# Patient Record
Sex: Female | Born: 1935 | Race: White | Hispanic: No | Marital: Married | Smoking: Never smoker
Health system: Southern US, Community
[De-identification: ages and names within clinical notes are randomized; demographics above are authoritative.]

## PROBLEM LIST (undated history)

## (undated) DIAGNOSIS — R58 Hemorrhage, not elsewhere classified: Secondary | ICD-10-CM

## (undated) DIAGNOSIS — Z9981 Dependence on supplemental oxygen: Secondary | ICD-10-CM

## (undated) DIAGNOSIS — R42 Dizziness and giddiness: Secondary | ICD-10-CM

## (undated) DIAGNOSIS — F32A Depression, unspecified: Secondary | ICD-10-CM

## (undated) DIAGNOSIS — K219 Gastro-esophageal reflux disease without esophagitis: Secondary | ICD-10-CM

## (undated) DIAGNOSIS — N189 Chronic kidney disease, unspecified: Secondary | ICD-10-CM

## (undated) DIAGNOSIS — I2510 Atherosclerotic heart disease of native coronary artery without angina pectoris: Secondary | ICD-10-CM

## (undated) DIAGNOSIS — I4892 Unspecified atrial flutter: Secondary | ICD-10-CM

## (undated) DIAGNOSIS — E039 Hypothyroidism, unspecified: Secondary | ICD-10-CM

## (undated) DIAGNOSIS — F329 Major depressive disorder, single episode, unspecified: Secondary | ICD-10-CM

## (undated) DIAGNOSIS — Z79899 Other long term (current) drug therapy: Secondary | ICD-10-CM

## (undated) DIAGNOSIS — D649 Anemia, unspecified: Secondary | ICD-10-CM

## (undated) DIAGNOSIS — I5032 Chronic diastolic (congestive) heart failure: Secondary | ICD-10-CM

## (undated) DIAGNOSIS — I4819 Other persistent atrial fibrillation: Secondary | ICD-10-CM

## (undated) DIAGNOSIS — M1990 Unspecified osteoarthritis, unspecified site: Secondary | ICD-10-CM

## (undated) DIAGNOSIS — J9690 Respiratory failure, unspecified, unspecified whether with hypoxia or hypercapnia: Secondary | ICD-10-CM

## (undated) DIAGNOSIS — I495 Sick sinus syndrome: Secondary | ICD-10-CM

## (undated) DIAGNOSIS — N289 Disorder of kidney and ureter, unspecified: Secondary | ICD-10-CM

## (undated) DIAGNOSIS — I10 Essential (primary) hypertension: Secondary | ICD-10-CM

## (undated) DIAGNOSIS — F419 Anxiety disorder, unspecified: Secondary | ICD-10-CM

## (undated) HISTORY — DX: Anemia, unspecified: D64.9

## (undated) HISTORY — DX: Other persistent atrial fibrillation: I48.19

## (undated) HISTORY — PX: KNEE ARTHROSCOPY: SUR90

## (undated) HISTORY — DX: Unspecified atrial flutter: I48.92

## (undated) HISTORY — PX: SHOULDER SURGERY: SHX246

## (undated) HISTORY — DX: Atherosclerotic heart disease of native coronary artery without angina pectoris: I25.10

## (undated) HISTORY — PX: JOINT REPLACEMENT: SHX530

## (undated) HISTORY — PX: TOTAL HIP ARTHROPLASTY: SHX124

## (undated) HISTORY — DX: Respiratory failure, unspecified, unspecified whether with hypoxia or hypercapnia: J96.90

## (undated) HISTORY — DX: Essential (primary) hypertension: I10

## (undated) HISTORY — DX: Other long term (current) drug therapy: Z79.899

## (undated) HISTORY — PX: PACEMAKER INSERTION: SHX728

## (undated) HISTORY — DX: Unspecified osteoarthritis, unspecified site: M19.90

## (undated) HISTORY — DX: Hemorrhage, not elsewhere classified: R58

## (undated) HISTORY — PX: PACEMAKER GENERATOR CHANGE: SHX5998

## (undated) HISTORY — DX: Sick sinus syndrome: I49.5

## (undated) HISTORY — DX: Depression, unspecified: F32.A

## (undated) HISTORY — DX: Major depressive disorder, single episode, unspecified: F32.9

## (undated) HISTORY — DX: Chronic diastolic (congestive) heart failure: I50.32

## (undated) HISTORY — DX: Dizziness and giddiness: R42

---

## 1997-10-06 ENCOUNTER — Other Ambulatory Visit: Admission: RE | Admit: 1997-10-06 | Discharge: 1997-10-06 | Payer: Self-pay | Admitting: Cardiology

## 1997-10-08 HISTORY — PX: CHOLECYSTECTOMY: SHX55

## 1997-10-09 ENCOUNTER — Inpatient Hospital Stay (HOSPITAL_COMMUNITY): Admission: AD | Admit: 1997-10-09 | Discharge: 1997-10-11 | Payer: Self-pay | Admitting: Cardiology

## 1999-09-06 ENCOUNTER — Encounter: Payer: Self-pay | Admitting: Family Medicine

## 1999-09-06 ENCOUNTER — Encounter: Admission: RE | Admit: 1999-09-06 | Discharge: 1999-09-06 | Payer: Self-pay | Admitting: Family Medicine

## 2000-01-14 ENCOUNTER — Encounter: Payer: Self-pay | Admitting: Interventional Cardiology

## 2000-01-14 ENCOUNTER — Ambulatory Visit (HOSPITAL_COMMUNITY): Admission: RE | Admit: 2000-01-14 | Discharge: 2000-01-14 | Payer: Self-pay | Admitting: Interventional Cardiology

## 2000-01-18 ENCOUNTER — Emergency Department (HOSPITAL_COMMUNITY): Admission: EM | Admit: 2000-01-18 | Discharge: 2000-01-19 | Payer: Self-pay | Admitting: Emergency Medicine

## 2000-09-11 ENCOUNTER — Encounter: Admission: RE | Admit: 2000-09-11 | Discharge: 2000-09-11 | Payer: Self-pay | Admitting: Family Medicine

## 2000-09-11 ENCOUNTER — Encounter: Payer: Self-pay | Admitting: Family Medicine

## 2001-09-17 ENCOUNTER — Encounter: Payer: Self-pay | Admitting: Family Medicine

## 2001-09-17 ENCOUNTER — Encounter: Admission: RE | Admit: 2001-09-17 | Discharge: 2001-09-17 | Payer: Self-pay | Admitting: Family Medicine

## 2002-05-30 ENCOUNTER — Encounter: Admission: RE | Admit: 2002-05-30 | Discharge: 2002-08-28 | Payer: Self-pay | Admitting: Family Medicine

## 2002-09-25 ENCOUNTER — Encounter: Payer: Self-pay | Admitting: Family Medicine

## 2002-09-25 ENCOUNTER — Encounter: Admission: RE | Admit: 2002-09-25 | Discharge: 2002-09-25 | Payer: Self-pay | Admitting: Family Medicine

## 2002-09-25 LAB — HM MAMMOGRAPHY: HM Mammogram: NEGATIVE

## 2003-01-04 ENCOUNTER — Inpatient Hospital Stay (HOSPITAL_COMMUNITY): Admission: EM | Admit: 2003-01-04 | Discharge: 2003-01-13 | Payer: Self-pay | Admitting: Emergency Medicine

## 2003-01-04 ENCOUNTER — Encounter: Payer: Self-pay | Admitting: Anesthesiology

## 2003-01-04 ENCOUNTER — Encounter: Payer: Self-pay | Admitting: Family Medicine

## 2003-01-04 ENCOUNTER — Encounter: Payer: Self-pay | Admitting: Emergency Medicine

## 2003-01-05 ENCOUNTER — Encounter: Payer: Self-pay | Admitting: Surgery

## 2003-05-10 DIAGNOSIS — K683 Retroperitoneal hematoma: Secondary | ICD-10-CM

## 2003-05-10 DIAGNOSIS — R58 Hemorrhage, not elsewhere classified: Secondary | ICD-10-CM

## 2003-05-10 HISTORY — DX: Hemorrhage, not elsewhere classified: R58

## 2003-05-10 HISTORY — DX: Retroperitoneal hematoma: K68.3

## 2003-05-28 ENCOUNTER — Inpatient Hospital Stay (HOSPITAL_COMMUNITY): Admission: AD | Admit: 2003-05-28 | Discharge: 2003-06-02 | Payer: Self-pay | Admitting: Interventional Cardiology

## 2003-07-30 ENCOUNTER — Encounter: Admission: RE | Admit: 2003-07-30 | Discharge: 2003-07-30 | Payer: Self-pay | Admitting: Family Medicine

## 2004-02-16 ENCOUNTER — Encounter: Admission: RE | Admit: 2004-02-16 | Discharge: 2004-02-16 | Payer: Self-pay | Admitting: Gastroenterology

## 2004-04-16 ENCOUNTER — Ambulatory Visit (HOSPITAL_COMMUNITY): Admission: RE | Admit: 2004-04-16 | Discharge: 2004-04-16 | Payer: Self-pay | Admitting: Gastroenterology

## 2005-07-06 ENCOUNTER — Encounter: Admission: RE | Admit: 2005-07-06 | Discharge: 2005-07-06 | Payer: Self-pay | Admitting: Family Medicine

## 2005-10-10 ENCOUNTER — Ambulatory Visit: Payer: Self-pay | Admitting: Family Medicine

## 2005-10-25 ENCOUNTER — Inpatient Hospital Stay (HOSPITAL_COMMUNITY): Admission: RE | Admit: 2005-10-25 | Discharge: 2005-10-28 | Payer: Self-pay | Admitting: Orthopedic Surgery

## 2006-02-14 ENCOUNTER — Ambulatory Visit: Payer: Self-pay | Admitting: Family Medicine

## 2006-10-17 ENCOUNTER — Ambulatory Visit (HOSPITAL_COMMUNITY): Admission: RE | Admit: 2006-10-17 | Discharge: 2006-10-17 | Payer: Self-pay | Admitting: Cardiology

## 2006-10-25 ENCOUNTER — Ambulatory Visit: Payer: Self-pay | Admitting: Family Medicine

## 2006-11-21 ENCOUNTER — Inpatient Hospital Stay (HOSPITAL_COMMUNITY): Admission: EM | Admit: 2006-11-21 | Discharge: 2006-11-29 | Payer: Self-pay | Admitting: Emergency Medicine

## 2006-11-27 ENCOUNTER — Ambulatory Visit: Payer: Self-pay | Admitting: Physical Medicine & Rehabilitation

## 2006-11-27 ENCOUNTER — Encounter (INDEPENDENT_AMBULATORY_CARE_PROVIDER_SITE_OTHER): Payer: Self-pay | Admitting: General Surgery

## 2006-11-29 ENCOUNTER — Inpatient Hospital Stay (HOSPITAL_COMMUNITY)
Admission: RE | Admit: 2006-11-29 | Discharge: 2006-12-13 | Payer: Self-pay | Admitting: Physical Medicine & Rehabilitation

## 2006-12-23 ENCOUNTER — Inpatient Hospital Stay (HOSPITAL_COMMUNITY): Admission: RE | Admit: 2006-12-23 | Discharge: 2006-12-25 | Payer: Self-pay | Admitting: Orthopaedic Surgery

## 2007-01-26 ENCOUNTER — Ambulatory Visit (HOSPITAL_COMMUNITY): Admission: RE | Admit: 2007-01-26 | Discharge: 2007-01-26 | Payer: Self-pay | Admitting: Orthopaedic Surgery

## 2007-03-27 ENCOUNTER — Encounter: Admission: RE | Admit: 2007-03-27 | Discharge: 2007-03-27 | Payer: Self-pay | Admitting: Interventional Cardiology

## 2007-06-08 ENCOUNTER — Ambulatory Visit: Payer: Self-pay | Admitting: Family Medicine

## 2007-09-25 ENCOUNTER — Ambulatory Visit: Payer: Self-pay | Admitting: Family Medicine

## 2007-09-26 ENCOUNTER — Encounter: Admission: RE | Admit: 2007-09-26 | Discharge: 2007-09-26 | Payer: Self-pay | Admitting: Pediatrics

## 2008-01-15 IMAGING — CR DG CHEST 2V
2 series · 2 of 2 positions shown · non-contrast
Comparison: 12/11/2006

Addendum Begins
The patient is noted to have a CT scan of the chest of 11/21/2006. The lungs were
relatively clear that time. Given the essentially clear lungs at that time, the
findings on today's study are now felt to be more likely related to sequelae of
patchy infectious or inflammatory airspace disease. There is no substantial
change since 12/03/2006.
Addendum Ends
CLINICAL DATA: Preop evaluation for right patella surgery

[view not recorded (1 of 2)]
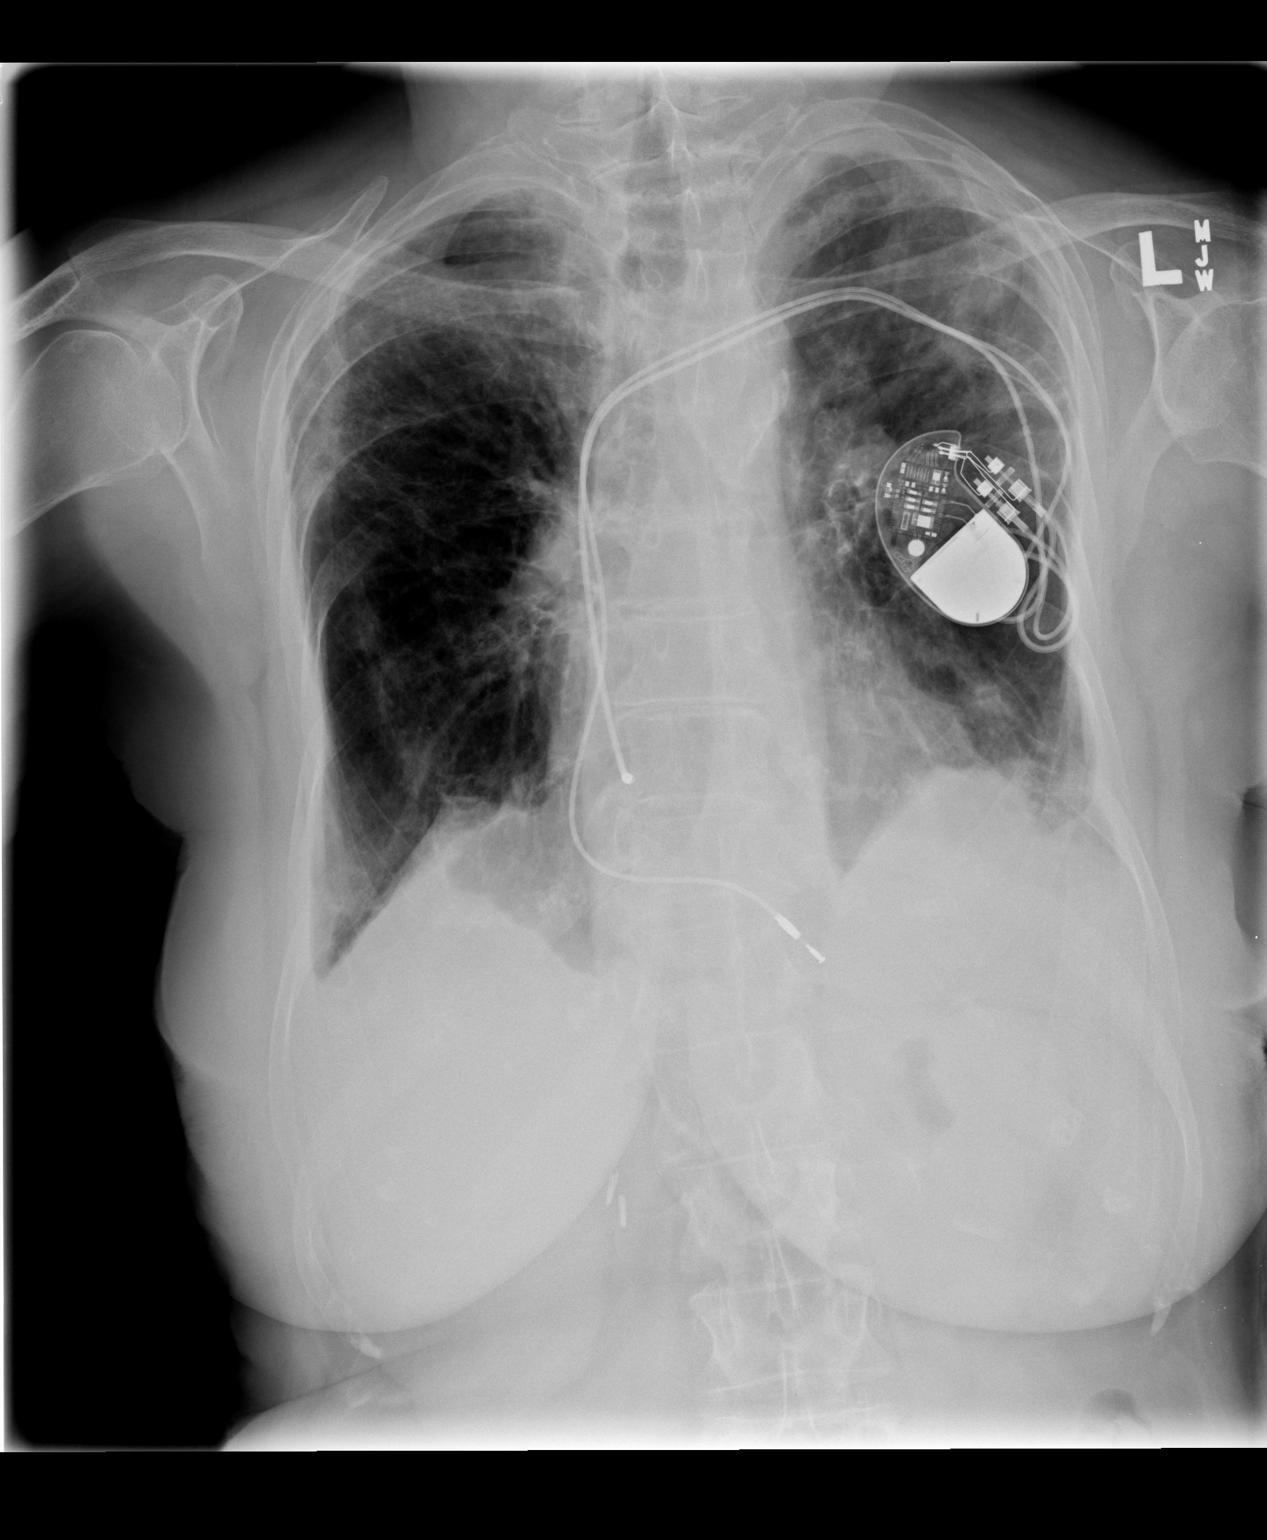

[view not recorded (2 of 2)]
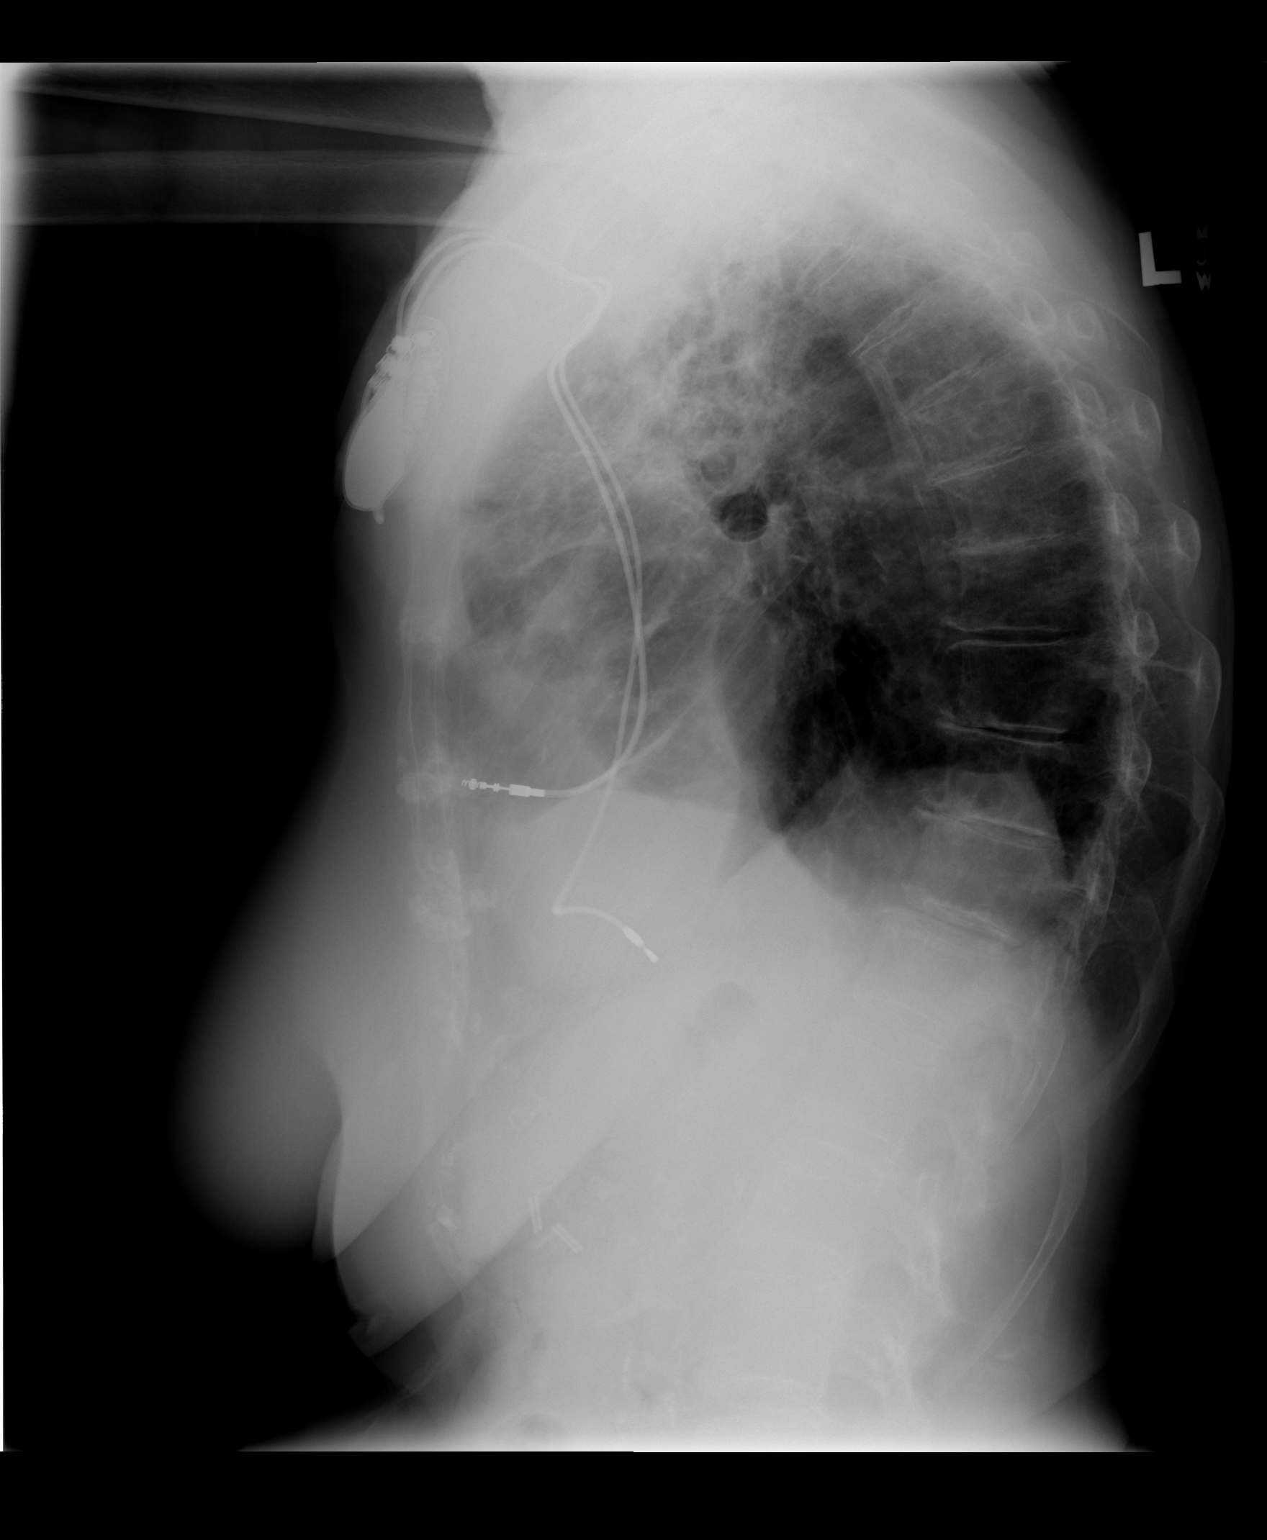

[2 of 2 positions shown; findings below may reference images not displayed]

CHEST - 2 VIEW:

Two view chest shows hyperexpansion with diffuse chronic interstitial and
pleural-parenchymal opacity. Given the relative stability of these changes since
12/03/2006, the bulk of the opacity is probably related to chronic interstitial
and pleural-parenchymal scarring. Superimposed infiltrate cannot be excluded.
The left permanent pacemaker remains in place.
IMPRESSION: Emphysema with pleural-parenchymal scarring bilaterally. No definite focal
infiltrate.

## 2008-09-16 ENCOUNTER — Ambulatory Visit: Payer: Self-pay | Admitting: Family Medicine

## 2008-10-31 ENCOUNTER — Ambulatory Visit: Payer: Self-pay | Admitting: Family Medicine

## 2008-11-21 ENCOUNTER — Ambulatory Visit: Payer: Self-pay | Admitting: Family Medicine

## 2009-06-09 ENCOUNTER — Ambulatory Visit: Payer: Self-pay | Admitting: Family Medicine

## 2009-08-27 ENCOUNTER — Ambulatory Visit: Payer: Self-pay | Admitting: Family Medicine

## 2010-01-14 ENCOUNTER — Ambulatory Visit: Payer: Self-pay | Admitting: Family Medicine

## 2010-04-24 ENCOUNTER — Emergency Department (HOSPITAL_COMMUNITY)
Admission: EM | Admit: 2010-04-24 | Discharge: 2010-04-24 | Payer: Self-pay | Source: Home / Self Care | Admitting: Emergency Medicine

## 2010-04-30 ENCOUNTER — Ambulatory Visit (HOSPITAL_COMMUNITY)
Admission: RE | Admit: 2010-04-30 | Discharge: 2010-05-01 | Payer: Self-pay | Source: Home / Self Care | Attending: Orthopedic Surgery | Admitting: Orthopedic Surgery

## 2010-05-30 ENCOUNTER — Encounter: Payer: Self-pay | Admitting: Family Medicine
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
## 2010-07-19 LAB — COMPREHENSIVE METABOLIC PANEL
AST: 176 U/L — ABNORMAL HIGH (ref 0–37)
Albumin: 3.2 g/dL — ABNORMAL LOW (ref 3.5–5.2)
BUN: 45 mg/dL — ABNORMAL HIGH (ref 6–23)
Calcium: 8.9 mg/dL (ref 8.4–10.5)
Creatinine, Ser: 2.09 mg/dL — ABNORMAL HIGH (ref 0.4–1.2)
GFR calc Af Amer: 27 mL/min — ABNORMAL LOW (ref >60)
Total Bilirubin: 0.7 mg/dL (ref 0.3–1.2)
Total Protein: 6.3 g/dL (ref 6.0–8.3)

## 2010-07-19 LAB — URINALYSIS, ROUTINE W REFLEX MICROSCOPIC
Glucose, UA: NEGATIVE mg/dL
Ketones, ur: NEGATIVE mg/dL
Protein, ur: NEGATIVE mg/dL
Urobilinogen, UA: 0.2 mg/dL (ref 0.0–1.0)

## 2010-07-19 LAB — HEPATIC FUNCTION PANEL
ALT: 160 U/L — ABNORMAL HIGH (ref 0–35)
Indirect Bilirubin: 0.6 mg/dL (ref 0.3–0.9)
Total Protein: 5.8 g/dL — ABNORMAL LOW (ref 6.0–8.3)

## 2010-07-19 LAB — CBC
MCH: 30.7 pg (ref 26.0–34.0)
MCHC: 33.2 g/dL (ref 30.0–36.0)
MCV: 92.3 fL (ref 78.0–100.0)
Platelets: 278 10*3/uL (ref 150–400)
RBC: 3.75 MIL/uL — ABNORMAL LOW (ref 3.87–5.11)
RDW: 16 % — ABNORMAL HIGH (ref 11.5–15.5)

## 2010-07-19 LAB — PROTIME-INR: INR: 1.12 (ref 0.00–1.49)

## 2010-07-19 LAB — SURGICAL PCR SCREEN: Staphylococcus aureus: NEGATIVE

## 2010-07-19 LAB — APTT: aPTT: 30 seconds (ref 24–37)

## 2010-07-19 LAB — URINE MICROSCOPIC-ADD ON

## 2010-07-20 ENCOUNTER — Ambulatory Visit (INDEPENDENT_AMBULATORY_CARE_PROVIDER_SITE_OTHER): Payer: MEDICARE | Admitting: Family Medicine

## 2010-07-20 DIAGNOSIS — R5381 Other malaise: Secondary | ICD-10-CM

## 2010-07-20 DIAGNOSIS — E039 Hypothyroidism, unspecified: Secondary | ICD-10-CM

## 2010-07-20 DIAGNOSIS — Z79899 Other long term (current) drug therapy: Secondary | ICD-10-CM

## 2010-07-20 DIAGNOSIS — N390 Urinary tract infection, site not specified: Secondary | ICD-10-CM

## 2010-07-29 ENCOUNTER — Other Ambulatory Visit (INDEPENDENT_AMBULATORY_CARE_PROVIDER_SITE_OTHER): Payer: MEDICARE

## 2010-07-29 DIAGNOSIS — N390 Urinary tract infection, site not specified: Secondary | ICD-10-CM

## 2010-08-09 ENCOUNTER — Ambulatory Visit (INDEPENDENT_AMBULATORY_CARE_PROVIDER_SITE_OTHER): Payer: MEDICARE | Admitting: Family Medicine

## 2010-08-09 DIAGNOSIS — R5383 Other fatigue: Secondary | ICD-10-CM

## 2010-08-09 DIAGNOSIS — R748 Abnormal levels of other serum enzymes: Secondary | ICD-10-CM

## 2010-09-13 ENCOUNTER — Institutional Professional Consult (permissible substitution) (INDEPENDENT_AMBULATORY_CARE_PROVIDER_SITE_OTHER): Payer: MEDICARE | Admitting: Family Medicine

## 2010-09-13 DIAGNOSIS — R42 Dizziness and giddiness: Secondary | ICD-10-CM

## 2010-09-13 DIAGNOSIS — I10 Essential (primary) hypertension: Secondary | ICD-10-CM

## 2010-09-13 DIAGNOSIS — Z79899 Other long term (current) drug therapy: Secondary | ICD-10-CM

## 2010-09-13 DIAGNOSIS — R5383 Other fatigue: Secondary | ICD-10-CM
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
## 2010-09-21 NOTE — Op Note (Signed)
[name], [name]              ACCOUNT NO.:  [id number]   MEDICAL RECORD NO.:  [id number]          PATIENT TYPE:  OIB   LOCATION:  2899                         FACILITY:  [facility]   PHYSICIAN:  Francisca December, M.D.  DATE OF BIRTH:  1919-09-03   DATE OF PROCEDURE:  10/17/2006  DATE OF DISCHARGE:                               OPERATIVE REPORT   PROCEDURE PERFORMED:  1. Explant old pacing generator.  2. Reimplant new dual chamber pacemaker.  3. Pacemaker lead threshold testing.   INDICATION:  ERI on a St. Jude model number [id number] implanted October 18, 1997.   PROCEDURE NOTE:  The patient is brought to the cardiac catheterization  laboratory where the left prepectoral region was prepped and draped in  the usual sterile fashion.  Local anesthesia was obtained with  infiltration of 1% lidocaine with epinephrine throughout the prepectoral  region.  A 6-7 cm incision was then made over the old surgical site.  This was carried down by sharp and blunt dissection to the pacemaker  capsule.  This was incised and the pacemaker was delivered without  extensive difficulty.  The leads were detached from the pacing  generator.  Each lead was tested for adequate pacing parameters and this  will be reported below.   The pocket was then copiously irrigated using 1% kanamycin solution.  The leads were then attached to the new pacing generator, carefully  identifying each by its serial number, and placing each into the  appropriate receptacle.  Each lead was tightened into place and tested  for security.  The pacing generator was then replaced in the pocket and  an 0 silk anchoring suture applied.  The wound was then closed using 2-0  Vicryl in a running fashion for the subcutaneous layer.  The skin was  approximated using 4-0 Vicryl in a running subcuticular fashion.  Steri-  Strips and a sterile dressing were applied.  The patient was transported  to the recovery area in stable condition.   EQUIPMENT DATA:  The new pacing generator is a [employer]. Jude Zephyr XL DR,  model number [id number], serial number [id number].   PACING DATA:  The ventricular lead detected a 2.2 mV R wave.  The pacing  threshold was 0.8 volts at 0.5 milliseconds pulse width.  The impedance  was 530 ohms resulting in a current at capture threshold of 1.5 MA.  The  atrial lead detected a 2.1 mV P wave.  The pacing threshold 0.8 volts at  0.5 milliseconds pulse width.  The impedance was 373 ohms resulting in a  current at capture threshold of 2.1 MA.      Francisca December, M.D.  Electronically Signed     JHE/MEDQ  D:  10/17/2006  T:  10/17/2006  Job:  [id number]
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
## 2010-09-23 ENCOUNTER — Telehealth: Payer: Self-pay | Admitting: Family Medicine
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
## 2010-09-24 NOTE — Op Note (Signed)
[name], [name]              ACCOUNT NO.:  [id number]   MEDICAL RECORD NO.:  [id number]          PATIENT TYPE:  INP   LOCATION:  0009                         FACILITY:  [facility]   PHYSICIAN:  Danielle Frankel. Charlann Melton, M.D.  DATE OF BIRTH:  30-Oct-1919   DATE OF PROCEDURE:  10/25/2005  DATE OF DISCHARGE:                                 OPERATIVE REPORT   PREOPERATIVE DIAGNOSIS:  Right hip osteoarthritis.   POSTOPERATIVE DIAGNOSIS:  Right hip osteoarthritis.   PROCEDURE:  Right total hip replacement.   COMPONENTS USED:  A DePuy hybrid total hip replacement with a size 54  Pinnacle cup, 32 neutral Marathon liner, a size 4 Summit cemented stem with  a cement centralizer and cement restrictor placed deep with a 32 +5 ball.   SURGEON:  Danielle Frankel. Charlann Melton, M.D.   ASSISTANT:  Georges Lynch. Darrelyn Hillock, M.D.   ANESTHESIA:  General.   BLOOD LOSS:  300.   DRAINS:  Drains x1.   COMPLICATIONS:  None.   INDICATION FOR PROCEDURE:  [name] is a very pleasant 77 year old  female referred for surgical consideration of the right hip pain.  Radiographs revealed end-stage degenerative with severe osteophytes present  over the inferior neck as well as the medial aspect of the acetabulum.  Her  pain had progressed to the point where no conservative measures provided any  relief.  Medical clearance was obtained in order to improve quality of life.  Consent was obtained after reviewing risks of infection, DVT, dislocation,  component failure, need for revision surgery, or neurovascular injury.   PROCEDURE IN DETAIL:  The patient was brought to the operative theater.  Once adequate anesthesia and preoperative antibiotics, 1 gram of Ancef, were  administered, the patient was positioned in the left lateral decubitus  position with the right side up.  The right lower extremity was then prepped  and draped in a sterile fashion following a pre-scrub.  A lateral-based  incision was made for posterior approach to  the hip.  Short external  rotators were taken down separate from the posterior capsule; the capsule  was saved for later repair.  The hip was dislocated and noted to have severe  osteoarthritis with significant osteophytes off the inferior aspect and  neck.  Using a hip guide based off the tip of the trochanter, neck osteotomy  was made.  The patient's center of head was noted to be close of the tip of  the trochanter.  Femoral preparation was carried out first to determine how  much anteversion could be placed with a cemented stem.  I prepared the canal  with initial hand canal T-handle reaming, irrigation for fat emboli followed  by lateralized reaming.  I then broached up to a 4 without complication.  Approximately 25 degrees of anteversion was placed.   Following this, a cement restrictor was placed deep and measured off the  broach.  I then went to the acetabulum.  Acetabulum preparation was carried  out following the labrectomy.  I reamed initially through the medial wall  osteophyte down to the medial wall.  I then reamed up  sequentially to a 53  reamer, which had excellent purchase and bony bed preparation.  I then  impacted a 54 Pinnacle cup at 35 degrees of abduction and 20 degrees of  forward flexion.  Two cancellous bone screws were placed to help secure this  initial scratch fit.  Trial cup was positioned, a 32 neutral.  Attention was  then redirected back to the femur, where a trial component was placed.  I  then did a trial reduction and was very happy with the stability.  There was  perhaps a little bit of laxity in extension and for that reason, I trialed  the +5 ball.  Hip stability was excellent with flexion and internal  rotation, extension and external rotation, as well as in the sleep position.  Leg lengths appeared equal to the down leg as compared to the preoperative  state prior to the patient being draped.   Following this, the trial components were removed.  The  final acetabular  liner was positioned based on the stability and the component position.  I  then prepared the canal for third generation cement technique with pulse  lavage irrigation and brushing.  The canal was then packed while cement was  mixed.  Once the cement was mixed, the femoral component was positioned in  25 degrees of anteversion, holding it out of any potential varus.  Once the  cement had cured, excessive cement was debrided and I retrialed the  components and felt very good with a +5 ball.  The final 32 +5 ball was  impacted on the cleaned trunnion and the hip reduced.  The hip was irrigated  throughout the case and at this point, any hemostasis necessary was carried  out this point.  The posterior capsular tissue was reapproximated to the  superior leaflet with #1 Ethibond and the remainder of the wound was closed  in layers with #1 Vicryl in the iliotibial band and gluteus fascia.  Then a  4-0 Monocryl was used on the skin.  Note that a Hemovac drain was placed  deep.  The patient then had her hip cleaned, dried and dressed sterilely  with Steri-Strips, dressing sponges and tape.  She was awoken from  anesthesia and brought to recovery room in stable condition.      [name], M.D.  Electronically Signed     MDO/MEDQ  D:  10/25/2005  T:  10/26/2005  Job:  [id number]

## 2010-09-24 NOTE — Op Note (Signed)
[name], [name]              ACCOUNT NO.:  [id number]   MEDICAL RECORD NO.:  [id number]          PATIENT TYPE:  AMB   LOCATION:  ENDO                         FACILITY:  [facility]   PHYSICIAN:  Anselmo Rod, M.D.  DATE OF BIRTH:  1919-11-28   DATE OF PROCEDURE:  04/16/2004  DATE OF DISCHARGE:                                 OPERATIVE REPORT   PROCEDURE:  Screening colonoscopy.   ENDOSCOPIST:  Charna Elizabeth, M.D.   INSTRUMENT USED:  Olympus video colonoscope (adjustable pediatric scope).   INDICATIONS FOR PROCEDURE:  77 year old white female with a history of  abdominal pain, occasional BRBPR, undergoing screening colonoscopy.  The  patient has a history of atrial fibrillation and has been taken off her  Coumadin since she had spontaneous mesenteric bleed resulting in  hemoperitoneum for which surgery was done in August 2004.  Rule out colonic  polyps, masses, etc.   PREPROCEDURE PREPARATION:  Informed consent was procured from the patient.  The patient was fasted for eight hours prior to the procedure and prepped  with a bottle of magnesium citrate and a gallon of GoLYTELY the night prior  to the procedure.  The risks and benefits of the procedure including a 10%  missed rate of cancer and polyps were discussed with the patient, as well.   PREPROCEDURE PHYSICAL:  Patient with stable vital signs.  Neck supple.  Chest clear to auscultation.  S1 and S2 regular (paced beats).  Pacemaker  present on the anterior aspect of the chest.  Abdomen soft with normal bowel  sounds.  Surgical scars present.  No hepatosplenomegaly appreciated.   DESCRIPTION OF PROCEDURE:  The patient was placed in the left lateral  decubitus position, sedated with 40 mg of Demerol and 5 mg Versed in slow  incremental doses.  Once the patient was adequately sedated, maintained on  low flow oxygen and continuous cardiac monitoring, the Olympus video  colonoscope was advanced from the rectum to the cecum.  The  appendiceal  orifice and ileocecal valve were clearly visualized and photographed.  Early  sigmoid diverticulum was seen.  Small internal hemorrhoids were seen on  retroflexion.  No masses or polyps were identified.  The patient tolerated  the procedure well without without complications.   IMPRESSION:  1.  Early sigmoid diverticulosis.  2.  Small internal hemorrhoids.  3.  No masses or polyps seen.   RECOMMENDATIONS:  1.  Continue a high fiber diet with liberal fluid intake.  2.  Brochures on diverticulosis have been given to the patient for      education.  3.  Outpatient follow up as need arises in the future.      Jyot   JNM/MEDQ  D:  04/16/2004  T:  04/16/2004  Job:  [id number]   cc:   Lyn Records III, M.D.  [address]  [address]  [place]  [place] [address]  Fax: [phone]

## 2010-09-24 NOTE — Discharge Summary (Signed)
[name], [name]                        ACCOUNT NO.:  [id number]   MEDICAL RECORD NO.:  [id number]                   PATIENT TYPE:  INP   LOCATION:  4730                                 FACILITY:  [facility]   PHYSICIAN:  Lyn Records, M.D.                DATE OF BIRTH:  1919/06/12   DATE OF ADMISSION:  05/28/2003  DATE OF DISCHARGE:  06/02/2003                                 DISCHARGE SUMMARY   PRIMARY CARE PHYSICIAN:  Sharlot Gowda, M.D.   HISTORY OF PRESENT ILLNESS:  The patient is an 77 year old female with  history of atrial fibrillation, recently on sotalol with maintenance of  sinus rhythm for about six months, previously on Coumadin, but this  medication had to be discontinued in August of 2004 secondary to life  threatening spontaneous mesenteric bleed requiring packed red blood cells.  She also underwent exploratory laparotomy and no significant GI  abnormalities were found during this time.  Since November of 2004, she has  been experiencing symptoms of weakness and lightheadedness initially thought  to be due to Lotrel.  This dosage was decreased and she continued to  experience intermittent symptoms of the same weakness and lightheadedness.  By early January these symptoms had become more pronounced with dizziness,  weakness, near syncope, more severe lightheadedness, heaviness in the legs,  and excessive fatigue.  There was some concern that these symptoms were  secondary to sotalol so this was decreased and subsequently discontinued.  Five days later, the patient developed palpitations, EKG was checked and the  patient was found to be in atrial fibrillation.  Based on the above, the  patient has been admitted for amiodarone loading.  On admission, the  patient's blood pressure was normal at 122/70, heart rate was 80 and  regular. Examination otherwise was unremarkable except for rate of her  heartbeat.  Labs and diagnostics were pending at time of initial note.   An  EKG showed atrial fibrillation with controlled ventricular rate.   ADMISSION DIAGNOSIS:  1. Atrial fibrillation with controlled ventricular rate, presumed     intolerance to sotalol.  2. Amiodarone loading.  3. Hypertension.  4. History of permanent pacemaker secondary to sick sinus syndrome and     tachy/brady syndrome.   HOSPITAL COURSE:  Problem 1.  The patient was admitted to the telemetry unit  where TSH, routine labs, pulmonary function studies were started.  She was  started on amiodarone 400 mg p.o. q.8h with daily EKG's.  QTC's were  monitored daily and remained less than 500 milliseconds.  The patient  remained in atrial fibrillation with good rate control throughout the  hospitalization.  Dr. Katrinka Blazing was concerned that if she should convert, she  was at increased risk for CVA and although, she had a bleed on Coumadin, he  felt it best to go ahead and at least initiate IV heparin. This could be  rapidly reversed if  any problems with bleeding did occur.  After about 36  hours, the patient remained in atrial fibrillation and there was some  concern that she would not convert with amiodarone, therefore, Dr. Katrinka Blazing  opted to place the patient on the cardioversion schedule for Monday, June 02, 2003.  Fortunately, the patient converted to normal sinus rhythm over  the weekend, so the cardioversion was canceled and the patient was  discharged on Monday, June 02, 2003.   FINAL DIAGNOSES:  1. Atrial fibrillation with controlled ventricular rate, now converted to     sinus rhythm.  2. History of sick sinus syndrome and tachy/brady syndrome, status post     permanent pacemaker.  3. Continue weakness and feeling bad, unclear etiology.  4. Hypertension.  5. History of spontaneous mesenteric bleed in 2004 secondary to Coumadin     therapy.   DISCHARGE MEDICATIONS:  1. Amiodarone 200 mg one twice a day until June 16, 2003, and one once a     day.  2. Lotrel 5/10  decreased to one per day.  3. Atenolol 50 mg daily.  4. Enteric-coated aspirin 81 mg daily.  5. Hydrochlorothiazide 12.5 mg daily.  6. Ativan 0.5 mg 1/2 tablet twice daily p.r.n. for anxiety.   ACTIVITY:  As tolerated.   DIET:  Low salt.   DISCHARGE INSTRUCTIONS:  Continue as before, but Dr. Katrinka Blazing has written on  the discharge sheet that she should consider a geriatric assessment at [facility] and that our office will call with information  regarding this type of appointment.  Otherwise, she is to follow up with Dr.  Katrinka Blazing with one month. She has an appointment scheduled for Monday, February  21, at 1:45 p.m.      Allison L. Rennis Harding, N.P.                    Lyn Records, M.D.    ALE/MEDQ  D:  06/24/2003  T:  06/24/2003  Job:  [id number]

## 2010-09-24 NOTE — Op Note (Signed)
   NAME:  [name], [name]                        ACCOUNT NO.:  [id number]   MEDICAL RECORD NO.:  [id number]                   PATIENT TYPE:  INP   LOCATION:  1823                                 FACILITY:  [facility]   PHYSICIAN:  Thornton Park. Daphine Deutscher, M.D.             DATE OF BIRTH:  [birth year]   DATE OF PROCEDURE:  01/04/2003  DATE OF DISCHARGE:                                 OPERATIVE REPORT   PREOPERATIVE DIAGNOSIS:  Hemoperitoneum and shock.   POSTOPERATIVE DIAGNOSIS:  Hemoperitoneum and shock from spontaneous  mesenteric bleed, probably secondary to anticoagulation.   SURGEON:  Thornton Park. Daphine Deutscher, M.D.   ASSISTANT:  Sandria Bales. Ezzard Standing, M.D.   INDICATIONS:  [name] is an 77 year old lady who was seen in the [facility]  emergency department with a CT scan which I reviewed and showed a  hemoperitoneum and the patient was in shock.  Informed consent was obtained  regarding laparotomy.   DESCRIPTION OF PROCEDURE:  The patient was taken to Room #16 and abdomen was  prepped with Betadine and draped sterilely.  Midline incision was made and  encountered marked hemoperitoneum.  Sucker was inserted and two liters of  blood withdrawn as well as clots.  The focus seemed to be in the retro,  ascending, hepatic flexure region of the colon, around the duodenum, up in  the mesentery.  I explored this area and evacuated the clot.  There was no  active bleeder noted at that point.  She had received fresh frozen plasma  and presumably her INR had been corrected at that point.  Blood was  evacuated.  I ran her small bowel and palpated her colon, looked at her  spleen, looked at her liver.  An NG tube was left in her stomach.  The  mesentery that had been torn open during the hemorrhage was oversewn with  running 3-0 Vicryl.   The wound was closed with running #1 PDS from above and below.  A single  middle retention suture was placed.  The patient seemed to tolerate the  procedure well.                                       Thornton Park Daphine Deutscher, M.D.    MBM/MEDQ  D:  01/04/2003  T:  01/05/2003  Job:  [id number]   cc:   Lyn Records III, M.D.  [address]  [address]  [place]  [place] [address]  Fax: [phone]   Sharlot Gowda, M.D.  [address] [place], [place] [address]  Fax: [phone]

## 2010-09-24 NOTE — H&P (Signed)
NAMEELLIE, BRYAND              ACCOUNT NO.:  [id number]   MEDICAL RECORD NO.:  [id number]          PATIENT TYPE:  INP   LOCATION:  NA                           FACILITY:  [facility]   PHYSICIAN:  Madlyn Frankel. Charlann Boxer, M.D.  DATE OF BIRTH:  January 19, 1920   DATE OF ADMISSION:  DATE OF DISCHARGE:                                HISTORY & PHYSICAL   CHIEF COMPLAINT:  Pain in my right hip.   HISTORY OF PRESENT ILLNESS:  This 77 year old female seen by [place] for  continuing problems concerning her right hip. She has had progressive pain  which has been resistant to conservative care. She now has difficulty with  getting about due to pain in the proximal aspect of her thigh. Examination  observed Trendelenburg gait on the right side, favoring the lower extremity.  Shortening and abductor weakness was also noted as well. Limitation of range  of motion is seen into the right hip. X-rays have show loss of joint space  with collapse of the femoral head and severe osteophyte formation with near  fusion of the right hip. This is a very active lady and is highly desirous  to continue her active lifestyle. After much discussion including the risks  and benefits of surgery, we decided this patient would benefit from surgical  intervention, being admitted for a total hip replacement arthroplasty. In  all probability, she will need to have home health versus rehabilitation and  skilled nursing, and we will certainly determine that at the end of surgery.  Should we have any medical problems, we will certainly call Dr. Susann Givens who  kindly referred this patient to [place] for any medication expertise.   ALLERGIES:  THE PATIENT IS ALLERGIC TO PROCAN, QUINIDINE, TIAZAC, PHENERGAN  AND HAS HAD A HISTORY OF A SEVERE GI BLEED WHILE ON COUMADIN IN THE PAST.   CURRENT MEDICATIONS:  1.  Lotrel 5/10 mg 1 daily.  2.  Amiodarone 200 mg daily.  3.  Atenolol 50 mg daily.  4.  Hydrochlorothiazide 12.5 mg daily.  5.  Mirtazapine 15  mg daily.  6.  Aspirin 81 mg daily (will stop prior to surgery).  7.  Folic acid  mg daily.  8.  Calcium.   PAST MEDICAL HISTORY:  She is being treated by Dr. Susann Givens for hypertension.  She has a history of atrial fibrillation and has a pacemaker. She has had  basal cell carcinomas removed from her skin.   PAST SURGICAL HISTORY:  Cardiac pacemaker placed in 1999, cholecystectomy in  the past, and had emergency abdominal surgery secondary to being given  Coumadin and a severe GI bleed in 2004.   FAMILY HISTORY:  Positive for heart disease and hypertension in her mother.   SOCIAL HISTORY:  The patient is married. She is a housewife. No intake of  alcohol or tobacco products. She has three children, and her husband and two  daughters will be care givers after surgery. She lives in a [place] home.   REVIEW OF SYSTEMS:  CENTRAL NERVOUS SYSTEM:  No seizures, stroke, paralysis,  numbness, double vision.  RESPIRATORY:  No  productive cough, no hemoptysis,  no shortness of breath. CARDIOVASCULAR:  No chest pain, no angina, no  orthopnea. GASTROINTESTINAL:  No nausea, vomiting, melena, or bloody stools.  GENITOURINARY:  No discharge, dysuria, hematuria. MUSCULOSKELETAL:  Primarily in present illness.   PHYSICAL EXAMINATION:  GENERAL:  Alert and cooperative, friendly, 85-year-  old white female whose vital signs are:  Blood pressure 120/62, pulse 80,  respirations 12.  HEENT:  Normocephalic. PERRLA. EOMs intact. Oropharynx is clear.  CHEST:  Clear to auscultation. No rhonchi, no rales.  HEART:  Regular rate and rhythm. No murmurs are heard.  ABDOMEN:  Soft, nontender. Liver and spleen not felt.  GENITAL, RECTAL, PELVIC, BREAST:  Not done, not pertinent to present  illness.  EXTREMITIES:  Right hip as in present illness above.   ADMISSION DIAGNOSES:  1.  Severe osteoarthritis of the right hip.  2.  Hypertension.  3.  History of atrial fibrillation.   PLAN:  The patient will be admitted  for right total hip replacement  arthroplasty. Again, will ask Dr. Sharlot Gowda to follow along with [place]  should this patient have any medical problems.      Dooley L. Cherlynn June.      Madlyn Frankel Charlann Boxer, M.D.  Electronically Signed    DLU/MEDQ  D:  10/18/2005  T:  10/18/2005  Job:  [id number]   cc:   Sharlot Gowda, M.D.  Fax: [phone]

## 2010-09-24 NOTE — Discharge Summary (Signed)
[name], [name]              ACCOUNT NO.:  [id number]   MEDICAL RECORD NO.:  [id number]          PATIENT TYPE:  INP   LOCATION:  1615                         FACILITY:  [facility]   PHYSICIAN:  Madlyn Frankel. Charlann Boxer, M.D.  DATE OF BIRTH:  Feb 07, 1920   DATE OF ADMISSION:  10/25/2005  DATE OF DISCHARGE:  10/28/2005                                 DISCHARGE SUMMARY   ADMISSION DIAGNOSES:  1.  Severe osteoarthritis of right hip.  2.  Hypertension.  3.  History of atrial fibrillation.   DISCHARGE DIAGNOSES:  1.  Severe osteoarthritis of right hip.  2.  Hypertension.  3.  History of atrial fibrillation.  4.  Mild postoperative anemia.   OPERATION:  On  October 25, 2005, the patient underwent DePuy hybrid total hip  replacement arthroplasty to the right hip, [name], M.D., assisted.   BRIEF HISTORY:  This 77 year old lady with continuing problems concerning  her right hip.  X-rays showed end-stage degenerative arthritis to the hip  with bone-on-bone deformity.  Conservative measures did not provide her with  any relief and so after much consideration, including the risks and benefits  of surgery, she agreed to the above procedure.   COURSE IN THE HOSPITAL:  The patient tolerated surgical procedure quite  well.  She was allowed weightbearing as tolerated on her operative hip as  the stem was cemented.  Home arrangements had been made with [place] and her  daughter to take care of her and she was anxious to go home the day of  discharge.  She was ambulating at least 80 feet in the hall with little or  no problems.  Wound was dry.  Neurovascular is intact in the operative  extremity.  She felt she could be maintained in home environments and  arrangements were made for discharge.  Laboratory values in the hospital  hematologically showed a CBC with differential completely normal  preoperatively.  Hemoglobin was 13.4, hematocrit 39.8.  Final hemoglobin of  8.6, hematocrit was 25  (asymptomatic).  Blood chemistries remained normal.  Urinalysis negative for urinary tract infection.  No chest x-ray or  electrocardiogram seen in this chart.   CONDITION ON DISCHARGE:  Improved, stable.   PLAN:  The patient discharged to home.  Continue with home medications and  diet.  Follow her medical doctor as antacids as indicated.  She is to return  to see Dr. Charlann Boxer in two weeks after date of surgery.  [name] is given for  discomfort, Coumadin protocol, [name] for a postoperative anemia, and  continue with incentive spirometer home.  She is to follow with Dr. Susann Givens  per his instructions and take Tylenol for temperature over 100.      Dooley L. Cherlynn June.      Madlyn Frankel Charlann Boxer, M.D.  Electronically Signed    DLU/MEDQ  D:  11/18/2005  T:  11/18/2005  Job:  [id number]   cc:   Sharlot Gowda, M.D.  Fax: [phone]

## 2010-09-24 NOTE — Discharge Summary (Signed)
   [name], [name]                        ACCOUNT NO.:  [id number]   MEDICAL RECORD NO.:  [id number]                   PATIENT TYPE:  INP   LOCATION:  3712                                 FACILITY:  [facility]   PHYSICIAN:  Thornton Park. Daphine Deutscher, M.D.             DATE OF BIRTH:  11-23-1919   DATE OF ADMISSION:  01/04/2003  DATE OF DISCHARGE:  01/13/2003                                 DISCHARGE SUMMARY   ADMITTING DIAGNOSIS:  Acute abdomen with hemoperitoneum.   DISCHARGE DIAGNOSIS:  Hemoperitoneum probably from spontaneous bleed  secondary to Coumadin therapy.   COURSE IN THE HOSPITAL:  [name] is an 77 year old lady who presented  to the emergency department with an expanding abdominal compartment and  abdominal pain, who went into shock.  CT scan showed a lot of blood.  She  was taken to the OR directly Sunday and underwent a laparotomy, where she  had a 2-L hemoperitoneum.  This was evacuated and it looked like she was  bleeding from a spontaneous bleeder in her right upper quadrant, probably  from the mesentery or omentum.  Her anticoagulation was reversed and she was  kept in the unit and she gradually improved until postoperative day 9, when  she was ready to go home.   FOLLOWUP:  Arrangements were made  for her to see me in the office in  followup in about one week.   CONDITION:  Improved.                                                Thornton Park Daphine Deutscher, M.D.    MBM/MEDQ  D:  02/04/2003  T:  02/04/2003  Job:  [id number]

## 2010-10-13 ENCOUNTER — Telehealth: Payer: Self-pay | Admitting: Family Medicine

## 2010-10-13 NOTE — Telephone Encounter (Signed)
PT NEEDS REFILL LEVOXYL 90 DAYS TO [facility]-FAX #[phone]

## 2010-10-14 ENCOUNTER — Other Ambulatory Visit: Payer: Self-pay | Admitting: Otolaryngology

## 2010-10-14 DIAGNOSIS — R42 Dizziness and giddiness: Secondary | ICD-10-CM

## 2010-10-18 ENCOUNTER — Ambulatory Visit
Admission: RE | Admit: 2010-10-18 | Discharge: 2010-10-18 | Disposition: A | Discharge status: Home / Self Care | Payer: MEDICARE | Source: Ambulatory Visit | Attending: Otolaryngology | Admitting: Otolaryngology

## 2010-10-18 DIAGNOSIS — R42 Dizziness and giddiness: Secondary | ICD-10-CM

## 2010-10-18 MED ORDER — IOHEXOL 300 MG/ML  SOLN
50.0000 mL | Freq: Once | INTRAMUSCULAR | Status: AC | PRN
  Administered 2010-10-18: 50 mL via INTRAVENOUS

## 2010-10-28 ENCOUNTER — Other Ambulatory Visit: Payer: Self-pay

## 2010-10-28 MED ORDER — LEVOTHYROXINE SODIUM 88 MCG PO TABS: 88.0000 ug | ORAL_TABLET | Freq: Every day | ORAL | Status: DC

## 2010-11-02 ENCOUNTER — Ambulatory Visit: Payer: Medicare Other | Attending: Otolaryngology | Admitting: Rehabilitative and Restorative Service Providers"

## 2010-11-02 DIAGNOSIS — R269 Unspecified abnormalities of gait and mobility: Secondary | ICD-10-CM | POA: Insufficient documentation

## 2010-11-02 DIAGNOSIS — IMO0001 Reserved for inherently not codable concepts without codable children: Secondary | ICD-10-CM | POA: Insufficient documentation

## 2010-11-02 DIAGNOSIS — R42 Dizziness and giddiness: Secondary | ICD-10-CM | POA: Insufficient documentation

## 2010-11-16 ENCOUNTER — Ambulatory Visit: Payer: MEDICARE | Attending: Otolaryngology | Admitting: Rehabilitative and Restorative Service Providers"

## 2010-11-16 DIAGNOSIS — IMO0001 Reserved for inherently not codable concepts without codable children: Secondary | ICD-10-CM | POA: Insufficient documentation

## 2010-11-16 DIAGNOSIS — R269 Unspecified abnormalities of gait and mobility: Secondary | ICD-10-CM | POA: Insufficient documentation

## 2010-11-16 DIAGNOSIS — R42 Dizziness and giddiness: Secondary | ICD-10-CM | POA: Insufficient documentation

## 2010-11-30 ENCOUNTER — Ambulatory Visit: Payer: MEDICARE | Admitting: Rehabilitative and Restorative Service Providers"

## 2010-12-15 ENCOUNTER — Ambulatory Visit: Payer: MEDICARE | Admitting: Rehabilitative and Restorative Service Providers"

## 2010-12-21 ENCOUNTER — Ambulatory Visit: Payer: MEDICARE | Attending: Otolaryngology | Admitting: Rehabilitative and Restorative Service Providers"

## 2010-12-21 DIAGNOSIS — IMO0001 Reserved for inherently not codable concepts without codable children: Secondary | ICD-10-CM | POA: Insufficient documentation

## 2010-12-21 DIAGNOSIS — R269 Unspecified abnormalities of gait and mobility: Secondary | ICD-10-CM | POA: Insufficient documentation

## 2010-12-21 DIAGNOSIS — R42 Dizziness and giddiness: Secondary | ICD-10-CM | POA: Insufficient documentation

## 2011-01-13 ENCOUNTER — Telehealth: Payer: Self-pay | Admitting: Family Medicine

## 2011-01-13 MED ORDER — LEVOTHYROXINE SODIUM 88 MCG PO TABS: 88.0000 ug | ORAL_TABLET | Freq: Every day | ORAL | Status: DC

## 2011-01-13 NOTE — Telephone Encounter (Signed)
Levothyroxine tab .088  Mg  1 po qd

## 2011-01-13 NOTE — Telephone Encounter (Signed)
[name] write her a letter please

## 2011-01-13 NOTE — Telephone Encounter (Signed)
Pt daughter [name] called and states need letter for mother [name].  [name] is aging and husband is blind.  They need a letter saying that their mailbox needs to be attached to their home.  Once you give them a letter, then the post office will move their mailbox.

## 2011-01-13 NOTE — Telephone Encounter (Signed)
I want to make sure you got this

## 2011-01-17 ENCOUNTER — Encounter: Payer: Self-pay | Admitting: Family Medicine

## 2011-01-17 NOTE — Telephone Encounter (Signed)
Letter sent.

## 2011-01-31 ENCOUNTER — Encounter: Payer: Self-pay | Admitting: Family Medicine

## 2011-02-01 ENCOUNTER — Ambulatory Visit (INDEPENDENT_AMBULATORY_CARE_PROVIDER_SITE_OTHER): Payer: MEDICARE | Admitting: Family Medicine

## 2011-02-01 ENCOUNTER — Ambulatory Visit
Admission: RE | Admit: 2011-02-01 | Discharge: 2011-02-01 | Disposition: A | Discharge status: Home / Self Care | Payer: MEDICARE | Source: Ambulatory Visit | Attending: Family Medicine | Admitting: Family Medicine

## 2011-02-01 ENCOUNTER — Encounter: Payer: Self-pay | Admitting: Family Medicine

## 2011-02-01 ENCOUNTER — Other Ambulatory Visit: Payer: Self-pay | Admitting: Family Medicine

## 2011-02-01 VITALS — BP 110/70 | HR 78 | Wt 130.0 lb

## 2011-02-01 DIAGNOSIS — M25562 Pain in left knee: Secondary | ICD-10-CM

## 2011-02-01 DIAGNOSIS — Z23 Encounter for immunization: Secondary | ICD-10-CM

## 2011-02-01 DIAGNOSIS — M25569 Pain in unspecified knee: Secondary | ICD-10-CM

## 2011-02-01 DIAGNOSIS — Z0000 Encounter for general adult medical examination without abnormal findings: Secondary | ICD-10-CM

## 2011-02-01 NOTE — Patient Instructions (Signed)
Keep using the walker. Go ahead and get the x-ray.  You may use 3 Advil 4 times per day for ear pain.

## 2011-02-01 NOTE — Progress Notes (Signed)
  Subjective:    Patient ID: [name], female    DOB: [birth year], 77 y.o.   MRN: [id number]  HPI She is here for evaluation of left knee pain. She did fall approximately 3 weeks ago and injured her left shoulder however did not have knee pain. This pain started abruptly 2 weeks ago. There has been no popping, locking, grinding. She is now using a walker. No other joints are involved.   Review of Systems     Objective:   Physical Exam Slight effusion is noted in the knee. Good motion of the knee with some tenderness. Medial collateral testing was uncomfortable and she points to the medial aspect of the knee is to wear the pain is. Anterior drawer is negative. McMurray's testing was uncomfortable.       Assessment & Plan:  Left knee pain I will order an x-ray and place her on Advil. Followup pending results of x-ray

## 2011-02-02 ENCOUNTER — Telehealth: Payer: Self-pay | Admitting: *Deleted

## 2011-02-02 NOTE — Telephone Encounter (Addendum)
Message copied by Dorthula Perfect on Wed Feb 02, 2011  1:25 PM ------      Message from: Laureen Ochs F      Created: Wed Feb 02, 2011  9:13 AM       Inform her that the x-ray is negative. Have her take Aleve 2 pills twice a day for the next 10 days and return here for recheck. Have her call if this is not enough to control her pain.   Pt notified of xray results.  Will take 2 Aleve BID x 10 days then will call to schedule an appointment for a recheck.  Pt agreed to call if Aleve is not enough to control pain.  CM,LPN

## 2011-02-03 ENCOUNTER — Telehealth: Payer: Self-pay

## 2011-02-03 NOTE — Telephone Encounter (Signed)
Called pt to inform of x-ray results someone called her yesterday and told her also the aleve is not helping

## 2011-02-09 ENCOUNTER — Ambulatory Visit (INDEPENDENT_AMBULATORY_CARE_PROVIDER_SITE_OTHER): Payer: MEDICARE | Admitting: Medical

## 2011-02-09 ENCOUNTER — Encounter: Payer: Self-pay | Admitting: Medical

## 2011-02-09 VITALS — BP 118/62 | HR 72 | Temp 98.1°F | Resp 16

## 2011-02-09 DIAGNOSIS — M25462 Effusion, left knee: Secondary | ICD-10-CM

## 2011-02-09 DIAGNOSIS — M25569 Pain in unspecified knee: Secondary | ICD-10-CM

## 2011-02-09 DIAGNOSIS — M25469 Effusion, unspecified knee: Secondary | ICD-10-CM

## 2011-02-09 DIAGNOSIS — M25562 Pain in left knee: Secondary | ICD-10-CM

## 2011-02-09 MED ORDER — TRAMADOL HCL 50 MG PO TABS: 50.0000 mg | ORAL_TABLET | Freq: Four times a day (QID) | ORAL | Status: DC | PRN

## 2011-02-09 NOTE — Progress Notes (Signed)
Subjective:   HPI [name] is a 77 y.o. female who presents for c/o left knee pain and swelling, worsening.  She notes that she was seen here recently by Dr. Susann Givens for same problem.  Had xray at that time, has been taking NSAID without relief.  She has tried to stay off the knee, using a walker to get around, but weight bearing and knee motion aggravates the pain.  Was using prong crane prior to last week.  Nothing helps the pain.  Has used some ice as well without relief.  She had originally fallen a few weeks ago landing on the left side and left leg.  The knee has been painful since.  She notes hx/o right knee surgery due to patellar problem, but no prior left knee surgery.  She does have a hx/o left hip arthritis.  Denies hx/o gout.  No other aggravating or relieving factors.  No other c/o.  The following portions of the patient's history were reviewed and updated as appropriate: allergies, current medications, past family history, past medical history, past social history, past surgical history and problem list.  Past Medical History  Diagnosis Date  . Hypertension   . Arthritis     RT HIP  . Osteoporosis   . Renal insufficiency   . Atrial fib/flutter, transient    Past Surgical History  Procedure Date  . Joint replacement      RT HIP REPLACEMENT  . Knee arthroscopy     right    Allergies  Allergen Reactions  . Amoxicillin   . Coumadin   . Phenergan   . Polocaine (Carbocaine)   . Procaine   . Tiazac (Diltiazem Hcl)   . Vicodin (Hydrocodone-Acetaminophen)     Review of Systems Gen.: No fever, chills, sweats, weight gain Skin: No rash Extremities: Generalized swelling of the left knee, otherwise no swelling Neuro: no numbness, tingling, weakness, no other recent fall MSK: other than left knee, UE and LE without pain or swelling     Objective:   Physical Exam  General appearance: alert, no distress, WD/WN, elderly white female Skin: medial left knee joint  line with warmth, but no erythema MSK: tender over medial left joint line worse than lateral joint line, mild swelling generalized and effusion noted medially, pain with knee flexion, mild pain with McMurray's, otherwise no joint laxity, no other tenderness, mild tenderness generalized to palpation of left upper leg, otherwise left leg nontneder;  Right leg with surgical scar vertically of knee, but nontender, normal ROM. Extremities: no palpable cords, nontender calves, no cyanosis, no lower leg edema Neuro: normal strength and sensation of legs Pulses: 1+ pedal pulses   Assessment :    Encounter Diagnoses  Name Primary?  . Knee pain, left Yes  . Knee effusion, left      Plan:     Discussed case with Dr. Susann Givens.  Reviewed knee xray from 02/01/11 with no evidence of acute or subacute fracture or dislocation, Joint spaces well preserved for age. Mild loss of bone mineral density. No evidence of a significant joint effusion. Femoropopliteal and tibioperoneal artery atherosclerosis.  Dr. Susann Givens discussed findings with patient and patient consented to knee aspiration.  Cleaned and prepped in usual sterile fashion, and 8 cc of clear yellow straw color fluid obtained by medial joint line aspiration.  Area cleaned and covered with bandage.  Fluid sent for culture, cell count and crystals.   Advised rest, ice pack 20 min 3 times daily, can elevate  the leg, and gave script for Ultram for pain.  We will call with results, and advised that she will likely need ortho consult pending results.

## 2011-02-10 LAB — SYNOVIAL CELL COUNT + DIFF, W/ CRYSTALS
Crystals, Fluid: NONE SEEN
Eosinophils-Synovial: 0 % (ref 0–1)
Monocyte/Macrophage: 29 % — ABNORMAL LOW (ref 50–90)

## 2011-02-11 ENCOUNTER — Ambulatory Visit: Payer: MEDICARE | Admitting: Family Medicine

## 2011-02-13 LAB — BODY FLUID CULTURE
Gram Stain: NONE SEEN
Gram Stain: NONE SEEN

## 2011-02-17 ENCOUNTER — Other Ambulatory Visit (HOSPITAL_COMMUNITY): Payer: Self-pay | Admitting: Specialist

## 2011-02-17 DIAGNOSIS — M1990 Unspecified osteoarthritis, unspecified site: Secondary | ICD-10-CM

## 2011-02-17 DIAGNOSIS — M25562 Pain in left knee: Secondary | ICD-10-CM

## 2011-02-17 LAB — HEPATIC FUNCTION PANEL
AST: 31
Albumin: 3.5
Alkaline Phosphatase: 101
Total Bilirubin: 0.8

## 2011-02-17 LAB — CBC
Hemoglobin: 13.5
RBC: 4.48
WBC: 10.4

## 2011-02-17 LAB — BASIC METABOLIC PANEL
Calcium: 9.4
GFR calc Af Amer: 54 — ABNORMAL LOW
GFR calc non Af Amer: 45 — ABNORMAL LOW
Sodium: 141

## 2011-02-18 LAB — BASIC METABOLIC PANEL
CO2: 27
Calcium: 9
Chloride: 105
Creatinine, Ser: 1.44 — ABNORMAL HIGH
Glucose, Bld: 99

## 2011-02-18 LAB — CBC
Hemoglobin: 12.6
MCHC: 33.4
MCV: 90.4
RDW: 17.7 — ABNORMAL HIGH

## 2011-02-21 LAB — COMPREHENSIVE METABOLIC PANEL
ALT: 60 — ABNORMAL HIGH
AST: 50 — ABNORMAL HIGH
BUN: 28 — ABNORMAL HIGH
CO2: 28
Calcium: 8 — ABNORMAL LOW
Chloride: 104
Creatinine, Ser: 1.58 — ABNORMAL HIGH
GFR calc Af Amer: 38 — ABNORMAL LOW
GFR calc non Af Amer: 31 — ABNORMAL LOW
Glucose, Bld: 88
Glucose, Bld: 90
Sodium: 140
Total Bilirubin: 1
Total Protein: 4.9 — ABNORMAL LOW

## 2011-02-21 LAB — CBC
HCT: 26 — ABNORMAL LOW
HCT: 24.3 — ABNORMAL LOW
HCT: 25.9 — ABNORMAL LOW
HCT: 27.9 — ABNORMAL LOW
HCT: 35.2 — ABNORMAL LOW
HCT: 35.9 — ABNORMAL LOW
Hemoglobin: 8.9 — ABNORMAL LOW
Hemoglobin: 10.3 — ABNORMAL LOW
Hemoglobin: 10.6 — ABNORMAL LOW
Hemoglobin: 11.6 — ABNORMAL LOW
Hemoglobin: 7.7 — CL
Hemoglobin: 8.7 — ABNORMAL LOW
Hemoglobin: 9.4 — ABNORMAL LOW
MCHC: 34.1
MCHC: 33.2
MCHC: 33.4
MCHC: 33.9
MCHC: 34.2
MCV: 88.4
MCV: 89.1
MCV: 89.7
MCV: 90.3
MCV: 90.4
MCV: 90.6
Platelets: 644 — ABNORMAL HIGH
Platelets: 742 — ABNORMAL HIGH
Platelets: 156
Platelets: 175
Platelets: 253
Platelets: ADEQUATE
RBC: 2.9 — ABNORMAL LOW
RBC: 3.43 — ABNORMAL LOW
RBC: 3.76 — ABNORMAL LOW
RBC: 3.95
RDW: 15.3 — ABNORMAL HIGH
RDW: 15.9 — ABNORMAL HIGH
RDW: 14.1 — ABNORMAL HIGH
RDW: 14.9 — ABNORMAL HIGH
RDW: 14.9 — ABNORMAL HIGH
RDW: 15.2 — ABNORMAL HIGH
RDW: 15.4 — ABNORMAL HIGH
RDW: 15.4 — ABNORMAL HIGH
WBC: 11.5 — ABNORMAL HIGH
WBC: 10.9 — ABNORMAL HIGH
WBC: 13.5 — ABNORMAL HIGH
WBC: 14.7 — ABNORMAL HIGH
WBC: 9.6

## 2011-02-21 LAB — URINE CULTURE
Colony Count: >=100000
Special Requests: NEGATIVE

## 2011-02-21 LAB — BASIC METABOLIC PANEL
BUN: 23
BUN: 19
BUN: 23
BUN: 26 — ABNORMAL HIGH
BUN: 27 — ABNORMAL HIGH
BUN: 28 — ABNORMAL HIGH
BUN: 30 — ABNORMAL HIGH
BUN: 32 — ABNORMAL HIGH
CO2: 25
CO2: 28
Calcium: 8 — ABNORMAL LOW
Calcium: 8.1 — ABNORMAL LOW
Calcium: 7.8 — ABNORMAL LOW
Calcium: 7.9 — ABNORMAL LOW
Calcium: 7.9 — ABNORMAL LOW
Calcium: 7.9 — ABNORMAL LOW
Calcium: 8.1 — ABNORMAL LOW
Chloride: 101
Chloride: 106
Chloride: 110
Creatinine, Ser: 1.38 — ABNORMAL HIGH
Creatinine, Ser: 1.54 — ABNORMAL HIGH
Creatinine, Ser: 1.2
Creatinine, Ser: 1.23 — ABNORMAL HIGH
Creatinine, Ser: 1.25 — ABNORMAL HIGH
Creatinine, Ser: 1.29 — ABNORMAL HIGH
Creatinine, Ser: 1.42 — ABNORMAL HIGH
Creatinine, Ser: 1.57 — ABNORMAL HIGH
Creatinine, Ser: 1.58 — ABNORMAL HIGH
GFR calc Af Amer: 44 — ABNORMAL LOW
GFR calc Af Amer: 38 — ABNORMAL LOW
GFR calc Af Amer: 43 — ABNORMAL LOW
GFR calc Af Amer: 48 — ABNORMAL LOW
GFR calc Af Amer: 50 — ABNORMAL LOW
GFR calc non Af Amer: 32 — ABNORMAL LOW
GFR calc non Af Amer: 36 — ABNORMAL LOW
GFR calc non Af Amer: 31 — ABNORMAL LOW
GFR calc non Af Amer: 31 — ABNORMAL LOW
GFR calc non Af Amer: 35 — ABNORMAL LOW
GFR calc non Af Amer: 39 — ABNORMAL LOW
GFR calc non Af Amer: 41 — ABNORMAL LOW
GFR calc non Af Amer: 42 — ABNORMAL LOW
GFR calc non Af Amer: 43 — ABNORMAL LOW
Glucose, Bld: 86
Glucose, Bld: 89
Glucose, Bld: 108 — ABNORMAL HIGH
Glucose, Bld: 130 — ABNORMAL HIGH
Glucose, Bld: 89
Glucose, Bld: 97
Potassium: 3.9
Potassium: 3.3 — ABNORMAL LOW
Potassium: 3.4 — ABNORMAL LOW
Potassium: 3.7
Potassium: 4.3
Sodium: 143
Sodium: 139
Sodium: 139
Sodium: 141

## 2011-02-21 LAB — URINALYSIS, ROUTINE W REFLEX MICROSCOPIC
Bilirubin Urine: NEGATIVE
Bilirubin Urine: NEGATIVE
Glucose, UA: NEGATIVE
Ketones, ur: NEGATIVE
Ketones, ur: NEGATIVE
Nitrite: NEGATIVE
Nitrite: NEGATIVE
Protein, ur: 30 — AB
Specific Gravity, Urine: 1.014
Urobilinogen, UA: 1
pH: 5.5
pH: 6.5

## 2011-02-21 LAB — CULTURE, BLOOD (ROUTINE X 2)

## 2011-02-21 LAB — CARDIAC PANEL(CRET KIN+CKTOT+MB+TROPI)
CK, MB: 1
CK, MB: 1.3
Relative Index: 0.9
Relative Index: INVALID
Total CK: 113
Total CK: 92
Troponin I: 0.05
Troponin I: 0.06

## 2011-02-21 LAB — I-STAT 8, (EC8 V) (CONVERTED LAB)
HCT: 38
Hemoglobin: 12.9
Operator id: 189501
TCO2: 22
pCO2, Ven: 26.7 — ABNORMAL LOW
pH, Ven: 7.513 — ABNORMAL HIGH

## 2011-02-21 LAB — DIFFERENTIAL
Basophils Absolute: 0
Basophils Absolute: 0
Eosinophils Relative: 2
Eosinophils Relative: 1
Lymphocytes Relative: 14
Lymphocytes Relative: 20
Lymphocytes Relative: 8 — ABNORMAL LOW
Lymphs Abs: 1.5
Lymphs Abs: 0.7
Lymphs Abs: 1
Lymphs Abs: 1.9
Monocytes Relative: 4
Monocytes Relative: 7
Neutro Abs: 8.7 — ABNORMAL HIGH
Neutro Abs: 11.4 — ABNORMAL HIGH
Neutro Abs: 15.5 — ABNORMAL HIGH
Neutro Abs: 6.9
Neutrophils Relative %: 76
Neutrophils Relative %: 85 — ABNORMAL HIGH
Neutrophils Relative %: 91 — ABNORMAL HIGH

## 2011-02-21 LAB — CROSSMATCH: Antibody Screen: NEGATIVE

## 2011-02-21 LAB — POCT I-STAT CREATININE
Creatinine, Ser: 3 — ABNORMAL HIGH
Operator id: 189501

## 2011-02-21 LAB — URINE MICROSCOPIC-ADD ON

## 2011-02-21 LAB — B-NATRIURETIC PEPTIDE (CONVERTED LAB)
Pro B Natriuretic peptide (BNP): 268 — ABNORMAL HIGH
Pro B Natriuretic peptide (BNP): 368 — ABNORMAL HIGH
Pro B Natriuretic peptide (BNP): 742 — ABNORMAL HIGH

## 2011-02-21 LAB — BLOOD GAS, ARTERIAL
Bicarbonate: 20.5
O2 Saturation: 100
pO2, Arterial: 172 — ABNORMAL HIGH

## 2011-02-21 LAB — ABO/RH: ABO/RH(D): A POS

## 2011-02-21 LAB — SAMPLE TO BLOOD BANK

## 2011-02-25 ENCOUNTER — Encounter (HOSPITAL_COMMUNITY)
Admission: RE | Admit: 2011-02-25 | Discharge: 2011-02-25 | Disposition: A | Discharge status: Home / Self Care | Payer: MEDICARE | Source: Ambulatory Visit | Attending: Specialist | Admitting: Specialist

## 2011-02-25 DIAGNOSIS — M1990 Unspecified osteoarthritis, unspecified site: Secondary | ICD-10-CM | POA: Insufficient documentation

## 2011-02-25 DIAGNOSIS — M25562 Pain in left knee: Secondary | ICD-10-CM

## 2011-02-25 DIAGNOSIS — M25569 Pain in unspecified knee: Secondary | ICD-10-CM | POA: Insufficient documentation

## 2011-02-25 MED ORDER — TECHNETIUM TC 99M MEDRONATE IV KIT
25.0000 | PACK | Freq: Once | INTRAVENOUS | Status: AC | PRN
  Administered 2011-02-25: 25 via INTRAVENOUS

## 2011-04-08 ENCOUNTER — Telehealth: Payer: Self-pay | Admitting: Family Medicine

## 2011-04-08 MED ORDER — LEVOTHYROXINE SODIUM 88 MCG PO TABS: 88.0000 ug | ORAL_TABLET | Freq: Every day | ORAL | Status: DC

## 2011-04-08 NOTE — Telephone Encounter (Signed)
Refilled med

## 2011-04-12 ENCOUNTER — Encounter: Payer: Self-pay | Admitting: Medical

## 2011-04-12 ENCOUNTER — Ambulatory Visit (INDEPENDENT_AMBULATORY_CARE_PROVIDER_SITE_OTHER): Payer: MEDICARE | Admitting: Medical

## 2011-04-12 VITALS — BP 138/62 | HR 66 | Temp 97.9°F | Resp 16 | Wt 129.0 lb

## 2011-04-12 DIAGNOSIS — R109 Unspecified abdominal pain: Secondary | ICD-10-CM

## 2011-04-12 DIAGNOSIS — M549 Dorsalgia, unspecified: Secondary | ICD-10-CM | POA: Insufficient documentation

## 2011-04-12 LAB — POCT URINALYSIS DIPSTICK
Bilirubin, UA: NEGATIVE
Glucose, UA: NEGATIVE
Ketones, UA: NEGATIVE

## 2011-04-12 MED ORDER — CIPROFLOXACIN HCL 500 MG PO TABS: 500.0000 mg | ORAL_TABLET | Freq: Two times a day (BID) | ORAL | Status: DC

## 2011-04-12 MED ORDER — TRAMADOL HCL 50 MG PO TABS: 50.0000 mg | ORAL_TABLET | Freq: Four times a day (QID) | ORAL | Status: DC | PRN

## 2011-04-12 MED ORDER — NITROFURANTOIN MONOHYD MACRO 100 MG PO CAPS: 100.0000 mg | ORAL_CAPSULE | Freq: Two times a day (BID) | ORAL | Status: DC

## 2011-04-12 NOTE — Patient Instructions (Signed)
Begin Cipro twice daily for antibiotic for possible urinary tract infection.  You can use Ultram 50mg , 1 tablet every 4-6 hours as needed for pain.  Rest, hydrate well with water.  We will call you with urine culture results.  Call or return if worse in the meantime.

## 2011-04-12 NOTE — Progress Notes (Signed)
Subjective:   HPI  [name] is a 77 y.o. female who presents with back pain and lower stomach pain.  Back pain for weeks, but stomach pain of recent.  Wants urine specimen checked in case she has a kidney infection.  Last UTI was months ago.   Denies fever, chills, aches.  Denies burning, but is having some urinary frequency, particularly at night.  Denies blood in urine.  Denies urgency.  Back pain worse with sitting, but not particularly worse with ROM.  Denies recent fall or injury. Occasionally uses Advil.  No other aggravating or relieving factors.     No other c/o.  The following portions of the patient's history were reviewed and updated as appropriate: allergies, current medications, past family history, past medical history, past social history, past surgical history and problem list.  Past Medical History  Diagnosis Date  . Hypertension   . Arthritis     RT HIP  . Osteoporosis   . Renal insufficiency   . Atrial fib/flutter, transient    Review of Systems Constitutional: -fever, -chills, -sweats, -unexpected -weight change,-fatigue ENT: -runny nose, -ear pain, -sore throat Cardiology:  -chest pain, -palpitations, -edema Respiratory: -cough, -shortness of breath, -wheezing Gastroenterology: +abdominal pain, -nausea, -vomiting, -diarrhea, -constipation Hematology: -bleeding or bruising problems Musculoskeletal: -arthralgias, -myalgias, -joint swelling, -back pain Ophthalmology: -vision changes Urology: -dysuria, -difficulty urinating, -hematuria, +urinary frequency, -urgency Neurology: -headache, -weakness, -tingling, -numbness     Objective:   Physical Exam  Filed Vitals:   04/12/11 1028  BP: 138/62  Pulse: 66  Temp: 97.9 F (36.6 C)  Resp: 16    General appearance: alert, no distress, WD/WN, elderly white female Oral cavity: MMM, no lesions Neck: supple, no lymphadenopathy, no thyromegaly, no masses Heart: RRR, normal S1, S2, no murmurs Lungs: CTA  bilaterally, no wheezes, rhonchi, or rales Abdomen: +bs, soft, mild generalized tenderness, non distended, no masses, no hepatomegaly, no splenomegaly Back: mild lumbar tenderness generalized, no CVA tenderness, ROM without pain Pulses: 2+ symmetric, upper and lower extremities, normal cap refill   Assessment and Plan :    Encounter Diagnoses  Name Primary?  . Back pain Yes  . Abdominal pain    Will treat empirically for UTI with course of Cipro.  Urine culture sent but there was a very small amount of urine, so can't guarantee culture will be successful.   Advised good hydration, Ultram for pain.  Call/return if worse, fever, worsening pain, etc.  In general she likely has some lumbar arthritis causing some low back and SI pain.  Ultram prn, recheck in 1-2 wk if not improving.

## 2011-04-13 ENCOUNTER — Encounter: Payer: Self-pay | Admitting: Medical

## 2011-04-13 ENCOUNTER — Ambulatory Visit (INDEPENDENT_AMBULATORY_CARE_PROVIDER_SITE_OTHER): Payer: MEDICARE | Admitting: Medical

## 2011-04-13 VITALS — BP 150/80 | HR 88 | Temp 98.0°F | Resp 16

## 2011-04-13 DIAGNOSIS — T7840XA Allergy, unspecified, initial encounter: Secondary | ICD-10-CM

## 2011-04-13 DIAGNOSIS — R21 Rash and other nonspecific skin eruption: Secondary | ICD-10-CM

## 2011-04-13 DIAGNOSIS — R109 Unspecified abdominal pain: Secondary | ICD-10-CM

## 2011-04-13 DIAGNOSIS — M549 Dorsalgia, unspecified: Secondary | ICD-10-CM

## 2011-04-13 LAB — POCT URINALYSIS DIPSTICK
Bilirubin, UA: NEGATIVE
Blood, UA: NEGATIVE
Nitrite, UA: NEGATIVE
pH, UA: 7

## 2011-04-13 NOTE — Patient Instructions (Signed)
Stop both the Cipro and the Tramadol/Ultram for now.  Drink lots of water.  You can use topical benadryl cream and ice packs for the rash or for any itching.    Take Benadryl 25 mg OTC 1/2-1 tablet two to three times daily for the next few days for allergic reaction.  We will call with urine culture and lab results.    If the rash is worsening, then call back.

## 2011-04-13 NOTE — Progress Notes (Signed)
Subjective:   HPI  [name] is a 77 y.o. female who presents for c/o allergic reaction.  I saw her yesterday for possible UTI and back pain.  She has used both Cipro and Ultram in the past, just this year without problem.  Last evening she began Cipro for lower abdominal pain, urinalysis with bacteria, and back pain suspicious for UTI.  She also used some Ultram last night . However, this morning she had rash on both thighs and chest, and now its spreading to arms.  She notes less belly pain today, back pain about the same, but no new symptoms.  No fever, SOB, wheezing, swelling.  No other aggravating or relieving factors.    No other c/o.  The following portions of the patient's history were reviewed and updated as appropriate: allergies, current medications, past family history, past medical history, past social history, past surgical history and problem list.  Past Medical History  Diagnosis Date  . Hypertension   . Arthritis     RT HIP  . Osteoporosis   . Renal insufficiency   . Atrial fib/flutter, transient    Review of Systems Constitutional: -fever, -chills, -sweats, -unexpected -weight change,-fatigue ENT: -runny nose, -ear pain, -sore throat Cardiology:  -chest pain, -palpitations, -edema Respiratory: -cough, -shortness of breath, -wheezing Gastroenterology: -abdominal pain, -nausea, -vomiting, -diarrhea, -constipation Hematology: -bleeding or bruising problems Musculoskeletal: -arthralgias, -myalgias, -joint swelling, -back pain Ophthalmology: -vision changes Urology: -dysuria, -difficulty urinating, -hematuria, -urinary frequency, -urgency Neurology: -headache, -weakness, -tingling, -numbness,+rash    Objective:   Physical Exam  Filed Vitals:   04/13/11 1554  BP: 150/80  Pulse: 88  Temp: 98 F (36.7 C)  Resp: 16    General appearance: alert, no distress, WD/WN Skin: diffuse erythema of bilat upper thighs, middle of chest, right arm and some on the left  arm and whealed rash on right neck on upper thighs Oral cavity: MMM, no lesions Neck: supple, no lymphadenopathy, no thyromegaly, no masses Heart: RRR, normal S1, S2, no murmurs Lungs: CTA bilaterally, no wheezes, rhonchi, or rales Abdomen: +bs, soft, non tender, non distended Pulses: 2+ symmetric Ext: no edema  Assessment and Plan :    Encounter Diagnoses  Name Primary?  . Allergic reaction Yes  . Rash   . Abdominal pain   . Back pain    Likely allergic reaction to Cipro.  Advised she stop both Cipro and Ultram for now.  Hydrate well. Begin Benadryl OTC.  We will await urine culture results.  Labs today.  She will call/return if rash worsens or if any new symptoms such as swelling, skin peeling, SOB, etc.   Follow-up pending labs.

## 2011-04-14 ENCOUNTER — Other Ambulatory Visit: Payer: Self-pay | Admitting: Medical

## 2011-04-14 LAB — COMPREHENSIVE METABOLIC PANEL
AST: 28 U/L (ref 0–37)
BUN: 29 mg/dL — ABNORMAL HIGH (ref 6–23)
Calcium: 9.7 mg/dL (ref 8.4–10.5)
Chloride: 101 mEq/L (ref 96–112)
Creat: 2.09 mg/dL — ABNORMAL HIGH (ref 0.50–1.10)

## 2011-04-14 LAB — CBC WITH DIFFERENTIAL/PLATELET
HCT: 44 % (ref 36.0–46.0)
Hemoglobin: 14.5 g/dL (ref 12.0–15.0)
Lymphocytes Relative: 11 % — ABNORMAL LOW (ref 12–46)
MCHC: 33 g/dL (ref 30.0–36.0)
Monocytes Absolute: 0.9 10*3/uL (ref 0.1–1.0)
Monocytes Relative: 6 % (ref 3–12)
Neutro Abs: 12.8 10*3/uL — ABNORMAL HIGH (ref 1.7–7.7)
WBC: 15.5 10*3/uL — ABNORMAL HIGH (ref 4.0–10.5)

## 2011-04-14 LAB — URINE CULTURE: Colony Count: >=100000

## 2011-04-14 MED ORDER — LEVOTHYROXINE SODIUM 88 MCG PO TABS: 88.0000 ug | ORAL_TABLET | Freq: Every day | ORAL | Status: DC

## 2011-04-15 ENCOUNTER — Other Ambulatory Visit: Payer: Self-pay | Admitting: Medical

## 2011-04-15 MED ORDER — NITROFURANTOIN MONOHYD MACRO 100 MG PO CAPS: 100.0000 mg | ORAL_CAPSULE | Freq: Two times a day (BID) | ORAL | Status: AC

## 2011-04-22 ENCOUNTER — Telehealth: Payer: Self-pay | Admitting: Family Medicine

## 2011-04-22 NOTE — Telephone Encounter (Signed)
PATIENT WAS NOTIFIED AND SHE WILL TRY TO MAKE HERE ON Monday. CLS

## 2011-04-22 NOTE — Telephone Encounter (Signed)
No i don't think her medication is causing her to feel bad.  We were treating for urinary tract infection, but if she feels worse or not a lot better, then I would recheck.  Her recent white count was elevated, her kidney function is abnormal in general, and with her med list and history, I recommend recheck if no better or worse.    She has a hx/o atrial fibrillation.  Does she feel her heart skipping beats or acting up compared to prior episodes of afib?  I never heard back about the rash from last msg.  Is the rash better?  Regarding recheck - we could either try and squeeze her in today or Monday, or if feeling real bad, go to Urgent Care or ED if we can't get her in today.

## 2011-04-26 ENCOUNTER — Ambulatory Visit (INDEPENDENT_AMBULATORY_CARE_PROVIDER_SITE_OTHER): Payer: MEDICARE | Admitting: Family Medicine

## 2011-04-26 ENCOUNTER — Encounter: Payer: Self-pay | Admitting: Family Medicine

## 2011-04-26 VITALS — BP 126/70 | HR 75

## 2011-04-26 DIAGNOSIS — M549 Dorsalgia, unspecified: Secondary | ICD-10-CM

## 2011-04-26 DIAGNOSIS — N390 Urinary tract infection, site not specified: Secondary | ICD-10-CM

## 2011-04-26 LAB — POCT URINALYSIS DIPSTICK
Bilirubin, UA: NEGATIVE
Glucose, UA: NEGATIVE
Ketones, UA: NEGATIVE
Spec Grav, UA: 1.01

## 2011-04-26 NOTE — Progress Notes (Signed)
  Subjective:    Patient ID: [name], female    DOB: Mar 26, 1920, 77 y.o.   MRN: [id number]  HPI She is here for recheck. She is still having some back pain however she describes the pain is made worse with motion. She also is having some lower abdominal discomfort but says she's not having much in the way of dysuria or frequency. She recently finished the Macrobid.   Review of Systems     Objective:   Physical Exam Alert and in no distress. Slight lower abdominal tenderness to palpation is noted. No masses. Urinalysis was negative. Exam does show some slight sacral tenderness.       Assessment & Plan:   1. UTI (urinary tract infection)  POCT Urinalysis Dipstick  2. Back pain     recommend she try Azo-Standard to see if this will help with her possible urinary tract symptoms. Also recommend Tylenol or Advil for her back pain.

## 2011-04-26 NOTE — Patient Instructions (Signed)
You can take Tylenol or Advil for the back pain. Try some Azo-Standard see this will help the lower abdominal pain. Let me know

## 2011-06-08 ENCOUNTER — Telehealth: Payer: Self-pay | Admitting: Family Medicine

## 2011-06-08 NOTE — Telephone Encounter (Signed)
PT CALLED AND WILL COME IN 06/09/11

## 2011-06-08 NOTE — Telephone Encounter (Signed)
Go ahead and have her drop off a urine specimen 

## 2011-06-09 ENCOUNTER — Other Ambulatory Visit (INDEPENDENT_AMBULATORY_CARE_PROVIDER_SITE_OTHER): Payer: MEDICARE

## 2011-06-09 DIAGNOSIS — R52 Pain, unspecified: Secondary | ICD-10-CM

## 2011-06-09 LAB — POCT URINALYSIS DIPSTICK
Bilirubin, UA: NEGATIVE
Ketones, UA: NEGATIVE

## 2011-06-10 ENCOUNTER — Other Ambulatory Visit: Payer: Self-pay | Admitting: Family Medicine

## 2011-06-10 ENCOUNTER — Telehealth: Payer: Self-pay | Admitting: Family Medicine

## 2011-06-10 MED ORDER — SULFAMETHOXAZOLE-TRIMETHOPRIM 800-160 MG PO TABS: 1.0000 | ORAL_TABLET | Freq: Two times a day (BID) | ORAL | Status: DC

## 2011-06-10 NOTE — Telephone Encounter (Signed)
Cipro 500 mg 1 bid x 10 days recheck here in 10 days.  Called into [facility] [address]

## 2011-06-10 NOTE — Telephone Encounter (Signed)
Still having lower stomach dull ache and frequency at night.  Wants results today if possible, advised Dr. Susann Givens out of town.

## 2011-06-10 NOTE — Telephone Encounter (Signed)
Correction.  Septra DS #20 1 bid, [facility] and pt aware of correction.  Pt will follow up in 10 days.

## 2011-06-14 ENCOUNTER — Encounter: Payer: Self-pay | Admitting: Internal Medicine

## 2011-06-15 ENCOUNTER — Ambulatory Visit (INDEPENDENT_AMBULATORY_CARE_PROVIDER_SITE_OTHER): Payer: MEDICARE | Admitting: Family Medicine

## 2011-06-15 ENCOUNTER — Encounter: Payer: Self-pay | Admitting: Family Medicine

## 2011-06-15 VITALS — BP 120/60 | HR 80 | Temp 98.0°F | Ht 62.0 in | Wt 132.0 lb

## 2011-06-15 DIAGNOSIS — R109 Unspecified abdominal pain: Secondary | ICD-10-CM

## 2011-06-15 NOTE — Patient Instructions (Signed)
Drink plenty of fluids.  High fiber diet is recommended to help with constipation.   STOP the antibiotics you are taking now--I'm not convinced that you need them, and they are causing you side effects.  Make sure you are able to give a urine specimen for Dr. Susann Givens when you see him next week.

## 2011-06-15 NOTE — Progress Notes (Signed)
Chief complaint:  Possible allergic reaction to medication. She was prescribed sulfamethoxazole/trimeth on 2/1 by Dr.Lalonde for UTI. Symptoms include: shakiness, dizziness, chest tightness and blurred vision since yesterday  HPI:  Patient had called with lower abdominal discomfort on 1/30.  She came and gave a urine specimen, and was prescribed Septra DS.  Her lower abdominal pain is improving only slightly.  Noticed her hands shaking a lot over the weekend (tremor).  Doesn't feel nervous, just that her hands are shaky.  Also feeling lightheaded and dizzy.  Hasn't had any falls.  Intermittently gets some blurred vision while watching TV or reading, noticing this a little more recently, but problem noticed prior to being on meds.  Complaining of a tightness in her chest since last night, after she had gone to bed.  Belched some last night, and felt better after belching. Slightly sore to touch on the right side of her chest.  Denies any exertional chest pain.  Pressure isn't as bad as it was last night.  Denies skin rash.  Denies increased frequency, urgency, dysuria, hematuria or incontinence.  Just some leakage when she gets up in the night on the way to the bathroom, occasionally leaks some.  Urine culture not sent.  Had trace leuks, protein and blood in her urine on urine dip.  Past Medical History  Diagnosis Date  . Hypertension   . Arthritis     RT HIP  . Osteoporosis   . Renal insufficiency   . Atrial fib/flutter, transient    Current Outpatient Prescriptions on File Prior to Visit  Medication Sig Dispense Refill  . amiodarone (PACERONE) 100 MG tablet Take 100 mg by mouth daily.        Marland Kitchen amLODipine (NORVASC) 5 MG tablet Take 5 mg by mouth daily.        Marland Kitchen aspirin 81 MG tablet Take 81 mg by mouth daily.        Marland Kitchen atenolol (TENORMIN) 50 MG tablet Take 50 mg by mouth daily.        . Ginger, Zingiber officinalis, 250 MG CAPS Take by mouth.        . levothyroxine (SYNTHROID) 88 MCG tablet Take  1 tablet (88 mcg total) by mouth daily.  90 tablet  0  . Multiple Vitamins-Minerals (ICAPS MV PO) Take by mouth daily.        Marland Kitchen pyridOXINE (BL VITAMIN B-6) 50 MG tablet Take 50 mg by mouth daily.        Marland Kitchen sulfamethoxazole-trimethoprim (BACTRIM DS,SEPTRA DS) 800-160 MG per tablet Take 1 tablet by mouth 2 (two) times daily.  20 tablet  0  . Vitamin D, Ergocalciferol, (DRISDOL) 50000 UNITS CAPS Take 50,000 Units by mouth.         Allergies  Allergen Reactions  . Amoxicillin Hives  . Ciprofloxacin Hives  . Coumadin Hives  . Phenergan Hives  . Polocaine (Carbocaine) Hives  . Procaine Hives  . Tiazac (Diltiazem Hcl) Hives  . Vicodin (Hydrocodone-Acetaminophen) Hives   ROS: Denies fevers, URI symptoms, sore throat, cough.  Denies nausea, vomiting, diarrhea. Some low back pain just when she wakes up in the mornings, better after being up and about for a while. No urinary complaints.  Some constipation  PHYSICAL EXAM BP 120/60  Pulse 80  Temp(Src) 98 F (36.7 C) (Oral)  Ht 5\' 2"  (1.575 m)  Wt 132 lb (59.875 kg)  BMI 24.14 kg/m2 Well developed, elderly female in no distress Neck: no lymphadenopathy or mass Mild tenderness  across chest  Mild tenderness bilateral lower abdomen and just below umbilicus. Nondistended.  NABS, no organomegaly or mass Back: no CVA tenderness Heart: regular rate and rhythm Lungs: clear bilaterally Neuro: alert and oriented.  Mild resting handing tremor bilaterally  ASSESSMENT/PLAN:  1. Abdominal  pain, other specified site    Check urine dip.  If no significant evidence of infection, then will have her stop the ABX.  I think some of her discomfort in abdomen is related to gas and constipation, as she isn't having any urinary complaints.  Patient was unable to give a urine specimen.  Given lack of urinary complaints, and minimal response to 5 days of antibiotics (re: lower abdominal pain), and side effects from medications, she was asked to stop her  antibiotics.  Urine dip from last week only showed trace LE, blood and protein (and may not have been a good clean catch specimen).  She is scheduled for appt next week with Dr Susann Givens. She was asked to hydrate well, and be expecting to give a urine sample next week.  If abnormal dip, then send for culture.  Lower abdominal pain my be contributed some by gas and constipation.  Discussed adequate hydration and high fiber diet.

## 2011-06-20 ENCOUNTER — Encounter: Payer: Self-pay | Admitting: Family Medicine

## 2011-06-20 ENCOUNTER — Ambulatory Visit (INDEPENDENT_AMBULATORY_CARE_PROVIDER_SITE_OTHER): Payer: MEDICARE | Admitting: Family Medicine

## 2011-06-20 VITALS — BP 118/70 | HR 80 | Wt 129.0 lb

## 2011-06-20 DIAGNOSIS — R5383 Other fatigue: Secondary | ICD-10-CM

## 2011-06-20 DIAGNOSIS — R5381 Other malaise: Secondary | ICD-10-CM

## 2011-06-20 DIAGNOSIS — N3090 Cystitis, unspecified without hematuria: Secondary | ICD-10-CM

## 2011-06-20 LAB — POCT URINALYSIS DIPSTICK
Bilirubin, UA: NEGATIVE
Glucose, UA: NEGATIVE
Nitrite, UA: NEGATIVE
Urobilinogen, UA: NEGATIVE
pH, UA: 7

## 2011-06-20 NOTE — Progress Notes (Signed)
  Subjective:    Patient ID: [name], female    DOB: 06/09/1919, 78 y.o.   MRN: [id number]  HPI She is here for a recheck. She continues to have difficulty with fatigue, decreased concentration, occasional difficulty with back pain and occasional trouble with blurred vision. She also is having some lower abdominal discomfort. In the past her urine have been questionably positive for infection. Recently she was treated with Septra.   Review of Systems     Objective:   Physical Exam alert and in no distress. Tympanic membranes and canals are normal. Throat is clear. Tonsils are normal. Neck is supple without adenopathy or thyromegaly. Cardiac exam shows a regular sinus rhythm without murmurs or gallops. Lungs are clear to auscultation. Abdominal exam shows no masses or tenderness. Urine microscopic showed contamination with epithelial cells.       Assessment & Plan:   1. Bladder infection  POCT Urinalysis Dipstick  2. Fatigue     I will order a urine culture and not place her on an antibiotic right now. Refer to urology.

## 2011-06-22 LAB — URINE CULTURE

## 2011-07-08 DIAGNOSIS — J9690 Respiratory failure, unspecified, unspecified whether with hypoxia or hypercapnia: Secondary | ICD-10-CM

## 2011-07-08 HISTORY — DX: Respiratory failure, unspecified, unspecified whether with hypoxia or hypercapnia: J96.90

## 2011-07-15 ENCOUNTER — Ambulatory Visit (INDEPENDENT_AMBULATORY_CARE_PROVIDER_SITE_OTHER): Payer: MEDICARE | Admitting: Family Medicine

## 2011-07-15 VITALS — BP 120/70 | HR 76 | Wt 129.0 lb

## 2011-07-15 DIAGNOSIS — F341 Dysthymic disorder: Secondary | ICD-10-CM

## 2011-07-15 MED ORDER — CITALOPRAM HYDROBROMIDE 10 MG PO TABS: 10.0000 mg | ORAL_TABLET | Freq: Every day | ORAL | Status: DC

## 2011-07-15 NOTE — Progress Notes (Signed)
  Subjective:    Patient ID: [name], female    DOB: 29-Apr-1920, 78 y.o.   MRN: [id number]  HPI She is brought in by her daughter for consult. She was recently evaluated by urology. Their evaluation showed no urologic issue. They're concerned about diverticulosis causing some of the discomfort. Her daughter is also concerned that this could easily be psychological overlay. She thinks that an SSRI might possibly be helpful.   Review of Systems     Objective:   Physical Exam Alert and in no distress with slightly flat affect       Assessment & Plan:  I agree with her daughter that an SSRI might possibly be beneficial. Discussed starting up very slowly with this. She will call me in 2 or 3 weeks to let me know how her mother is doing. 1. Dysthymia  citalopram (CELEXA) 10 MG tablet

## 2011-07-15 NOTE — Patient Instructions (Signed)
Call me in several weeks and let me know how she's doing

## 2011-08-22 ENCOUNTER — Telehealth: Payer: Self-pay | Admitting: Internal Medicine

## 2011-08-22 MED ORDER — LEVOTHYROXINE SODIUM 88 MCG PO TABS: 88.0000 ug | ORAL_TABLET | Freq: Every day | ORAL | Status: DC

## 2011-08-22 NOTE — Telephone Encounter (Signed)
Refilled levothyroxine with 90 day to [facility]

## 2011-09-07 ENCOUNTER — Encounter: Payer: Self-pay | Admitting: Urology

## 2011-09-17 ENCOUNTER — Emergency Department (HOSPITAL_COMMUNITY): Payer: MEDICARE

## 2011-09-17 ENCOUNTER — Encounter (HOSPITAL_COMMUNITY): Payer: Self-pay

## 2011-09-17 ENCOUNTER — Inpatient Hospital Stay (HOSPITAL_COMMUNITY)
Admission: EM | Admit: 2011-09-17 | Discharge: 2011-09-30 | DRG: 291 | Disposition: A | Discharge status: Home / Self Care | Payer: MEDICARE | Attending: Internal Medicine | Admitting: Internal Medicine

## 2011-09-17 DIAGNOSIS — R918 Other nonspecific abnormal finding of lung field: Secondary | ICD-10-CM | POA: Diagnosis present

## 2011-09-17 DIAGNOSIS — D72829 Elevated white blood cell count, unspecified: Secondary | ICD-10-CM | POA: Diagnosis present

## 2011-09-17 DIAGNOSIS — M810 Age-related osteoporosis without current pathological fracture: Secondary | ICD-10-CM | POA: Diagnosis present

## 2011-09-17 DIAGNOSIS — T380X5A Adverse effect of glucocorticoids and synthetic analogues, initial encounter: Secondary | ICD-10-CM | POA: Diagnosis present

## 2011-09-17 DIAGNOSIS — R740 Nonspecific elevation of levels of transaminase and lactic acid dehydrogenase [LDH]: Secondary | ICD-10-CM

## 2011-09-17 DIAGNOSIS — Z79899 Other long term (current) drug therapy: Secondary | ICD-10-CM

## 2011-09-17 DIAGNOSIS — I4891 Unspecified atrial fibrillation: Secondary | ICD-10-CM | POA: Diagnosis present

## 2011-09-17 DIAGNOSIS — J9600 Acute respiratory failure, unspecified whether with hypoxia or hypercapnia: Secondary | ICD-10-CM | POA: Diagnosis present

## 2011-09-17 DIAGNOSIS — R945 Abnormal results of liver function studies: Secondary | ICD-10-CM | POA: Diagnosis present

## 2011-09-17 DIAGNOSIS — Z96649 Presence of unspecified artificial hip joint: Secondary | ICD-10-CM

## 2011-09-17 DIAGNOSIS — I509 Heart failure, unspecified: Secondary | ICD-10-CM | POA: Diagnosis present

## 2011-09-17 DIAGNOSIS — R7989 Other specified abnormal findings of blood chemistry: Secondary | ICD-10-CM | POA: Diagnosis present

## 2011-09-17 DIAGNOSIS — J9601 Acute respiratory failure with hypoxia: Secondary | ICD-10-CM

## 2011-09-17 DIAGNOSIS — J189 Pneumonia, unspecified organism: Secondary | ICD-10-CM | POA: Diagnosis present

## 2011-09-17 DIAGNOSIS — I5030 Unspecified diastolic (congestive) heart failure: Secondary | ICD-10-CM | POA: Diagnosis present

## 2011-09-17 DIAGNOSIS — N189 Chronic kidney disease, unspecified: Secondary | ICD-10-CM | POA: Diagnosis present

## 2011-09-17 DIAGNOSIS — D649 Anemia, unspecified: Secondary | ICD-10-CM | POA: Diagnosis not present

## 2011-09-17 DIAGNOSIS — M129 Arthropathy, unspecified: Secondary | ICD-10-CM | POA: Diagnosis present

## 2011-09-17 DIAGNOSIS — I129 Hypertensive chronic kidney disease with stage 1 through stage 4 chronic kidney disease, or unspecified chronic kidney disease: Secondary | ICD-10-CM | POA: Diagnosis present

## 2011-09-17 DIAGNOSIS — I5033 Acute on chronic diastolic (congestive) heart failure: Principal | ICD-10-CM | POA: Diagnosis present

## 2011-09-17 DIAGNOSIS — R531 Weakness: Secondary | ICD-10-CM | POA: Diagnosis present

## 2011-09-17 DIAGNOSIS — E039 Hypothyroidism, unspecified: Secondary | ICD-10-CM | POA: Diagnosis present

## 2011-09-17 DIAGNOSIS — F341 Dysthymic disorder: Secondary | ICD-10-CM

## 2011-09-17 DIAGNOSIS — Z950 Presence of cardiac pacemaker: Secondary | ICD-10-CM

## 2011-09-17 DIAGNOSIS — R0902 Hypoxemia: Secondary | ICD-10-CM

## 2011-09-17 DIAGNOSIS — Z7982 Long term (current) use of aspirin: Secondary | ICD-10-CM

## 2011-09-17 DIAGNOSIS — Z888 Allergy status to other drugs, medicaments and biological substances status: Secondary | ICD-10-CM

## 2011-09-17 DIAGNOSIS — N179 Acute kidney failure, unspecified: Secondary | ICD-10-CM | POA: Diagnosis present

## 2011-09-17 DIAGNOSIS — Z66 Do not resuscitate: Secondary | ICD-10-CM | POA: Diagnosis present

## 2011-09-17 DIAGNOSIS — D696 Thrombocytopenia, unspecified: Secondary | ICD-10-CM

## 2011-09-17 DIAGNOSIS — R197 Diarrhea, unspecified: Secondary | ICD-10-CM | POA: Diagnosis not present

## 2011-09-17 DIAGNOSIS — E876 Hypokalemia: Secondary | ICD-10-CM | POA: Diagnosis present

## 2011-09-17 LAB — DIFFERENTIAL
Basophils Relative: 0 % (ref 0–1)
Eosinophils Absolute: 0 10*3/uL (ref 0.0–0.7)
Eosinophils Relative: 0 % (ref 0–5)
Lymphs Abs: 0.7 10*3/uL (ref 0.7–4.0)
Monocytes Absolute: 1.5 10*3/uL — ABNORMAL HIGH (ref 0.1–1.0)
Monocytes Relative: 11 % (ref 3–12)
Neutrophils Relative %: 84 % — ABNORMAL HIGH (ref 43–77)

## 2011-09-17 LAB — COMPREHENSIVE METABOLIC PANEL
Albumin: 3 g/dL — ABNORMAL LOW (ref 3.5–5.2)
BUN: 37 mg/dL — ABNORMAL HIGH (ref 6–23)
Chloride: 99 mEq/L (ref 96–112)
Creatinine, Ser: 1.53 mg/dL — ABNORMAL HIGH (ref 0.50–1.10)
GFR calc Af Amer: 34 mL/min — ABNORMAL LOW (ref >90)
GFR calc non Af Amer: 29 mL/min — ABNORMAL LOW (ref >90)
Glucose, Bld: 124 mg/dL — ABNORMAL HIGH (ref 70–99)
Total Bilirubin: 0.4 mg/dL (ref 0.3–1.2)

## 2011-09-17 LAB — POCT I-STAT TROPONIN I: Troponin i, poc: 0.03 ng/mL (ref 0.00–0.08)

## 2011-09-17 LAB — CBC
HCT: 37.3 % (ref 36.0–46.0)
Hemoglobin: 12.1 g/dL (ref 12.0–15.0)
Hemoglobin: 11.6 g/dL — ABNORMAL LOW (ref 12.0–15.0)
MCH: 30.1 pg (ref 26.0–34.0)
MCH: 30.4 pg (ref 26.0–34.0)
MCHC: 32.4 g/dL (ref 30.0–36.0)
MCV: 92.8 fL (ref 78.0–100.0)
RBC: 3.82 MIL/uL — ABNORMAL LOW (ref 3.87–5.11)

## 2011-09-17 LAB — CREATININE, SERUM
Creatinine, Ser: 1.5 mg/dL — ABNORMAL HIGH (ref 0.50–1.10)
GFR calc non Af Amer: 30 mL/min — ABNORMAL LOW (ref >90)

## 2011-09-17 LAB — D-DIMER, QUANTITATIVE: D-Dimer, Quant: 1.25 ug/mL-FEU — ABNORMAL HIGH (ref 0.00–0.48)

## 2011-09-17 LAB — CARDIAC PANEL(CRET KIN+CKTOT+MB+TROPI)
CK, MB: 3.4 ng/mL (ref 0.3–4.0)
Total CK: 88 U/L (ref 7–177)
Troponin I: <0.3 ng/mL (ref <0.30)

## 2011-09-17 MED ORDER — SODIUM CHLORIDE 0.9 % IJ SOLN
3.0000 mL | INTRAMUSCULAR | Status: DC | PRN
  Administered 2011-09-18: 3 mL via INTRAVENOUS

## 2011-09-17 MED ORDER — ENOXAPARIN SODIUM 30 MG/0.3ML ~~LOC~~ SOLN
30.0000 mg | SUBCUTANEOUS | Status: DC
  Administered 2011-09-17 – 2011-09-22 (×6): 30 mg via SUBCUTANEOUS
  Filled 2011-09-17 (×8): qty 0.3

## 2011-09-17 MED ORDER — FUROSEMIDE 10 MG/ML IJ SOLN
40.0000 mg | Freq: Two times a day (BID) | INTRAMUSCULAR | Status: DC
  Administered 2011-09-18: 40 mg via INTRAVENOUS
  Filled 2011-09-17 (×3): qty 4

## 2011-09-17 MED ORDER — ACETAMINOPHEN 325 MG PO TABS
650.0000 mg | ORAL_TABLET | ORAL | Status: DC | PRN
  Administered 2011-09-25: 650 mg via ORAL
  Filled 2011-09-17: qty 2

## 2011-09-17 MED ORDER — DEXTROSE 5 % IV SOLN
1.0000 g | Freq: Once | INTRAVENOUS | Status: AC
  Administered 2011-09-17: 1 g via INTRAVENOUS
  Filled 2011-09-17: qty 10

## 2011-09-17 MED ORDER — SODIUM CHLORIDE 0.9 % IV SOLN: 250.0000 mL | INTRAVENOUS | Status: DC | PRN

## 2011-09-17 MED ORDER — DEXTROSE 5 % IV SOLN
500.0000 mg | Freq: Once | INTRAVENOUS | Status: AC
  Administered 2011-09-17: 500 mg via INTRAVENOUS
  Filled 2011-09-17: qty 500

## 2011-09-17 MED ORDER — ONDANSETRON HCL 4 MG/2ML IJ SOLN: 4.0000 mg | Freq: Four times a day (QID) | INTRAMUSCULAR | Status: DC | PRN

## 2011-09-17 MED ORDER — SODIUM CHLORIDE 0.9 % IJ SOLN
3.0000 mL | Freq: Two times a day (BID) | INTRAMUSCULAR | Status: DC
  Administered 2011-09-18 – 2011-09-23 (×9): 3 mL via INTRAVENOUS

## 2011-09-17 MED ORDER — FUROSEMIDE 10 MG/ML IJ SOLN
40.0000 mg | Freq: Once | INTRAMUSCULAR | Status: AC
  Administered 2011-09-17: 40 mg via INTRAVENOUS
  Filled 2011-09-17: qty 4

## 2011-09-17 MED ORDER — SODIUM CHLORIDE 0.9 % IV SOLN: INTRAVENOUS | Status: DC

## 2011-09-17 NOTE — H&P (Addendum)
PCP:   Carollee Herter, MD, MD  Cardiology: Verdis Prime  Chief Complaint:   Leg swelling and SOB  HPI: [name] is a 78 y.o. female   has a past medical history of Hypertension; Arthritis; Osteoporosis; Renal insufficiency; Atrial fib/flutter, transient; and Thyroid disease.   Presented with  Dyspnea on exertion for 1 week and worsening peripheral edema but progressively getting worse was started by Dr. Katrinka Blazing on Lasix and was titrated up to 80 BID still no improvement in edema. No fever or chills. No chest pain. But yesterday had right shoulder pain. CXR as an outpatient   showed infiltrate this was repeated today and was somewhat worse and she was noted to have leukocytosis.   Review of Systems:    Pertinent positives include: leg edema, shortness of breath at rest., dyspnea on exertion,  non-productive cough,  Constitutional:  No weight loss, night sweats, Fevers, chills, fatigue, weight loss  HEENT:  No headaches, Difficulty swallowing,Tooth/dental problems,Sore throat,  No sneezing, itching, ear ache, nasal congestion, post nasal drip,  Cardio-vascular:  No chest pain, Orthopnea, PND, anasarca, dizziness, palpitations.no Bilateral lower extremity swelling  GI:  No heartburn, indigestion, abdominal pain, nausea, vomiting, diarrhea, change in bowel habits, loss of appetite, melena, blood in stool, hematemesis Resp:    No excess mucus, no productive cough, No No coughing up of blood.No change in color of mucus.No wheezing. Skin:  no rash or lesions. No jaundice GU:  no dysuria, change in color of urine, no urgency or frequency. No straining to urinate.  No flank pain.  Musculoskeletal:  No joint pain or no joint swelling. No decreased range of motion. No back pain.  Psych:  No change in mood or affect. No depression or anxiety. No memory loss.  Neuro: no localizing neurological complaints, no tingling, no weakness, no double vision, no gait abnormality, no slurred  speech, no confusion  Otherwise ROS are negative except for above, 10 systems were reviewed  Past Medical History: Past Medical History  Diagnosis Date  . Hypertension   . Arthritis     RT HIP  . Osteoporosis   . Renal insufficiency   . Atrial fib/flutter, transient   . Thyroid disease    Past Surgical History  Procedure Date  . Joint replacement      RT HIP REPLACEMENT  . Knee arthroscopy     right  . Pacemaker insertion      Medications: Prior to Admission medications   Medication Sig Start Date End Date Taking? Authorizing Provider  amiodarone (PACERONE) 200 MG tablet Take 200 mg by mouth daily.   Yes Historical Provider, MD  amLODipine (NORVASC) 5 MG tablet Take 5 mg by mouth daily.     Yes Historical Provider, MD  aspirin 81 MG tablet Take 81 mg by mouth daily.     Yes Historical Provider, MD  atenolol (TENORMIN) 25 MG tablet Take 25 mg by mouth daily.   Yes Historical Provider, MD  Cholecalciferol (VITAMIN D) 2000 UNITS CAPS Take 2,000 Units by mouth.    Yes Historical Provider, MD  citalopram (CELEXA) 10 MG tablet Take 1 tablet (10 mg total) by mouth daily. 07/15/11 07/14/12 Yes Ronnald Nian, MD  furosemide (LASIX) 80 MG tablet Take 80 mg by mouth 2 (two) times daily.   Yes Historical Provider, MD  levothyroxine (SYNTHROID) 88 MCG tablet Take 1 tablet (88 mcg total) by mouth daily. 08/22/11 08/21/12 Yes Ronnald Nian, MD  Multiple Vitamin (MULITIVITAMIN WITH MINERALS) TABS  Take 1 tablet by mouth daily.   Yes Historical Provider, MD    Allergies:   Allergies  Allergen Reactions  . Amoxicillin Hives  . Ciprofloxacin Hives  . Polocaine (Mepivacaine Hcl) Hives  . Procaine Hives  . Promethazine Hcl Hives  . Quinidine   . Septra (Sulfamethoxazole W/Trimethoprim (Co-Trimoxazole)) Other (See Comments)    Dizziness and tremor  . Tiazac (Diltiazem Hcl) Hives  . Vicodin (Hydrocodone-Acetaminophen) Hives  . Warfarin Sodium     GI bleed    Social History:  Ambulatory   cane Lives at  Home with Husband with 24h sitters   reports that she has never smoked. She has never used smokeless tobacco. She reports that she does not drink alcohol or use illicit drugs.   Family History: family history includes Heart disease in her mother and Hypertension in her mother.    Physical Exam: Patient Vitals for the past 24 hrs:  BP Temp Temp src Pulse Resp SpO2  09/17/11 1641 131/63 mmHg 98.9 F (37.2 C) Oral 90  20  90 %  09/17/11 1505 117/71 mmHg - - - - 90 %  09/17/11 1453 - - - - - 91 %  09/17/11 1431 - - - - - 90 %  09/17/11 1430 113/50 mmHg 98 F (36.7 C) Oral 90  26  80 %    1. General:  in No Acute distress 2. Psychological: Alert and  Oriented 3. Head/ENT:   Moist  Mucous Membranes                          Head Non traumatic, neck supple                          Normal  Dentition 4. SKIN: normalSkin turgor,  Skin clean Dry and intact no rash 5. Heart:Regular rate and rhythm no Murmur, Rub or gallop 6. Lungs: diffuse crackles, patient is somewhat hypoxic to 87% on 5L of O2 7. Abdomen: Soft, non-tender, slightly distended 8. Lower extremities: no clubbing, cyanosis, 2+edema bilateraly 9. Neurologically Grossly intact, moving all 4 extremities equally 10. MSK: Normal range of motion  body mass index is unknown because there is no height or weight on file.   Labs on Admission:   [facility] 09/17/11 1527  NA 137  K 4.2  CL 99  CO2 22  GLUCOSE 124*  BUN 37*  CREATININE 1.53*  CALCIUM 9.0  MG --  PHOS --    Basename 09/17/11 1527  AST 55*  ALT 50*  ALKPHOS 88  BILITOT 0.4  PROT 6.4  ALBUMIN 3.0*   No results found for this basename: LIPASE:2,AMYLASE:2 in the last 72 hours  Basename 09/17/11 1657  WBC 14.1*  NEUTROABS 11.8*  HGB 12.1  HCT 37.3  MCV 92.8  PLT 357   No results found for this basename: CKTOTAL:3,CKMB:3,CKMBINDEX:3,TROPONINI:3 in the last 72 hours No results found for this basename:  TSH,T4TOTAL,FREET3,T3FREE,THYROIDAB in the last 72 hours No results found for this basename: VITAMINB12:2,FOLATE:2,FERRITIN:2,TIBC:2,IRON:2,RETICCTPCT:2 in the last 72 hours No results found for this basename: HGBA1C    The CrCl is unknown because both a height and weight (above a minimum accepted value) are required for this calculation.  Lab Results  Component Value Date   DDIMER 1.25* 09/17/2011     Other results:  I have pearsonaly reviewed this: ECG REPORT  Rate: 82  Rhythm: paced ST&T Change: N/A   BNP 3331.0  pg/mL    Cultures:    Component Value Date/Time   SDES BLOOD CENTRAL LINE 12/03/2006 1950   SPECREQUEST BOTTLES DRAWN AEROBIC AND ANAEROBIC 5CC 12/03/2006 1950   CULT NO GROWTH 5 DAYS 12/03/2006 1950   REPTSTATUS 12/09/2006 FINAL 12/03/2006 1950       Radiological Exams on Admission: Dg Chest Port 1 View  09/17/2011  *RADIOLOGY REPORT*  Clinical Data: Severe short of breath  PORTABLE CHEST - 1 VIEW  Comparison: Chest radiograph 09/16/2011  Findings: Left-sided pacemaker overlies stable cardiac silhouette. There is bilateral upper lobe air space disease more dense on the left similar to prior.  There is bibasilar effusions and atelectasis.  IMPRESSION:  1.  No improvement in upper lobe air space disease worse on the left. 2.  Bilateral pleural effusions  Original Report Authenticated By: Genevive Bi, M.D.    Assessment/Plan  78 yo with Hx of diastolic heart failure here with hypoxia, CHF and likely PNA.   Present on Admission:  .CAP (community acquired pneumonia) -  - will admit for treatment of CAP will start on appropriate antibiotic coverage. Rocephin and azithromycin   Obtain sputum cultures, blood cultures if febrile or if decompensates.  Provide oxygen as needed.   .CHF (congestive heart failure) - will diurese with IV lasix, obtain Echo, cycle cardiac enzymes, not a good candidate for ACEi given elevated Cr.  .Hypoxia - PNA vs CHF, also elevated d.dimer  but PE seems less likely given history and other etiologies.  Will monitor in step down and hope she improves with diuresis and antibiotics Slightly elevated LFT's etiology unclear could be part of fluid overload picture would follow up. No RUQ pain, no fever to indicate cholecystitis.  Prophylaxis:  Lovenox  CODE STATUS: DNR/DNI as per patient's wishes  I have spent a total of  55 min on this admission  Carmela Piechowski 09/17/2011, 7:48 PM

## 2011-09-17 NOTE — ED Notes (Signed)
Pt placed on nonrebreather d/t de-sating to 80's while speaking (per Dr Darvin Neighbours).  The plan is to re-assess after lasix and see if saturations improve.

## 2011-09-17 NOTE — ED Notes (Signed)
IV team paged and returned call for IV start.

## 2011-09-17 NOTE — ED Notes (Signed)
Daughter stated, SHE started going into Atrial fib and they up her med. , and then she started having SOB and really got bad yesterday and she took some of my dad's oxygen, and it helped some.  On 4 L of oxygen her O2 sats only went to 90%

## 2011-09-17 NOTE — ED Provider Notes (Cosign Needed)
History     CSN: [id number]  Arrival date & time 09/17/11  1410   First MD Initiated Contact with Patient 09/17/11 1501      Chief Complaint  Patient presents with  . Shortness of Breath    (Consider location/radiation/quality/duration/timing/severity/associated sxs/prior treatment) HPI Comments: Patient is a 78 year old woman with shortness of breath and edema. She has had atrial fibrillation for 3 or 4 weeks. She was seen by her cardiologist, [name] M.D., and her amiodarone was increased to 200 mg per day. She's not on Coumadin because she said intraperitoneal bleeding while on Coumadin in the past. Her shortness of breath has gotten progressively worse. 6 days ago she was seen by Dr. Katrinka Blazing and was placed on Lasix 20 mg per day. 4 days ago it was increased to 40 mg a day. Yesterday she was placed on atenolol 20 mg per day and given 160 mg of Lasix, which she took yesterday and today without much results. She therefore was brought to [facility] for evaluation of her shortness of breath and persistent edema.  Patient is a 78 y.o. female presenting with shortness of breath. The history is provided by the patient, a relative and medical records. No language interpreter was used.  Shortness of Breath  The current episode started 5 to 7 days ago. The problem occurs continuously. The problem has been gradually worsening. The problem is severe. The symptoms are relieved by nothing. The symptoms are aggravated by nothing. Associated symptoms include orthopnea and shortness of breath. Pertinent negatives include no chest pain, no chest pressure and no fever. She was not exposed to toxic fumes. She has not inhaled smoke recently. She has had no prior steroid use. Recently, medical care has been given by a specialist. Services received include medications given.    Past Medical History  Diagnosis Date  . Hypertension   . Arthritis     RT HIP  . Osteoporosis   . Renal insufficiency   .  Atrial fib/flutter, transient   . Thyroid disease     Past Surgical History  Procedure Date  . Joint replacement      RT HIP REPLACEMENT  . Knee arthroscopy     right  . Pacemaker insertion     Family History  Problem Relation Age of Onset  . Hypertension Mother   . Heart disease Mother     History  Substance Use Topics  . Smoking status: Never Smoker   . Smokeless tobacco: Never Used  . Alcohol Use: No    OB History    Grav Para Term Preterm Abortions TAB SAB Ect Mult Living                  Review of Systems  Constitutional: Negative.  Negative for fever and chills.  HENT: Negative.   Eyes: Negative.   Respiratory: Positive for shortness of breath.   Cardiovascular: Positive for palpitations, orthopnea and leg swelling. Negative for chest pain.  Gastrointestinal: Negative.   Genitourinary: Negative.   Musculoskeletal: Negative.   Skin: Negative.   Neurological: Negative.   Psychiatric/Behavioral: Negative.     Allergies  Amoxicillin; Ciprofloxacin; Polocaine; Procaine; Promethazine hcl; Quinidine; Septra; Tiazac; Vicodin; and Warfarin sodium  Home Medications   Current Outpatient Rx  Name Route Sig Dispense Refill  . AMIODARONE HCL 200 MG PO TABS Oral Take 200 mg by mouth daily.    Marland Kitchen AMLODIPINE BESYLATE 5 MG PO TABS Oral Take 5 mg by mouth daily.      Marland Kitchen  ASPIRIN 81 MG PO TABS Oral Take 81 mg by mouth daily.      . ATENOLOL 25 MG PO TABS Oral Take 25 mg by mouth daily.    Marland Kitchen VITAMIN D 2000 UNITS PO CAPS Oral Take 2,000 Units by mouth.     Marland Kitchen CITALOPRAM HYDROBROMIDE 10 MG PO TABS Oral Take 1 tablet (10 mg total) by mouth daily. 30 tablet 5  . FUROSEMIDE 80 MG PO TABS Oral Take 80 mg by mouth 2 (two) times daily.    Marland Kitchen LEVOTHYROXINE SODIUM 88 MCG PO TABS Oral Take 1 tablet (88 mcg total) by mouth daily. 90 tablet 0  . ADULT MULTIVITAMIN W/MINERALS CH Oral Take 1 tablet by mouth daily.      BP 117/71  Pulse 90  Temp(Src) 98 F (36.7 C) (Oral)  Resp 26   SpO2 90%  Physical Exam  Nursing note and vitals reviewed. Constitutional: She is oriented to person, place, and time. She appears well-developed and well-nourished.       Oxygen saturation was 80% on room air upon arrival, and went up to 90% with 2 L of oxygen.  HENT:  Head: Normocephalic and atraumatic.  Right Ear: External ear normal.  Left Ear: External ear normal.  Mouth/Throat: Oropharynx is clear and moist.  Eyes: Conjunctivae and EOM are normal. Pupils are equal, round, and reactive to light.  Neck: Normal range of motion. Neck supple.  Cardiovascular: Normal rate, regular rhythm and normal heart sounds.   Pulmonary/Chest: She has rales.       She has rales up to both shoulders.  Abdominal: Soft. Bowel sounds are normal.  Musculoskeletal: She exhibits edema.       2+ bilateral ankle edema.    Neurological: She is alert and oriented to person, place, and time.       Sensory and motor intact.  Skin: Skin is warm and dry.  Psychiatric: She has a normal mood and affect. Her behavior is normal.    ED Course  Procedures (including critical care time)   Labs Reviewed  COMPREHENSIVE METABOLIC PANEL  D-DIMER, QUANTITATIVE  PRO B NATRIURETIC PEPTIDE   3:27 PM  Pt was seen and physical exam was performed. Lab workup was ordered.  4:13 PM  Date: 09/17/2011  Rate: 82  Rhythm: Electronically paced rhythm. Narrative Interpretation: Abnormal EKG  Old EKG Reviewed: changes noted--Today has ventricular pacing, where on 04/29/2010 her pacing appeared to be atrial pacing.  5:08 PM Chest x-ray worrisome for pneumonia.  Will add a CBC and blood cultures.  6:45 PM Results for orders placed during the hospital encounter of 09/17/11  COMPREHENSIVE METABOLIC PANEL      Component Value Range   Sodium 137  135 - 145 (mEq/L)   Potassium 4.2  3.5 - 5.1 (mEq/L)   Chloride 99  96 - 112 (mEq/L)   CO2 22  19 - 32 (mEq/L)   Glucose, Bld 124 (*) 70 - 99 (mg/dL)   BUN 37 (*) 6 - 23 (mg/dL)    Creatinine, Ser 5.40 (*) 0.50 - 1.10 (mg/dL)   Calcium 9.0  8.4 - 98.1 (mg/dL)   Total Protein 6.4  6.0 - 8.3 (g/dL)   Albumin 3.0 (*) 3.5 - 5.2 (g/dL)   AST 55 (*) 0 - 37 (U/L)   ALT 50 (*) 0 - 35 (U/L)   Alkaline Phosphatase 88  39 - 117 (U/L)   Total Bilirubin 0.4  0.3 - 1.2 (mg/dL)   GFR calc non Af [place] Dago  29 (*) >90 (mL/min)   GFR calc Af Amer 34 (*) >90 (mL/min)  D-DIMER, QUANTITATIVE      Component Value Range   D-Dimer, Quant 1.25 (*) 0.00 - 0.48 (ug/mL-FEU)  PRO B NATRIURETIC PEPTIDE      Component Value Range   Pro B Natriuretic peptide (BNP) 3331.0 (*) 0 - 450 (pg/mL)  POCT I-STAT TROPONIN I      Component Value Range   Troponin i, poc 0.03  0.00 - 0.08 (ng/mL)   Comment 3           CBC      Component Value Range   WBC 14.1 (*) 4.0 - 10.5 (K/uL)   RBC 4.02  3.87 - 5.11 (MIL/uL)   Hemoglobin 12.1  12.0 - 15.0 (g/dL)   HCT 29.5  62.1 - 30.8 (%)   MCV 92.8  78.0 - 100.0 (fL)   MCH 30.1  26.0 - 34.0 (pg)   MCHC 32.4  30.0 - 36.0 (g/dL)   RDW 65.7 (*) 84.6 - 15.5 (%)   Platelets 357  150 - 400 (K/uL)  DIFFERENTIAL      Component Value Range   Neutrophils Relative 84 (*) 43 - 77 (%)   Neutro Abs 11.8 (*) 1.7 - 7.7 (K/uL)   Lymphocytes Relative 5 (*) 12 - 46 (%)   Lymphs Abs 0.7  0.7 - 4.0 (K/uL)   Monocytes Relative 11  3 - 12 (%)   Monocytes Absolute 1.5 (*) 0.1 - 1.0 (K/uL)   Eosinophils Relative 0  0 - 5 (%)   Eosinophils Absolute 0.0  0.0 - 0.7 (K/uL)   Basophils Relative 0  0 - 1 (%)   Basophils Absolute 0.0  0.0 - 0.1 (K/uL)   Dg Chest Port 1 View  09/17/2011  *RADIOLOGY REPORT*  Clinical Data: Severe short of breath  PORTABLE CHEST - 1 VIEW  Comparison: Chest radiograph 09/16/2011  Findings: Left-sided pacemaker overlies stable cardiac silhouette. There is bilateral upper lobe air space disease more dense on the left similar to prior.  There is bibasilar effusions and atelectasis.  IMPRESSION:  1.  No improvement in upper lobe air space disease worse on the  left. 2.  Bilateral pleural effusions  Original Report Authenticated By: Genevive Bi, M.D.    [facility] high at 14,100 with 84% neutrophils. This, along with the infiltrate in the left upper lobe, suggests that her shortness of breath is from pneumonia as well as CHF.  Will call [facility] to admit her for community-acquired pneumonia. NOTE- The clinical picture was not suggestive of pneumonia, which resulted in a delay of the diagnosis of pneumonia at the time of admission.  6:53 PM Discussed antibiotic choice with Pharmacy, where pt is allergic to amoxicillin and Cipro.  They recommended Rocephin and Azithromycin.    1. Community acquired pneumonia   2. Congestive heart failure     Case discussed with Dr. Adela Glimpse, who will admit pt to a telemetry unit.         Carleene Cooper III, MD 09/17/11 [phone]

## 2011-09-18 ENCOUNTER — Inpatient Hospital Stay (HOSPITAL_COMMUNITY): Payer: MEDICARE

## 2011-09-18 ENCOUNTER — Encounter (HOSPITAL_COMMUNITY): Payer: Self-pay | Admitting: Cardiology

## 2011-09-18 DIAGNOSIS — R740 Nonspecific elevation of levels of transaminase and lactic acid dehydrogenase [LDH]: Secondary | ICD-10-CM

## 2011-09-18 DIAGNOSIS — I509 Heart failure, unspecified: Secondary | ICD-10-CM

## 2011-09-18 DIAGNOSIS — R000 Tachycardia, unspecified: Secondary | ICD-10-CM

## 2011-09-18 DIAGNOSIS — R7402 Elevation of levels of lactic acid dehydrogenase (LDH): Secondary | ICD-10-CM

## 2011-09-18 DIAGNOSIS — J9600 Acute respiratory failure, unspecified whether with hypoxia or hypercapnia: Secondary | ICD-10-CM

## 2011-09-18 DIAGNOSIS — D696 Thrombocytopenia, unspecified: Secondary | ICD-10-CM

## 2011-09-18 LAB — URINALYSIS, ROUTINE W REFLEX MICROSCOPIC
Nitrite: NEGATIVE
Specific Gravity, Urine: 1.015 (ref 1.005–1.030)
pH: 5.5 (ref 5.0–8.0)

## 2011-09-18 LAB — COMPREHENSIVE METABOLIC PANEL
AST: 44 U/L — ABNORMAL HIGH (ref 0–37)
Albumin: 2.8 g/dL — ABNORMAL LOW (ref 3.5–5.2)
CO2: 25 mEq/L (ref 19–32)
Calcium: 8.7 mg/dL (ref 8.4–10.5)
Creatinine, Ser: 1.54 mg/dL — ABNORMAL HIGH (ref 0.50–1.10)
GFR calc non Af Amer: 29 mL/min — ABNORMAL LOW (ref >90)
Total Protein: 6.2 g/dL (ref 6.0–8.3)

## 2011-09-18 LAB — DIFFERENTIAL
Basophils Absolute: 0 10*3/uL (ref 0.0–0.1)
Eosinophils Absolute: 0 10*3/uL (ref 0.0–0.7)
Lymphocytes Relative: 4 % — ABNORMAL LOW (ref 12–46)
Lymphs Abs: 0.6 10*3/uL — ABNORMAL LOW (ref 0.7–4.0)
Neutrophils Relative %: 84 % — ABNORMAL HIGH (ref 43–77)

## 2011-09-18 LAB — URINE MICROSCOPIC-ADD ON

## 2011-09-18 LAB — MRSA PCR SCREENING: MRSA by PCR: NEGATIVE

## 2011-09-18 LAB — CBC
Platelets: 342 10*3/uL (ref 150–400)
RBC: 3.99 MIL/uL (ref 3.87–5.11)
WBC: 14.5 10*3/uL — ABNORMAL HIGH (ref 4.0–10.5)

## 2011-09-18 LAB — TSH: TSH: 0.676 u[IU]/mL (ref 0.350–4.500)

## 2011-09-18 LAB — MAGNESIUM: Magnesium: 2 mg/dL (ref 1.5–2.5)

## 2011-09-18 LAB — CARDIAC PANEL(CRET KIN+CKTOT+MB+TROPI)
CK, MB: 3.5 ng/mL (ref 0.3–4.0)
Relative Index: INVALID (ref 0.0–2.5)
Troponin I: <0.3 ng/mL (ref <0.30)

## 2011-09-18 MED ORDER — ATENOLOL 25 MG PO TABS
25.0000 mg | ORAL_TABLET | Freq: Every day | ORAL | Status: DC
  Administered 2011-09-18 – 2011-09-19 (×2): 25 mg via ORAL
  Filled 2011-09-18 (×2): qty 1

## 2011-09-18 MED ORDER — FUROSEMIDE 10 MG/ML IJ SOLN: 40.0000 mg | Freq: Once | INTRAMUSCULAR | Status: AC

## 2011-09-18 MED ORDER — LEVOTHYROXINE SODIUM 88 MCG PO TABS
88.0000 ug | ORAL_TABLET | Freq: Every day | ORAL | Status: DC
  Administered 2011-09-18 – 2011-09-30 (×12): 88 ug via ORAL
  Filled 2011-09-18 (×15): qty 1

## 2011-09-18 MED ORDER — DEXTROSE 5 % IV SOLN
1.0000 g | Freq: Two times a day (BID) | INTRAVENOUS | Status: DC
  Administered 2011-09-18 – 2011-09-19 (×3): 1 g via INTRAVENOUS
  Filled 2011-09-18 (×6): qty 1

## 2011-09-18 MED ORDER — DEXTROSE 5 % IV SOLN
1.0000 g | INTRAVENOUS | Status: DC
  Filled 2011-09-18: qty 10

## 2011-09-18 MED ORDER — MORPHINE SULFATE 2 MG/ML IJ SOLN
2.0000 mg | INTRAMUSCULAR | Status: DC | PRN
  Administered 2011-09-18 – 2011-09-22 (×13): 2 mg via INTRAVENOUS
  Filled 2011-09-18 (×14): qty 1

## 2011-09-18 MED ORDER — AMIODARONE HCL 200 MG PO TABS
200.0000 mg | ORAL_TABLET | Freq: Every day | ORAL | Status: DC
  Administered 2011-09-18 – 2011-09-19 (×2): 200 mg via ORAL
  Filled 2011-09-18 (×2): qty 1

## 2011-09-18 MED ORDER — FUROSEMIDE 10 MG/ML IJ SOLN
80.0000 mg | Freq: Four times a day (QID) | INTRAMUSCULAR | Status: DC
  Administered 2011-09-18 – 2011-09-19 (×4): 80 mg via INTRAVENOUS
  Filled 2011-09-18 (×7): qty 8

## 2011-09-18 MED ORDER — FUROSEMIDE 10 MG/ML IJ SOLN
INTRAMUSCULAR | Status: AC
  Filled 2011-09-18: qty 4

## 2011-09-18 MED ORDER — AZITHROMYCIN 500 MG IV SOLR
500.0000 mg | INTRAVENOUS | Status: AC
  Administered 2011-09-18 – 2011-09-22 (×5): 500 mg via INTRAVENOUS
  Filled 2011-09-18 (×5): qty 500

## 2011-09-18 MED ORDER — AZITHROMYCIN 500 MG PO TABS
500.0000 mg | ORAL_TABLET | ORAL | Status: DC
  Filled 2011-09-18: qty 1

## 2011-09-18 MED ORDER — CITALOPRAM HYDROBROMIDE 10 MG PO TABS
10.0000 mg | ORAL_TABLET | Freq: Every day | ORAL | Status: DC
  Administered 2011-09-18 – 2011-09-30 (×13): 10 mg via ORAL
  Filled 2011-09-18 (×13): qty 1

## 2011-09-18 MED ORDER — ASPIRIN 81 MG PO CHEW
81.0000 mg | CHEWABLE_TABLET | Freq: Every day | ORAL | Status: DC
  Administered 2011-09-18 – 2011-09-30 (×13): 81 mg via ORAL
  Filled 2011-09-18 (×13): qty 1

## 2011-09-18 NOTE — Progress Notes (Signed)
Transferring pt to [facility] [address] as per MD s'  orders. Report called to receiving  RN. Pt  was made aware of transfer. Transported off unit via bed.

## 2011-09-18 NOTE — Progress Notes (Signed)
  Echocardiogram 2D Echocardiogram has been performed.  [name] 09/18/2011, 3:08 PM

## 2011-09-18 NOTE — Progress Notes (Signed)
Pharmacist Heart Failure Core Measure Documentation  Assessment: [name] has an EF documented as 60% in 2008 by ECHO.  Rational: Heart failure patients with left ventricular systolic dysfunction (LVSD) and an EF < 40% should be prescribed an angiotensin converting enzyme inhibitor (ACEI) or angiotensin receptor blocker (ARB) at discharge unless a contraindication is documented in the medical record.  This patient is not currently on an ACEI or ARB for HF.  This note is being placed in the record in order to provide documentation that a contraindication to the use of these agents is present for this encounter.  ACE Inhibitor or Angiotensin Receptor Blocker is contraindicated (specify all that apply)  []   ACEI allergy AND ARB allergy []   Angioedema []   Moderate or severe aortic stenosis []   Hyperkalemia []   Hypotension []   Renal artery stenosis [x]   Worsening renal function, preexisting renal disease or dysfunction   [name] 09/18/2011 8:02 AM

## 2011-09-18 NOTE — Progress Notes (Signed)
[facility] PCP:  Carollee Herter, MD, MD  Cardiology: Verdis Prime  Interim history: Presented with dyspnea on exertion for 1 week and worsening peripheral edema but progressively getting worse was started by Dr. Katrinka Blazing on Lasix and was titrated up to 120 BID still no improvement in edema. CXR as outpt revealed LUL infiltrate which has worsened.   Subjective: Noted to be very short of breath. C/o cough with sputum for few days but not sure of color of sputum. No fevers or chills. Poor historian due to fatigue- difficult to get more history. Spoke with her daughter who is an [employer] fundraiser- she states pt has not had a recent echo and despite titrating up lasix, she did not have much urine output and pedal edema did not improve.   Objective: Blood pressure 117/60, pulse 93, temperature 97.6 F (36.4 C), temperature source Axillary, resp. rate 25, height 5\' 5"  (1.651 m), weight 65 kg (143 lb 4.8 oz), SpO2 92.00%. Weight change:   Intake/Output Summary (Last 24 hours) at 09/18/11 1806 Last data filed at 09/18/11 1600  Gross per 24 hour  Intake    210 ml  Output   1410 ml  Net  -1200 ml    Physical Exam: General appearance: alert, cooperative and moderate distress Lungs: b/l crackles including lower and mid lung fields; on 100% nonrebreather - pulse ox 90-92% Heart: regular rate and rhythm, S1, S2 normal Abdomen: soft, non-tender; bowel sounds normal; no masses,  no organomegaly Extremities: edema pitting  Lab Results:  Basename 09/18/11 0146 09/17/11 2036 09/17/11 1527  NA 140 -- 137  K 3.8 -- 4.2  CL 100 -- 99  CO2 25 -- 22  GLUCOSE 90 -- 124*  BUN 34* -- 37*  CREATININE 1.54* 1.50* --  CALCIUM 8.7 -- 9.0  MG 2.0 -- --  PHOS -- -- --    Basename 09/18/11 0146 09/17/11 1527  AST 44* 55*  ALT 48* 50*  ALKPHOS 89 88  BILITOT 0.3 0.4  PROT 6.2 6.4  ALBUMIN 2.8* 3.0*   No results found for this basename: LIPASE:2,AMYLASE:2 in the last 72 hours  Basename 09/18/11 0430  09/17/11 2036 09/17/11 1657  WBC 14.5* 13.0* --  NEUTROABS 12.2* -- 11.8*  HGB 12.1 11.6* --  HCT 36.9 35.1* --  MCV 92.5 91.9 --  PLT 342 343 --    Basename 09/18/11 0954 09/18/11 0146 09/17/11 2035  CKTOTAL 71 74 88  CKMB 3.5 3.5 3.4  CKMBINDEX -- -- --  TROPONINI <0.30 <0.30 <0.30   No components found with this basename: POCBNP:3  Basename 09/17/11 1527  DDIMER 1.25*   No results found for this basename: HGBA1C:2 in the last 72 hours No results found for this basename: CHOL:2,HDL:2,LDLCALC:2,TRIG:2,CHOLHDL:2,LDLDIRECT:2 in the last 72 hours  Basename 09/18/11 0146  TSH 0.676  T4TOTAL --  T3FREE --  THYROIDAB --   No results found for this basename: VITAMINB12:2,FOLATE:2,FERRITIN:2,TIBC:2,IRON:2,RETICCTPCT:2 in the last 72 hours  Micro Results: Recent Results (from the past 240 hour(s))  MRSA PCR SCREENING     Status: Normal   Collection Time   09/18/11  4:38 AM      Component Value Range Status Comment   MRSA by PCR NEGATIVE  NEGATIVE  Final     Studies/Results: Dg Chest Port 1 View  09/18/2011  *RADIOLOGY REPORT*  Clinical Data: Respiratory distress  PORTABLE CHEST - 1 VIEW  Comparison: Chest radiograph 09/17/2011  Findings: Left-sided pacemaker overlies stable enlarged heart silhouette.  There  is increased left  lower lobe atelectasis and effusion. Dense e upper lobe air space disease on the left into the six in the right.  IMPRESSION: 1.  Increase in left lower lobe atelectasis and effusion. 2.  Dense upper lobe air space disease.  Original Report Authenticated By: Genevive Bi, M.D.   Dg Chest Port 1 View  09/17/2011  *RADIOLOGY REPORT*  Clinical Data: Severe short of breath  PORTABLE CHEST - 1 VIEW  Comparison: Chest radiograph 09/16/2011  Findings: Left-sided pacemaker overlies stable cardiac silhouette. There is bilateral upper lobe air space disease more dense on the left similar to prior.  There is bibasilar effusions and atelectasis.  IMPRESSION:  1.  No  improvement in upper lobe air space disease worse on the left. 2.  Bilateral pleural effusions  Original Report Authenticated By: Genevive Bi, M.D.    Medications: Scheduled Meds:   . amiodarone  200 mg Oral Daily  . aspirin  81 mg Oral Daily  . atenolol  25 mg Oral Daily  . azithromycin  500 mg Intravenous Once  . azithromycin  500 mg Intravenous Q24H  . ceFEPime (MAXIPIME) IV  1 g Intravenous Q12H  . cefTRIAXone (ROCEPHIN)  IV  1 g Intravenous Once  . citalopram  10 mg Oral Daily  . enoxaparin  30 mg Subcutaneous Q24H  . furosemide  40 mg Intravenous Once  . furosemide  40 mg Intravenous Once  . furosemide  80 mg Intravenous Q6H  . levothyroxine  88 mcg Oral Q0600  . sodium chloride  3 mL Intravenous Q12H  . DISCONTD: azithromycin  500 mg Oral Q24H  . DISCONTD: cefTRIAXone (ROCEPHIN)  IV  1 g Intravenous Q24H  . DISCONTD: furosemide  40 mg Intravenous BID   Continuous Infusions:   . DISCONTD: sodium chloride     PRN Meds:.sodium chloride, acetaminophen, morphine injection, ondansetron (ZOFRAN) IV, sodium chloride  Assessment/Plan: Acute resp failure On 100 % NRB- likely will tire from tachypenia- advising to try Bipap.   f/u on ECHO- not much urine output- may be dehydrated - cont Lasix for today- If not much urine output and ECHO neg, can d/c tomorrow Cards eval requested   CAP (community acquired pneumonia)/ LUL Zithro and Cefepime- sputum cx, urine strep negative   Abnormal LFTs Follow- may be from sepsis  Pacemaker  A-fib Cont Amiodorone and Atenolol- rate is controlled  Code Status- DNR FAmily communiction- spoke with daughter and caretaker  LOS: 1 day   [facility] [phone] 09/18/2011, 6:06 PM

## 2011-09-18 NOTE — Consult Note (Signed)
CARDIOLOGY CONSULT NOTE  Patient ID: [name] MRN: [id number] DOB/AGE: [birth year] 76 y.o.  Admit date: 09/17/2011 Primary Physician Carollee Herter, MD Primary Cardiologist  Dr. Garnette Scheuermann Chief Complaint  Dyspnea  HPI:  The patient with progressive dyspnea and edema.  She was treated as an outpatient with increasing PO diuretics.  However she came to the ER with worsening dypsnea.  No acute EKG changes and cardiac enzymes are negative.  D dimer was elevated.  ProBNP is 3331.   CXR with upper lobe air space disease and bilateral pleural effusions.  In the ER she was treated with IV Lasix and Rocephin.  Since being admitted to the step down unit and was tried on BiPAP.  However she didn't tolerate this.  She currently has sats above 90% with a non rebreather mask.  She reports no history of MI but a history of HF.  I do not know her EF and she doesn't recall a recent echo. She has no history cath.  She reports increasing dyspnea x 5 - 6 days.  She did not improve after seeing Dr. Katrinka Blazing on Friday and having increased diuresis.  She has had orthopnea but denies chest or neck pain.  She has some right are arthritis.  She has had no new palpitations.  She has mild ankle edema but no weight gain.  She watches salt and fluid.  She is denying fevers or chills and has had only a non productive cough.  Past Medical History  Diagnosis Date  . Hypertension   . Arthritis     RT HIP  . Osteoporosis   . Renal insufficiency   . Atrial fib/flutter, transient   . Thyroid disease     Past Surgical History  Procedure Date  . Joint replacement      RT HIP REPLACEMENT  . Knee arthroscopy     right  . Pacemaker insertion     Allergies  Allergen Reactions  . Amoxicillin Hives  . Ciprofloxacin Hives  . Polocaine (Mepivacaine Hcl) Hives  . Procaine Hives  . Promethazine Hcl Hives  . Quinidine   . Septra (Sulfamethoxazole W/Trimethoprim (Co-Trimoxazole)) Other (See Comments)    Dizziness  and tremor  . Tiazac (Diltiazem Hcl) Hives  . Vicodin (Hydrocodone-Acetaminophen) Hives  . Warfarin Sodium     GI bleed   Prescriptions prior to admission  Medication Sig Dispense Refill  . amiodarone (PACERONE) 200 MG tablet Take 200 mg by mouth daily.      Marland Kitchen amLODipine (NORVASC) 5 MG tablet Take 5 mg by mouth daily.        Marland Kitchen aspirin 81 MG tablet Take 81 mg by mouth daily.        Marland Kitchen atenolol (TENORMIN) 25 MG tablet Take 25 mg by mouth daily.      . Cholecalciferol (VITAMIN D) 2000 UNITS CAPS Take 2,000 Units by mouth.       . citalopram (CELEXA) 10 MG tablet Take 1 tablet (10 mg total) by mouth daily.  30 tablet  5  . furosemide (LASIX) 80 MG tablet Take 80 mg by mouth 2 (two) times daily.      Marland Kitchen levothyroxine (SYNTHROID) 88 MCG tablet Take 1 tablet (88 mcg total) by mouth daily.  90 tablet  0  . Multiple Vitamin (MULITIVITAMIN WITH MINERALS) TABS Take 1 tablet by mouth daily.       Family History  Problem Relation Age of Onset  . Hypertension Mother   . Heart disease Mother  History   Social History  . Marital Status: Married    Spouse Name: N/A    Number of Children: N/A  . Years of Education: N/A   Occupational History  . Not on file.   Social History Main Topics  . Smoking status: Never Smoker   . Smokeless tobacco: Never Used  . Alcohol Use: No  . Drug Use: No  . Sexually Active: Not on file   Other Topics Concern  . Not on file   Social History Narrative  . No narrative on file     ROS:  As stated in the HPI and negative for all other systems.  Physical Exam: Blood pressure 122/64, pulse 86, temperature 97.5 F (36.4 C), temperature source Oral, resp. rate 19, height 5\' 5"  (1.651 m), weight 65 kg (143 lb 4.8 oz), SpO2 94.00%.  GENERAL:  Frail appearing HEENT:  Pupils equal round and reactive, fundi not visualized, oral mucosa unremarkable NECK:  No jugular venous distention, waveform within normal limits, carotid upstroke brisk and symmetric, no bruits,  no thyromegaly LYMPHATICS:  No cervical, inguinal adenopathy LUNGS:  Bilateral diffuse crackles. BACK:  No CVA tenderness CHEST:  Pacemaker pocket HEART:  PMI not displaced or sustained,S1 and S2 within normal limits, no S3, no S4, no clicks, no rubs, no murmurs, irregular ABD:  Flat, positive bowel sounds normal in frequency in pitch, no bruits, no rebound, no guarding, no midline pulsatile mass, no hepatomegaly, no splenomegaly EXT:  2 plus pulses throughout, trace ankle edema, no cyanosis no clubbing SKIN:  No rashes no nodules NEURO:  Cranial nerves II through XII grossly intact, motor grossly intact throughout PSYCH:  Cognitively intact, oriented to person place and time   Labs: Lab Results  Component Value Date   BUN 34* 09/18/2011   Lab Results  Component Value Date   CREATININE 1.54* 09/18/2011   Lab Results  Component Value Date   NA 140 09/18/2011   K 3.8 09/18/2011   CL 100 09/18/2011   CO2 25 09/18/2011   Lab Results  Component Value Date   CKTOTAL 74 09/18/2011   CKMB 3.5 09/18/2011   TROPONINI <0.30 09/18/2011   Lab Results  Component Value Date   WBC 14.5* 09/18/2011   HGB 12.1 09/18/2011   HCT 36.9 09/18/2011   MCV 92.5 09/18/2011   PLT 342 09/18/2011   No results found for this basename: CHOL, HDL, LDLCALC, LDLDIRECT, TRIG, CHOLHDL   Lab Results  Component Value Date   ALT 48* 09/18/2011   AST 44* 09/18/2011   ALKPHOS 89 09/18/2011   BILITOT 0.3 09/18/2011   Radiology:  CXR:  1. No improvement in upper lobe air space disease worse on the Left.  2. Bilateral pleural effusions  [id number] flutter with ventricular pacing.  ASSESSMENT AND PLAN:   1) Respiratory distress:  Clearly CHF with possible pneumonia.  Cycle enzymes.  I agree with gentle diuresis.  We will order an echocardiogram.    2)  RI:  Acute on chronic.  Follow BMET closely with diuresis  3)  Atrial flutter/fib:  Paroxysmal with permanent pacemaker.  She is not on home anticoagulation and we  will need to discuss this with Dr. Katrinka Blazing to see if there has been a contraindication.  She would clearly have an indication for this.  She is on amiodarone. There is a possibility that her infiltrates could be related to this.  We will keep this in mind if she does not improve with diuresis and antibiotics.  SignedRollene Rotunda 09/18/2011, 10:26 AM

## 2011-09-18 NOTE — Progress Notes (Signed)
Pt placed on NIV for work of breathing.

## 2011-09-19 DIAGNOSIS — R000 Tachycardia, unspecified: Secondary | ICD-10-CM

## 2011-09-19 DIAGNOSIS — J9600 Acute respiratory failure, unspecified whether with hypoxia or hypercapnia: Secondary | ICD-10-CM

## 2011-09-19 DIAGNOSIS — D696 Thrombocytopenia, unspecified: Secondary | ICD-10-CM

## 2011-09-19 DIAGNOSIS — R740 Nonspecific elevation of levels of transaminase and lactic acid dehydrogenase [LDH]: Secondary | ICD-10-CM

## 2011-09-19 LAB — LEGIONELLA ANTIGEN, URINE

## 2011-09-19 LAB — CBC
HCT: 36.3 % (ref 36.0–46.0)
Hemoglobin: 11.7 g/dL — ABNORMAL LOW (ref 12.0–15.0)
MCHC: 32.2 g/dL (ref 30.0–36.0)
MCV: 93.1 fL (ref 78.0–100.0)
RDW: 15.6 % — ABNORMAL HIGH (ref 11.5–15.5)

## 2011-09-19 LAB — BASIC METABOLIC PANEL WITH GFR
BUN: 34 mg/dL — ABNORMAL HIGH (ref 6–23)
CO2: 26 meq/L (ref 19–32)
Calcium: 8.5 mg/dL (ref 8.4–10.5)
Chloride: 98 meq/L (ref 96–112)
Creatinine, Ser: 1.52 mg/dL — ABNORMAL HIGH (ref 0.50–1.10)
GFR calc Af Amer: 34 mL/min — ABNORMAL LOW (ref >90)
GFR calc non Af Amer: 29 mL/min — ABNORMAL LOW (ref >90)
Glucose, Bld: 104 mg/dL — ABNORMAL HIGH (ref 70–99)
Potassium: 2.8 meq/L — ABNORMAL LOW (ref 3.5–5.1)
Sodium: 137 meq/L (ref 135–145)

## 2011-09-19 LAB — SEDIMENTATION RATE: Sed Rate: 47 mm/h — ABNORMAL HIGH (ref 0–22)

## 2011-09-19 MED ORDER — VANCOMYCIN HCL 1000 MG IV SOLR
750.0000 mg | INTRAVENOUS | Status: DC
  Administered 2011-09-19 – 2011-09-20 (×2): 750 mg via INTRAVENOUS
  Filled 2011-09-19 (×3): qty 750

## 2011-09-19 MED ORDER — PIPERACILLIN-TAZOBACTAM IN DEX 2-0.25 GM/50ML IV SOLN
2.2500 g | Freq: Four times a day (QID) | INTRAVENOUS | Status: DC
  Administered 2011-09-19 – 2011-09-22 (×10): 2.25 g via INTRAVENOUS
  Filled 2011-09-19 (×12): qty 50

## 2011-09-19 MED ORDER — POTASSIUM CHLORIDE CRYS ER 20 MEQ PO TBCR
40.0000 meq | EXTENDED_RELEASE_TABLET | Freq: Three times a day (TID) | ORAL | Status: DC
  Administered 2011-09-19 (×2): 40 meq via ORAL
  Filled 2011-09-19 (×3): qty 2

## 2011-09-19 MED ORDER — SENNA 8.6 MG PO TABS
2.0000 | ORAL_TABLET | Freq: Every day | ORAL | Status: DC
  Administered 2011-09-19: 17.2 mg via ORAL
  Filled 2011-09-19 (×11): qty 2

## 2011-09-19 MED ORDER — WHITE PETROLATUM GEL
Status: AC
  Administered 2011-09-19: 2
  Filled 2011-09-19: qty 5

## 2011-09-19 MED ORDER — DEXTROSE 5 % IV SOLN
120.0000 mg | Freq: Three times a day (TID) | INTRAVENOUS | Status: DC
  Administered 2011-09-19 – 2011-09-20 (×3): 120 mg via INTRAVENOUS
  Filled 2011-09-19 (×5): qty 12

## 2011-09-19 MED ORDER — ATENOLOL 25 MG PO TABS
25.0000 mg | ORAL_TABLET | Freq: Two times a day (BID) | ORAL | Status: DC
  Administered 2011-09-19 – 2011-09-21 (×4): 25 mg via ORAL
  Filled 2011-09-19 (×5): qty 1

## 2011-09-19 MED ORDER — MAGNESIUM CITRATE PO SOLN
1.0000 | Freq: Every day | ORAL | Status: DC | PRN
  Filled 2011-09-19: qty 296

## 2011-09-19 MED ORDER — POLYETHYLENE GLYCOL 3350 17 G PO PACK
17.0000 g | PACK | Freq: Every day | ORAL | Status: DC
  Filled 2011-09-19 (×6): qty 1

## 2011-09-19 MED ORDER — DEXTROSE 5 % IV SOLN: 500.0000 mg | INTRAVENOUS | Status: DC

## 2011-09-19 MED ORDER — BISACODYL 10 MG RE SUPP: 10.0000 mg | Freq: Every day | RECTAL | Status: DC | PRN

## 2011-09-19 MED ORDER — METOLAZONE 2.5 MG PO TABS
2.5000 mg | ORAL_TABLET | Freq: Once | ORAL | Status: AC
  Administered 2011-09-19: 2.5 mg via ORAL
  Filled 2011-09-19: qty 1

## 2011-09-19 NOTE — Progress Notes (Signed)
ANTIBIOTIC CONSULT NOTE - FOLLOW UP  Pharmacy Consult for Adjustment of antibiotics for renal function, Cefepime Indication: pneumonia  Allergies  Allergen Reactions  . Amoxicillin Hives  . Ciprofloxacin Hives  . Polocaine (Mepivacaine Hcl) Hives  . Procaine Hives  . Promethazine Hcl Hives  . Quinidine   . Septra (Sulfamethoxazole W/Trimethoprim (Co-Trimoxazole)) Other (See Comments)    Dizziness and tremor  . Tiazac (Diltiazem Hcl) Hives  . Vicodin (Hydrocodone-Acetaminophen) Hives  . Warfarin Sodium     GI bleed    Patient Measurements: Height: 5\' 5"  (165.1 cm) Weight: 143 lb 15.4 oz (65.3 kg) IBW/kg (Calculated) : 57  Adjusted Body Weight:   Vital Signs: Temp: 98.6 F (37 C) (05/13 0729) Temp src: Axillary (05/13 0729) BP: 101/68 mmHg (05/13 0729) Pulse Rate: 90  (05/13 0940) Intake/Output from previous day: 05/12 0701 - 05/13 0700 In: 644 [P.O.:270; IV Piggyback:374] Out: 1475 [Urine:1475] Intake/Output from this shift: Total I/O In: -  Out: 350 [Urine:350]  Labs:  [facility] 09/19/11 0438 09/18/11 0430 09/18/11 0146 09/17/11 2036  WBC 15.5* 14.5* -- 13.0*  HGB 11.7* 12.1 -- 11.6*  PLT 337 342 -- 343  LABCREA -- -- -- --  CREATININE 1.52* -- 1.54* 1.50*   Estimated Creatinine Clearance: 22.6 ml/min (by C-G formula based on Cr of 1.52). No results found for this basename: VANCOTROUGH:2,VANCOPEAK:2,VANCORANDOM:2,GENTTROUGH:2,GENTPEAK:2,GENTRANDOM:2,TOBRATROUGH:2,TOBRAPEAK:2,TOBRARND:2,AMIKACINPEAK:2,AMIKACINTROU:2,AMIKACIN:2, in the last 72 hours   Microbiology: Recent Results (from the past 720 hour(s))  CULTURE, BLOOD (ROUTINE X 2)     Status: Normal (Preliminary result)   Collection Time   09/17/11  5:19 PM      Component Value Range Status Comment   Specimen Description BLOOD LEFT ARM   Final    Special Requests BOTTLES DRAWN AEROBIC AND ANAEROBIC 10CC   Final    Culture  Setup Time [id number]   Final    Culture     Final    Value:        BLOOD  CULTURE RECEIVED NO GROWTH TO DATE CULTURE WILL BE HELD FOR 5 DAYS BEFORE ISSUING A FINAL NEGATIVE REPORT   Report Status PENDING   Incomplete   CULTURE, BLOOD (ROUTINE X 2)     Status: Normal (Preliminary result)   Collection Time   09/17/11  5:23 PM      Component Value Range Status Comment   Specimen Description BLOOD RIGHT ARM   Final    Special Requests BOTTLES DRAWN AEROBIC ONLY 5CC   Final    Culture  Setup Time [id number]   Final    Culture     Final    Value:        BLOOD CULTURE RECEIVED NO GROWTH TO DATE CULTURE WILL BE HELD FOR 5 DAYS BEFORE ISSUING A FINAL NEGATIVE REPORT   Report Status PENDING   Incomplete   MRSA PCR SCREENING     Status: Normal   Collection Time   09/18/11  4:38 AM      Component Value Range Status Comment   MRSA by PCR NEGATIVE  NEGATIVE  Final     Anti-infectives     Start     Dose/Rate Route Frequency Ordered Stop   09/20/11 1000   ceFEPIme (MAXIPIME) 500 mg in dextrose 5 % 50 mL IVPB        500 mg 100 mL/hr over 30 Minutes Intravenous Every 24 hours 09/19/11 1021     09/18/11 2000   azithromycin (ZITHROMAX) tablet 500 mg  Status:  Discontinued  500 mg Oral Every 24 hours 09/18/11 0303 09/18/11 1012   09/18/11 1900   azithromycin (ZITHROMAX) 500 mg in dextrose 5 % 250 mL IVPB        500 mg 250 mL/hr over 60 Minutes Intravenous Every 24 hours 09/18/11 1012     09/18/11 1800   cefTRIAXone (ROCEPHIN) 1 g in dextrose 5 % 50 mL IVPB  Status:  Discontinued        1 g 100 mL/hr over 30 Minutes Intravenous Every 24 hours 09/18/11 0303 09/18/11 1007   09/18/11 1100   ceFEPIme (MAXIPIME) 1 g in dextrose 5 % 50 mL IVPB  Status:  Discontinued        1 g 100 mL/hr over 30 Minutes Intravenous Every 12 hours 09/18/11 1007 09/19/11 1020   09/17/11 1900   cefTRIAXone (ROCEPHIN) 1 g in dextrose 5 % 50 mL IVPB        1 g 100 mL/hr over 30 Minutes Intravenous  Once 09/17/11 1852 09/17/11 1938   09/17/11 1900   azithromycin (ZITHROMAX) 500 mg in  dextrose 5 % 250 mL IVPB        500 mg 250 mL/hr over 60 Minutes Intravenous  Once 09/17/11 1852 09/17/11 2127          Assessment: 78 yo lady on maxipime and azithromycin for CAP.  Her CrCl~23 ml/min  Goal of Therapy:  Appropriate antibiotic therapy  Plan:  Change maxipime to 500 mg IV q24 hours. F/u clinical progress, renal function and cultures.  [name] 09/19/2011,10:23 AM

## 2011-09-19 NOTE — Progress Notes (Signed)
Patient Name: [name] Date of Encounter: 09/19/2011    SUBJECTIVE: She has significant shortness of breath on room air and is now on BiPAP. She denies chest pain.  Important historical features are that she has a history of atrial flutter in the past. She has had diastolic heart failure in the past related to atrial arrhythmias. She is chronically been on amiodarone therapy and in May of 2012 was decreased to 100 mg per day. On April 11 of 2013 she developed atrial fibrillation/flutter. We embarked upon a rate control strategy. We increased amiodarone to 200 mg per day, and continued atenolol. In reviewing the records at [facility] cardiology, over the past 3 weeks she has developed progressive shortness of breath with lower extremity edema. Shortness of breath and edema have not improved with progressively increased oral Lasix therapy.  The patient has not been on anticoagulation therapy since 2005 when she had a spontaneous intraperitoneal hemorrhage. She has refused anticoagulation therapy since that time including new agents such as factor X A and direct thrombin inhibitors.  TELEMETRY:  Atrial fib/flutter with intermittent ventricular pacing: Filed Vitals:   09/19/11 0729 09/19/11 0940 09/19/11 1105 09/19/11 1136  BP: 101/68   127/67  Pulse: 85 90 83 83  Temp: 98.6 F (37 C)   97.5 F (36.4 C)  TempSrc: Axillary   Oral  Resp: 25 29 28 20   Height:      Weight:      SpO2: 88% 93% 88% 95%    Intake/Output Summary (Last 24 hours) at 09/19/11 1149 Last data filed at 09/19/11 1100  Gross per 24 hour  Intake   1264 ml  Output   1625 ml  Net   -361 ml    LABS: Basic Metabolic Panel:  Basename 09/19/11 0438 09/18/11 0146  NA 137 140  K 2.8* 3.8  CL 98 100  CO2 26 25  GLUCOSE 104* 90  BUN 34* 34*  CREATININE 1.52* 1.54*  CALCIUM 8.5 8.7  MG -- 2.0  PHOS -- --   CBC:  Basename 09/19/11 0438 09/18/11 0430 09/17/11 1657  WBC 15.5* 14.5* --  NEUTROABS -- 12.2* 11.8*    HGB 11.7* 12.1 --  HCT 36.3 36.9 --  MCV 93.1 92.5 --  PLT 337 342 --   Cardiac Enzymes:  Basename 09/18/11 0954 09/18/11 0146 09/17/11 2035  CKTOTAL 71 74 88  CKMB 3.5 3.5 3.4  CKMBINDEX -- -- --  TROPONINI <0.30 <0.30 <0.30   ECHO: 09/18/11 Study Conclusions  - Procedure narrative: Transthoracic echocardiography. Image quality was adequate. The study was technically difficult. - Left ventricle: The cavity size was normal. There was mild focal basal hypertrophy of the septum. Systolic function was normal. The estimated ejection fraction was in the range of 55% to 60%. Although no diagnostic regional wall motion abnormality was identified, this possibility cannot be completely excluded on the basis of this study. - Mitral valve: Calcified annulus. Mildly thickened leaflets . - Left atrium: The atrium was mildly dilated. - Right ventricle: The cavity size was mildly dilated. Wall thickness was normal. - Pulmonary arteries: Systolic pressure was mildly increased. PA peak pressure: 40mm Hg (S). - Pericardium, extracardiac: There was a left pleural effusion.  BNP: 3331 on 5/11  Radiology/Studies:  IMPRESSION:  1. Increase in left lower lobe atelectasis and effusion.  2. Dense upper lobe air space disease.  Original Report Authenticated By: Genevive Bi, M.D.   Physical Exam: Blood pressure 127/67, pulse 83, temperature 97.5 F (36.4  C), temperature source Oral, resp. rate 20, height 5\' 5"  (1.651 m), weight 65.3 kg (143 lb 15.4 oz), SpO2 95.00%. Weight change: 1.343 kg (2 lb 15.4 oz)   Anterior and posterior rales.  Irregular rhythm  No edema  Wearing BIPAP mask  ASSESSMENT:  1. Probable acute on chronic diastolic heart failure due to onset of atrial fibrillation 1 month ago. There is a good temporal relationship.  2. Acute respiratory therapy despite increased diuretic therapy. R/O amiodarone toxicity.  3. Elderly and frail.  4. Severe hypokalemia    Plan:  1.Stop amiodarone 2. Check ESR 3. Aggressive diuresis 4. Replete potassium 5. Fluid restriction 6. Poor prognosis   Signed, Lesleigh Noe 09/19/2011, 11:49 AM

## 2011-09-19 NOTE — Progress Notes (Signed)
ANTIBIOTIC CONSULT NOTE - INITIAL  Pharmacy Consult for Vancomycin and Zosyn Indication: empiric tx of ? community acquired MRSA PNA   Allergies  Allergen Reactions  . Amoxicillin Hives  . Ciprofloxacin Hives  . Polocaine (Mepivacaine Hcl) Hives  . Procaine Hives  . Promethazine Hcl Hives  . Quinidine   . Septra (Sulfamethoxazole W/Trimethoprim (Co-Trimoxazole)) Other (See Comments)    Dizziness and tremor  . Tiazac (Diltiazem Hcl) Hives  . Vicodin (Hydrocodone-Acetaminophen) Hives  . Warfarin Sodium     GI bleed    Patient Measurements: Height: 5\' 5"  (165.1 cm) Weight: 143 lb 15.4 oz (65.3 kg) IBW/kg (Calculated) : 57    Vital Signs: Temp: 97.9 F (36.6 C) (05/13 1600) Temp src: Oral (05/13 1600) BP: 127/67 mmHg (05/13 1136) Pulse Rate: 74  (05/13 1818) Intake/Output from previous day: 05/12 0701 - 05/13 0700 In: 644 [P.O.:270; IV Piggyback:374] Out: 1475 [Urine:1475] Intake/Output from this shift: Total I/O In: 1030 [P.O.:480; Other:550] Out: 650 [Urine:650]  Labs:  [facility] 09/19/11 0438 09/18/11 0430 09/18/11 0146 09/17/11 2036  WBC 15.5* 14.5* -- 13.0*  HGB 11.7* 12.1 -- 11.6*  PLT 337 342 -- 343  LABCREA -- -- -- --  CREATININE 1.52* -- 1.54* 1.50*   Estimated Creatinine Clearance: 22.6 ml/min (by C-G formula based on Cr of 1.52). No results found for this basename: VANCOTROUGH:2,VANCOPEAK:2,VANCORANDOM:2,GENTTROUGH:2,GENTPEAK:2,GENTRANDOM:2,TOBRATROUGH:2,TOBRAPEAK:2,TOBRARND:2,AMIKACINPEAK:2,AMIKACINTROU:2,AMIKACIN:2, in the last 72 hours   Microbiology: Recent Results (from the past 720 hour(s))  CULTURE, BLOOD (ROUTINE X 2)     Status: Normal (Preliminary result)   Collection Time   09/17/11  5:19 PM      Component Value Range Status Comment   Specimen Description BLOOD LEFT ARM   Final    Special Requests BOTTLES DRAWN AEROBIC AND ANAEROBIC 10CC   Final    Culture  Setup Time [id number]   Final    Culture     Final    Value:        BLOOD  CULTURE RECEIVED NO GROWTH TO DATE CULTURE WILL BE HELD FOR 5 DAYS BEFORE ISSUING A FINAL NEGATIVE REPORT   Report Status PENDING   Incomplete   CULTURE, BLOOD (ROUTINE X 2)     Status: Normal (Preliminary result)   Collection Time   09/17/11  5:23 PM      Component Value Range Status Comment   Specimen Description BLOOD RIGHT ARM   Final    Special Requests BOTTLES DRAWN AEROBIC ONLY 5CC   Final    Culture  Setup Time [id number]   Final    Culture     Final    Value:        BLOOD CULTURE RECEIVED NO GROWTH TO DATE CULTURE WILL BE HELD FOR 5 DAYS BEFORE ISSUING A FINAL NEGATIVE REPORT   Report Status PENDING   Incomplete   MRSA PCR SCREENING     Status: Normal   Collection Time   09/18/11  4:38 AM      Component Value Range Status Comment   MRSA by PCR NEGATIVE  NEGATIVE  Final     Medical History: Past Medical History  Diagnosis Date  . Hypertension   . Arthritis     RT HIP  . Osteoporosis   . Renal insufficiency   . Atrial fib/flutter, transient   . Thyroid disease     Medications:  Scheduled:    . aspirin  81 mg Oral Daily  . atenolol  25 mg Oral BID  . azithromycin  500 mg Intravenous  Q24H  . citalopram  10 mg Oral Daily  . enoxaparin  30 mg Subcutaneous Q24H  . furosemide  120 mg Intravenous Q8H  . levothyroxine  88 mcg Oral Q0600  . metolazone  2.5 mg Oral Once  . polyethylene glycol  17 g Oral Daily  . potassium chloride  40 mEq Oral TID  . senna  2 tablet Oral QHS  . sodium chloride  3 mL Intravenous Q12H  . white petrolatum      . DISCONTD: amiodarone  200 mg Oral Daily  . DISCONTD: atenolol  25 mg Oral Daily  . DISCONTD: ceFEPime (MAXIPIME) IV  1 g Intravenous Q12H  . DISCONTD: ceFEPime (MAXIPIME) IV  500 mg Intravenous Q24H  . DISCONTD: furosemide  80 mg Intravenous Q6H   Assessment: 78 y.o. Female with possible CAP and renal insufficiency.  Changing antibiotics from Cefepime to Vancomycin and Zosyn.  Continuing Azithromycin.   Goal of Therapy:    Vancomycin trough level 15-20 mcg/ml  Plan:  Zosyn 2.25g IV q6h Vancomycin 750mg  IV q24h Monitor renal function, renal function and check vancomycin steady state trough per protocol.   Noah Delaine, RPh Clinical Pharmacist 09/19/2011,6:38 PM

## 2011-09-19 NOTE — Progress Notes (Signed)
[facility] [place] TEAM 1 - Stepdown/ICU TEAM  PCP:  Carollee Herter, MD, MD  Subjective: 78 y.o. female who presented with progressive dyspnea for 3 weeks associated with worsening peripheral edema.  She was started by Dr. Katrinka Blazing on Lasix and this was titrated up to 80 BID, but she still experienced no improvement in her symptoms.   The pt states that she does not feel much better today.  She c/o constipation. She denies new sx, but states that her sob is unchanged.  She has required numerous episodes of BIPAP support today due to recurring hypoxic spells.    Objective:  Intake/Output Summary (Last 24 hours) at 09/19/11 1503 Last data filed at 09/19/11 1343  Gross per 24 hour  Intake   1504 ml  Output   1525 ml  Net    -21 ml   Blood pressure 127/67, pulse 83, temperature 97.5 F (36.4 C), temperature source Oral, resp. rate 20, height 5\' 5"  (1.651 m), weight 65.3 kg (143 lb 15.4 oz), SpO2 95.00%.  Physical Exam: General: modest resp distress at rest - able to complete sentences, but has to pause for breaths intermittently Lungs: course crackles th/o wh/o wheeze Cardiovascular: distant - regular rate - no gallup or rub appreciably Abdomen: Nontender, nondistended, soft, bowel sounds positive, no rebound, no ascites, no appreciable mass Extremities: 2+ B le edema  Lab Results:  [facility] 09/19/11 0438 09/18/11 0146 09/17/11 2036 09/17/11 1527  NA 137 140 -- 137  K 2.8* 3.8 -- 4.2  CL 98 100 -- 99  CO2 26 25 -- 22  GLUCOSE 104* 90 -- 124*  BUN 34* 34* -- 37*  CREATININE 1.52* 1.54* 1.50* --  CALCIUM 8.5 8.7 -- 9.0  MG -- 2.0 -- --  PHOS -- -- -- --    Basename 09/18/11 0146 09/17/11 1527  AST 44* 55*  ALT 48* 50*  ALKPHOS 89 88  BILITOT 0.3 0.4  PROT 6.2 6.4  ALBUMIN 2.8* 3.0*    Basename 09/19/11 0438 09/18/11 0430 09/17/11 2036 09/17/11 1657  WBC 15.5* 14.5* 13.0* --  NEUTROABS -- 12.2* -- 11.8*  HGB 11.7* 12.1 11.6* --  HCT 36.3 36.9 35.1* --    MCV 93.1 92.5 91.9 --  PLT 337 342 343 --    Basename 09/18/11 0954 09/18/11 0146 09/17/11 2035  CKTOTAL 71 74 88  CKMB 3.5 3.5 3.4  CKMBINDEX -- -- --  TROPONINI <0.30 <0.30 <0.30   Micro Results: Recent Results (from the past 240 hour(s))  CULTURE, BLOOD (ROUTINE X 2)     Status: Normal (Preliminary result)   Collection Time   09/17/11  5:19 PM      Component Value Range Status Comment   Specimen Description BLOOD LEFT ARM   Final    Special Requests BOTTLES DRAWN AEROBIC AND ANAEROBIC 10CC   Final    Culture  Setup Time [id number]   Final    Culture     Final    Value:        BLOOD CULTURE RECEIVED NO GROWTH TO DATE CULTURE WILL BE HELD FOR 5 DAYS BEFORE ISSUING A FINAL NEGATIVE REPORT   Report Status PENDING   Incomplete   CULTURE, BLOOD (ROUTINE X 2)     Status: Normal (Preliminary result)   Collection Time   09/17/11  5:23 PM      Component Value Range Status Comment   Specimen Description BLOOD RIGHT ARM   Final    Special Requests BOTTLES DRAWN  AEROBIC ONLY 5CC   Final    Culture  Setup Time [id number]   Final    Culture     Final    Value:        BLOOD CULTURE RECEIVED NO GROWTH TO DATE CULTURE WILL BE HELD FOR 5 DAYS BEFORE ISSUING A FINAL NEGATIVE REPORT   Report Status PENDING   Incomplete   MRSA PCR SCREENING     Status: Normal   Collection Time   09/18/11  4:38 AM      Component Value Range Status Comment   MRSA by PCR NEGATIVE  NEGATIVE  Final     Studies/Results: All recent x-ray/radiology reports have been reviewed in detail.   Medications: I have reviewed the patient's complete medication list.  Assessment/Plan:  Acute on chronic diastolic heart failure EF preserved via echo this admit - aggressive diuresis as per Dr. Katrinka Blazing continues for signif diastolic CHF - pt remains volume overloaded on exam  Atrial fibrillation (onset ~ 1 month ago) W/ prior hx of aflutter chronically on amio - pt refuses anticoag after suffering a spontaneous  intraperitoneal hemorrhage - TSH is normal - see plan as per Dr. Katrinka Blazing  ? CAP Pt is not responding well to current tx - CXR is worrisome for an actual LUL infiltrate - will change to zosyn to cover aspiration, and add vanc to cover community acquired MRSA  Acute respiratory failure  Due to above + PNA - tx each issue and follow - BIPAP for support in attempts to avoid exhaustion of resp muscles  Hypokalemia Due to diuresis - goal is to keep > 4.0   Renal insufficiency Renal function is holding steady for now  Borderline UA/UTI Should be very well covered by there current abx dosing  transaminitis Likely related to hypoperfusion  Elevated d-Dimer Could clearly be due to acute pulm infection - will check le dopplers as contrasted CT would be high risk for signif renal failure   DNR/NCB  Lonia Blood, MD [facility] Office  [phone] Pager [phone]  On-Call/Text Page:      [email or web address]      password [email or web address]

## 2011-09-20 ENCOUNTER — Inpatient Hospital Stay (HOSPITAL_COMMUNITY): Payer: MEDICARE

## 2011-09-20 DIAGNOSIS — R531 Weakness: Secondary | ICD-10-CM | POA: Diagnosis present

## 2011-09-20 DIAGNOSIS — D696 Thrombocytopenia, unspecified: Secondary | ICD-10-CM

## 2011-09-20 DIAGNOSIS — E039 Hypothyroidism, unspecified: Secondary | ICD-10-CM | POA: Diagnosis present

## 2011-09-20 DIAGNOSIS — N179 Acute kidney failure, unspecified: Secondary | ICD-10-CM | POA: Diagnosis present

## 2011-09-20 DIAGNOSIS — J9600 Acute respiratory failure, unspecified whether with hypoxia or hypercapnia: Secondary | ICD-10-CM

## 2011-09-20 DIAGNOSIS — E876 Hypokalemia: Secondary | ICD-10-CM | POA: Diagnosis present

## 2011-09-20 DIAGNOSIS — N189 Chronic kidney disease, unspecified: Secondary | ICD-10-CM | POA: Diagnosis present

## 2011-09-20 DIAGNOSIS — I4891 Unspecified atrial fibrillation: Secondary | ICD-10-CM | POA: Diagnosis present

## 2011-09-20 DIAGNOSIS — R069 Unspecified abnormalities of breathing: Secondary | ICD-10-CM

## 2011-09-20 DIAGNOSIS — J189 Pneumonia, unspecified organism: Secondary | ICD-10-CM

## 2011-09-20 DIAGNOSIS — I5030 Unspecified diastolic (congestive) heart failure: Secondary | ICD-10-CM | POA: Diagnosis present

## 2011-09-20 DIAGNOSIS — J9601 Acute respiratory failure with hypoxia: Secondary | ICD-10-CM | POA: Diagnosis present

## 2011-09-20 DIAGNOSIS — R000 Tachycardia, unspecified: Secondary | ICD-10-CM

## 2011-09-20 DIAGNOSIS — R918 Other nonspecific abnormal finding of lung field: Secondary | ICD-10-CM | POA: Diagnosis present

## 2011-09-20 DIAGNOSIS — R740 Nonspecific elevation of levels of transaminase and lactic acid dehydrogenase [LDH]: Secondary | ICD-10-CM

## 2011-09-20 LAB — BASIC METABOLIC PANEL
BUN: 43 mg/dL — ABNORMAL HIGH (ref 6–23)
BUN: 52 mg/dL — ABNORMAL HIGH (ref 6–23)
Chloride: 95 mEq/L — ABNORMAL LOW (ref 96–112)
Chloride: 98 mEq/L (ref 96–112)
Creatinine, Ser: 2 mg/dL — ABNORMAL HIGH (ref 0.50–1.10)
GFR calc Af Amer: 24 mL/min — ABNORMAL LOW (ref >90)
GFR calc Af Amer: 23 mL/min — ABNORMAL LOW (ref >90)
Glucose, Bld: 121 mg/dL — ABNORMAL HIGH (ref 70–99)
Potassium: 2.8 mEq/L — ABNORMAL LOW (ref 3.5–5.1)
Potassium: 3.3 mEq/L — ABNORMAL LOW (ref 3.5–5.1)

## 2011-09-20 MED ORDER — POTASSIUM CHLORIDE 20 MEQ/15ML (10%) PO LIQD
40.0000 meq | Freq: Three times a day (TID) | ORAL | Status: DC
  Administered 2011-09-20: 40 meq via ORAL
  Filled 2011-09-20 (×3): qty 30

## 2011-09-20 MED ORDER — BOOST PLUS PO LIQD
237.0000 mL | Freq: Three times a day (TID) | ORAL | Status: DC
  Administered 2011-09-21 – 2011-09-30 (×26): 237 mL via ORAL
  Filled 2011-09-20 (×38): qty 237

## 2011-09-20 MED ORDER — METHYLPREDNISOLONE SODIUM SUCC 125 MG IJ SOLR
60.0000 mg | Freq: Three times a day (TID) | INTRAMUSCULAR | Status: DC
  Administered 2011-09-20 – 2011-09-24 (×13): 60 mg via INTRAVENOUS
  Filled 2011-09-20 (×6): qty 0.96
  Filled 2011-09-20: qty 2
  Filled 2011-09-20 (×5): qty 0.96
  Filled 2011-09-20: qty 2
  Filled 2011-09-20 (×2): qty 0.96

## 2011-09-20 MED ORDER — FUROSEMIDE 10 MG/ML IJ SOLN
80.0000 mg | Freq: Four times a day (QID) | INTRAMUSCULAR | Status: DC
  Administered 2011-09-20 – 2011-09-22 (×7): 80 mg via INTRAVENOUS
  Filled 2011-09-20 (×10): qty 8

## 2011-09-20 MED ORDER — SODIUM CHLORIDE 0.9 % IJ SOLN
10.0000 mL | Freq: Two times a day (BID) | INTRAMUSCULAR | Status: DC
  Administered 2011-09-20: 20 mL
  Administered 2011-09-23: 10 mL
  Filled 2011-09-20: qty 10

## 2011-09-20 MED ORDER — POTASSIUM CHLORIDE 10 MEQ/50ML IV SOLN
INTRAVENOUS | Status: AC
  Administered 2011-09-20: 10 meq
  Filled 2011-09-20: qty 150

## 2011-09-20 MED ORDER — FUROSEMIDE 10 MG/ML IJ SOLN
120.0000 mg | Freq: Two times a day (BID) | INTRAVENOUS | Status: DC
  Filled 2011-09-20: qty 12

## 2011-09-20 MED ORDER — METOLAZONE 2.5 MG PO TABS
2.5000 mg | ORAL_TABLET | Freq: Once | ORAL | Status: AC
  Administered 2011-09-20: 2.5 mg via ORAL
  Filled 2011-09-20: qty 1

## 2011-09-20 MED ORDER — POTASSIUM CHLORIDE CRYS ER 20 MEQ PO TBCR: 40.0000 meq | EXTENDED_RELEASE_TABLET | Freq: Once | ORAL | Status: DC

## 2011-09-20 MED ORDER — SODIUM CHLORIDE 0.9 % IJ SOLN
10.0000 mL | INTRAMUSCULAR | Status: DC | PRN
  Administered 2011-09-21 (×2): 10 mL
  Administered 2011-09-24 – 2011-09-25 (×2): 20 mL
  Administered 2011-09-26 – 2011-09-30 (×4): 10 mL
  Filled 2011-09-20 (×3): qty 20
  Filled 2011-09-20: qty 30
  Filled 2011-09-20: qty 20

## 2011-09-20 MED ORDER — POTASSIUM CHLORIDE 10 MEQ/100ML IV SOLN
10.0000 meq | INTRAVENOUS | Status: AC
  Administered 2011-09-20 (×2): 10 meq via INTRAVENOUS
  Filled 2011-09-20: qty 100

## 2011-09-20 MED ORDER — LORAZEPAM 2 MG/ML IJ SOLN
0.2500 mg | INTRAMUSCULAR | Status: AC | PRN
  Administered 2011-09-20: 0.26 mg via INTRAVENOUS
  Filled 2011-09-20: qty 1

## 2011-09-20 NOTE — Progress Notes (Signed)
[facility]  TEAM 1 - Stepdown/ICU TEAM  PCP:  Carollee Herter, MD, MD  Subjective: 78 y.o. female who presented with progressive dyspnea for 3 weeks associated with worsening peripheral edema.  She was started by Dr. Katrinka Blazing on Lasix and this was titrated up to 80 BID, but she still experienced no improvement in her symptoms.   She continues to feel poor. Appears exhausted.     Objective:  Intake/Output Summary (Last 24 hours) at 09/20/11 1733 Last data filed at 09/20/11 1602  Gross per 24 hour  Intake      0 ml  Output   1800 ml  Net  -1800 ml   Blood pressure 93/44, pulse 83, temperature 98.7 F (37.1 C), temperature source Oral, resp. rate 16, height 5\' 5"  (1.651 m), weight 63 kg (138 lb 14.2 oz), SpO2 97.00%.  Physical Exam: General: modest resp distress at rest - able to complete sentences, but has to pause for breaths intermittently Lungs: course crackles th/o wh/o wheeze Cardiovascular: distant - regular rate - no gallup or rub appreciably Abdomen: Nontender, nondistended, soft, bowel sounds positive, no rebound, no ascites, no appreciable mass Extremities: 2+ B le edema  Lab Results:  Basename 09/20/11 0500 09/19/11 0438 09/18/11 0146  NA 139 137 140  K 2.8* 2.8* 3.8  CL 95* 98 100  CO2 29 26 25   GLUCOSE 121* 104* 90  BUN 43* 34* 34*  CREATININE 2.00* 1.52* 1.54*  CALCIUM 8.8 8.5 8.7  MG 1.9 -- 2.0  PHOS -- -- --    Basename 09/18/11 0146  AST 44*  ALT 48*  ALKPHOS 89  BILITOT 0.3  PROT 6.2  ALBUMIN 2.8*    Basename 09/19/11 0438 09/18/11 0430 09/17/11 2036  WBC 15.5* 14.5* 13.0*  NEUTROABS -- 12.2* --  HGB 11.7* 12.1 11.6*  HCT 36.3 36.9 35.1*  MCV 93.1 92.5 91.9  PLT 337 342 343    Basename 09/18/11 0954 09/18/11 0146 09/17/11 2035  CKTOTAL 71 74 88  CKMB 3.5 3.5 3.4  CKMBINDEX -- -- --  TROPONINI <0.30 <0.30 <0.30   Micro Results: Recent Results (from the past 240 hour(s))  CULTURE, BLOOD (ROUTINE X 2)     Status:  Normal (Preliminary result)   Collection Time   09/17/11  5:19 PM      Component Value Range Status Comment   Specimen Description BLOOD LEFT ARM   Final    Special Requests BOTTLES DRAWN AEROBIC AND ANAEROBIC 10CC   Final    Culture  Setup Time [id number]   Final    Culture     Final    Value:        BLOOD CULTURE RECEIVED NO GROWTH TO DATE CULTURE WILL BE HELD FOR 5 DAYS BEFORE ISSUING A FINAL NEGATIVE REPORT   Report Status PENDING   Incomplete   CULTURE, BLOOD (ROUTINE X 2)     Status: Normal (Preliminary result)   Collection Time   09/17/11  5:23 PM      Component Value Range Status Comment   Specimen Description BLOOD RIGHT ARM   Final    Special Requests BOTTLES DRAWN AEROBIC ONLY 5CC   Final    Culture  Setup Time [id number]   Final    Culture     Final    Value:        BLOOD CULTURE RECEIVED NO GROWTH TO DATE CULTURE WILL BE HELD FOR 5 DAYS BEFORE ISSUING A FINAL NEGATIVE REPORT   Report Status  PENDING   Incomplete   MRSA PCR SCREENING     Status: Normal   Collection Time   09/18/11  4:38 AM      Component Value Range Status Comment   MRSA by PCR NEGATIVE  NEGATIVE  Final     Studies/Results: All recent x-ray/radiology reports have been reviewed in detail.   Medications: I have reviewed the patient's complete medication list.  Assessment/Plan:   Acute respiratory failure  Due to above + PNA - tx each issue and follow - BIPAP for support in attempts to avoid exhaustion of resp muscles Appreciate pulm f/u- started on steroids for pneumonitis- amio toxicity?  Acute on chronic diastolic heart failure EF preserved via echo this admit - aggressive diuresis as per Dr. Katrinka Blazing- for signif diastolic CHF - asked for PICC- CVP 17- cont high dose lasix  Atrial fibrillation (onset ~ 1 month ago) W/ prior hx of aflutter chronically on amio - pt refuses anticoag after suffering a spontaneous intraperitoneal hemorrhage - TSH is normal - see plan as per Dr. Katrinka Blazing  ? CAP Pt is  not responding well to current tx - CXR initially revealed LUL infiltrate and now reveals diffuse infiltrates- will change to zosyn to cover aspiration, and added vanc to cover community acquired MRSA  Hypokalemia Due to diuresis - goal is to keep > 4.0 - replaced, repeat level  Renal insufficiency Worsening Cr- follow closely.   Borderline UA/UTI Should be very well covered by there current abx dosing  transaminitis Likely related to hypoperfusion  Elevated d-Dimer Could clearly be due to acute pulm infection - will check le dopplers as contrasted CT would be high risk for signif renal failure   DNR/NCB  Calvert Cantor, MD [facility] Office  [phone] Pager [phone]  On-Call/Text Page:      [email or web address]      password [facility]

## 2011-09-20 NOTE — Progress Notes (Signed)
Patient Name: MERIDETH BOSQUE Date of Encounter: 09/20/2011    SUBJECTIVE:Still on BIPAP with O2 sats 90%.  TELEMETRY:  A fib with V-pacing: Filed Vitals:   09/20/11 0000 09/20/11 0400 09/20/11 0405 09/20/11 0450  BP: 118/66 106/57 106/57   Pulse:  86 83   Temp: 98 F (36.7 C) 98.3 F (36.8 C)    TempSrc: Axillary Axillary    Resp:  33 28   Height:      Weight:    63 kg (138 lb 14.2 oz)  SpO2:  87%      Intake/Output Summary (Last 24 hours) at 09/20/11 0822 Last data filed at 09/20/11 0400  Gross per 24 hour  Intake   1030 ml  Output   1650 ml  Net   -620 ml    LABS: Basic Metabolic Panel:  Basename 09/20/11 0500 09/19/11 0438 09/18/11 0146  NA 139 137 --  K 2.8* 2.8* --  CL 95* 98 --  CO2 29 26 --  GLUCOSE 121* 104* --  BUN 43* 34* --  CREATININE 2.00* 1.52* --  CALCIUM 8.8 8.5 --  MG -- -- 2.0  PHOS -- -- --   CBC:  Basename 09/19/11 0438 09/18/11 0430 09/17/11 1657  WBC 15.5* 14.5* --  NEUTROABS -- 12.2* 11.8*  HGB 11.7* 12.1 --  HCT 36.3 36.9 --  MCV 93.1 92.5 --  PLT 337 342 --   Cardiac Enzymes:  Basename 09/18/11 0954 09/18/11 0146 09/17/11 2035  CKTOTAL 71 74 88  CKMB 3.5 3.5 3.4  CKMBINDEX -- -- --  TROPONINI <0.30 <0.30 <0.30   BNP: 5400  Radiology/Studies:  No new  Physical Exam: Blood pressure 106/57, pulse 83, temperature 98.3 F (36.8 C), temperature source Axillary, resp. rate 28, height 5\' 5"  (1.651 m), weight 63 kg (138 lb 14.2 oz), SpO2 87.00%. Weight change: -2.3 kg (-5 lb 1.1 oz)   Positive rhonchi  Rhythm is regular  Extremities reveal no edema.  ASSESSMENT:  1. Respiratory failure, multifactorial due to CHF, PNA?, and possible amiodarone toxicity. As for amiodarone, she was taking 100 daily for past year and within past month increased to 200 mg daily after recurrent atrial fib identified. As for CHF, is negative 2400 since admission. Therefore, infection is a concern.  2. Acute on chronic diastolic heart failure,  with diuresis not effecting improvement.  3. Acute kidney injury due to diuresis  4. Severe hypokalemia   Plan:  1. We need to get a pulmonary medicine consult to see if there is anything else to do. 2. Consider lasix drip. 3. Do not intubate 4. Replete K   Selinda Eon 09/20/2011, 8:22 AM

## 2011-09-20 NOTE — Progress Notes (Signed)
Utilization review completed.  

## 2011-09-20 NOTE — Consult Note (Signed)
Name: DESERI LOSS MRN: [id number] DOB: 1919/08/13    LOS: 3  PULMONARY / CRITICAL CARE MEDICINE  Reason for Consult: Hypoxic Respiratory Failure Consulting MD: Mendel Ryder  Brief history 78 year old female  w/ known h/o AF, diastolic dysfxn and CRI. Presented on 5/11:  CXR demonstrated bilateral upper lobe airspace disease. She was treated w/ aggressive diuresis, supplemental oxygen and empiric antibiotic treatment for CAP. Amiodarone was stopped on 5/13. Pulmonary was asked to see on 5/14 as in spite of aggressive diuresis and above treatment.   HPI:  78 year old female w/ known h/o AF, diastolic dysfxn and CRI. F/b Dr Katrinka Blazing in out-pt cardiology setting. Presented on 5/11 with >1 week h/o exertional dyspnea and LE edema that seemed to temporally relate to onset of AF in out-pt setting which was noted ~ 4 weeks prior. She did not improve w/ out-pt diuresis. On presentation CXR demonstrated bilateral upper lobe airspace disease. She denied fever, chills, sick exposures. Did endorse cough but not able to verbalize sputum color. She was admitted for further in-patient treatment which included: aggressive diuresis, supplemental oxygen and empiric antibiotic treatment for CAP. Amiodarone was stopped on 5/13. Pulmonary was asked to see on 5/14 as in spite of aggressive diuresis and above treatment, with the primary question could amiodarone toxicity be a contributing factor.  Past Medical History  Diagnosis Date  . Hypertension   . Arthritis     RT HIP  . Osteoporosis   . Renal insufficiency   . Atrial fib/flutter, transient   . Thyroid disease    Past Surgical History  Procedure Date  . Joint replacement      RT HIP REPLACEMENT  . Knee arthroscopy     right  . Pacemaker insertion    Prior to Admission medications   Medication Sig Start Date End Date Taking? Authorizing Provider  amiodarone (PACERONE) 200 MG tablet Take 200 mg by mouth daily.   Yes Historical Provider, MD  amLODipine  (NORVASC) 5 MG tablet Take 5 mg by mouth daily.     Yes Historical Provider, MD  aspirin 81 MG tablet Take 81 mg by mouth daily.     Yes Historical Provider, MD  atenolol (TENORMIN) 25 MG tablet Take 25 mg by mouth daily.   Yes Historical Provider, MD  Cholecalciferol (VITAMIN D) 2000 UNITS CAPS Take 2,000 Units by mouth.    Yes Historical Provider, MD  citalopram (CELEXA) 10 MG tablet Take 1 tablet (10 mg total) by mouth daily. 07/15/11 07/14/12 Yes Ronnald Nian, MD  furosemide (LASIX) 80 MG tablet Take 80 mg by mouth 2 (two) times daily.   Yes Historical Provider, MD  levothyroxine (SYNTHROID) 88 MCG tablet Take 1 tablet (88 mcg total) by mouth daily. 08/22/11 08/21/12 Yes Ronnald Nian, MD  Multiple Vitamin (MULITIVITAMIN WITH MINERALS) TABS Take 1 tablet by mouth daily.   Yes Historical Provider, MD   Allergies Allergies  Allergen Reactions  . Amoxicillin Hives  . Ciprofloxacin Hives  . Polocaine (Mepivacaine Hcl) Hives  . Procaine Hives  . Promethazine Hcl Hives  . Quinidine   . Septra (Sulfamethoxazole W/Trimethoprim (Co-Trimoxazole)) Other (See Comments)    Dizziness and tremor  . Tiazac (Diltiazem Hcl) Hives  . Vicodin (Hydrocodone-Acetaminophen) Hives  . Warfarin Sodium     GI bleed    Family History Family History  Problem Relation Age of Onset  . Hypertension Mother   . Heart disease Mother     Heart failure (vague history)  Social History  reports that she has never smoked. She has never used smokeless tobacco. She reports that she does not drink alcohol or use illicit drugs.  Review Of Systems:  Unable to provide  Current Status: 78 year old female, currently w/ paradoxical respiratory efforts.   Vital Signs: Temp:  [97.5 F (36.4 C)-98.3 F (36.8 C)] 98.3 F (36.8 C) (05/14 0400) Pulse Rate:  [74-90] 82  (05/14 0856) Resp:  [17-33] 24  (05/14 0856) BP: (104-127)/(57-70) 104/57 mmHg (05/14 0856) SpO2:  [87 %-95 %] 95 % (05/14 0856) FiO2 (%):  [100 %] 100 %  (05/13 1600) Weight:  [63 kg (138 lb 14.2 oz)] 63 kg (138 lb 14.2 oz) (05/14 0450)  Physical Examination: General:  NIMV on face, no acute distress Neuro:  synchronous, nonfocal HEENT:  PERRL, pink conjunctivae, moist membranes Neck:  Supple, no JVD   Cardiovascular:  RRR, no M/R/G Lungs:  Bilateral diminished air entry, crackles dry apical, wet bases inspiration, no wheezing Abdomen:  Soft, nontender, nondistended, bowel sounds present Musculoskeletal:  Moves all extremities, no pedal edema Skin:  No rash  Principal Problem:  *CAP (community acquired pneumonia) Active Problems:  CHF (congestive heart failure)  Hypoxia  Abnormal LFTs  Acute respiratory failure with hypoxia  Atrial fibrillation  Diastolic heart failure  Pulmonary infiltrate  Hypothyroid  Hypokalemia  Weakness generalized  Acute on chronic renal failure   ASSESSMENT AND PLAN  PULMONARY No results found for this basename: PHART:5,PCO2:5,PCO2ART:5,PO2ART:5,HCO3:5,O2SAT:5 in the last 168 hours  CXR: Bilateral L>R upper lobe predominant pulmonary infiltrates w/ small left effusion.    A: Acute Respiratory Failure in the setting of Bilateral upper lobe predominant pulmonary infiltrates. Primary Diff dx: edema (now diuresed to the point her creatinine has bumped), CAP (currently on broad spec abx), pneumonitis (possibly amiodarone induced), aspiration w/ ALI (doubt) or some mix of any of the above.  P:   -cont to cycle BIPAP 4 hours on 4 hours off to rest muscle respiration. She is full DNR and we will respect these wishes.  -Diurese as BUN/Creatinine will allow-->currently creatinine has increased so agree with stopping lasix today -agree CVP would be helpful, PICC line being ordered by primary team.  -cont empiric antibiotics, atypical coverage azithro -agree w/ stopping amiodarone.  -will go ahead and try steroid trial. Little to lose as her risk of dying from this is high.  CT chest if stable for this resp  wise, unable now  CARDIOVASCULAR  Lab 09/20/11 0500 09/18/11 0954 09/18/11 0146 09/17/11 2035 09/17/11 1527  TROPONINI -- <0.30 <0.30 <0.30 --  LATICACIDVEN -- -- -- -- --  PROBNP 5485.0* -- -- -- 3331.0*   ECG:  paced Lines:  ECHO 5/12: There was mild focal basal hypertrophy of the septum. Systolic function was normal. The estimated ejection fraction was in the range of 55% to 60%.  A: Atrial Fib.  P:  -rate control per Dr Katrinka Blazing -not a anticoagulation candidate d/t prior life threatening bleed.   A: diastolic heart failure P:  -bp control -assess CVP  RENAL  Lab 09/20/11 0500 09/19/11 0438 09/18/11 0146 09/17/11 2036 09/17/11 1527  NA 139 137 140 -- 137  K 2.8* 2.8* -- -- --  CL 95* 98 100 -- 99  CO2 29 26 25  -- 22  BUN 43* 34* 34* -- 37*  CREATININE 2.00* 1.52* 1.54* 1.50* 1.53*  CALCIUM 8.8 8.5 8.7 -- 9.0  MG -- -- 2.0 -- --  PHOS -- -- -- -- --  Intake/Output      05/13 0701 - 05/14 0700 05/14 0701 - 05/15 0700   P.O. 480    Other 550    IV Piggyback     Total Intake(mL/kg) 1030 (16.3)    Urine (mL/kg/hr) 2000 (1.3)    Total Output 2000    Net -970         Stool Occurrence 1 x     Foley:  5/11  A: Acute on Chronic Renal Failure. Bl scr 1.5 range. Now elevated after aggressive diuresis  P:   -agree w/ holding diuresis today -f/u chemistry  A: hypokalemia in setting of diuresis P: -replace and recheck per #1 service.   GASTROINTESTINAL  Lab 09/18/11 0146 09/17/11 1527  AST 44* 55*  ALT 48* 50*  ALKPHOS 89 88  BILITOT 0.3 0.4  PROT 6.2 6.4  ALBUMIN 2.8* 3.0*   A:  Mild elevated LFTs: improved  P:   -per #1 service.   HEMATOLOGIC  Lab 09/19/11 0438 09/18/11 0430 09/17/11 2036 09/17/11 1657  HGB 11.7* 12.1 11.6* 12.1  HCT 36.3 36.9 35.1* 37.3  PLT 337 342 343 357  INR -- -- -- --  APTT -- -- -- --   A:  Anemia: no evidence of bleeding.  P:  -monitor, no role for xfusion.   INFECTIOUS  Lab 09/19/11 0438 09/18/11 0430 09/17/11  2036 09/17/11 1657  WBC 15.5* 14.5* 13.0* 14.1*  PROCALCITON -- -- -- --   Cultures: bcx2 5/11>>> MRSA PCR 5/11: neg Urine strep and legionella: negative  Antibiotics: azithro  (CAP) 5/11>>> Rocephin (CAP) 5/11>>>5/12 Cefepime (CAP) 5/12>>>5/13 Zosyn (progressive infiltrates)5/13>>> vanc(progressive infiltrates) 5/13>>>  A:  Possible CAP: abx widened due to clinical decline on 5/13 P:   -cont broad spec empiric coverage.   ENDOCRINE No results found for this basename: GLUCAP:5 in the last 168 hours A:  No current issues however but will be starting on IV solumedrol  P:   Begin cbg checks  A: hypothyroidism P:  -cont synthroid  NEUROLOGIC  A:  weakness P:   -supportive care. May need int fent  BEST PRACTICE / DISPOSITION - Level of Care:  SDU - Primary Service:  [facility] - Consultants:  pulm and cards - Code Status:  Full dnr - Diet:  npo - DVT Px:  lmwh - GI Px: ppi - Skin Integrity:  intact - Social: none  BABCOCK,PETE,  09/20/2011, 9:08 AM   I have fully examined this patient and agree with above findings.    And edited in full  DNI noted. Unlikely amio contribution however no option ETT and time, consider steroids empiric, continue ABX, caution diuresis with current crt trend. NIMV q4h on q4h off, NPO.  Ccm 35 min   Mcarthur Rossetti. Tyson Alias, MD, FACP Pgr: [phone] [facility]

## 2011-09-20 NOTE — Progress Notes (Signed)
*  Preliminary Results* Bilateral lower extremity venous duplex completed. Left lower extremity is positive for deep vein thrombosis in the distal femoral vein and posterior tibial veins. No evidence of deep vein thrombosis of the right lower extremity. Incidental finding: large complex Baker's cyst of the left popliteal fossa.  09/20/2011 2:06 PM  Elpidio Galea, RDMS,RDCS

## 2011-09-20 NOTE — Progress Notes (Signed)
Pt underwent PICC placement today. Four runs of IVPB K were admin; one via PIV, remaining three via new approved PICC. Because of time lapse for PICC placement, K ws expired on MAR. Rx stated a documented note would replace the inability to chart against an expired order. Pt encouraged to wear BiPAP; Pt continues to desat when on non-re breather during med admin & fluid intake.

## 2011-09-21 DIAGNOSIS — R000 Tachycardia, unspecified: Secondary | ICD-10-CM

## 2011-09-21 DIAGNOSIS — J9600 Acute respiratory failure, unspecified whether with hypoxia or hypercapnia: Secondary | ICD-10-CM

## 2011-09-21 DIAGNOSIS — R740 Nonspecific elevation of levels of transaminase and lactic acid dehydrogenase [LDH]: Secondary | ICD-10-CM

## 2011-09-21 DIAGNOSIS — D696 Thrombocytopenia, unspecified: Secondary | ICD-10-CM

## 2011-09-21 LAB — BASIC METABOLIC PANEL
BUN: 63 mg/dL — ABNORMAL HIGH (ref 6–23)
CO2: 34 mEq/L — ABNORMAL HIGH (ref 19–32)
Chloride: 97 mEq/L (ref 96–112)
Chloride: 95 mEq/L — ABNORMAL LOW (ref 96–112)
GFR calc Af Amer: 23 mL/min — ABNORMAL LOW (ref >90)
GFR calc non Af Amer: 20 mL/min — ABNORMAL LOW (ref >90)
Glucose, Bld: 145 mg/dL — ABNORMAL HIGH (ref 70–99)
Glucose, Bld: 230 mg/dL — ABNORMAL HIGH (ref 70–99)
Potassium: 2.6 mEq/L — CL (ref 3.5–5.1)
Potassium: 3 mEq/L — ABNORMAL LOW (ref 3.5–5.1)
Sodium: 143 mEq/L (ref 135–145)

## 2011-09-21 LAB — CBC
HCT: 33.5 % — ABNORMAL LOW (ref 36.0–46.0)
Hemoglobin: 11.2 g/dL — ABNORMAL LOW (ref 12.0–15.0)
WBC: 14 10*3/uL — ABNORMAL HIGH (ref 4.0–10.5)

## 2011-09-21 MED ORDER — POTASSIUM CHLORIDE CRYS ER 20 MEQ PO TBCR
40.0000 meq | EXTENDED_RELEASE_TABLET | Freq: Every day | ORAL | Status: DC
  Administered 2011-09-21: 40 meq via ORAL
  Filled 2011-09-21: qty 2

## 2011-09-21 MED ORDER — POTASSIUM CHLORIDE 10 MEQ/100ML IV SOLN
10.0000 meq | INTRAVENOUS | Status: AC
  Administered 2011-09-21 (×3): 10 meq via INTRAVENOUS
  Filled 2011-09-21: qty 300
  Filled 2011-09-21: qty 100

## 2011-09-21 MED ORDER — ATENOLOL 25 MG PO TABS
25.0000 mg | ORAL_TABLET | Freq: Two times a day (BID) | ORAL | Status: DC
  Administered 2011-09-21 – 2011-09-30 (×18): 25 mg via ORAL
  Filled 2011-09-21 (×19): qty 1

## 2011-09-21 MED ORDER — VANCOMYCIN HCL IN DEXTROSE 1-5 GM/200ML-% IV SOLN
1000.0000 mg | INTRAVENOUS | Status: DC
  Administered 2011-09-21 – 2011-09-25 (×3): 1000 mg via INTRAVENOUS
  Filled 2011-09-21 (×3): qty 200

## 2011-09-21 MED ORDER — POTASSIUM CHLORIDE CRYS ER 20 MEQ PO TBCR
40.0000 meq | EXTENDED_RELEASE_TABLET | Freq: Two times a day (BID) | ORAL | Status: DC
  Administered 2011-09-21: 40 meq via ORAL
  Filled 2011-09-21 (×3): qty 2

## 2011-09-21 NOTE — Progress Notes (Signed)
CRITICAL VALUE ALERT  Critical value received:  K - 2.6  Date of notification:  09/21/11  Time of notification:  0500  Critical value read back: yes  Nurse who received alert:  Elisha Headland RN   MD notified (1st page):  Zachary George NP   Time of first page:  0500  Responding MD:  Zachary George NP   Time MD responded:  0500  Intervention: 3 Runs of K   Elisha Headland RN

## 2011-09-21 NOTE — Care Management Note (Signed)
    Page 1 of 1   09/21/2011     2:58:34 PM   CARE MANAGEMENT NOTE 09/21/2011  Patient:  [name], [name]   Account Number:  [id number]  Date Initiated:  09/21/2011  Documentation initiated by:  Donn Pierini  Subjective/Objective Assessment:   Pt admitted with PNA- requiring 100% NRB     Action/Plan:   PTA pt lived at home with spouse, independent with ADLs,   Anticipated DC Date:  09/26/2011   Anticipated DC Plan:  HOME W HOME HEALTH SERVICES      DC Planning Services  CM consult      Choice offered to / List presented to:             Status of service:  In process, will continue to follow Medicare Important Message given?   (If response is "NO", the following Medicare IM given date fields will be blank) Date Medicare IM given:   Date Additional Medicare IM given:    Discharge Disposition:    Per UR Regulation:  Reviewed for med. necessity/level of care/duration of stay  If discussed at Long Length of Stay Meetings, dates discussed:    Comments:  PCP- [place]  09/21/11- 1445- Donn Pierini RN, BSN [phone] Spoke with pt and daughters at bedside- per conversation pt lives at home with spouse- who is on Hospice Care with [facility]. Husband has an Engineer, production and hospice RN that comes through [facility]. Per daughters there are sitters at home via private pay. Pt usually uses a 4-prong cane.  Other DME in the home is mainly for the husband and includes- BSC, walker, w/c, shower bench, hospital bed, and home O2 for the spouse. Pt states that she does have medication benefits and uses mail order for her prescriptions. She will have transportation home. Pt may end up needing home O2 herself, and could use a PT eval when able to tolerate activity. MD please order PT eval when appropriate- Thanks-   NCM to follow for d/c planning.

## 2011-09-21 NOTE — Progress Notes (Signed)
ANTIBIOTIC CONSULT NOTE - INITIAL  Pharmacy Consult for Vancomycin and Zosyn Indication: empiric tx of ? community acquired MRSA PNA   Allergies  Allergen Reactions  . Amoxicillin Hives  . Ciprofloxacin Hives  . Polocaine (Mepivacaine Hcl) Hives  . Procaine Hives  . Promethazine Hcl Hives  . Quinidine   . Septra (Sulfamethoxazole W/Trimethoprim (Co-Trimoxazole)) Other (See Comments)    Dizziness and tremor  . Tiazac (Diltiazem Hcl) Hives  . Vicodin (Hydrocodone-Acetaminophen) Hives  . Warfarin Sodium     GI bleed    Patient Measurements: Height: 5\' 5"  (165.1 cm) Weight: 136 lb 14.5 oz (62.1 kg) IBW/kg (Calculated) : 57    Vital Signs: Temp: 97.6 F (36.4 C) (05/15 1123) Temp src: Oral (05/15 1123) BP: 97/52 mmHg (05/15 1123) Pulse Rate: 92  (05/15 1123) Intake/Output from previous day: 05/14 0701 - 05/15 0700 In: 716 [IV Piggyback:716] Out: 3025 [Urine:3025] Intake/Output from this shift: Total I/O In: 181 [P.O.:120; I.V.:3; IV Piggyback:58] Out: 950 [Urine:950]  Labs:  Basename 09/21/11 0354 09/20/11 1800 09/20/11 0500 09/19/11 0438  WBC 14.0* -- -- 15.5*  HGB 11.2* -- -- 11.7*  PLT 317 -- -- 337  LABCREA -- -- -- --  CREATININE 2.12* 2.08* 2.00* --   Estimated Creatinine Clearance: 16.2 ml/min (by C-G formula based on Cr of 2.12). No results found for this basename: VANCOTROUGH:2,VANCOPEAK:2,VANCORANDOM:2,GENTTROUGH:2,GENTPEAK:2,GENTRANDOM:2,TOBRATROUGH:2,TOBRAPEAK:2,TOBRARND:2,AMIKACINPEAK:2,AMIKACINTROU:2,AMIKACIN:2, in the last 72 hours   Microbiology: Recent Results (from the past 720 hour(s))  CULTURE, BLOOD (ROUTINE X 2)     Status: Normal (Preliminary result)   Collection Time   09/17/11  5:19 PM      Component Value Range Status Comment   Specimen Description BLOOD LEFT ARM   Final    Special Requests BOTTLES DRAWN AEROBIC AND ANAEROBIC 10CC   Final    Culture  Setup Time [id number]   Final    Culture     Final    Value:        BLOOD CULTURE  RECEIVED NO GROWTH TO DATE CULTURE WILL BE HELD FOR 5 DAYS BEFORE ISSUING A FINAL NEGATIVE REPORT   Report Status PENDING   Incomplete   CULTURE, BLOOD (ROUTINE X 2)     Status: Normal (Preliminary result)   Collection Time   09/17/11  5:23 PM      Component Value Range Status Comment   Specimen Description BLOOD RIGHT ARM   Final    Special Requests BOTTLES DRAWN AEROBIC ONLY 5CC   Final    Culture  Setup Time [id number]   Final    Culture     Final    Value:        BLOOD CULTURE RECEIVED NO GROWTH TO DATE CULTURE WILL BE HELD FOR 5 DAYS BEFORE ISSUING A FINAL NEGATIVE REPORT   Report Status PENDING   Incomplete   MRSA PCR SCREENING     Status: Normal   Collection Time   09/18/11  4:38 AM      Component Value Range Status Comment   MRSA by PCR NEGATIVE  NEGATIVE  Final     Medical History: Past Medical History  Diagnosis Date  . Hypertension   . Arthritis     RT HIP  . Osteoporosis   . Renal insufficiency   . Atrial fib/flutter, transient   . Thyroid disease     Medications:  Scheduled:     . aspirin  81 mg Oral Daily  . atenolol  25 mg Oral BID  . azithromycin  500  mg Intravenous Q24H  . citalopram  10 mg Oral Daily  . enoxaparin  30 mg Subcutaneous Q24H  . furosemide  80 mg Intravenous Q6H  . lactose free nutrition  237 mL Oral TID WC  . levothyroxine  88 mcg Oral Q0600  . methylPREDNISolone (SOLU-MEDROL) injection  60 mg Intravenous Q8H  . metolazone  2.5 mg Oral Once  . piperacillin-tazobactam (ZOSYN)  IV  2.25 g Intravenous Q6H  . polyethylene glycol  17 g Oral Daily  . potassium chloride  10 mEq Intravenous Q1 Hr x 4  . potassium chloride  10 mEq Intravenous Q1 Hr x 3  . potassium chloride      . potassium chloride  40 mEq Oral Daily  . senna  2 tablet Oral QHS  . sodium chloride  10-40 mL Intracatheter Q12H  . sodium chloride  3 mL Intravenous Q12H  . vancomycin  1,000 mg Intravenous Q48H  . DISCONTD: potassium chloride  40 mEq Oral TID  . DISCONTD:  vancomycin  750 mg Intravenous Q24H   Assessment: 78 y.o. Female with possible CAP and renal insufficiency.  Changed antibiotics from Cefepime to Vancomycin and Zosyn.  Continuing Azithromycin.  SrCr has worsened so will adjust vancomycin dose.  Goal of Therapy:  Vancomycin trough level 15-20 mcg/ml  Plan:  Cont zosyn 2.25g IV q6h Change vancomycin 1000 mg IV q48 hours Monitor renal function, cultures. Check vancomycin steady state trough per protocol.   Talbert Cage, PharmD Clinical Pharmacist 09/21/2011,1:16 PM

## 2011-09-21 NOTE — Progress Notes (Signed)
Patient Name: [name] Date of Encounter: 09/21/2011    SUBJECTIVE: Perhaps feels a bit better. O2 requirement is decreasing.  TELEMETRY:  A fib with v pacing: Filed Vitals:   09/21/11 0121 09/21/11 0425 09/21/11 0456 09/21/11 0503  BP:  102/55  102/55  Pulse: 83 82  83  Temp:  97.1 F (36.2 C)    TempSrc:  Axillary    Resp: 13 17  15   Height:      Weight:  62.1 kg (136 lb 14.5 oz) 62.1 kg (136 lb 14.5 oz)   SpO2: 94% 99%  96%    Intake/Output Summary (Last 24 hours) at 09/21/11 0727 Last data filed at 09/21/11 0537  Gross per 24 hour  Intake    516 ml  Output   3025 ml  Net  -2509 ml    LABS: Basic Metabolic Panel:  Basename 09/21/11 0354 09/20/11 1800 09/20/11 0500  NA 143 142 --  K 2.6* 3.3* --  CL 97 98 --  CO2 33* 33* --  GLUCOSE 145* 189* --  BUN 55* 52* --  CREATININE 2.12* 2.08* --  CALCIUM 8.9 8.8 --  MG -- -- 1.9  PHOS -- -- --   CBC:  Basename 09/21/11 0354 09/19/11 0438  WBC 14.0* 15.5*  NEUTROABS -- --  HGB 11.2* 11.7*  HCT 33.5* 36.3  MCV 90.3 93.1  PLT 317 337   Cardiac Enzymes:  Basename 09/18/11 0954  CKTOTAL 71  CKMB 3.5  CKMBINDEX --  TROPONINI <0.30   I/O: negative 4900 since admission Radiology/Studies:   PORTABLE CHEST - 1 VIEW  Comparison: 09/18/2011  Findings: Right upper extremity PICC placed with its tip at the  cavoatrial junction. Diffuse airspace disease is worse.  Cardiomegaly. Left subclavian dual lead pacemaker unchanged. No  pneumothorax.  IMPRESSION:  Right PICC tip at the cavoatrial junction.  Bilateral airspace disease worse.  Original Report Authenticated By: Donavan Burnet, M.D  Physical Exam: Blood pressure 102/55, pulse 83, temperature 97.1 F (36.2 C), temperature source Axillary, resp. rate 15, height 5\' 5"  (1.651 m), weight 62.1 kg (136 lb 14.5 oz), SpO2 96.00%. Weight change: -0.9 kg (-1 lb 15.8 oz)   Diffuse rales anteriorly, sound more fibrosis/cellophane like than wet.  Irregular  rhythm   ASSESSMENT:  1. Acute on chronic diastolic heart failure, improving with diuresis  2. Acute kidney injury due to diuresis and low output  3. Acute respiratory failure, multifactorial.   Plan:  1. Continue aggressive diuresis for at least 12-24 hours more 2. Replete potassium  Signed, Lesleigh Noe 09/21/2011, 7:27 AM

## 2011-09-21 NOTE — Progress Notes (Signed)
[facility] TEAM 1 - Stepdown/ICU TEAM  PCP:  Carollee Herter, MD, MD  Subjective: 78 y.o. female who presented with progressive dyspnea for 3 weeks associated with worsening peripheral edema.  She was started by Dr. Katrinka Blazing on Lasix and this was titrated up to 80 BID, but she still experienced no improvement in her symptoms.   The pt appears to be somewhat more comfortable today.  She continues to report feeling "very SOB" and does not feel that she has improved.  She denies cp, n/v, abdom pain, or f/c.  Objective:  Intake/Output Summary (Last 24 hours) at 09/21/11 1135 Last data filed at 09/21/11 1100  Gross per 24 hour  Intake    839 ml  Output   3325 ml  Net  -2486 ml   Blood pressure 97/52, pulse 92, temperature 97.6 F (36.4 C), temperature source Oral, resp. rate 21, height 5\' 5"  (1.651 m), weight 62.1 kg (136 lb 14.5 oz), SpO2 93.00%.  Physical Exam: General: modest resp distress at rest - able to complete sentences, but has to pause for breaths intermittently - requiring NRB for O2 delivery Lungs: course crackles th/o wh/o wheeze Cardiovascular: distant - regular rate - no gallup or rub appreciable Abdomen: Nontender, nondistended, soft, bowel sounds positive, no rebound, no ascites, no appreciable mass Extremities: trace B le edema  Lab Results:  Basename 09/21/11 0354 09/20/11 1800 09/20/11 0500  NA 143 142 139  K 2.6* 3.3* 2.8*  CL 97 98 95*  CO2 33* 33* 29  GLUCOSE 145* 189* 121*  BUN 55* 52* 43*  CREATININE 2.12* 2.08* 2.00*  CALCIUM 8.9 8.8 8.8  MG -- -- 1.9  PHOS -- -- --   Basename 09/21/11 0354 09/19/11 0438  WBC 14.0* 15.5*  NEUTROABS -- --  HGB 11.2* 11.7*  HCT 33.5* 36.3  MCV 90.3 93.1  PLT 317 337   Studies/Results: All recent x-ray/radiology reports have been reviewed in detail.   Medications: I have reviewed the patient's complete medication list.  Assessment/Plan:  Acute respiratory failure  Due to CHF/afib +  PNA - tx each issue and follow - BIPAP for support in attempts to avoid exhaustion of resp muscles - appreciate pulm consult - on steroids for pneumonitis/?amio toxicity  Acute on chronic diastolic heart failure EF preserved via echo this admit - aggressive diuresis as per Dr. Katrinka Blazing for signif diastolic CHF - CVP elevated - cont high dose lasix  Atrial fibrillation (onset ~ 1 month ago) W/ prior hx of aflutter chronically on amio - pt refuses anticoag after suffering a spontaneous intraperitoneal hemorrhage - TSH is normal - see plan as per Dr. Katrinka Blazing  ? CAP Pt is not responding well to current tx - CXR initially revealed LUL infiltrate and now reveals diffuse infiltrates- changed to zosyn to cover aspiration, and added vanc to cover community acquired MRSA - f/u CXR in AM  Hypokalemia Due to diuresis - goal is to keep > 4.0 - cont to replace  Renal insufficiency Worsening Cr with diuresis, but little option but to continue for now given resp difficulty  Borderline UA/UTI Should be very well covered by there current abx dosing  transaminitis Likely related to hypoperfusion  Elevated d-Dimer Could clearly be due to acute pulm infection - awaiting LE dopplers as contrasted CT would be high risk for signif renal failure   DNR/NCB  Lonia Blood, MD [facility] Office  [phone] Pager [phone]  On-Call/Text Page:      [email or web address]  password [facility]

## 2011-09-22 ENCOUNTER — Inpatient Hospital Stay (HOSPITAL_COMMUNITY): Payer: MEDICARE

## 2011-09-22 DIAGNOSIS — D696 Thrombocytopenia, unspecified: Secondary | ICD-10-CM

## 2011-09-22 DIAGNOSIS — R740 Nonspecific elevation of levels of transaminase and lactic acid dehydrogenase [LDH]: Secondary | ICD-10-CM

## 2011-09-22 DIAGNOSIS — R918 Other nonspecific abnormal finding of lung field: Secondary | ICD-10-CM

## 2011-09-22 DIAGNOSIS — R000 Tachycardia, unspecified: Secondary | ICD-10-CM

## 2011-09-22 DIAGNOSIS — J9600 Acute respiratory failure, unspecified whether with hypoxia or hypercapnia: Secondary | ICD-10-CM

## 2011-09-22 LAB — CBC
HCT: 35 % — ABNORMAL LOW (ref 36.0–46.0)
Hemoglobin: 11.5 g/dL — ABNORMAL LOW (ref 12.0–15.0)
MCH: 30.2 pg (ref 26.0–34.0)
MCHC: 32.9 g/dL (ref 30.0–36.0)

## 2011-09-22 LAB — BASIC METABOLIC PANEL
BUN: 72 mg/dL — ABNORMAL HIGH (ref 6–23)
Chloride: 92 mEq/L — ABNORMAL LOW (ref 96–112)
GFR calc Af Amer: 21 mL/min — ABNORMAL LOW (ref >90)
Glucose, Bld: 150 mg/dL — ABNORMAL HIGH (ref 70–99)
Potassium: 2.5 mEq/L — CL (ref 3.5–5.1)

## 2011-09-22 MED ORDER — POTASSIUM CHLORIDE 10 MEQ/100ML IV SOLN
10.0000 meq | INTRAVENOUS | Status: AC
  Administered 2011-09-22: 10 meq via INTRAVENOUS
  Filled 2011-09-22: qty 100
  Filled 2011-09-22: qty 200

## 2011-09-22 MED ORDER — PIPERACILLIN-TAZOBACTAM IN DEX 2-0.25 GM/50ML IV SOLN
2.2500 g | Freq: Three times a day (TID) | INTRAVENOUS | Status: AC
  Administered 2011-09-22 – 2011-09-26 (×14): 2.25 g via INTRAVENOUS
  Filled 2011-09-22 (×15): qty 50

## 2011-09-22 MED ORDER — POTASSIUM CHLORIDE CRYS ER 20 MEQ PO TBCR: 40.0000 meq | EXTENDED_RELEASE_TABLET | Freq: Once | ORAL | Status: DC

## 2011-09-22 MED ORDER — AZITHROMYCIN 500 MG PO TABS
500.0000 mg | ORAL_TABLET | Freq: Every day | ORAL | Status: DC
  Administered 2011-09-23 – 2011-09-24 (×2): 500 mg via ORAL
  Filled 2011-09-22 (×2): qty 1

## 2011-09-22 MED ORDER — POTASSIUM CHLORIDE CRYS ER 20 MEQ PO TBCR
40.0000 meq | EXTENDED_RELEASE_TABLET | Freq: Three times a day (TID) | ORAL | Status: AC
  Administered 2011-09-22 (×3): 40 meq via ORAL
  Filled 2011-09-22: qty 2

## 2011-09-22 NOTE — Progress Notes (Signed)
Patient ID: [name]  female  ION:[id number]    DOB: 08/28/1919    DOA: 09/17/2011  PCP: Carollee Herter, MD, MD  Subjective: Still feeling dyspneic and on NRB mask, off BiPAP.  Objective: Weight change: 0.5 kg (1 lb 1.6 oz)  Intake/Output Summary (Last 24 hours) at 09/22/11 1608 Last data filed at 09/22/11 1217  Gross per 24 hour  Intake    698 ml  Output   2225 ml  Net  -1527 ml   Blood pressure 100/50, pulse 83, temperature 98.3 F (36.8 C), temperature source Oral, resp. rate 26, height 5\' 5"  (1.651 m), weight 62.6 kg (138 lb 0.1 oz), SpO2 94.00%.  Physical Exam: General: Alert and awake,on NRB mask HEENT: anicteric sclera, pupils reactive to light and accommodation, EOMI CVS: S1-S2 clear, no murmur rubs or gallops Chest: Coarse breath sounds throughout Abdomen: soft nontender, nondistended, normal bowel sounds, no organomegaly Extremities: no cyanosis, clubbing. + edema noted bilaterally   Lab Results: Basic Metabolic Panel:  Lab 09/22/11 2440 09/21/11 1400  NA 141 141  K 2.5* 3.0*  CL 92* 95*  CO2 35* 34*  GLUCOSE 150* 230*  BUN 72* 63*  CREATININE 2.26* 2.24*  CALCIUM 8.9 9.0  MG -- 2.2  PHOS -- --   Liver Function Tests:  Lab 09/18/11 0146 09/17/11 1527  AST 44* 55*  ALT 48* 50*  ALKPHOS 89 88  BILITOT 0.3 0.4  PROT 6.2 6.4  ALBUMIN 2.8* 3.0*   CBC:  Lab 09/22/11 0415 09/21/11 0354 09/18/11 0430  WBC 18.1* 14.0* --  NEUTROABS -- -- 12.2*  HGB 11.5* 11.2* --  HCT 35.0* 33.5* --  MCV 91.9 90.3 --  PLT 367 317 --   Cardiac Enzymes:  Lab 09/18/11 0954 09/18/11 0146 09/17/11 2035  CKTOTAL 71 74 88  CKMB 3.5 3.5 3.4  CKMBINDEX -- -- --  TROPONINI <0.30 <0.30 <0.30   BNP: No components found with this basename: POCBNP:2 CBG: No results found for this basename: GLUCAP:5 in the last 168 hours   Micro Results: Recent Results (from the past 240 hour(s))  CULTURE, BLOOD (ROUTINE X 2)     Status: Normal (Preliminary result)   Collection Time   09/17/11  5:19 PM      Component Value Range Status Comment   Specimen Description BLOOD LEFT ARM   Final    Special Requests BOTTLES DRAWN AEROBIC AND ANAEROBIC 10CC   Final    Culture  Setup Time [id number]   Final    Culture     Final    Value:        BLOOD CULTURE RECEIVED NO GROWTH TO DATE CULTURE WILL BE HELD FOR 5 DAYS BEFORE ISSUING A FINAL NEGATIVE REPORT   Report Status PENDING   Incomplete   CULTURE, BLOOD (ROUTINE X 2)     Status: Normal (Preliminary result)   Collection Time   09/17/11  5:23 PM      Component Value Range Status Comment   Specimen Description BLOOD RIGHT ARM   Final    Special Requests BOTTLES DRAWN AEROBIC ONLY 5CC   Final    Culture  Setup Time [id number]   Final    Culture     Final    Value:        BLOOD CULTURE RECEIVED NO GROWTH TO DATE CULTURE WILL BE HELD FOR 5 DAYS BEFORE ISSUING A FINAL NEGATIVE REPORT   Report Status PENDING   Incomplete   MRSA PCR SCREENING  Status: Normal   Collection Time   09/18/11  4:38 AM      Component Value Range Status Comment   MRSA by PCR NEGATIVE  NEGATIVE  Final     Studies/Results: Dg Chest Port 1 View  09/22/2011  *RADIOLOGY REPORT*  Clinical Data: Pneumonia.  PORTABLE CHEST - 1 VIEW  Comparison: 09/20/2011.  Findings: The PICC line is stable.  The pacer wires are unchanged. There is a persistent diffuse airspace process which could reflect edema, infiltrate or ARDS.  Small pleural effusions are noted.  No pneumothorax.  IMPRESSION: Persistent extensive airspace process.  Original Report Authenticated By: P. Loralie Champagne, M.D.   Dg Chest Port 1 View  09/20/2011  *RADIOLOGY REPORT*  Clinical Data: Line placement  PORTABLE CHEST - 1 VIEW  Comparison: 09/18/2011  Findings: Right upper extremity PICC placed with its tip at the cavoatrial junction.  Diffuse airspace disease is worse. Cardiomegaly.  Left subclavian dual lead pacemaker unchanged.  No pneumothorax.  IMPRESSION: Right PICC tip at the  cavoatrial junction.  Bilateral airspace disease worse.  Original Report Authenticated By: Donavan Burnet, M.D.   Dg Chest Port 1 View  09/18/2011  *RADIOLOGY REPORT*  Clinical Data: Respiratory distress  PORTABLE CHEST - 1 VIEW  Comparison: Chest radiograph 09/17/2011  Findings: Left-sided pacemaker overlies stable enlarged heart silhouette.  There  is increased left lower lobe atelectasis and effusion. Dense e upper lobe air space disease on the left into the six in the right.  IMPRESSION: 1.  Increase in left lower lobe atelectasis and effusion. 2.  Dense upper lobe air space disease.  Original Report Authenticated By: Genevive Bi, M.D.   Dg Chest Port 1 View  09/17/2011  *RADIOLOGY REPORT*  Clinical Data: Severe short of breath  PORTABLE CHEST - 1 VIEW  Comparison: Chest radiograph 09/16/2011  Findings: Left-sided pacemaker overlies stable cardiac silhouette. There is bilateral upper lobe air space disease more dense on the left similar to prior.  There is bibasilar effusions and atelectasis.  IMPRESSION:  1.  No improvement in upper lobe air space disease worse on the left. 2.  Bilateral pleural effusions  Original Report Authenticated By: Genevive Bi, M.D.    Medications: Scheduled Meds:   . aspirin  81 mg Oral Daily  . atenolol  25 mg Oral BID  . azithromycin  500 mg Intravenous Q24H  . azithromycin  500 mg Oral Daily  . citalopram  10 mg Oral Daily  . enoxaparin  30 mg Subcutaneous Q24H  . lactose free nutrition  237 mL Oral TID WC  . levothyroxine  88 mcg Oral Q0600  . methylPREDNISolone (SOLU-MEDROL) injection  60 mg Intravenous Q8H  . piperacillin-tazobactam (ZOSYN)  IV  2.25 g Intravenous Q8H  . polyethylene glycol  17 g Oral Daily  . potassium chloride  10 mEq Intravenous Q1 Hr x 3  . potassium chloride  40 mEq Oral TID  . senna  2 tablet Oral QHS  . sodium chloride  10-40 mL Intracatheter Q12H  . sodium chloride  3 mL Intravenous Q12H  . vancomycin  1,000 mg  Intravenous Q48H  . DISCONTD: furosemide  80 mg Intravenous Q6H  . DISCONTD: piperacillin-tazobactam (ZOSYN)  IV  2.25 g Intravenous Q6H  . DISCONTD: potassium chloride  40 mEq Oral BID  . DISCONTD: potassium chloride  40 mEq Oral Once   Continuous Infusions:    Assessment/Plan:   Acute hypoxic respiratory failure: Off BiPAP, currently on NRB mask  Due to CHF,afib +  PNA - tx each issue and follow - Continue BiPAP when necessary for support in attempts to avoid exhaustion of resp muscles  - Discussed with Dr. Tyson Alias today, pulmonology to follow, continue antibiotics and on steroids for pneumonitis  Acute on chronic diastolic heart failure  EF preserved, held diuretics today per Dr. Katrinka Blazing for significant diastolic CHF   Atrial fibrillation (onset ~ 1 month ago)  - chronically on amiodarone due to prior history of atrial flutter -  pt has refused anticoag after suffering a spontaneous intraperitoneal hemorrhage, TSH normal   ? CAP or HCAP -Chest x-ray reviewed ?ARDS vs infiltrates vs edema, continue current management and antibiotics - Appreciate pulmonology recommendations  Hypokalemia  Due to diuresis - goal is to keep > 4.0 - cont to replace   Renal insufficiency  Worsening Cr with diuresis, but little option but to continue for now given resp difficulty   Borderline UA/UTI  Should be very well covered by there current abx dosing   transaminitis  Likely related to hypoperfusion   Elevated d-Dimer  Could clearly be due to acute pulm infection, Doppler ultrasounds of the lower extremities negative for any DVT   DNR/NCB  DVT Prophylaxis: Lovenox  Disposition:Continue to monitor and step-down   LOS: 5 days   Abdulaziz Toman M.D. [facility] 09/22/2011, 4:08 PM Pager: [phone]

## 2011-09-22 NOTE — Progress Notes (Addendum)
ANTIBIOTIC CONSULT NOTE - INITIAL  Pharmacy Consult for Zosyn Indication: empiric tx of ? community acquired MRSA PNA   Assessment: 78 y.o. Female with possible CAP and renal insufficiency. On day #3 Vancomycin/Zosyn and Day #5 Azithromycin.  SrCr continues to worsened so will adjust zosyn dose.   Plan:  Change zosyn 2.25g IV q8h Monitor renal function, cultures. Will consider check vancomycin trough before next dose if renal function continues to worsen.  Bayard Hugger, PharmD, BCPS  Clinical Pharmacist  Pager: [phone]   09/22/2011,8:45 AM   Allergies  Allergen Reactions  . Amoxicillin Hives  . Ciprofloxacin Hives  . Polocaine (Mepivacaine Hcl) Hives  . Procaine Hives  . Promethazine Hcl Hives  . Quinidine   . Septra (Sulfamethoxazole W/Trimethoprim (Co-Trimoxazole)) Other (See Comments)    Dizziness and tremor  . Tiazac (Diltiazem Hcl) Hives  . Vicodin (Hydrocodone-Acetaminophen) Hives  . Warfarin Sodium     GI bleed    Patient Measurements: Height: 5\' 5"  (165.1 cm) Weight: 138 lb 0.1 oz (62.6 kg) IBW/kg (Calculated) : 57    Vital Signs: Temp: 97.8 F (36.6 C) (05/16 0825) Temp src: Oral (05/16 0825) BP: 100/50 mmHg (05/16 0825) Pulse Rate: 76  (05/16 0825) Intake/Output from previous day: 05/15 0701 - 05/16 0700 In: 879 [P.O.:300; I.V.:13; IV Piggyback:566] Out: 2525 [Urine:2525] Intake/Output from this shift: Total I/O In: -  Out: 625 [Urine:625]  Labs:  [facility] 09/22/11 0415 09/21/11 1400 09/21/11 0354  WBC 18.1* -- 14.0*  HGB 11.5* -- 11.2*  PLT 367 -- 317  LABCREA -- -- --  CREATININE 2.26* 2.24* 2.12*   Estimated Creatinine Clearance: 15.2 ml/min (by C-G formula based on Cr of 2.26). No results found for this basename: VANCOTROUGH:2,VANCOPEAK:2,VANCORANDOM:2,GENTTROUGH:2,GENTPEAK:2,GENTRANDOM:2,TOBRATROUGH:2,TOBRAPEAK:2,TOBRARND:2,AMIKACINPEAK:2,AMIKACINTROU:2,AMIKACIN:2, in the last 72 hours   Microbiology: Recent Results (from the past  720 hour(s))  CULTURE, BLOOD (ROUTINE X 2)     Status: Normal (Preliminary result)   Collection Time   09/17/11  5:19 PM      Component Value Range Status Comment   Specimen Description BLOOD LEFT ARM   Final    Special Requests BOTTLES DRAWN AEROBIC AND ANAEROBIC 10CC   Final    Culture  Setup Time [id number]   Final    Culture     Final    Value:        BLOOD CULTURE RECEIVED NO GROWTH TO DATE CULTURE WILL BE HELD FOR 5 DAYS BEFORE ISSUING A FINAL NEGATIVE REPORT   Report Status PENDING   Incomplete   CULTURE, BLOOD (ROUTINE X 2)     Status: Normal (Preliminary result)   Collection Time   09/17/11  5:23 PM      Component Value Range Status Comment   Specimen Description BLOOD RIGHT ARM   Final    Special Requests BOTTLES DRAWN AEROBIC ONLY 5CC   Final    Culture  Setup Time [id number]   Final    Culture     Final    Value:        BLOOD CULTURE RECEIVED NO GROWTH TO DATE CULTURE WILL BE HELD FOR 5 DAYS BEFORE ISSUING A FINAL NEGATIVE REPORT   Report Status PENDING   Incomplete   MRSA PCR SCREENING     Status: Normal   Collection Time   09/18/11  4:38 AM      Component Value Range Status Comment   MRSA by PCR NEGATIVE  NEGATIVE  Final     Medical History: Past Medical History  Diagnosis Date  .  Hypertension   . Arthritis     RT HIP  . Osteoporosis   . Renal insufficiency   . Atrial fib/flutter, transient   . Thyroid disease     Medications:  Scheduled:     . aspirin  81 mg Oral Daily  . atenolol  25 mg Oral BID  . azithromycin  500 mg Intravenous Q24H  . citalopram  10 mg Oral Daily  . enoxaparin  30 mg Subcutaneous Q24H  . furosemide  80 mg Intravenous Q6H  . lactose free nutrition  237 mL Oral TID WC  . levothyroxine  88 mcg Oral Q0600  . methylPREDNISolone (SOLU-MEDROL) injection  60 mg Intravenous Q8H  . piperacillin-tazobactam (ZOSYN)  IV  2.25 g Intravenous Q6H  . polyethylene glycol  17 g Oral Daily  . potassium chloride  10 mEq Intravenous Q1 Hr x 3  .  potassium chloride  10 mEq Intravenous Q1 Hr x 3  . potassium chloride  40 mEq Oral TID  . senna  2 tablet Oral QHS  . sodium chloride  10-40 mL Intracatheter Q12H  . sodium chloride  3 mL Intravenous Q12H  . vancomycin  1,000 mg Intravenous Q48H  . DISCONTD: atenolol  25 mg Oral BID  . DISCONTD: potassium chloride  40 mEq Oral Daily  . DISCONTD: potassium chloride  40 mEq Oral BID  . DISCONTD: potassium chloride  40 mEq Oral Once  . DISCONTD: vancomycin  750 mg Intravenous Q24H

## 2011-09-22 NOTE — Progress Notes (Signed)
Patient Name: [name] Date of Encounter: 09/22/2011    SUBJECTIVE: Feels better. No BIPAP for 24 hours. Very weak.  TELEMETRY:  Pacing: Filed Vitals:   09/21/11 2331 09/22/11 0434 09/22/11 0435 09/22/11 0825  BP:   109/62 100/50  Pulse:   81 76  Temp: 97.6 F (36.4 C)  97.6 F (36.4 C) 97.8 F (36.6 C)  TempSrc: Oral  Oral Oral  Resp:   21 17  Height:      Weight:  62.6 kg (138 lb 0.1 oz)    SpO2:   94% 93%    Intake/Output Summary (Last 24 hours) at 09/22/11 0853 Last data filed at 09/22/11 0828  Gross per 24 hour  Intake    879 ml  Output   2600 ml  Net  -1721 ml    LABS: Basic Metabolic Panel:  Basename 09/22/11 0415 09/21/11 1400 09/20/11 0500  NA 141 141 --  K 2.5* 3.0* --  CL 92* 95* --  CO2 35* 34* --  GLUCOSE 150* 230* --  BUN 72* 63* --  CREATININE 2.26* 2.24* --  CALCIUM 8.9 9.0 --  MG -- 2.2 1.9  PHOS -- -- --   CBC:  Basename 09/22/11 0415 09/21/11 0354  WBC 18.1* 14.0*  NEUTROABS -- --  HGB 11.5* 11.2*  HCT 35.0* 33.5*  MCV 91.9 90.3  PLT 367 317   I/O: - 7 liters since admission  Radiology/Studies:  *RADIOLOGY REPORT*  Clinical Data: Pneumonia.  PORTABLE CHEST - 1 VIEW  Comparison: 09/20/2011.  Findings: The PICC line is stable. The pacer wires are unchanged.  There is a persistent diffuse airspace process which could reflect  edema, infiltrate or ARDS. Small pleural effusions are noted. No  pneumothorax.  IMPRESSION:  Persistent extensive airspace process.  Original Report Authenticated By: P. Loralie Champagne, M.D.        Physical Exam: Blood pressure 100/50, pulse 76, temperature 97.8 F (36.6 C), temperature source Oral, resp. rate 17, height 5\' 5"  (1.651 m), weight 62.6 kg (138 lb 0.1 oz), SpO2 93.00%. Weight change: 0.5 kg (1 lb 1.6 oz)   Rale. Regular rhythm  ASSESSMENT:  1. Acute on chronic DHF now treated.  2. Persistent respiratory failure with air space disease suggests additional processes than pure  CHF as cause.  3. Acute kidney injury due to low output and diuresis.  Plan:  1. Discontinue diuretics 2. Careful repletion of potassium 3. Nutrition 4. Antibiotics and steroids per [facility] and PCCM  Signed, Lesleigh Noe 09/22/2011, 8:53 AM

## 2011-09-22 NOTE — Clinical Social Work Psychosocial (Signed)
     Clinical Social Work Department BRIEF PSYCHOSOCIAL ASSESSMENT 09/22/2011  Patient:  [name], [name]     Account Number:  [id number]     Admit date:  09/17/2011  Clinical Social Worker:  Margaree Mackintosh  Date/Time:  09/22/2011 01:31 PM  Referred by:  Care Management  Date Referred:  09/22/2011 Referred for  Psychosocial assessment   Other Referral:   Interview type:  Patient Other interview type:   with dtr present    PSYCHOSOCIAL DATA Living Status:  FAMILY Admitted from facility:   Level of care:   Primary support name:  Dawn-dtr [phone] Primary support relationship to patient:  CHILD, ADULT Degree of support available:   Adequate.    CURRENT CONCERNS Current Concerns  Adjustment to Illness   Other Concerns:   discharge planning.    SOCIAL WORK ASSESSMENT / PLAN Clinical Social Worker met with pt and dtr at bedside.  CSW introduced self, explained role, and offered support.  CSW reviewed support system and coping skills.  Pt has resided with spouse in their current home  for, "60 some years". Pt appeared easily tired during conversation, dtr engaged. CSW provided emotional support.  CSW to continue to follow and assist as needed.   Assessment/plan status:  Psychosocial Support/Ongoing Assessment of Needs Other assessment/ plan:   Information/referral to community resources:    PATIENTS/FAMILYS RESPONSE TO PLAN OF CARE: Pt appeared to tire easily, dtr was pleasant.  Both appeared receptive to CSW intervention.

## 2011-09-22 NOTE — Consult Note (Signed)
Name: [name] MRN: [id number] DOB: Dec 31, 1919    LOS: 5  PULMONARY / CRITICAL CARE MEDICINE  Reason for Consult: Hypoxic Respiratory Failure Consulting MD: Mendel Ryder  Brief history 78 year old female  w/ known h/o AF, diastolic dysfxn and CRI. Presented on 5/11:  CXR demonstrated bilateral upper lobe airspace disease. She was treated w/ aggressive diuresis, supplemental oxygen and empiric antibiotic treatment for CAP. Amiodarone was stopped on 5/13. Infiltrates persisted and associated with hypoxemia, BiPAP requirement. Empiric steroids added 5/14 for either aspiration or amiodarone pneumonitis  Current Status: Weak, wearing NRB No BiPAP last 24 hours  Vital Signs: Temp:  [96.8 F (36 C)-97.8 F (36.6 C)] 97.8 F (36.6 C) (05/16 0825) Pulse Rate:  [76-92] 76  (05/16 0825) Resp:  [17-26] 17  (05/16 0825) BP: (89-109)/(42-62) 100/50 mmHg (05/16 0825) SpO2:  [91 %-95 %] 93 % (05/16 0825) FiO2 (%):  [100 %] 100 % (05/16 0435) Weight:  [62.6 kg (138 lb 0.1 oz)] 62.6 kg (138 lb 0.1 oz) (05/16 0434)  Intake/Output Summary (Last 24 hours) at 09/22/11 1122 Last data filed at 09/22/11 4098  Gross per 24 hour  Intake    756 ml  Output   2200 ml  Net  -1444 ml    Physical Examination: General:  NIMV on face, no acute distress Neuro:  synchronous, nonfocal HEENT:  PERRL, pink conjunctivae, moist membranes Neck:  Supple, no JVD   Cardiovascular:  RRR, no M/R/G Lungs:  Bilateral diminished air entry, crackles dry apical, wet bases inspiration, no wheezing Abdomen:  Soft, nontender, nondistended, bowel sounds present Musculoskeletal:  Moves all extremities, no pedal edema Skin:  No rash  Principal Problem:  *CAP (community acquired pneumonia) Active Problems:  CHF (congestive heart failure)  Hypoxia  Abnormal LFTs  Acute respiratory failure with hypoxia  Atrial fibrillation  Diastolic heart failure  Pulmonary infiltrate  Hypothyroid  Hypokalemia  Weakness  generalized  Acute on chronic renal failure   ASSESSMENT AND PLAN  PULMONARY CXR 5/16: Diffuse bilateral L>R upper lobe predominant pulmonary infiltrates w/ small left effusion. No change from prior  A: Acute Respiratory Failure in the setting of Bilateral upper lobe predominant pulmonary infiltrates. Primary Diff dx: edema (now diuresed to the point her creatinine has bumped), CAP (currently on broad spec abx), pneumonitis (possibly amiodarone induced), aspiration w/ ALI (doubt) or some mix of any of the above. Given clinical course, I am concerned that this will not be reversible.  P:   -BiPAP prn, both for symptom/dyspnea relief and to buy time to see if this is a reversible process. She is full DNR and we will respect these wishes.  -Diurese as BUN/Creatinine will allow-->currently creatinine has increased so agree with stopping lasix; add back if able -diuresis based on CVP when renal fxn will allow -cont empiric antibiotics as below -continue solumedrol at current dosing for now, will transition to pred in coming days depending on status  CARDIOVASCULAR  Lab 09/20/11 0500 09/18/11 0954 09/18/11 0146 09/17/11 2035 09/17/11 1527  TROPONINI -- <0.30 <0.30 <0.30 --  LATICACIDVEN -- -- -- -- --  PROBNP 5485.0* -- -- -- 3331.0*   ECG:  paced Lines:  ECHO 5/12: There was mild focal basal hypertrophy of the septum. Systolic function was normal. The estimated ejection fraction was in the range of 55% to 60%.  A: Atrial Fib.  P:  -rate control per Dr Katrinka Blazing  -not a anticoagulation candidate d/t prior life threatening bleed.   A: diastolic heart  failure P:  -bp control -assess CVP  RENAL  Lab 09/22/11 0415 09/21/11 1400 09/21/11 0354 09/20/11 1800 09/20/11 0500 09/18/11 0146  NA 141 141 143 142 139 --  K 2.5* 3.0* -- -- -- --  CL 92* 95* 97 98 95* --  CO2 35* 34* 33* 33* 29 --  BUN 72* 63* 55* 52* 43* --  CREATININE 2.26* 2.24* 2.12* 2.08* 2.00* --  CALCIUM 8.9 9.0 8.9 8.8  8.8 --  MG -- 2.2 -- -- 1.9 2.0  PHOS -- -- -- -- -- --   Intake/Output      05/15 0701 - 05/16 0700 05/16 0701 - 05/17 0700   P.O. 300    I.V. (mL/kg) 13 (0.2)    IV Piggyback 566    Total Intake(mL/kg) 879 (14)    Urine (mL/kg/hr) 2525 (1.7) 625 (2.3)   Total Output 2525 625   Net -1646 -625        Stool Occurrence 4 x     Foley:  5/11  A: Acute on Chronic Renal Failure. Bl scr 1.5 range. Now elevated after aggressive diuresis  P:   - diuretics adjusted in setting rising S Cr  A: hypokalemia in setting of diuresis P: -replace and recheck   GASTROINTESTINAL  Lab 09/18/11 0146 09/17/11 1527  AST 44* 55*  ALT 48* 50*  ALKPHOS 89 88  BILITOT 0.3 0.4  PROT 6.2 6.4  ALBUMIN 2.8* 3.0*   A:  Mild elevated LFTs: improved  P:    HEMATOLOGIC  Lab 09/22/11 0415 09/21/11 0354 09/19/11 0438 09/18/11 0430 09/17/11 2036  HGB 11.5* 11.2* 11.7* 12.1 11.6*  HCT 35.0* 33.5* 36.3 36.9 35.1*  PLT 367 317 337 342 343  INR -- -- -- -- --  APTT -- -- -- -- --   A:  Anemia: no evidence of bleeding.  P:  -monitor, no role for xfusion.   INFECTIOUS  Lab 09/22/11 0415 09/21/11 0354 09/19/11 0438 09/18/11 0430 09/17/11 2036  WBC 18.1* 14.0* 15.5* 14.5* 13.0*  PROCALCITON -- -- -- -- --   Cultures: bcx2 5/11>>> MRSA PCR 5/11: neg Urine strep and legionella: negative  Antibiotics: azithro  (CAP) 5/11>>> Rocephin (CAP) 5/11>>>5/12 Cefepime (CAP) 5/12>>>5/13 Zosyn (progressive infiltrates)5/13>>> vanc(progressive infiltrates) 5/13>>>  A:  Possible CAP or HCAP P:   -currently on HCAP coverage + Azithro as above  ENDOCRINE A:  No current issues however but will be starting on IV solumedrol  P:   - recommend CBG's on solumedrol  A: hypothyroidism P:  -cont synthroid  NEUROLOGIC  A:  weakness P:   -supportive care.   BEST PRACTICE / DISPOSITION - Level of Care:  SDU - Primary Service:  [facility] - Consultants:  pulm and cards - Code Status:  Full dnr - Diet:   npo - DVT Px:  lmwh - GI Px: ppi - Skin Integrity:  intact - Social: none  Levy Pupa, MD, PhD 09/22/2011, 11:30 AM [facility] [phone] or if no answer [phone]

## 2011-09-23 DIAGNOSIS — J9600 Acute respiratory failure, unspecified whether with hypoxia or hypercapnia: Secondary | ICD-10-CM

## 2011-09-23 DIAGNOSIS — R740 Nonspecific elevation of levels of transaminase and lactic acid dehydrogenase [LDH]: Secondary | ICD-10-CM

## 2011-09-23 DIAGNOSIS — R5383 Other fatigue: Secondary | ICD-10-CM

## 2011-09-23 DIAGNOSIS — R000 Tachycardia, unspecified: Secondary | ICD-10-CM

## 2011-09-23 DIAGNOSIS — D696 Thrombocytopenia, unspecified: Secondary | ICD-10-CM

## 2011-09-23 LAB — BASIC METABOLIC PANEL
BUN: 85 mg/dL — ABNORMAL HIGH (ref 6–23)
Chloride: 96 mEq/L (ref 96–112)
Glucose, Bld: 129 mg/dL — ABNORMAL HIGH (ref 70–99)
Potassium: 3.2 mEq/L — ABNORMAL LOW (ref 3.5–5.1)

## 2011-09-23 MED ORDER — DIGOXIN 250 MCG PO TABS
0.2500 mg | ORAL_TABLET | Freq: Every day | ORAL | Status: AC
  Administered 2011-09-23: 0.25 mg via ORAL
  Filled 2011-09-23 (×2): qty 1

## 2011-09-23 MED ORDER — POTASSIUM CHLORIDE CRYS ER 20 MEQ PO TBCR
40.0000 meq | EXTENDED_RELEASE_TABLET | Freq: Once | ORAL | Status: AC
  Administered 2011-09-23: 40 meq via ORAL
  Filled 2011-09-23: qty 2

## 2011-09-23 MED ORDER — POTASSIUM CHLORIDE CRYS ER 20 MEQ PO TBCR
40.0000 meq | EXTENDED_RELEASE_TABLET | Freq: Two times a day (BID) | ORAL | Status: AC
  Administered 2011-09-23 (×2): 40 meq via ORAL
  Filled 2011-09-23 (×2): qty 2

## 2011-09-23 MED ORDER — DIGOXIN 125 MCG PO TABS
0.1250 mg | ORAL_TABLET | Freq: Every day | ORAL | Status: DC
  Administered 2011-09-24 – 2011-09-30 (×7): 0.125 mg via ORAL
  Filled 2011-09-23 (×7): qty 1

## 2011-09-23 NOTE — Progress Notes (Addendum)
RT assessed patient.  Patient is comfortable on a NRB at this time. Sats were 97% So RT decreased oxygen to a 50% venturi mask.  Sats are 90-92%. Paged Dr Delton Coombes.  Ok to Customer service manager to make it PRN for WOB and distress.

## 2011-09-23 NOTE — Progress Notes (Signed)
Name: XITLALI KASTENS MRN: [id number] DOB: 1919/07/09    LOS: 6  PULMONARY / CRITICAL CARE MEDICINE  Reason for Consult: Hypoxic Respiratory Failure Consulting MD: Mendel Ryder  Brief history 78 year old female  w/ known h/o AF, diastolic dysfxn and CRI. Presented on 5/11:  CXR demonstrated bilateral upper lobe airspace disease. She was treated w/ aggressive diuresis, supplemental oxygen and empiric antibiotic treatment for CAP. Amiodarone was stopped on 5/13. Infiltrates persisted and associated with hypoxemia, BiPAP requirement. Empiric steroids added 5/14 for either aspiration or amiodarone pneumonitis  Current Status: Weak, wearing NRB No BiPAP last 24 hours No significant clinical improvement last 48 hours on solumedrol  Vital Signs: Temp:  [97.1 F (36.2 C)-99 F (37.2 C)] 97.1 F (36.2 C) (05/17 0800) Pulse Rate:  [82-83] 82  (05/17 0711) Resp:  [16-21] 16  (05/17 0711) BP: (93-107)/(48-62) 104/51 mmHg (05/17 0711) SpO2:  [88 %-99 %] 99 % (05/17 0711) FiO2 (%):  [50 %] 50 % (05/16 2025) Weight:  [61.1 kg (134 lb 11.2 oz)] 61.1 kg (134 lb 11.2 oz) (05/17 0500)  Intake/Output Summary (Last 24 hours) at 09/23/11 1109 Last data filed at 09/23/11 0800  Gross per 24 hour  Intake    100 ml  Output   1529 ml  Net  -1429 ml    Physical Examination: General: no acute distress, profoundly weak Neuro:  Nonfocal, moves all ext but globally weak HEENT:  PERRL, pink conjunctivae, moist membranes Neck:  Supple, no JVD   Cardiovascular:  RRR, no M/R/G Lungs:  Bilateral diminished air entry, crackles dry apical, wet bases inspiration, no wheezing Abdomen:  Soft, nontender, nondistended, bowel sounds present Musculoskeletal:  Moves all extremities, no pedal edema Skin:  No rash  Principal Problem:  *CAP (community acquired pneumonia) Active Problems:  CHF (congestive heart failure)  Hypoxia  Abnormal LFTs  Acute respiratory failure with hypoxia  Atrial fibrillation  Diastolic heart failure  Pulmonary infiltrate  Hypothyroid  Hypokalemia  Weakness generalized  Acute on chronic renal failure   ASSESSMENT AND PLAN  PULMONARY CXR 5/16: Diffuse bilateral L>R upper lobe predominant pulmonary infiltrates w/ small left effusion. No change from prior  A: Acute Respiratory Failure in the setting of Bilateral upper lobe predominant pulmonary infiltrates. Primary Diff dx: edema (now diuresed to the point her creatinine has bumped), CAP (currently on broad spec abx), pneumonitis (possibly amiodarone induced), aspiration w/ ALI (doubt) or some mix of any of the above. Given clinical course and her current status, I am concerned that this will not be reversible.  P:   -BiPAP prn, both for symptom/dyspnea relief and to buy time to see if this is a reversible process. She is full DNR and we will respect these wishes.  -Diurese as BUN/Creatinine will allow-->currently creatinine has increased so agree with stopping lasix; add back if able -diuresis based on CVP when renal fxn will allow -cont empiric antibiotics as below -continue solumedrol at current dosing for now, will transition to pred in coming days depending on status -need to broach the discussion of end-of-life issues with patient and daughter  CARDIOVASCULAR  Lab 09/20/11 0500 09/18/11 0954 09/18/11 0146 09/17/11 2035 09/17/11 1527  TROPONINI -- <0.30 <0.30 <0.30 --  LATICACIDVEN -- -- -- -- --  PROBNP 5485.0* -- -- -- 3331.0*   ECG:  paced Lines:  ECHO 5/12: There was mild focal basal hypertrophy of the septum. Systolic function was normal. The estimated ejection fraction was in the range of 55% to 60%.  A: Atrial Fib.  P:  -rate control per Dr Katrinka Blazing  -not a anticoagulation candidate d/t prior life threatening bleed.   A: diastolic heart failure P:  -bp control -assess CVP  RENAL  Lab 09/23/11 0430 09/22/11 0415 09/21/11 1400 09/21/11 0354 09/20/11 1800 09/20/11 0500 09/18/11 0146  NA  144 141 141 143 142 -- --  K 3.2* 2.5* -- -- -- -- --  CL 96 92* 95* 97 98 -- --  CO2 37* 35* 34* 33* 33* -- --  BUN 85* 72* 63* 55* 52* -- --  CREATININE 2.39* 2.26* 2.24* 2.12* 2.08* -- --  CALCIUM 8.7 8.9 9.0 8.9 8.8 -- --  MG -- -- 2.2 -- -- 1.9 2.0  PHOS -- -- -- -- -- -- --   Intake/Output      05/16 0701 - 05/17 0700 05/17 0701 - 05/18 0700   P.O.     I.V. (mL/kg)     IV Piggyback 100    Total Intake(mL/kg) 100 (1.6)    Urine (mL/kg/hr) 1975 (1.3) 175 (0.7)   Stool 4    Total Output 1979 175   Net -1879 -175         Foley:  5/11  A: Acute on Chronic Renal Failure. Bl scr 1.5 range. Now elevated after aggressive diuresis  P:   - diuretics adjusted in setting rising S Cr  A: hypokalemia in setting of diuresis P: -replace and recheck   GASTROINTESTINAL  Lab 09/18/11 0146 09/17/11 1527  AST 44* 55*  ALT 48* 50*  ALKPHOS 89 88  BILITOT 0.3 0.4  PROT 6.2 6.4  ALBUMIN 2.8* 3.0*   A:  Mild elevated LFTs: improved  P:    HEMATOLOGIC  Lab 09/22/11 0415 09/21/11 0354 09/19/11 0438 09/18/11 0430 09/17/11 2036  HGB 11.5* 11.2* 11.7* 12.1 11.6*  HCT 35.0* 33.5* 36.3 36.9 35.1*  PLT 367 317 337 342 343  INR -- -- -- -- --  APTT -- -- -- -- --   A:  Anemia: no evidence of bleeding.  P:  -monitor, no role for xfusion.   INFECTIOUS  Lab 09/22/11 0415 09/21/11 0354 09/19/11 0438 09/18/11 0430 09/17/11 2036  WBC 18.1* 14.0* 15.5* 14.5* 13.0*  PROCALCITON -- -- -- -- --   Cultures: bcx2 5/11>>> MRSA PCR 5/11: neg Urine strep and legionella: negative  Antibiotics: azithro  (CAP) 5/11>>> Rocephin (CAP) 5/11>>>5/12 Cefepime (CAP) 5/12>>>5/13 Zosyn (progressive infiltrates)5/13>>> vanc(progressive infiltrates) 5/13>>>  A:  Possible CAP or HCAP, has been on abx for 7 days as of 5/17 P:   -currently on HCAP coverage + Azithro as above  ENDOCRINE A:  hyperglycemia P:   - recommend CBG's on solumedrol  A: hypothyroidism P:  -cont  synthroid  NEUROLOGIC  A:  weakness P:   -supportive care.  -PT/OT  BEST PRACTICE / DISPOSITION - Level of Care:  SDU - Primary Service:  [facility] - Consultants:  pulm and cards - Code Status:  Full dnr; I would recommend discussion of possible palliation - Diet:  npo - DVT Px:  lmwh - GI Px: ppi - Skin Integrity:  intact - Social: none  Levy Pupa, MD, PhD 09/23/2011, 11:09 AM [facility] [phone] or if no answer [phone]

## 2011-09-23 NOTE — Progress Notes (Signed)
Patient Name: [name] Date of Encounter: 09/23/2011    SUBJECTIVE: Poor intake. Stable clinical status over the last 24 hours.  TELEMETRY:  A fib with controlled rate. Filed Vitals:   09/22/11 2025 09/22/11 2350 09/23/11 0425 09/23/11 0500  BP: 93/48 106/50 107/62   Pulse: 82 82 82   Temp: 97.8 F (36.6 C) 97.5 F (36.4 C) 97.9 F (36.6 C)   TempSrc: Oral Oral Oral   Resp: 21 20 16    Height:      Weight:    61.1 kg (134 lb 11.2 oz)  SpO2: 88% 96% 96%     Intake/Output Summary (Last 24 hours) at 09/23/11 0738 Last data filed at 09/23/11 1610  Gross per 24 hour  Intake    100 ml  Output   1979 ml  Net  -1879 ml   I/O net: -8.5 L LABS: Basic Metabolic Panel:  Basename 09/23/11 0430 09/22/11 0415 09/21/11 1400  NA 144 141 --  K 3.2* 2.5* --  CL 96 92* --  CO2 37* 35* --  GLUCOSE 129* 150* --  BUN 85* 72* --  CREATININE 2.39* 2.26* --  CALCIUM 8.7 8.9 --  MG -- -- 2.2  PHOS -- -- --   CBC:  Basename 09/22/11 0415 09/21/11 0354  WBC 18.1* 14.0*  NEUTROABS -- --  HGB 11.5* 11.2*  HCT 35.0* 33.5*  MCV 91.9 90.3  PLT 367 317   I/O: negative 1.8 Liter last 24 hours despite no diuretic therapy  Radiology/Studies:  No new data  Physical Exam: Blood pressure 107/62, pulse 82, temperature 97.9 F (36.6 C), temperature source Oral, resp. rate 16, height 5\' 5"  (1.651 m), weight 61.1 kg (134 lb 11.2 oz), SpO2 96.00%. Weight change: -1.5 kg (-3 lb 4.9 oz)   Less rales.  No murmur or gallop.  No edema  ASSESSMENT:  1. Acute on chronic diastolic heart failure. Diuresis has been aggressive and now on hold due to kidney injury  2. A/C kidney injury, aggravated by diuresis  3. Atrial fibrillation with borderline controlled rate.   Plan:  1. Replete potassium 2. Resume diuresis when appropriate 3.Continue respiratory support, antibiotics, and ?steroids 4. Digitalize for future rate control, since rhythm control will not be possible.  Selinda Eon 09/23/2011, 7:38 AM

## 2011-09-23 NOTE — Progress Notes (Signed)
Utilization review completed.  

## 2011-09-23 NOTE — Evaluation (Signed)
Physical Therapy Evaluation Patient Details Name: [name] MRN: [id number] DOB: [birth year] Today's Date: 09/23/2011 Time: 1191-4782 PT Time Calculation (min): 26 min  PT Assessment / Plan / Recommendation Clinical Impression  Pt presents with a medical diagnosis of PNA limited by decreased endurance and decreased strength from prolonged bed rest. Pt educated on the importance of mobility and upright positioning as well as deep breathing to increase lung function. Pt will benefit from skilled PT in the acute care setting in order to maximize functional mobility and endurance prior to d/c. Family and pt do not want a SNF, although pt may be able to return home due to decreased assist.    PT Assessment  Patient needs continued PT services    Follow Up Recommendations  Skilled nursing facility;Home health PT;Supervision/Assistance - 24 hour (SNF vs [facility] 24/7)    Barriers to Discharge Decreased caregiver support husband's patient is on hospice and received 24/7 care. Caretaker unable to care for pt and husband at the same time    lEquipment Recommendations  Other (comment) (TBD)    Recommendations for Other Services     Frequency Min 3X/week    Precautions / Restrictions Restrictions Weight Bearing Restrictions: No   Pertinent Vitals/Pain Sitting at EOB, pt dropped to 84% sats. RN aware. Pt able to recover to 89-91% upon sitting, RN stated that it is fine to ambulate pt. During ambulation, pt able to maintain 89-90% on Venturi mask.       Mobility  Bed Mobility Bed Mobility: Supine to Sit;Sitting - Scoot to Edge of Bed;Sit to Supine Supine to Sit: 3: Mod assist;With rails;HOB elevated Sitting - Scoot to Edge of Bed: 3: Mod assist Sit to Supine: 3: Mod assist;With rail;HOB elevated Details for Bed Mobility Assistance: VC for sequencing and hand placement. Assist for trunk and pelvis into sitting. Transfers Transfers: Sit to Stand;Stand to Sit Sit to Stand: 4: Min assist;With  upper extremity assist;From bed Stand to Sit: 4: Min assist;With upper extremity assist;To bed Details for Transfer Assistance: VC for hand placement. Upon standing, pt feeling dizzy. BP taken, no change from supine or sitting although pt requested to sit. Pt able to tolerate second stand. Ambulation/Gait Ambulation/Gait Assistance: 1: +2 Total assist Ambulation/Gait: Patient Percentage: 80% Ambulation Distance (Feet): 15 Feet Assistive device: 2 person hand held assist Ambulation/Gait Assistance Details: VC for upright posture throughout. Distance limited by pt endurance and sats. See vitals. Gait Pattern: Step-to pattern;Decreased hip/knee flexion - right;Decreased hip/knee flexion - left;Narrow base of support;Trunk flexed Gait velocity: decreased gait speed    Exercises     PT Diagnosis: Difficulty walking;Generalized weakness  PT Problem List: Decreased strength;Decreased activity tolerance;Decreased mobility;Decreased knowledge of use of DME;Pain;Cardiopulmonary status limiting activity PT Treatment Interventions: DME instruction;Stair training;Gait training;Functional mobility training;Therapeutic activities;Therapeutic exercise;Patient/family education   PT Goals Acute Rehab PT Goals PT Goal Formulation: With patient/family Time For Goal Achievement: 10/07/11 Potential to Achieve Goals: Fair Pt will go Supine/Side to Sit: with modified independence PT Goal: Supine/Side to Sit - Progress: Goal set today Pt will go Sit to Supine/Side: with modified independence PT Goal: Sit to Supine/Side - Progress: Goal set today Pt will go Sit to Stand: with supervision PT Goal: Sit to Stand - Progress: Goal set today Pt will go Stand to Sit: with supervision PT Goal: Stand to Sit - Progress: Goal set today Pt will Transfer Bed to Chair/Chair to Bed: with supervision PT Transfer Goal: Bed to Chair/Chair to Bed - Progress: Goal set today Pt  will Ambulate: with min assist;51 - 150 feet;with  least restrictive assistive device PT Goal: Ambulate - Progress: Goal set today Pt will Go Up / Down Stairs: 3-5 stairs;with supervision;with rail(s) PT Goal: Up/Down Stairs - Progress: Goal set today  Visit Information  Last PT Received On: 09/23/11 Assistance Needed: +2    Subjective Data      Prior Functioning  Home Living Lives With: Spouse Available Help at Discharge: Family;Personal care attendant;Available 24 hours/day Type of Home: House Home Access: Stairs to enter [employer] of Steps: 4 Entrance Stairs-Rails: Can reach both Home Layout: One level Bathroom Shower/Tub: Walk-in Contractor: Handicapped height Bathroom Accessibility: Yes How Accessible: Accessible via walker Home Adaptive Equipment: Walker - rolling;Straight cane;Shower chair without back Prior Function Level of Independence: Independent (uses cane ocasionally) Able to Take Stairs?: Yes Driving: Yes Vocation: Retired Musician: No difficulties Dominant Hand: Right    Cognition  Overall Cognitive Status: Appears within functional limits for tasks assessed/performed Arousal/Alertness: Awake/alert Orientation Level: Appears intact for tasks assessed Behavior During Session: [facility] for tasks performed    Extremity/Trunk Assessment Right Lower Extremity Assessment RLE ROM/Strength/Tone: Deficits RLE ROM/Strength/Tone Deficits: Overall MMT >/= 3+/5 RLE Sensation: WFL - Light Touch Left Lower Extremity Assessment LLE ROM/Strength/Tone: Deficits LLE ROM/Strength/Tone Deficits: Overall MMT >/= 3+/5 LLE Sensation: WFL - Light Touch   Balance    End of Session PT - End of Session Equipment Utilized During Treatment: Gait belt;Oxygen Activity Tolerance: Patient limited by fatigue;Treatment limited secondary to medical complications (Comment) Patient left: in bed;with call bell/phone within reach;with family/visitor present Nurse Communication: Mobility  status;Other (comment) (O2)   Deondray Ospina, Richelle Ito 09/23/2011, 4:10 PM  09/23/2011 Milana Kidney DPT PAGER: [phone] OFFICE: [phone]

## 2011-09-23 NOTE — Progress Notes (Signed)
[facility] [place] TEAM 1 - Stepdown/ICU TEAM  PCP:  Carollee Herter, MD, MD  Subjective: 78 y.o. female who presented with progressive dyspnea for 3 weeks associated with worsening peripheral edema.  She was started by Dr. Katrinka Blazing on Lasix and this was titrated up to 80 BID, but she still experienced no improvement in her symptoms.   The pt is resting more comfortably today.  She states that she is feeling less short of breath.  She denies cp, n/v, abdom pain, or ha.  She is asking when she will be able to go home.    Objective:  Intake/Output Summary (Last 24 hours) at 09/23/11 1506 Last data filed at 09/23/11 1310  Gross per 24 hour  Intake    460 ml  Output   1331 ml  Net   -871 ml   Blood pressure 113/58, pulse 81, temperature 97.6 F (36.4 C), temperature source Oral, resp. rate 16, height 5\' 5"  (1.651 m), weight 61.1 kg (134 lb 11.2 oz), SpO2 96.00%.  Physical Exam: General: now on face mask - appears more comfortable - able to complete multiple sentences between breaths Lungs: poor air movement th/o - fine crackles diffusely Cardiovascular: distant - regular rate - no gallup or rub appreciable Abdomen: Nontender, nondistended, soft, bowel sounds positive, no rebound, no ascites, no appreciable mass Extremities: trace B le edema  Lab Results:  Basename 09/23/11 0430 09/22/11 0415 09/21/11 1400  NA 144 141 141  K 3.2* 2.5* 3.0*  CL 96 92* 95*  CO2 37* 35* 34*  GLUCOSE 129* 150* 230*  BUN 85* 72* 63*  CREATININE 2.39* 2.26* 2.24*  CALCIUM 8.7 8.9 9.0  MG -- -- 2.2  PHOS -- -- --    Basename 09/22/11 0415 09/21/11 0354  WBC 18.1* 14.0*  NEUTROABS -- --  HGB 11.5* 11.2*  HCT 35.0* 33.5*  MCV 91.9 90.3  PLT 367 317   Studies/Results: All recent x-ray/radiology reports have been reviewed in detail.   Medications: I have reviewed the patient's complete medication list.  Assessment/Plan:  Acute respiratory failure  Due to diastolic CHF + afib  + PNA + ?amio toxicity - tx each issue and follow - BIPAP for support prn in attempts to avoid exhaustion of resp muscles - appreciate pulm consult   Acute on chronic diastolic heart failure EF preserved via echo this admit - aggressive diuresis as per Dr. Katrinka Blazing for signif diastolic CHF - lasix dosing now being limited by worsening renal function - watch Is/Os and crt and resume lasix when indicated  Atrial fibrillation (onset ~ 1 month ago) W/ prior hx of aflutter - chronically on amio - pt refuses anticoag after suffering a spontaneous intraperitoneal hemorrhage - TSH is normal - see plan as per Dr. Katrinka Blazing - now off amio for fear of amio pneumonitis  ? CAP Pt is not responding well to current tx - CXR initially revealed LUL infiltrate and now reveals diffuse infiltrates- changed to zosyn to cover aspiration, and added vanc to cover community acquired MRSA - f/u CXR in AM  Hypokalemia Due to diuresis - goal is to keep > 4.0 - cont to replace  Renal insufficiency Worsening Cr with diuresis so lasix dosing stopped for now  Borderline UA/UTI Should be very well covered by there current abx dosing  transaminitis Likely related to hypoperfusion  Elevated d-Dimer Could clearly be due to acute pulm infection - LE dopplers negative for DVT - contrasted CT would be high risk for signif  renal failure   DNR/NCB Agree that Palliative Care consult could be beneficial to pt - will discuss with family   Lonia Blood, MD [facility] Office  [phone] Pager [phone]  On-Call/Text Page:      [email or web address]      password [facility]

## 2011-09-24 ENCOUNTER — Inpatient Hospital Stay (HOSPITAL_COMMUNITY): Payer: MEDICARE

## 2011-09-24 LAB — CULTURE, BLOOD (ROUTINE X 2): Culture: NO GROWTH

## 2011-09-24 LAB — BASIC METABOLIC PANEL
BUN: 81 mg/dL — ABNORMAL HIGH (ref 6–23)
CO2: 33 mEq/L — ABNORMAL HIGH (ref 19–32)
Chloride: 100 mEq/L (ref 96–112)
Creatinine, Ser: 2.04 mg/dL — ABNORMAL HIGH (ref 0.50–1.10)
Glucose, Bld: 142 mg/dL — ABNORMAL HIGH (ref 70–99)

## 2011-09-24 MED ORDER — SODIUM CHLORIDE 0.9 % IJ SOLN
INTRAMUSCULAR | Status: AC
  Administered 2011-09-24: 20 mL
  Filled 2011-09-24: qty 10

## 2011-09-24 MED ORDER — METHYLPREDNISOLONE SODIUM SUCC 125 MG IJ SOLR
60.0000 mg | Freq: Two times a day (BID) | INTRAMUSCULAR | Status: DC
  Administered 2011-09-25 – 2011-09-26 (×3): 60 mg via INTRAVENOUS
  Administered 2011-09-27: 125 mg via INTRAVENOUS
  Filled 2011-09-24 (×5): qty 0.96

## 2011-09-24 MED ORDER — ENOXAPARIN SODIUM 30 MG/0.3ML ~~LOC~~ SOLN
30.0000 mg | SUBCUTANEOUS | Status: DC
  Administered 2011-09-24 – 2011-09-29 (×6): 30 mg via SUBCUTANEOUS
  Filled 2011-09-24 (×7): qty 0.3

## 2011-09-24 MED ORDER — BIOTENE DRY MOUTH MT LIQD
15.0000 mL | Freq: Two times a day (BID) | OROMUCOSAL | Status: DC
  Administered 2011-09-24 – 2011-09-30 (×12): 15 mL via OROMUCOSAL

## 2011-09-24 MED ORDER — CHLORHEXIDINE GLUCONATE 0.12 % MT SOLN
15.0000 mL | Freq: Two times a day (BID) | OROMUCOSAL | Status: DC
  Administered 2011-09-24 – 2011-09-30 (×11): 15 mL via OROMUCOSAL
  Filled 2011-09-24 (×14): qty 15

## 2011-09-24 MED ORDER — BACID PO TABS
2.0000 | ORAL_TABLET | Freq: Two times a day (BID) | ORAL | Status: DC
  Administered 2011-09-24 – 2011-09-30 (×12): 2 via ORAL
  Filled 2011-09-24 (×17): qty 2

## 2011-09-24 NOTE — Progress Notes (Signed)
Name: [name] MRN: [id number] DOB: [birth year]    LOS: 7  PULMONARY / CRITICAL CARE MEDICINE  Reason for Consult: Hypoxic Respiratory Failure Consulting MD: Mendel Ryder  Brief history 78 year old female  w/ known h/o AF, diastolic dysfxn and CRI. Presented on 5/11:  CXR demonstrated bilateral upper lobe airspace disease. She was treated w/ aggressive diuresis, supplemental oxygen and empiric antibiotic treatment for CAP. Amiodarone was stopped on 5/13. Infiltrates persisted and associated with hypoxemia, BiPAP requirement. Empiric steroids added 5/14 for either aspiration or amiodarone pneumonitis  Current Status: Very weak, wearing NRB Not very communicative, mild increased wob Did not use bilevel last night  Vital Signs: Temp:  [96.6 F (35.9 C)-97.6 F (36.4 C)] 97.6 F (36.4 C) (05/18 0851) Pulse Rate:  [80-83] 83  (05/18 0851) Resp:  [14-22] 22  (05/18 0851) BP: (110-136)/(53-67) 117/53 mmHg (05/18 0851) SpO2:  [90 %-96 %] 96 % (05/18 0851) FiO2 (%):  [50 %] 50 % (05/18 0851) Weight:  [61.3 kg (135 lb 2.3 oz)] 61.3 kg (135 lb 2.3 oz) (05/18 0405)  Intake/Output Summary (Last 24 hours) at 09/24/11 1110 Last data filed at 09/24/11 1007  Gross per 24 hour  Intake    860 ml  Output   1183 ml  Net   -323 ml    Physical Examination: General: no acute distress, profoundly weak Neuro:  Nonfocal, moves all ext  Cardiovascular:  RRR, no M/R/G Chest with scattered crackles, no wheezing LE without edema or cyanosis   Principal Problem:  *CAP (community acquired pneumonia) Active Problems:  CHF (congestive heart failure)  Hypoxia  Abnormal LFTs  Acute respiratory failure with hypoxia  Atrial fibrillation  Diastolic heart failure  Pulmonary infiltrate  Hypothyroid  Hypokalemia  Weakness generalized  Acute on chronic renal failure   ASSESSMENT AND PLAN  PULMONARY CXR 5/16: Diffuse bilateral L>R upper lobe predominant pulmonary infiltrates w/ small left  effusion. No change from prior  A: Acute Respiratory Failure in the setting of Bilateral upper lobe predominant pulmonary infiltrates. Primary Diff dx: edema (now diuresed to the point her creatinine has bumped), CAP (currently on broad spec abx), pneumonitis (possibly amiodarone induced), aspiration w/ ALI (doubt) or some mix of any of the above. Given clinical course and her current status, I am concerned that this will not be reversible.  P:   -the pt has been treated with abx, diuresis, and steroids.  She does not appear to be getting better, and in fact getting weaker.  There is nothing else to offer from pulmonary perpesctive except to continue current plan    A: diastolic heart failure P:  -bp control, diurese as renal function allows.   RENAL  Lab 09/24/11 0407 09/23/11 0430 09/22/11 0415 09/21/11 1400 09/21/11 0354 09/20/11 0500 09/18/11 0146  NA 145 144 141 141 143 -- --  K 3.9 3.2* -- -- -- -- --  CL 100 96 92* 95* 97 -- --  CO2 33* 37* 35* 34* 33* -- --  BUN 81* 85* 72* 63* 55* -- --  CREATININE 2.04* 2.39* 2.26* 2.24* 2.12* -- --  CALCIUM 8.9 8.7 8.9 9.0 8.9 -- --  MG -- -- -- 2.2 -- 1.9 2.0  PHOS -- -- -- -- -- -- --   Intake/Output      05/17 0701 - 05/18 0700 05/18 0701 - 05/19 0700   P.O. 600 480   I.V. (mL/kg)  20 (0.3)   IV Piggyback     Total Intake(mL/kg)  600 (9.8) 500 (8.2)   Urine (mL/kg/hr) 900 (0.6) 455 (1.8)   Stool 4    Total Output 904 455   Net -304 +45        Stool Occurrence 3 x      A: Acute on Chronic Renal Failure. Bl scr 1.5 range. Now elevated after aggressive diuresis  P:   - diuretics adjusted in setting rising S Cr  INFECTIOUS  Lab 09/22/11 0415 09/21/11 0354 09/19/11 0438 09/18/11 0430 09/17/11 2036  WBC 18.1* 14.0* 15.5* 14.5* 13.0*  PROCALCITON -- -- -- -- --   Cultures: bcx2 5/11>>> MRSA PCR 5/11: neg Urine strep and legionella: negative  Antibiotics: azithro  (CAP) 5/11>>> Rocephin (CAP) 5/11>>>5/12 Cefepime (CAP)  5/12>>>5/13 Zosyn (progressive infiltrates)5/13>>> vanc(progressive infiltrates) 5/13>>>  A:  Possible CAP or HCAP, has been on abx for 7 days as of 5/17 P:   -currently on HCAP coverage + Azithro as above  The pt is doing poorly despite aggressive medical care.  Would consider discussing further with family the topic of palliation.  Will check again on Monday.

## 2011-09-24 NOTE — Progress Notes (Addendum)
[facility] [place] TEAM 1 - Stepdown/ICU TEAM  PCP:  Carollee Herter, MD, MD  Subjective: 78 y.o. female who presented with progressive dyspnea for 3 weeks associated with worsening peripheral edema.  She was started by Dr. Katrinka Blazing on Lasix and this was titrated up to 80 BID, but she still experienced no improvement in her symptoms.   The pt states that she feels the same that she did yesterday/her SOB is w/o change.  She denies cp, n/v, abdom pain, or ha.    Objective:  Intake/Output Summary (Last 24 hours) at 09/24/11 1313 Last data filed at 09/24/11 1223  Gross per 24 hour  Intake    740 ml  Output   1287 ml  Net   -547 ml   Blood pressure 126/56, pulse 83, temperature 97.6 F (36.4 C), temperature source Oral, resp. rate 18, height 5\' 5"  (1.651 m), weight 61.3 kg (135 lb 2.3 oz), SpO2 95.00%.  Physical Exam: General: on face mask - appears more comfortable - able to complete multiple sentences between breaths Lungs: poor air movement th/o - fine crackles diffusely, but less pronounced Cardiovascular: distant - regular rate - no gallup or rub appreciable Abdomen: Nontender, nondistended, soft, bowel sounds positive, no rebound, no ascites, no appreciable mass Extremities: no signif edema B LE  Lab Results:  Basename 09/24/11 0407 09/23/11 0430 09/22/11 0415 09/21/11 1400  NA 145 144 141 --  K 3.9 3.2* 2.5* --  CL 100 96 92* --  CO2 33* 37* 35* --  GLUCOSE 142* 129* 150* --  BUN 81* 85* 72* --  CREATININE 2.04* 2.39* 2.26* --  CALCIUM 8.9 8.7 8.9 --  MG -- -- -- 2.2  PHOS -- -- -- --    Basename 09/22/11 0415  WBC 18.1*  NEUTROABS --  HGB 11.5*  HCT 35.0*  MCV 91.9  PLT 367   Studies/Results: All recent x-ray/radiology reports have been reviewed in detail.   Medications: I have reviewed the patient's complete medication list.  Assessment/Plan:  Acute respiratory failure  Due to diastolic CHF + afib + PNA + ?amio toxicity - tx each issue and  follow - BIPAP for support prn in attempts to avoid exhaustion of resp muscles - appreciate pulm consult - appears to be reaching a point of fragile stability (one that still requires high flow oxygen nonetheless)  Acute on chronic diastolic heart failure EF preserved via echo this admit - aggressively diuresed as per Dr. Katrinka Blazing for signif diastolic CHF - lasix dosing now being limited by worsening renal function - watch Is/Os and crt and resume lasix when indicated - pt remains in negative balance despite no further diuretic  Atrial fibrillation (onset ~ 1 month ago) W/ prior hx of aflutter - chronically on amio - pt refuses anticoag after suffering a spontaneous intraperitoneal hemorrhage - TSH is normal - see plan as per Dr. Katrinka Blazing - now off amio for fear of amio pneumonitis  ? CAP Pt appears to be slowly improving - CXR initially revealed LUL infiltrate and now reveals diffuse infiltrates- changed to zosyn to cover aspiration, and added vanc to cover community acquired MRSA - f/u CXR supports modest improvement   Hypokalemia Due to diuresis - goal is to keep > 4.0 - cont to replace  Renal insufficiency Worsening Cr with diuresis so lasix dosing stopped for now - crt has begun to improve in turn  Borderline UA/UTI Should be very well covered by there current abx dosing  transaminitis Likely related  to hypoperfusion - will recheck in am Monday  Elevated d-Dimer Could clearly be due to acute pulm infection - LE dopplers negative for DVT - contrasted CT would be high risk for signif renal failure   DNR/NCB Agree that Palliative Care consult could be beneficial to pt - will discuss with family  Loose stools D/c miralax - if persists, will consider C diff eval, but clinical suspicion low at this time  Disposition Cont to support in SDU   Lonia Blood, MD [facility] Office  [phone] Pager [phone]  On-Call/Text Page:      [email or web address]      password  [facility]

## 2011-09-24 NOTE — Progress Notes (Signed)
Patient ID: [name], female   DOB: [birth year], 78 y.o.   MRN: [id number] Chart reviewed. BUN/CR improving. No change in recommendations today. Will follow.

## 2011-09-25 MED ORDER — SODIUM CHLORIDE 0.9 % IJ SOLN
INTRAMUSCULAR | Status: AC
  Administered 2011-09-25: 20 mL
  Filled 2011-09-25: qty 10

## 2011-09-25 NOTE — Progress Notes (Signed)
Patient ID: [name], female   DOB: 24-Feb-1920, 78 y.o.   MRN: [id number] Chart reviewed. Nothing new to add to Dr Vassie Moment thorough assessment.

## 2011-09-25 NOTE — Progress Notes (Signed)
[facility] [place] TEAM 1 - Stepdown/ICU TEAM  PCP:  Carollee Herter, MD, MD  Subjective: 78 y.o. female who presented with progressive dyspnea for 3 weeks associated with worsening peripheral edema.  She was started by Dr. Katrinka Blazing on Lasix and this was titrated up to 80 BID, but she still experienced no improvement in her symptoms.   The pt swears that she does not feel any better.  She does, however, look to be much improved.  She is on [place] O2 and able to carry on a conversation w/o extended pauses.  There is no family present at the time of my visit today.  Objective:  Intake/Output Summary (Last 24 hours) at 09/25/11 1618 Last data filed at 09/25/11 1600  Gross per 24 hour  Intake    910 ml  Output   1330 ml  Net   -420 ml   Blood pressure 135/55, pulse 83, temperature 97.9 F (36.6 C), temperature source Oral, resp. rate 23, height 5\' 5"  (1.651 m), weight 60.5 kg (133 lb 6.1 oz), SpO2 93.00%.  Physical Exam: General: now on [place] O2 - appears comfortable - able to complete multiple sentences between breaths Lungs: poor air movement th/o - fine crackles diffusely, but less pronounced Cardiovascular: distant - regular rate - no gallup or rub appreciable Abdomen: Nontender, nondistended, soft, bowel sounds positive, no rebound, no ascites, no appreciable mass Extremities: no signif edema B LE  Lab Results:  Basename 09/24/11 0407 09/23/11 0430  NA 145 144  K 3.9 3.2*  CL 100 96  CO2 33* 37*  GLUCOSE 142* 129*  BUN 81* 85*  CREATININE 2.04* 2.39*  CALCIUM 8.9 8.7  MG -- --  PHOS -- --   No results found for this basename: WBC:3,NEUTROABS:3,HGB:3,HCT:3,MCV:3,PLT:3 in the last 72 hours  Studies/Results: All recent x-ray/radiology reports have been reviewed in detail.   Medications: I have reviewed the patient's complete medication list.  Assessment/Plan:  Acute respiratory failure  Due to diastolic CHF + afib + PNA + ?amio toxicity - tx each issue and  follow - BIPAP for support prn in attempts to avoid exhaustion of resp muscles - appreciate pulm consult - appears to be reaching a point of fragile stability   Acute on chronic diastolic heart failure EF preserved via echo this admit - aggressively diuresed as per Dr. Katrinka Blazing for signif diastolic CHF - lasix dosing now being limited by worsening renal function - watch Is/Os and crt and resume lasix when indicated - pt remains in negative balance despite no further diuretic  Atrial fibrillation (onset ~ 1 month ago) W/ prior hx of aflutter - chronically on amio - pt refuses anticoag after suffering a spontaneous intraperitoneal hemorrhage - TSH is normal - see plan as per Dr. Katrinka Blazing - now off amio for fear of amio pneumonitis  ? CAP Pt appears to be slowly improving - CXR initially revealed LUL infiltrate and now reveals diffuse infiltrates- changed to zosyn to cover aspiration, and added vanc to cover community acquired MRSA - f/u CXR supports modest improvement - will recheck CXR in AM and d/c abx after 7 days of tx  Hypokalemia Due to diuresis - goal is to keep > 4.0 - cont to replace  Renal insufficiency Worsening Cr with diuresis so lasix dosing stopped for now - crt has begun to improve in turn  Borderline UA/UTI Should be very well covered by there current abx dosing  transaminitis Likely related to hypoperfusion - will recheck in am Monday  Elevated d-Dimer Could clearly be due to acute pulm infection - LE dopplers negative for DVT - contrasted CT would be high risk for signif renal failure   DNR/NCB Agree that Palliative Care consult could be beneficial to pt - will discuss with family  Loose stools D/c miralax - if persists, will consider C diff eval, but clinical suspicion low at this time  Disposition Cont to support in SDU   Lonia Blood, MD [facility] Office  [phone] Pager [phone]  On-Call/Text Page:      [email or web address]      password  [facility]

## 2011-09-25 NOTE — Progress Notes (Signed)
ANTIBIOTIC CONSULT NOTE - FOLLOW UP  Pharmacy Consult for Vancomycin and Zosyn Indication: pneumonia  Allergies  Allergen Reactions  . Amoxicillin Hives  . Ciprofloxacin Hives  . Polocaine (Mepivacaine Hcl) Hives  . Procaine Hives  . Promethazine Hcl Hives  . Quinidine   . Septra (Sulfamethoxazole W/Trimethoprim (Co-Trimoxazole)) Other (See Comments)    Dizziness and tremor  . Tiazac (Diltiazem Hcl) Hives  . Vicodin (Hydrocodone-Acetaminophen) Hives  . Warfarin Sodium     GI bleed    Patient Measurements: Height: 5\' 5"  (165.1 cm) Weight: 133 lb 6.1 oz (60.5 kg) IBW/kg (Calculated) : 57   Vital Signs: Temp: 97.8 F (36.6 C) (05/19 0800) Temp src: Oral (05/19 0800) BP: 139/61 mmHg (05/19 0800) Pulse Rate: 82  (05/19 0520) Intake/Output from previous day: 05/18 0701 - 05/19 0700 In: 850 [P.O.:780; I.V.:20; IV Piggyback:50] Out: 1690 [Urine:1690] Intake/Output from this shift: Total I/O In: 380 [P.O.:360; I.V.:20] Out: 105 [Urine:105]  Labs:  Basename 09/24/11 0407 09/23/11 0430  WBC -- --  HGB -- --  PLT -- --  LABCREA -- --  CREATININE 2.04* 2.39*   Estimated Creatinine Clearance: 16.8 ml/min (by C-G formula based on Cr of 2.04). No results found for this basename: VANCOTROUGH:2,VANCOPEAK:2,VANCORANDOM:2,GENTTROUGH:2,GENTPEAK:2,GENTRANDOM:2,TOBRATROUGH:2,TOBRAPEAK:2,TOBRARND:2,AMIKACINPEAK:2,AMIKACINTROU:2,AMIKACIN:2, in the last 72 hours   Microbiology: Recent Results (from the past 720 hour(s))  CULTURE, BLOOD (ROUTINE X 2)     Status: Normal   Collection Time   09/17/11  5:19 PM      Component Value Range Status Comment   Specimen Description BLOOD LEFT ARM   Final    Special Requests BOTTLES DRAWN AEROBIC AND ANAEROBIC 10CC   Final    Culture  Setup Time [id number]   Final    Culture NO GROWTH 5 DAYS   Final    Report Status 09/24/2011 FINAL   Final   CULTURE, BLOOD (ROUTINE X 2)     Status: Normal   Collection Time   09/17/11  5:23 PM   Component Value Range Status Comment   Specimen Description BLOOD RIGHT ARM   Final    Special Requests BOTTLES DRAWN AEROBIC ONLY 5CC   Final    Culture  Setup Time [id number]   Final    Culture NO GROWTH 5 DAYS   Final    Report Status 09/24/2011 FINAL   Final   MRSA PCR SCREENING     Status: Normal   Collection Time   09/18/11  4:38 AM      Component Value Range Status Comment   MRSA by PCR NEGATIVE  NEGATIVE  Final     Anti-infectives     Start     Dose/Rate Route Frequency Ordered Stop   09/23/11 1000   azithromycin (ZITHROMAX) tablet 500 mg  Status:  Discontinued        500 mg Oral Daily 09/22/11 1554 09/24/11 1323   09/22/11 1400  piperacillin-tazobactam (ZOSYN) IVPB 2.25 g       2.25 g 100 mL/hr over 30 Minutes Intravenous 3 times per day 09/22/11 0850     09/21/11 2000   vancomycin (VANCOCIN) IVPB 1000 mg/200 mL premix        1,000 mg 200 mL/hr over 60 Minutes Intravenous Every 48 hours 09/21/11 1316     09/20/11 1000   ceFEPIme (MAXIPIME) 500 mg in dextrose 5 % 50 mL IVPB  Status:  Discontinued        500 mg 100 mL/hr over 30 Minutes Intravenous Every 24 hours 09/19/11 1021 09/19/11 1748  09/19/11 2100   piperacillin-tazobactam (ZOSYN) IVPB 2.25 g  Status:  Discontinued        2.25 g 100 mL/hr over 30 Minutes Intravenous 4 times per day 09/19/11 1848 09/22/11 0850   09/19/11 2000   vancomycin (VANCOCIN) 750 mg in sodium chloride 0.9 % 150 mL IVPB  Status:  Discontinued        750 mg 150 mL/hr over 60 Minutes Intravenous Every 24 hours 09/19/11 1848 09/21/11 1316   09/18/11 2000   azithromycin (ZITHROMAX) tablet 500 mg  Status:  Discontinued        500 mg Oral Every 24 hours 09/18/11 0303 09/18/11 1012   09/18/11 1900   azithromycin (ZITHROMAX) 500 mg in dextrose 5 % 250 mL IVPB        500 mg 250 mL/hr over 60 Minutes Intravenous Every 24 hours 09/18/11 1012 09/22/11 2055   09/18/11 1800   cefTRIAXone (ROCEPHIN) 1 g in dextrose 5 % 50 mL IVPB  Status:   Discontinued        1 g 100 mL/hr over 30 Minutes Intravenous Every 24 hours 09/18/11 0303 09/18/11 1007   09/18/11 1100   ceFEPIme (MAXIPIME) 1 g in dextrose 5 % 50 mL IVPB  Status:  Discontinued        1 g 100 mL/hr over 30 Minutes Intravenous Every 12 hours 09/18/11 1007 09/19/11 1020   09/17/11 1900   cefTRIAXone (ROCEPHIN) 1 g in dextrose 5 % 50 mL IVPB        1 g 100 mL/hr over 30 Minutes Intravenous  Once 09/17/11 1852 09/17/11 1938   09/17/11 1900   azithromycin (ZITHROMAX) 500 mg in dextrose 5 % 250 mL IVPB        500 mg 250 mL/hr over 60 Minutes Intravenous  Once 09/17/11 1852 09/17/11 2127          Assessment: Pneumonia: On Day #6 of antibiotic therapy with Vancomycin and Zosyn.  Renal function is stable and doses are adjusted for renal insufficiency.  Goal of Therapy:  Vancomycin trough level 15-20 mcg/ml  Plan:  Continue Zosyn 2.25gm IV q8h and Vancomycin 1gm IV q48h Anticipate checking Vancomycin trough on 5/21. Will also check BMET at that time if not checked on 5/20.  Estella Husk, Pharm.D., BCPS Clinical Pharmacist  Phone [phone] Pager [phone] 09/25/2011, 11:19 AM

## 2011-09-26 ENCOUNTER — Inpatient Hospital Stay (HOSPITAL_COMMUNITY): Payer: MEDICARE

## 2011-09-26 DIAGNOSIS — R0902 Hypoxemia: Secondary | ICD-10-CM

## 2011-09-26 DIAGNOSIS — R740 Nonspecific elevation of levels of transaminase and lactic acid dehydrogenase [LDH]: Secondary | ICD-10-CM

## 2011-09-26 DIAGNOSIS — D696 Thrombocytopenia, unspecified: Secondary | ICD-10-CM

## 2011-09-26 DIAGNOSIS — I5030 Unspecified diastolic (congestive) heart failure: Secondary | ICD-10-CM

## 2011-09-26 DIAGNOSIS — J9600 Acute respiratory failure, unspecified whether with hypoxia or hypercapnia: Secondary | ICD-10-CM

## 2011-09-26 DIAGNOSIS — R000 Tachycardia, unspecified: Secondary | ICD-10-CM

## 2011-09-26 LAB — COMPREHENSIVE METABOLIC PANEL
ALT: 41 U/L — ABNORMAL HIGH (ref 0–35)
BUN: 55 mg/dL — ABNORMAL HIGH (ref 6–23)
CO2: 36 mEq/L — ABNORMAL HIGH (ref 19–32)
Calcium: 9 mg/dL (ref 8.4–10.5)
Creatinine, Ser: 1.48 mg/dL — ABNORMAL HIGH (ref 0.50–1.10)
GFR calc Af Amer: 35 mL/min — ABNORMAL LOW (ref >90)
GFR calc non Af Amer: 30 mL/min — ABNORMAL LOW (ref >90)
Glucose, Bld: 126 mg/dL — ABNORMAL HIGH (ref 70–99)

## 2011-09-26 LAB — CBC
Hemoglobin: 12.7 g/dL (ref 12.0–15.0)
MCHC: 32.1 g/dL (ref 30.0–36.0)
RBC: 4.26 MIL/uL (ref 3.87–5.11)

## 2011-09-26 MED ORDER — POTASSIUM CHLORIDE CRYS ER 20 MEQ PO TBCR
20.0000 meq | EXTENDED_RELEASE_TABLET | Freq: Two times a day (BID) | ORAL | Status: DC
  Administered 2011-09-26 – 2011-09-27 (×3): 20 meq via ORAL
  Filled 2011-09-26 (×4): qty 1

## 2011-09-26 NOTE — Progress Notes (Signed)
Patient Name: [name] Date of Encounter: 09/26/2011    SUBJECTIVE: Improved compared to last week.  TELEMETRY:  A fib with V pace at 80 bpm Filed Vitals:   09/25/11 2159 09/25/11 2325 09/26/11 0346 09/26/11 0805  BP:  145/74 151/71   Pulse:  83    Temp:  97.9 F (36.6 C)  97.4 F (36.3 C)  TempSrc:  Oral Oral Oral  Resp:  20    Height:      Weight:   62.1 kg (136 lb 14.5 oz)   SpO2: 94% 91%      Intake/Output Summary (Last 24 hours) at 09/26/11 0859 Last data filed at 09/26/11 0805  Gross per 24 hour  Intake    890 ml  Output   1450 ml  Net   -560 ml    LABS: Basic Metabolic Panel:  Basename 09/26/11 0359 09/24/11 0407  NA 146* 145  K 3.4* 3.9  CL 100 100  CO2 36* 33*  GLUCOSE 126* 142*  BUN 55* 81*  CREATININE 1.48* 2.04*  CALCIUM 9.0 8.9  MG -- --  PHOS -- --   CBC:  Basename 09/26/11 0359  WBC 18.7*  NEUTROABS --  HGB 12.7  HCT 39.6  MCV 93.0  PLT 373   I/O: Net 9 liters negative since admission Radiology/Studies:  Stable bilateral infiltrates this AM  Physical Exam: Blood pressure 151/71, pulse 83, temperature 97.4 F (36.3 C), temperature source Oral, resp. rate 20, height 5\' 5"  (1.651 m), weight 62.1 kg (136 lb 14.5 oz), SpO2 91.00%. Weight change: 1.6 kg (3 lb 8.4 oz)   Sitting in chair Lungs are clearer  ASSESSMENT: 1. Breathing continues to improve and was clearly not solely due to A/C diastolic HF since 9 L negative and there is persistent infiltrates now 3 days after diuretics stopped.  2, Acute kidney injury is improving off diuretics  3. A fib with controlled rate   Plan:  1. Continue to hold diuretic therapy as renal function improves. 2. Replete potassium 3. Resume chronic diuretic in next several days.  Selinda Eon 09/26/2011, 8:59 AM

## 2011-09-26 NOTE — Progress Notes (Addendum)
[facility] [place] TEAM 1 - Stepdown/ICU TEAM  PCP:  Carollee Herter, MD, MD  Subjective: 78 y.o. female who presented with progressive dyspnea for 3 weeks associated with worsening peripheral edema.  She was started by Dr. Katrinka Blazing on Lasix and this was titrated up to 80 BID, but she still experienced no improvement in her symptoms.   The pt continues to swears that she does not feel any better.  She does, however, continue to look to be much improved.  She is on [place] O2 and able to carry on a conversation w/o extended pauses.  There is no family present at the time of my visit again today.  Objective:  Intake/Output Summary (Last 24 hours) at 09/26/11 1637 Last data filed at 09/26/11 1500  Gross per 24 hour  Intake   1100 ml  Output   1325 ml  Net   -225 ml   Blood pressure 151/71, pulse 83, temperature 97.7 F (36.5 C), temperature source Oral, resp. rate 20, height 5\' 5"  (1.651 m), weight 62.1 kg (136 lb 14.5 oz), SpO2 91.00%.  Physical Exam: General: now on  O2 - appears comfortable - able to complete multiple sentences between breaths Lungs: poor air movement th/o - fine crackles diffusely, but less pronounced Cardiovascular: distant - regular rate - no gallup or rub appreciable Abdomen: Nontender, nondistended, soft, bowel sounds positive, no rebound, no ascites, no appreciable mass Extremities: no signif edema B LE  Lab Results:  [facility] 09/26/11 0359 09/24/11 0407  NA 146* 145  K 3.4* 3.9  CL 100 100  CO2 36* 33*  GLUCOSE 126* 142*  BUN 55* 81*  CREATININE 1.48* 2.04*  CALCIUM 9.0 8.9  MG -- --  PHOS -- --    Basename 09/26/11 0359  WBC 18.7*  NEUTROABS --  HGB 12.7  HCT 39.6  MCV 93.0  PLT 373    Studies/Results: All recent x-ray/radiology reports have been reviewed in detail.   Medications: I have reviewed the patient's complete medication list.  Assessment/Plan:  Acute respiratory failure  Due to diastolic CHF + afib + PNA +  ?amio toxicity - tx each issue and follow - BIPAP for support prn in attempts to avoid exhaustion of resp muscles - appreciate pulm consult - continues to make guarded progress - overall looks dramatically better that at time of admit  Acute on chronic diastolic heart failure EF preserved via echo this admit - aggressively diuresed as per Dr. Katrinka Blazing for signif diastolic CHF - lasix dosing now being limited by worsening renal function - watch Is/Os and crt and resume lasix when indicated - pt remains in negative balance despite no further diuretic - I agree with Dr. Katrinka Blazing that simple volume overload does not appear to have been the primary issue  Atrial fibrillation (onset ~ 1 month ago) W/ prior hx of aflutter - chronically on amio - pt refuses anticoag after suffering a spontaneous intraperitoneal hemorrhage - TSH is normal - see plan as per Dr. Katrinka Blazing - now off amio for fear of amio pneumonitis  ? CAP Pt appears to be slowly improving - CXR initially revealed LUL infiltrate and now reveals diffuse infiltrates- changed to zosyn to cover aspiration, and added vanc to cover community acquired MRSA - pt has improved significantly - will d/c abx after 7 days of tx (today is day #7)  Hypokalemia Due to diuresis - goal is to keep > 4.0 - cont to replace  Renal insufficiency Worsening Cr with diuresis so  lasix dosing stopped for now - crt has begun to improve in turn  Borderline UA/UTI Should have been well covered by zosyn + vanc - no clinical indication of persistent infection  transaminitis Likely related to hypoperfusion - resolved  Elevated d-Dimer Could clearly be due to acute pulm infection - LE dopplers negative for DVT - contrasted CT would be high risk for signif renal failure   DNR/NCB Agree that Palliative Care consult could be beneficial to pt - hope to discuss with family today if they arrive for a visit  Loose stools D/c miralax - if persists, will consider C diff eval, but  clinical suspicion low at this time  Disposition Will likely be clinically stable for transfer to tele bed in AM  Lonia Blood, MD [facility] Office  [phone] Pager [phone]  On-Call/Text Page:      [email or web address]      password [facility]

## 2011-09-26 NOTE — Progress Notes (Signed)
Name: [name] MRN: [id number] DOB: [birth year]    LOS: 9  PULMONARY / CRITICAL CARE MEDICINE  Reason for Consult: Hypoxic Respiratory Failure Consulting MD: Mendel Ryder  Brief history 78 year old female  w/ known h/o AF, diastolic dysfxn and CRI. Presented on 5/11:  CXR demonstrated bilateral upper lobe airspace disease. She was treated w/ aggressive diuresis, supplemental oxygen and empiric antibiotic treatment for CAP. Amiodarone was stopped on 5/13. Infiltrates persisted and associated with hypoxemia, BiPAP requirement. Empiric steroids added 5/14 for either aspiration or amiodarone pneumonitis  Current Status: Feels weak.  Not much cough.  Weaned down to nasal cannula.  Vital Signs: Temp:  [97.4 F (36.3 C)-97.9 F (36.6 C)] 97.4 F (36.3 C) (05/20 0805) Pulse Rate:  [82-83] 83  (05/19 2325) Resp:  [20-23] 20  (05/19 2325) BP: (135-151)/(55-74) 151/71 mmHg (05/20 0346) SpO2:  [91 %-96 %] 91 % (05/19 2325) Weight:  [136 lb 14.5 oz (62.1 kg)] 136 lb 14.5 oz (62.1 kg) (05/20 0346)  Intake/Output Summary (Last 24 hours) at 09/26/11 1024 Last data filed at 09/26/11 0805  Gross per 24 hour  Intake    650 ml  Output   1450 ml  Net   -800 ml    Physical Examination: General - sitting in chair HEENT - no sinus tenderness Cardiac - s1s2, no murmur Chest - b/l rales, no wheeze Abd - soft, nontender Ext - no edema Neuro - A&O x 3   BMET    Component Value Date/Time   NA 146* 09/26/2011 0359   K 3.4* 09/26/2011 0359   CL 100 09/26/2011 0359   CO2 36* 09/26/2011 0359   GLUCOSE 126* 09/26/2011 0359   BUN 55* 09/26/2011 0359   CREATININE 1.48* 09/26/2011 0359   CREATININE 2.09* 04/13/2011 1645   CALCIUM 9.0 09/26/2011 0359   GFRNONAA 30* 09/26/2011 0359   GFRAA 35* 09/26/2011 0359    CBC    Component Value Date/Time   WBC 18.7* 09/26/2011 0359   RBC 4.26 09/26/2011 0359   HGB 12.7 09/26/2011 0359   HCT 39.6 09/26/2011 0359   PLT 373 09/26/2011 0359   MCV 93.0 09/26/2011  0359   MCH 29.8 09/26/2011 0359   MCHC 32.1 09/26/2011 0359   RDW 15.5 09/26/2011 0359   LYMPHSABS 0.6* 09/18/2011 0430   MONOABS 1.6* 09/18/2011 0430   EOSABS 0.0 09/18/2011 0430   BASOSABS 0.0 09/18/2011 0430    .Dg Chest Port 1 View  09/26/2011  *RADIOLOGY REPORT*  Clinical Data: Infiltrate  PORTABLE CHEST - 1 VIEW  Comparison: 09/24/2011  Findings: Right PICC stable.  Dual lead left subclavian pacemaker stable.  Bilateral diffuse airspace disease stable.  Low volumes stable.  No pneumothorax.  IMPRESSION: Stable airspace disease.  Original Report Authenticated By: Donavan Burnet, M.D.    Lab Results  Component Value Date   ALT 41* 09/26/2011   AST 27 09/26/2011   ALKPHOS 92 09/26/2011   BILITOT 0.4 09/26/2011    BNP (last 3 results)  Basename 09/20/11 0500 09/17/11 1527  PROBNP 5485.0* 3331.0*    Lab Results  Component Value Date   ESRSEDRATE 47* 09/19/2011    ASSESSMENT AND PLAN  PULMONARY  A: Acute Respiratory Failure in the setting of Bilateral upper lobe predominant pulmonary infiltrates. Primary Diff dx: edema (now diuresed to the point her creatinine has bumped), CAP (currently on broad spec abx), pneumonitis (possibly amiodarone induced), aspiration w/ ALI (doubt) or some mix of any of the above. Given clinical  course and her current status, I am concerned that this will not be reversible.  P:   -continue solumedrol -diuresis when renal fx improves -f/u CXR later this week -titrate oxygen to keep SpO2 > 92%  A: diastolic heart failure P:  -bp control, diurese as renal function allows.   INFECTIOUS  Cultures: bcx2 5/11>>>negative MRSA PCR 5/11: neg Urine strep and legionella: negative  Antibiotics: azithro  (CAP) 5/11>>>5/16 Rocephin (CAP) 5/11>>>5/12 Cefepime (CAP) 5/12>>>5/13 Zosyn (progressive infiltrates)5/13>>> vanc(progressive infiltrates) 5/13>>>  A:  Possible CAP or HCAP P:   -D10/x Abx  Code status >> DNR  Coralyn Helling, MD 09/26/2011, 10:29  AM Pager:  [phone]

## 2011-09-27 ENCOUNTER — Inpatient Hospital Stay (HOSPITAL_COMMUNITY): Payer: MEDICARE

## 2011-09-27 DIAGNOSIS — D696 Thrombocytopenia, unspecified: Secondary | ICD-10-CM

## 2011-09-27 DIAGNOSIS — R000 Tachycardia, unspecified: Secondary | ICD-10-CM

## 2011-09-27 DIAGNOSIS — D7289 Other specified disorders of white blood cells: Secondary | ICD-10-CM

## 2011-09-27 DIAGNOSIS — J9600 Acute respiratory failure, unspecified whether with hypoxia or hypercapnia: Secondary | ICD-10-CM

## 2011-09-27 LAB — BASIC METABOLIC PANEL
GFR calc non Af Amer: 35 mL/min — ABNORMAL LOW (ref >90)
Glucose, Bld: 154 mg/dL — ABNORMAL HIGH (ref 70–99)
Potassium: 3.6 mEq/L (ref 3.5–5.1)
Sodium: 141 mEq/L (ref 135–145)

## 2011-09-27 MED ORDER — POTASSIUM CHLORIDE CRYS ER 20 MEQ PO TBCR
40.0000 meq | EXTENDED_RELEASE_TABLET | Freq: Two times a day (BID) | ORAL | Status: DC
  Filled 2011-09-27 (×2): qty 2

## 2011-09-27 MED ORDER — METHYLPREDNISOLONE SODIUM SUCC 40 MG IJ SOLR
40.0000 mg | Freq: Two times a day (BID) | INTRAMUSCULAR | Status: DC
  Administered 2011-09-27: 40 mg via INTRAVENOUS
  Filled 2011-09-27 (×3): qty 1

## 2011-09-27 MED ORDER — NYSTATIN 100000 UNIT/ML MT SUSP
10.0000 mL | Freq: Four times a day (QID) | OROMUCOSAL | Status: DC
  Administered 2011-09-27 – 2011-09-30 (×13): 1000000 [IU] via ORAL
  Filled 2011-09-27 (×16): qty 10

## 2011-09-27 MED ORDER — FUROSEMIDE 40 MG PO TABS
40.0000 mg | ORAL_TABLET | Freq: Once | ORAL | Status: AC
  Administered 2011-09-27: 40 mg via ORAL
  Filled 2011-09-27: qty 1

## 2011-09-27 NOTE — Progress Notes (Addendum)
Patient Name: [name] Date of Encounter: 09/27/2011    SUBJECTIVE: Still feels weak but no dyspnea.  TELEMETRY:  A fib with V pacing: Filed Vitals:   09/27/11 0400 09/27/11 0800 09/27/11 1025 09/27/11 1124  BP: 157/79  149/74 158/77  Pulse: 83  86 83  Temp: 97.5 F (36.4 C) 98.5 F (36.9 C)  98.2 F (36.8 C)  TempSrc: Oral   Oral  Resp: 21   16  Height:      Weight: 62.9 kg (138 lb 10.7 oz)     SpO2: 95%   98%    Intake/Output Summary (Last 24 hours) at 09/27/11 1330 Last data filed at 09/27/11 1125  Gross per 24 hour  Intake    790 ml  Output   1175 ml  Net   -385 ml    LABS: Basic Metabolic Panel:  Basename 09/27/11 0836 09/26/11 0359  NA 141 146*  K 3.6 3.4*  CL 98 100  CO2 34* 36*  GLUCOSE 154* 126*  BUN 45* 55*  CREATININE 1.30* 1.48*  CALCIUM 8.8 9.0  MG -- --  PHOS -- --   CBC:  Basename 09/26/11 0359  WBC 18.7*  NEUTROABS --  HGB 12.7  HCT 39.6  MCV 93.0  PLT 373     Radiology/Studies:  No new  Physical Exam: Blood pressure 158/77, pulse 83, temperature 98.2 F (36.8 C), temperature source Oral, resp. rate 16, height 5\' 5"  (1.651 m), weight 62.9 kg (138 lb 10.7 oz), SpO2 98.00%. Weight change: 0.8 kg (1 lb 12.2 oz)   Frail.  Faint posterior rales  ASSESSMENT:  1. CHF, stable but need to resume diuretic therapy   Plan:  1. Lasix 40-60 mg po daily  Signed, Lesleigh Noe 09/27/2011, 1:30 PM

## 2011-09-27 NOTE — Progress Notes (Signed)
Physical Therapy Treatment Patient Details Name: [name] MRN: [id number] DOB: [birth year] Today's Date: 09/27/2011 Time:  -     PT Assessment / Plan / Recommendation Comments on Treatment Session  Patient progress very well today with ambulation and activity tolerance. Despite patient requiring encouragement to participate with therapy she did very well.     Follow Up Recommendations  Skilled nursing facility;Supervision/Assistance - 24 hour;Home health PT (SNF vs [facility] 24/7)    Barriers to Discharge        Equipment Recommendations   (TBD)    Recommendations for Other Services    Frequency Min 3X/week   Plan Discharge plan remains appropriate    Precautions / Restrictions     Pertinent Vitals/Pain Complained of headache. RN made aware.     Mobility  Transfers Sit to Stand: 4: Min guard;With upper extremity assist;With armrests;From chair/3-in-1 Stand to Sit: 4: Min guard;With upper extremity assist;With armrests;To chair/3-in-1 Details for Transfer Assistance: Patient stood x3 with cues for safe hand placement.  Ambulation/Gait Ambulation/Gait Assistance: 4: Min assist Ambulation Distance (Feet): 120 Feet Assistive device: Rolling walker Ambulation/Gait Assistance Details: A with RW management. Cues for upright posture and positioning inside of RW.  Gait Pattern: Decreased stride length;Step-through pattern;Trunk flexed Gait velocity: decreased    Exercises General Exercises - Lower Extremity Ankle Circles/Pumps: AROM;Both;10 reps;Seated Long Arc Quad: AROM;Both;10 reps;Seated Hip Flexion/Marching: AROM;Both;10 reps;Seated   PT Diagnosis:    PT Problem List:   PT Treatment Interventions:     PT Goals Acute Rehab PT Goals PT Goal: Sit to Stand - Progress: Progressing toward goal PT Goal: Stand to Sit - Progress: Progressing toward goal PT Transfer Goal: Bed to Chair/Chair to Bed - Progress: Progressing toward goal PT Goal: Ambulate - Progress: Progressing  toward goal (would like to ensure consistency of progressoin)  Visit Information  Last PT Received On: 09/27/11    Subjective Data  Subjective: I just can't do it, I hurt so bad.    Cognition  Overall Cognitive Status: Appears within functional limits for tasks assessed/performed Arousal/Alertness: Awake/alert Orientation Level: Appears intact for tasks assessed Behavior During Session: [facility] for tasks performed    Balance     End of Session PT - End of Session Equipment Utilized During Treatment: Gait belt Activity Tolerance: Patient tolerated treatment well Patient left: in chair;with call bell/phone within reach Nurse Communication: Mobility status    Fredrich Birks 09/27/2011, 2:59 PM 09/27/2011 Fredrich Birks PTA [phone] pager [phone] office

## 2011-09-27 NOTE — Progress Notes (Signed)
Name: [name] MRN: [id number] DOB: Mar 24, 1920    LOS: 10  PULMONARY / CRITICAL CARE MEDICINE  Reason for Consult: Hypoxic Respiratory Failure Consulting MD: Mendel Ryder  Brief history 78 year old female  w/ known h/o AF, diastolic dysfxn and CRI. Presented on 5/11:  CXR demonstrated bilateral upper lobe airspace disease. She was treated w/ aggressive diuresis, supplemental oxygen and empiric antibiotic treatment for CAP. Amiodarone was stopped on 5/13. Infiltrates persisted and associated with hypoxemia, BiPAP requirement. Empiric steroids added 5/14 for either aspiration or amiodarone pneumonitis  Current Status: Feels weak.  Not much cough.  Weaned down to nasal cannula, and now down to 3 liters.  Vital Signs: Temp:  [97.5 F (36.4 C)-98.5 F (36.9 C)] 98.2 F (36.8 C) (05/21 1124) Pulse Rate:  [82-86] 83  (05/21 1124) Resp:  [16-23] 16  (05/21 1124) BP: (147-158)/(61-79) 158/77 mmHg (05/21 1124) SpO2:  [95 %-98 %] 98 % (05/21 1124) Weight:  [138 lb 10.7 oz (62.9 kg)] 138 lb 10.7 oz (62.9 kg) (05/21 0400)  Intake/Output Summary (Last 24 hours) at 09/27/11 1130 Last data filed at 09/27/11 1125  Gross per 24 hour  Intake   1110 ml  Output   1175 ml  Net    -65 ml    Physical Examination: General - sitting in chair HEENT - no sinus tenderness Cardiac - s1s2, no murmur Chest - b/l rales, no wheeze Abd - soft, nontender Ext - no edema Neuro - A&O x 3   BMET    Component Value Date/Time   NA 141 09/27/2011 0836   K 3.6 09/27/2011 0836   CL 98 09/27/2011 0836   CO2 34* 09/27/2011 0836   GLUCOSE 154* 09/27/2011 0836   BUN 45* 09/27/2011 0836   CREATININE 1.30* 09/27/2011 0836   CREATININE 2.09* 04/13/2011 1645   CALCIUM 8.8 09/27/2011 0836   GFRNONAA 35* 09/27/2011 0836   GFRAA 41* 09/27/2011 0836    CBC    Component Value Date/Time   WBC 18.7* 09/26/2011 0359   RBC 4.26 09/26/2011 0359   HGB 12.7 09/26/2011 0359   HCT 39.6 09/26/2011 0359   PLT 373 09/26/2011 0359    MCV 93.0 09/26/2011 0359   MCH 29.8 09/26/2011 0359   MCHC 32.1 09/26/2011 0359   RDW 15.5 09/26/2011 0359   LYMPHSABS 0.6* 09/18/2011 0430   MONOABS 1.6* 09/18/2011 0430   EOSABS 0.0 09/18/2011 0430   BASOSABS 0.0 09/18/2011 0430    .Dg Chest Port 1 View  09/26/2011  *RADIOLOGY REPORT*  Clinical Data: Infiltrate  PORTABLE CHEST - 1 VIEW  Comparison: 09/24/2011  Findings: Right PICC stable.  Dual lead left subclavian pacemaker stable.  Bilateral diffuse airspace disease stable.  Low volumes stable.  No pneumothorax.  IMPRESSION: Stable airspace disease.  Original Report Authenticated By: Donavan Burnet, M.D.    Lab Results  Component Value Date   ALT 41* 09/26/2011   AST 27 09/26/2011   ALKPHOS 92 09/26/2011   BILITOT 0.4 09/26/2011    BNP (last 3 results)  Basename 09/20/11 0500 09/17/11 1527  PROBNP 5485.0* 3331.0*    Lab Results  Component Value Date   ESRSEDRATE 47* 09/19/2011    ASSESSMENT AND PLAN  PULMONARY  A: Acute Respiratory Failure in the setting of Bilateral upper lobe predominant pulmonary infiltrates. Primary Diff dx: edema (now diuresed to the point her creatinine has bumped), CAP (currently on broad spec abx), pneumonitis (possibly amiodarone induced), aspiration w/ ALI (doubt) or some mix of  any of the above.    P:   -change solumedrol to 40 mg q12h -diuresis when renal fx improves>>give dose of lasix 40 mg x one 5/21 -f/u CXR 5/22 -titrate oxygen to keep SpO2 > 92%  A: diastolic heart failure P:  -bp control, diurese as renal function allows.   INFECTIOUS  Cultures: bcx2 5/11>>>negative MRSA PCR 5/11: neg Urine strep and legionella: negative  Antibiotics: azithro  (CAP) 5/11>>>5/16 Rocephin (CAP) 5/11>>>5/12 Cefepime (CAP) 5/12>>>5/13 Zosyn (progressive infiltrates)5/13>>> vanc(progressive infiltrates) 5/13>>>  A:  Possible CAP or HCAP P:   -D11/x Abx  Code status >> DNR  Coralyn Helling, MD 09/27/2011, 11:30 AM Pager:  [phone]

## 2011-09-27 NOTE — Progress Notes (Signed)
[facility] [place] TEAM 1 - Stepdown/ICU TEAM  PCP:  Carollee Herter, MD, MD  Subjective: 78 y.o. female who presented with progressive dyspnea for 3 weeks associated with worsening peripheral edema.  She was started by Dr. Katrinka Blazing on Lasix and this was titrated up to 80 BID, but she still experienced no improvement in her symptoms.   Reclined in Chair today- speaking in very weak voice and telling me she feels extremely weak. She looks significantly better respiratory-wise than she did last week.   Objective:  Intake/Output Summary (Last 24 hours) at 09/27/11 1906 Last data filed at 09/27/11 1700  Gross per 24 hour  Intake    600 ml  Output   1200 ml  Net   -600 ml   Blood pressure 139/63, pulse 80, temperature 97.5 F (36.4 C), temperature source Oral, resp. rate 23, height 5\' 5"  (1.651 m), weight 62.9 kg (138 lb 10.7 oz), SpO2 97.00%.  Physical Exam: General: now on [place] O2 - appears comfortable - able to complete multiple sentences between breaths Lungs: poor air movement th/o -  crackles diffusely, but less pronounced Cardiovascular: distant - regular rate - no gallup or rub appreciable Abdomen: Nontender, nondistended, soft, bowel sounds positive, no rebound, no ascites, no appreciable mass Extremities: no signif edema B LE  Lab Results:  [facility] 09/27/11 0836 09/26/11 0359  NA 141 146*  K 3.6 3.4*  CL 98 100  CO2 34* 36*  GLUCOSE 154* 126*  BUN 45* 55*  CREATININE 1.30* 1.48*  CALCIUM 8.8 9.0  MG -- --  PHOS -- --    Basename 09/26/11 0359  WBC 18.7*  NEUTROABS --  HGB 12.7  HCT 39.6  MCV 93.0  PLT 373    Studies/Results: All recent x-ray/radiology reports have been reviewed in detail.   Medications: I have reviewed the patient's complete medication list.  Assessment/Plan:  Acute respiratory failure with b/l upper lobe infiltrates and crackle through-out entire lung fields  Due to diastolic CHF + afib + PNA + ?amio toxicity -No longer  in need of Bipap- appreciate pulm consult - continues to make guarded progress - overall looks dramatically better that at time of admit - Dr Craige Cotta evaluated to and feels it may not be reversible. Her recommends to cont steroids (for possible Amio pneumonitis) and f/u CXR later this week  Acute on chronic diastolic heart failure EF preserved via echo this admit - aggressively diuresed as per Dr. Katrinka Blazing for signif diastolic CHF - lasix dosing stopped due to ARF - Renal function now improving Dr Katrinka Blazing has resumed Lasix today   Atrial fibrillation (onset ~ 1 month ago) W/ prior hx of aflutter - chronically on amio - pt refuses anticoag after suffering a spontaneous intraperitoneal hemorrhage - TSH is normal - see plan as per Dr. Katrinka Blazing - now off amio for fear of amio toxicity  ? CAP Pt appears to be slowly improving - CXR initially revealed LUL infiltrate and now reveals diffuse infiltrates- changed to zosyn to cover aspiration and added vanc to cover community acquired MRSA  (5/13) - pt has improved significantly - 7 days of Zenaida Niece and Zosyn completed.  Hypokalemia Due to diuresis - goal is to keep > 4.0 - cont to replace- will increase KCL from 20 BID to 40 BID as Lasix is being resumed today. Follow.   Renal insufficiency Worsening Cr with diuresis so lasix dosing stopped for now - crt has begun to improve in turn  Borderline UA/UTI Should  have been well covered by zosyn + vanc - no clinical indication of persistent infection  transaminitis Likely related to hypoperfusion - resolved  Elevated d-Dimer Could clearly be due to acute pulm infection - LE dopplers negative for DVT - contrasted CT would be high risk for signif renal failure   DNR/NCB Palliative Care consult could be beneficial to pt as PCCM does not think that her respiratory issues will resolve completely- no family present to discuss this with yet.   Loose stools D/c miralax - appears to have resovled  Thrush Likely due to  antibiotic use for the past 10 days.  Started Nystatin today  Disposition Transfer to telemetry.   Calvert Cantor, MD [facility] Office  [phone] Pager [phone]  On-Call/Text Page:      [email or web address]      password [email or web address]

## 2011-09-27 NOTE — Progress Notes (Signed)
09/27/11 2045  Pt is being transferred to [id number]. Report has been called. Family has been made aware. Pt is alert, oriented, and has stable vital signs. Pt belongings are with pt and at bedside.   Elisha Headland RN

## 2011-09-27 NOTE — Progress Notes (Signed)
Utilization review completed.  

## 2011-09-27 NOTE — Progress Notes (Signed)
Clinical Social Worker spoke with pt, who stated "I don't feel well".  Pt stated she was looking forward to her dtr, Dawn, coming to visit at the hospital and bring lunch.  Pt consented to this CSW phoning dtr to review plan of care.  CSW reviewed PT recommendations and dtr stated she does not wish to have pt dc to a SNF and would prefer to have PT at home.  CSW notified RNCM.  This CSW signing off, please re consult if needed.   Angelia Mould, MSW, [place] [phone]

## 2011-09-28 DIAGNOSIS — J9600 Acute respiratory failure, unspecified whether with hypoxia or hypercapnia: Secondary | ICD-10-CM

## 2011-09-28 DIAGNOSIS — R7402 Elevation of levels of lactic acid dehydrogenase (LDH): Secondary | ICD-10-CM

## 2011-09-28 DIAGNOSIS — D696 Thrombocytopenia, unspecified: Secondary | ICD-10-CM

## 2011-09-28 DIAGNOSIS — R000 Tachycardia, unspecified: Secondary | ICD-10-CM

## 2011-09-28 DIAGNOSIS — R740 Nonspecific elevation of levels of transaminase and lactic acid dehydrogenase [LDH]: Secondary | ICD-10-CM

## 2011-09-28 LAB — BASIC METABOLIC PANEL
BUN: 45 mg/dL — ABNORMAL HIGH (ref 6–23)
Chloride: 103 mEq/L (ref 96–112)
GFR calc Af Amer: 46 mL/min — ABNORMAL LOW (ref >90)
Potassium: 4.4 mEq/L (ref 3.5–5.1)

## 2011-09-28 LAB — CBC
HCT: 40 % (ref 36.0–46.0)
Hemoglobin: 12.7 g/dL (ref 12.0–15.0)
MCH: 29.3 pg (ref 26.0–34.0)
MCHC: 31.8 g/dL (ref 30.0–36.0)
MCV: 92.4 fL (ref 78.0–100.0)
Platelets: 385 K/uL (ref 150–400)
RBC: 4.33 MIL/uL (ref 3.87–5.11)
RDW: 15.2 % (ref 11.5–15.5)
WBC: 25.3 K/uL — ABNORMAL HIGH (ref 4.0–10.5)

## 2011-09-28 LAB — PRO B NATRIURETIC PEPTIDE: Pro B Natriuretic peptide (BNP): 5966 pg/mL — ABNORMAL HIGH (ref 0–450)

## 2011-09-28 MED ORDER — PREDNISONE 20 MG PO TABS
40.0000 mg | ORAL_TABLET | Freq: Every day | ORAL | Status: DC
  Administered 2011-09-28 – 2011-09-30 (×3): 40 mg via ORAL
  Filled 2011-09-28 (×4): qty 2

## 2011-09-28 MED ORDER — FUROSEMIDE 40 MG PO TABS
40.0000 mg | ORAL_TABLET | Freq: Once | ORAL | Status: DC
  Filled 2011-09-28: qty 1

## 2011-09-28 MED ORDER — SPIRONOLACTONE 25 MG PO TABS
25.0000 mg | ORAL_TABLET | Freq: Two times a day (BID) | ORAL | Status: DC
  Administered 2011-09-28 – 2011-09-30 (×6): 25 mg via ORAL
  Filled 2011-09-28 (×7): qty 1

## 2011-09-28 MED ORDER — FUROSEMIDE 40 MG PO TABS
40.0000 mg | ORAL_TABLET | Freq: Every day | ORAL | Status: DC
  Administered 2011-09-28 – 2011-09-30 (×3): 40 mg via ORAL
  Filled 2011-09-28 (×3): qty 1

## 2011-09-28 MED ORDER — METRONIDAZOLE IN NACL 5-0.79 MG/ML-% IV SOLN
500.0000 mg | Freq: Three times a day (TID) | INTRAVENOUS | Status: DC
  Administered 2011-09-28 – 2011-09-29 (×3): 500 mg via INTRAVENOUS
  Filled 2011-09-28 (×5): qty 100

## 2011-09-28 NOTE — Progress Notes (Signed)
[name] is a 78 y.o. female admitted with complaints of leg swelling and shortness of breath. She is being treated for acute decompensation of diastolic chf. Appreciate cardiology/pulmonary input. I have reviewed her chart, seen and examined her at bed side in presence of her 2 daughters.  SUBJECTIVE Says she does not feel too good. She says she has had diarrhea on and off since admission, and she feels dizzy.   1. Community acquired pneumonia   2. Congestive heart failure   3. Dysthymia   4. CHF (congestive heart failure)   5. Acute on chronic renal failure   6. Acute respiratory failure with hypoxia   7. Atrial fibrillation   8. CAP (community acquired pneumonia)   9. Pulmonary infiltrate   10. Weakness generalized   11. Diastolic heart failure   12. Hypoxia     Past Medical History  Diagnosis Date  . Hypertension   . Arthritis     RT HIP  . Osteoporosis   . Renal insufficiency   . Atrial fib/flutter, transient   . Thyroid disease    Current Facility-Administered Medications  Medication Dose Route Frequency Provider Last Rate Last Dose  . acetaminophen (TYLENOL) tablet 650 mg  650 mg Oral Q4H PRN Therisa Doyne, MD   650 mg at 09/25/11 0216  . antiseptic oral rinse (BIOTENE) solution 15 mL  15 mL Mouth Rinse q12n4p Lonia Blood, MD   15 mL at 09/28/11 1200  . aspirin chewable tablet 81 mg  81 mg Oral Daily Therisa Doyne, MD   81 mg at 09/28/11 1105  . atenolol (TENORMIN) tablet 25 mg  25 mg Oral BID Lonia Blood, MD   25 mg at 09/28/11 1105  . bisacodyl (DULCOLAX) suppository 10 mg  10 mg Rectal Daily PRN Lonia Blood, MD      . chlorhexidine (PERIDEX) 0.12 % solution 15 mL  15 mL Mouth/Throat BID Lonia Blood, MD   15 mL at 09/28/11 0758  . citalopram (CELEXA) tablet 10 mg  10 mg Oral Daily Therisa Doyne, MD   10 mg at 09/28/11 1108  . digoxin (LANOXIN) tablet 0.125 mg  0.125 mg Oral Daily Lyn Records III, MD   0.125 mg at  09/28/11 1107  . enoxaparin (LOVENOX) injection 30 mg  30 mg Subcutaneous Q24H Madolyn Frieze, PHARMD   30 mg at 09/27/11 1834  . furosemide (LASIX) tablet 40 mg  40 mg Oral Daily Lyn Records III, MD   40 mg at 09/28/11 1120  . lactobacillus acidophilus (BACID) tablet 2 tablet  2 tablet Oral BID Lonia Blood, MD   2 tablet at 09/27/11 1026  . lactose free nutrition (BOOST PLUS) liquid 237 mL  237 mL Oral TID WC Calvert Cantor, MD   237 mL at 09/28/11 1100  . levothyroxine (SYNTHROID, LEVOTHROID) tablet 88 mcg  88 mcg Oral Q0600 Therisa Doyne, MD   88 mcg at 09/28/11 0650  . magnesium citrate solution 1 Bottle  1 Bottle Oral Daily PRN Lonia Blood, MD      . morphine 2 MG/ML injection 2 mg  2 mg Intravenous Q4H PRN Calvert Cantor, MD   2 mg at 09/22/11 2039  . nystatin (MYCOSTATIN) 100000 UNIT/ML suspension 1,000,000 Units  10 mL Oral QID Calvert Cantor, MD   1,000,000 Units at 09/28/11 1344  . ondansetron (ZOFRAN) injection 4 mg  4 mg Intravenous Q6H PRN Therisa Doyne, MD      .  predniSONE (DELTASONE) tablet 40 mg  40 mg Oral Q breakfast Bernadene Person, NP   40 mg at 09/28/11 1109  . senna (SENOKOT) tablet 17.2 mg  2 tablet Oral QHS Lonia Blood, MD   17.2 mg at 09/19/11 2216  . sodium chloride 0.9 % injection 10-40 mL  10-40 mL Intracatheter PRN Lonia Blood, MD   10 mL at 09/27/11 0952  . spironolactone (ALDACTONE) tablet 25 mg  25 mg Oral BID Lyn Records III, MD   25 mg at 09/28/11 1343  . DISCONTD: furosemide (LASIX) tablet 40 mg  40 mg Oral Once Bernadene Person, NP      . DISCONTD: methylPREDNISolone sodium succinate (SOLU-MEDROL) 40 mg/mL injection 40 mg  40 mg Intravenous Q12H Coralyn Helling, MD   40 mg at 09/27/11 2243  . DISCONTD: potassium chloride SA (K-DUR,KLOR-CON) CR tablet 20 mEq  20 mEq Oral BID Lonia Blood, MD   20 mEq at 09/27/11 2242  . DISCONTD: potassium chloride SA (K-DUR,KLOR-CON) CR tablet 40 mEq  40 mEq Oral BID Calvert Cantor, MD       Allergies  Allergen Reactions  . Amoxicillin Hives  . Ciprofloxacin Hives  . Polocaine (Mepivacaine Hcl) Hives  . Procaine Hives  . Promethazine Hcl Hives  . Quinidine   . Septra (Sulfamethoxazole W/Trimethoprim (Co-Trimoxazole)) Other (See Comments)    Dizziness and tremor  . Tiazac (Diltiazem Hcl) Hives  . Vicodin (Hydrocodone-Acetaminophen) Hives  . Warfarin Sodium     GI bleed   Principal Problem:  *CAP (community acquired pneumonia) Active Problems:  CHF (congestive heart failure)  Hypoxia  Abnormal LFTs  Acute respiratory failure with hypoxia  Atrial fibrillation  Diastolic heart failure  Pulmonary infiltrate  Hypothyroid  Hypokalemia  Weakness generalized  Acute on chronic renal failure   Vital signs in last 24 hours: Temp:  [97.5 F (36.4 C)-98.1 F (36.7 C)] 98.1 F (36.7 C) (05/22 1300) Pulse Rate:  [78-82] 78  (05/22 1300) Resp:  [18-23] 20  (05/22 1300) BP: (117-145)/(63-77) 142/74 mmHg (05/22 1300) SpO2:  [93 %-97 %] 97 % (05/22 1300) Weight:  [60.1 kg (132 lb 7.9 oz)-62.7 kg (138 lb 3.7 oz)] 60.1 kg (132 lb 7.9 oz) (05/22 0622) Weight change: -0.2 kg (-7.1 oz) Last BM Date: 09/28/11  Intake/Output from previous day: 05/21 0701 - 05/22 0700 In: 400 [P.O.:400] Out: 1100 [Urine:1100] Intake/Output this shift: Total I/O In: 220 [P.O.:220] Out: 450 [Urine:450]  Lab Results:  [facility] 09/28/11 0555 09/26/11 0359  WBC 25.3* 18.7*  HGB 12.7 12.7  HCT 40.0 39.6  PLT 385 373   BMET  Basename 09/28/11 0555 09/27/11 0836  NA 145 141  K 4.4 3.6  CL 103 98  CO2 33* 34*  GLUCOSE 125* 154*  BUN 45* 45*  CREATININE 1.19* 1.30*  CALCIUM 9.0 8.8    Studies/Results: Dg Chest 2 View  09/27/2011  *RADIOLOGY REPORT*  Clinical Data:  Dizziness and weakness.  CHEST - 2 VIEW  Comparison: Chest x-ray 09/26/2011.  Findings: Previously noted right upper extremity PICC is unchanged in position with tip in the superior aspect of the right  atrium. Compared to the recent prior examination, aeration has significantly improved throughout the lungs bilaterally, suggesting resolving pulmonary edema.  There continues to be some patchy interstitial and airspace opacities most pronounced in the perihilar and upper lungs, compatible with residual moderate pulmonary edema.  Superimposed acute infection is difficult to entirely exclude.  There may be small bilateral  pleural effusions (left greater than right).  Heart size is mildly enlarged (unchanged).  Mediastinal contours are distorted by patient positioning, but are similar to priors.  Atherosclerotic calcifications within the arch of the aorta.  A left-sided pacemaker in place with lead tips projecting over the expected location of the right atrial appendage and right ventricular apex. Postoperative changes of ORIF in the left proximal humerus are incompletely visualized.  IMPRESSION: 1.  Improving aeration throughout the lungs bilaterally favored to reflect resolving pulmonary edema (superimposed acute infection is difficult to entirely exclude). 2.  Small bilateral pleural effusions. 3.  Mild cardiomegaly is unchanged. 4.  Atherosclerosis.  Original Report Authenticated By: Florencia Reasons, M.D.    Medications: I have reviewed the patient's current medications.   Physical exam GENERAL- alert, apathetic. HEAD- normal atraumatic, no neck masses, normal thyroid, no jvd RESPIRATORY- Good air entry bilaterally. No rhonchi or rales. CVS- regular rate and rhythm, S1, S2 normal, no murmur, click, rub or gallop ABDOMEN- abdomen is soft without significant tenderness, masses, organomegaly or guarding NEURO- Grossly normal EXTREMITIES- extremities normal, atraumatic, no cyanosis or edema  Plan   *CAP (community acquired pneumonia)/Acute respiratory failure with hypoxia- completed course of abx. ? Cause of leukocytosis(?steroid induced demagination versus other infection). Follow c.diff PCR. *  CHF (Diastolic congestive heart failure)- good clinical improvement. Clearance of pulmonary edema on cxr. Appreciate cardiology. Defer mx to Dr Katrinka Blazing. *  Atrial fibrillation- rate controlled. Declines anticoagulation. *  Hypothyroid *  Weakness generalized- due to ongoing medical issues. Supportive care. *  Acute on chronic renal failure- element of diuresis. Monitor. dvt prophylaxis    Daphnee Preiss 09/28/2011 2:00 PM Pager: [phone].

## 2011-09-28 NOTE — Progress Notes (Signed)
Patient Name: [name] Date of Encounter: 09/28/2011    SUBJECTIVE: Weak and poor appetite.  TELEMETRY:  V paced and a fib in background: Filed Vitals:   09/27/11 1700 09/27/11 2015 09/27/11 2100 09/28/11 0622  BP: 139/63  145/69 117/77  Pulse: 80  81 82  Temp: 97.5 F (36.4 C) 97.6 F (36.4 C) 97.6 F (36.4 C) 98 F (36.7 C)  TempSrc: Oral Oral Oral Oral  Resp: 23  18 20   Height:   5\' 5"  (1.651 m)   Weight:   62.7 kg (138 lb 3.7 oz) 60.1 kg (132 lb 7.9 oz)  SpO2: 97%  95% 93%    Intake/Output Summary (Last 24 hours) at 09/28/11 0857 Last data filed at 09/28/11 0225  Gross per 24 hour  Intake    300 ml  Output    900 ml  Net   -600 ml    LABS: Basic Metabolic Panel:  Basename 09/28/11 0555 09/27/11 0836  NA 145 141  K 4.4 3.6  CL 103 98  CO2 33* 34*  GLUCOSE 125* 154*  BUN 45* 45*  CREATININE 1.19* 1.30*  CALCIUM 9.0 8.8  MG -- --  PHOS -- --   CBC:  Basename 09/28/11 0555 09/26/11 0359  WBC 25.3* 18.7*  NEUTROABS -- --  HGB 12.7 12.7  HCT 40.0 39.6  MCV 92.4 93.0  PLT 385 373   BNP (last 3 results)  Basename 09/28/11 0555 09/20/11 0500 09/17/11 1527  PROBNP 5966.0* 5485.0* 3331.0*   Radiology/Studies:  IMPRESSION:  1. Improving aeration throughout the lungs bilaterally favored to  reflect resolving pulmonary edema (superimposed acute infection is  difficult to entirely exclude).  2. Small bilateral pleural effusions.  3. Mild cardiomegaly is unchanged.  4. Atherosclerosis.  Original Report Authenticated By: Florencia Reasons, M.D.   Physical Exam: Blood pressure 117/77, pulse 82, temperature 98 F (36.7 C), temperature source Oral, resp. rate 20, height 5\' 5"  (1.651 m), weight 60.1 kg (132 lb 7.9 oz), SpO2 93.00%. Weight change: -0.2 kg (-7.1 oz)   Faint rales at the bases.  ASSESSMENT:  1. Severe acute on chronic diastolic heart failure with clinical improvement.  2. Acute on chronic kidney injury / insufficiency,  improving  3. Malnutrition, frailty, and immobility place her at high risk.  4. Atrial fib persists   Plan:  1. Add aldactone and decrease potassium suppliment  2. Consider whether chronic RV pacing is aggravating DHF. Not sure what else to do. Failed amiodarone after decreasing her dose to 100 mg daily last year.  Selinda Eon 09/28/2011, 8:57 AM

## 2011-09-28 NOTE — Progress Notes (Signed)
PICC line is not working.  IV team notified.  IV flagyl is now due to be hung at 2100.  Report passed to [place], Charity fundraiser.

## 2011-09-28 NOTE — Progress Notes (Signed)
Name: [name] MRN: [id number] DOB: 04-26-1920    LOS: 11  PULMONARY / CRITICAL CARE MEDICINE  Reason for Consult: Hypoxic Respiratory Failure Consulting MD: Mendel Ryder  Brief history 78 year old female  w/ known h/o AF, diastolic dysfxn and CRI. Presented on 5/11:  CXR demonstrated bilateral upper lobe airspace disease. She was treated w/ aggressive diuresis, supplemental oxygen and empiric antibiotic treatment for CAP. Amiodarone was stopped on 5/13. Infiltrates persisted and associated with hypoxemia, BiPAP requirement. Empiric steroids added 5/14 for either aspiration or amiodarone pneumonitis  Current Status: Not feeling well this morning.  No specific c/o.  SOB much improved.    Vital Signs: Temp:  [97.5 F (36.4 C)-98.2 F (36.8 C)] 98 F (36.7 C) (05/22 0622) Pulse Rate:  [80-86] 82  (05/22 0622) Resp:  [16-23] 20  (05/22 0622) BP: (117-158)/(63-77) 117/77 mmHg (05/22 0622) SpO2:  [93 %-98 %] 93 % (05/22 0622) Weight:  [132 lb 7.9 oz (60.1 kg)-138 lb 3.7 oz (62.7 kg)] 132 lb 7.9 oz (60.1 kg) (05/22 0622) 3L [place]    Intake/Output Summary (Last 24 hours) at 09/28/11 0825 Last data filed at 09/28/11 0225  Gross per 24 hour  Intake    300 ml  Output    900 ml  Net   -600 ml    Physical Examination: General - chronically ill appearing, NAD  HEENT - no sinus tenderness, mm dry  Cardiac - s1s2, no murmur Chest - resps even non labored, b/l rales, no wheeze Abd - soft, nontender Ext - no edema Neuro - A&O x 3   .Dg Chest 2 View  09/27/2011  *RADIOLOGY REPORT*  Clinical Data:  Dizziness and weakness.  CHEST - 2 VIEW  Comparison: Chest x-ray 09/26/2011.  Findings: Previously noted right upper extremity PICC is unchanged in position with tip in the superior aspect of the right atrium. Compared to the recent prior examination, aeration has significantly improved throughout the lungs bilaterally, suggesting resolving pulmonary edema.  There continues to be some patchy  interstitial and airspace opacities most pronounced in the perihilar and upper lungs, compatible with residual moderate pulmonary edema.  Superimposed acute infection is difficult to entirely exclude.  There may be small bilateral pleural effusions (left greater than right).  Heart size is mildly enlarged (unchanged).  Mediastinal contours are distorted by patient positioning, but are similar to priors.  Atherosclerotic calcifications within the arch of the aorta.  A left-sided pacemaker in place with lead tips projecting over the expected location of the right atrial appendage and right ventricular apex. Postoperative changes of ORIF in the left proximal humerus are incompletely visualized.  IMPRESSION: 1.  Improving aeration throughout the lungs bilaterally favored to reflect resolving pulmonary edema (superimposed acute infection is difficult to entirely exclude). 2.  Small bilateral pleural effusions. 3.  Mild cardiomegaly is unchanged. 4.  Atherosclerosis.  Original Report Authenticated By: Florencia Reasons, M.D.    Lab Results  Component Value Date   ALT 41* 09/26/2011   AST 27 09/26/2011   ALKPHOS 92 09/26/2011   BILITOT 0.4 09/26/2011    BNP (last 3 results)  Basename 09/28/11 0555 09/20/11 0500 09/17/11 1527  PROBNP 5966.0* 5485.0* 3331.0*    Lab Results  Component Value Date   ESRSEDRATE 47* 09/19/2011    ASSESSMENT AND PLAN   A: Acute Respiratory Failure in the setting of Bilateral upper lobe predominant pulmonary infiltrates. Primary Diff dx: edema (now diuresed to the point her creatinine has bumped), CAP (currently  on broad spec abx), pneumonitis (possibly amiodarone induced), aspiration w/ ALI (doubt) or some mix of any of the above.  CXR MUCH improved 5/21   P:   -change solumedrol to PO pred with slow taper  -Cont gentle diuresis as renal function, BP allows>>>give dose of lasix 40 mg PO x one 5/22 -f/u CXR 5/23 -titrate oxygen to keep SpO2 > 92% -mobilize, PT/OT   A:  diastolic heart failure P:  -bp control, diurese as renal function allows.   Lab 09/28/11 0555 09/27/11 0836 09/26/11 0359 09/24/11 0407 09/23/11 0430  CREATININE 1.19* 1.30* 1.48* 2.04* 2.39*    INFECTIOUS  Cultures: bcx2 5/11>>>negative MRSA PCR 5/11: neg Urine strep and legionella: negative  Antibiotics: azithro  (CAP) 5/11>>>5/16 Rocephin (CAP) 5/11>>>5/12 Cefepime (CAP) 5/12>>>5/13 Zosyn (progressive infiltrates)5/13>>>5/21 vanc(progressive infiltrates) 5/13>>>5/21  A:  Possible CAP or HCAP P:   -S/p 12 day course abx -monitor fevers, wbc curve off abx   Code status >> DNR   WHITEHEART,KATHRYN, NP 09/28/2011  8:25 AM Pager: [phone]  *Care during the described time interval was provided by me and/or other providers on the critical care team. I have reviewed this patient's available data, including medical history, events of note, physical examination and test results as part of my evaluation.   Attending Addendum:  I have seen the patient, discussed the issues, test results and plans with K. Whiteheart, NP. I agree with the Assessment and Plans as ammended above.   Levy Pupa, MD, PhD 09/28/2011, 3:09 PM [facility] [phone] or if no answer [phone]

## 2011-09-28 NOTE — Progress Notes (Signed)
Dr. Venetia Constable notified about stool occurrence today and results of negative c.dif.  Orders given for flagyl.  Will carry out orders and continue to monitor.

## 2011-09-28 NOTE — Progress Notes (Signed)
Physical Therapy Treatment Patient Details Name: [name] MRN: [id number] DOB: 02-07-1920 Today's Date: 09/28/2011 Time: 2440-1027 PT Time Calculation (min): 14 min  PT Assessment / Plan / Recommendation Comments on Treatment Session  Pt continues to make excellent progress with mobility. Pt's sats dropped to 73% on room air and as low as 86 on 3L O2 with activity. Will likely need home O2.  Pt should be safe to return to home if supervision is available.     Follow Up Recommendations  Supervision/Assistance - 24 hour;Home health PT    Barriers to Discharge        Equipment Recommendations       Recommendations for Other Services    Frequency Min 3X/week   Plan Discharge plan remains appropriate    Precautions / Restrictions Restrictions Weight Bearing Restrictions: No   Pertinent Vitals/Pain Pt denied pain See below of O2 sats    Mobility  Bed Mobility Bed Mobility: Not assessed Supine to Sit: Not tested (comment) Transfers Transfers: Sit to Stand;Stand to Sit Sit to Stand: 5: Supervision;From chair/3-in-1;With upper extremity assist Stand to Sit: 5: Supervision;To chair/3-in-1;With upper extremity assist;With armrests Details for Transfer Assistance: Supervision for safety Pt demonstrating safe technique.  Ambulation/Gait Ambulation/Gait Assistance: 5: Supervision Ambulation Distance (Feet): 150 Feet Assistive device: Rolling walker Ambulation/Gait Assistance Details: Suprvision for safety assist for management of O2. Pt's sats dropped to 73%  on room air and as low as 86 on 3L O2 with activity.   Gait Pattern: Decreased stride length;Step-through pattern;Trunk flexed Gait velocity: decreased Stairs: No Wheelchair Mobility Wheelchair Mobility: No    Exercises     PT Diagnosis:    PT Problem List:   PT Treatment Interventions:     PT Goals Acute Rehab PT Goals Time For Goal Achievement: 10/07/11 Pt will go Sit to Stand: with supervision PT Goal: Sit  to Stand - Progress: Met Pt will go Stand to Sit: with supervision PT Goal: Stand to Sit - Progress: Met Pt will Transfer Bed to Chair/Chair to Bed: with supervision PT Transfer Goal: Bed to Chair/Chair to Bed - Progress: Met Pt will Ambulate: with min assist;51 - 150 feet;with least restrictive assistive device PT Goal: Ambulate - Progress: Met Pt will Go Up / Down Stairs: 3-5 stairs;with supervision;with rail(s) PT Goal: Up/Down Stairs - Progress: Not met  Visit Information  Last PT Received On: 09/28/11    Subjective Data      Cognition  Overall Cognitive Status: Appears within functional limits for tasks assessed/performed Arousal/Alertness: Awake/alert Orientation Level: Appears intact for tasks assessed Behavior During Session: [facility] for tasks performed    Balance  Balance Balance Assessed: No  End of Session PT - End of Session Equipment Utilized During Treatment: Gait belt Activity Tolerance: Patient tolerated treatment well Patient left: in chair;with call bell/phone within reach Nurse Communication: Mobility status    [name] 09/28/2011, 3:40 PM [name] L. Zunaira Lamy DPT [phone]

## 2011-09-29 ENCOUNTER — Inpatient Hospital Stay (HOSPITAL_COMMUNITY): Payer: MEDICARE

## 2011-09-29 DIAGNOSIS — J9600 Acute respiratory failure, unspecified whether with hypoxia or hypercapnia: Secondary | ICD-10-CM

## 2011-09-29 DIAGNOSIS — R000 Tachycardia, unspecified: Secondary | ICD-10-CM

## 2011-09-29 DIAGNOSIS — D696 Thrombocytopenia, unspecified: Secondary | ICD-10-CM

## 2011-09-29 DIAGNOSIS — D7289 Other specified disorders of white blood cells: Secondary | ICD-10-CM

## 2011-09-29 LAB — URINALYSIS, ROUTINE W REFLEX MICROSCOPIC
Nitrite: NEGATIVE
Protein, ur: NEGATIVE mg/dL
Urobilinogen, UA: 0.2 mg/dL (ref 0.0–1.0)

## 2011-09-29 LAB — CBC
Hemoglobin: 11.6 g/dL — ABNORMAL LOW (ref 12.0–15.0)
MCHC: 32 g/dL (ref 30.0–36.0)
RDW: 15.3 % (ref 11.5–15.5)

## 2011-09-29 LAB — URINE MICROSCOPIC-ADD ON

## 2011-09-29 LAB — BASIC METABOLIC PANEL
BUN: 44 mg/dL — ABNORMAL HIGH (ref 6–23)
GFR calc Af Amer: 47 mL/min — ABNORMAL LOW (ref >90)
GFR calc non Af Amer: 41 mL/min — ABNORMAL LOW (ref >90)
Potassium: 3.5 mEq/L (ref 3.5–5.1)

## 2011-09-29 LAB — DIFFERENTIAL
Basophils Relative: 0 % (ref 0–1)
Eosinophils Absolute: 0 10*3/uL (ref 0.0–0.7)
Neutrophils Relative %: 92 % — ABNORMAL HIGH (ref 43–77)

## 2011-09-29 MED ORDER — LOPERAMIDE HCL 2 MG PO CAPS
4.0000 mg | ORAL_CAPSULE | Freq: Once | ORAL | Status: AC
  Administered 2011-09-29: 4 mg via ORAL
  Filled 2011-09-29: qty 2

## 2011-09-29 MED ORDER — SODIUM CHLORIDE 0.9 % IV SOLN
INTRAVENOUS | Status: DC | PRN
  Administered 2011-09-29: 22:00:00 via INTRAVENOUS

## 2011-09-29 MED ORDER — METRONIDAZOLE 500 MG PO TABS
500.0000 mg | ORAL_TABLET | Freq: Three times a day (TID) | ORAL | Status: DC
  Administered 2011-09-29 – 2011-09-30 (×3): 500 mg via ORAL
  Filled 2011-09-29 (×5): qty 1

## 2011-09-29 NOTE — Progress Notes (Signed)
Name: ARANDA BIHM MRN: [id number] DOB: 03-Nov-1919    LOS: 12  PULMONARY / CRITICAL CARE MEDICINE  Reason for Consult: Hypoxic Respiratory Failure Consulting MD: Mendel Ryder  Brief history 78 year old female  w/ known h/o AF, diastolic dysfxn and CRI. Presented on 5/11:  CXR demonstrated bilateral upper lobe airspace disease. She was treated w/ aggressive diuresis, supplemental oxygen and empiric antibiotic treatment for CAP. Amiodarone was stopped on 5/13. Infiltrates persisted and associated with hypoxemia, BiPAP requirement. Empiric steroids added 5/14 for either aspiration or amiodarone pneumonitis  Current Status: Dyspnea better, having diarrhea, overall weakness Poor PO intake  Vital Signs: Temp:  [97.9 F (36.6 C)-98.1 F (36.7 C)] 97.9 F (36.6 C) (05/23 0422) Pulse Rate:  [78-82] 80  (05/23 0938) Resp:  [18-20] 18  (05/23 0422) BP: (126-152)/(61-80) 145/80 mmHg (05/23 0938) SpO2:  [92 %-97 %] 97 % (05/23 0422) Weight:  [60.3 kg (132 lb 15 oz)] 60.3 kg (132 lb 15 oz) (05/23 0422) 3L [place]    Intake/Output Summary (Last 24 hours) at 09/29/11 1120 Last data filed at 09/29/11 0900  Gross per 24 hour  Intake    920 ml  Output   1225 ml  Net   -305 ml    Physical Examination: General - chronically ill appearing, NAD  HEENT - no sinus tenderness, mm dry  Cardiac - s1s2, no murmur Chest - resps even non labored, b/l rales, no wheeze Abd - soft, nontender Ext - no edema Neuro - A&O x 3   .Dg Chest 2 View  09/27/2011  *RADIOLOGY REPORT*  Clinical Data:  Dizziness and weakness.  CHEST - 2 VIEW  Comparison: Chest x-ray 09/26/2011.  Findings: Previously noted right upper extremity PICC is unchanged in position with tip in the superior aspect of the right atrium. Compared to the recent prior examination, aeration has significantly improved throughout the lungs bilaterally, suggesting resolving pulmonary edema.  There continues to be some patchy interstitial and airspace  opacities most pronounced in the perihilar and upper lungs, compatible with residual moderate pulmonary edema.  Superimposed acute infection is difficult to entirely exclude.  There may be small bilateral pleural effusions (left greater than right).  Heart size is mildly enlarged (unchanged).  Mediastinal contours are distorted by patient positioning, but are similar to priors.  Atherosclerotic calcifications within the arch of the aorta.  A left-sided pacemaker in place with lead tips projecting over the expected location of the right atrial appendage and right ventricular apex. Postoperative changes of ORIF in the left proximal humerus are incompletely visualized.  IMPRESSION: 1.  Improving aeration throughout the lungs bilaterally favored to reflect resolving pulmonary edema (superimposed acute infection is difficult to entirely exclude). 2.  Small bilateral pleural effusions. 3.  Mild cardiomegaly is unchanged. 4.  Atherosclerosis.  Original Report Authenticated By: Florencia Reasons, M.D.   Dg Chest Port 1 View  09/29/2011  *RADIOLOGY REPORT*  Clinical Data: Pulmonary infiltrates.  Shortness of breath.  PORTABLE CHEST - 1 VIEW  Comparison: 09/27/2011.  Findings: The cardiac silhouette, mediastinal and hilar contours are stable.  The pacer wires are stable.  There are persistent bilateral airspace opacities, likely infiltrates.  Small pleural effusions are suspected.  IMPRESSION: Persistent bilateral infiltrates.  Original Report Authenticated By: P. Loralie Champagne, M.D.    Lab Results  Component Value Date   ALT 41* 09/26/2011   AST 27 09/26/2011   ALKPHOS 92 09/26/2011   BILITOT 0.4 09/26/2011    BNP (last 3 results)  Basename 09/28/11 0555 09/20/11 0500 09/17/11 1527  PROBNP 5966.0* 5485.0* 3331.0*    Lab Results  Component Value Date   ESRSEDRATE 47* 09/19/2011    ASSESSMENT AND PLAN   A: Acute Respiratory Failure in the setting of Bilateral upper lobe predominant pulmonary  infiltrates. Primary Diff dx: edema (now diuresed to the point her creatinine has bumped), CAP (currently on broad spec abx), pneumonitis (possibly amiodarone induced) currently on pred, aspiration w/ ALI (doubt) or some mix of any of the above.  CXR MUCH improved 5/21   P:   -taper Prednisone 40mg  x 5 more days, then 20mg  x 1 week, 10mg  x 1 week, then stop -Cont gentle diuresis as renal function, BP allows>>> on scheduled lasix 40mg   -f/u CXR 5/23 -titrate oxygen to keep SpO2 > 92% -mobilize, PT/OT  -suspect that she will need SNF care or aggressive home health at discharge  A: diastolic heart failure P:  -bp control, diurese as renal function allows.   Lab 09/29/11 0615 09/28/11 0555 09/27/11 0836 09/26/11 0359 09/24/11 0407  CREATININE 1.15* 1.19* 1.30* 1.48* 2.04*    INFECTIOUS  Cultures: bcx2 5/11>>>negative MRSA PCR 5/11: neg Urine strep and legionella: negative  Antibiotics: azithro  (CAP) 5/11>>>5/16 Rocephin (CAP) 5/11>>>5/12 Cefepime (CAP) 5/12>>>5/13 Zosyn (progressive infiltrates)5/13>>>5/21 vanc(progressive infiltrates) 5/13>>>5/21  A:  Possible CAP or HCAP P:   -S/p 12 day course abx -monitor fevers, wbc curve off abx   Code status >> DNR  Pulmonary will sign off, please call if we can help further.   Levy Pupa, MD, PhD 09/29/2011, 11:20 AM [facility] [phone] or if no answer [phone]

## 2011-09-29 NOTE — Progress Notes (Signed)
SUBJECTIVE Feels a little better and wants to go home. Says she had 1 BM today.   1. Community acquired pneumonia   2. Congestive heart failure   3. Dysthymia   4. CHF (congestive heart failure)   5. Acute on chronic renal failure   6. Acute respiratory failure with hypoxia   7. Atrial fibrillation   8. CAP (community acquired pneumonia)   9. Pulmonary infiltrate   10. Weakness generalized   11. Diastolic heart failure   12. Hypoxia     Past Medical History  Diagnosis Date  . Hypertension   . Arthritis     RT HIP  . Osteoporosis   . Renal insufficiency   . Atrial fib/flutter, transient   . Thyroid disease    Current Facility-Administered Medications  Medication Dose Route Frequency Provider Last Rate Last Dose  . acetaminophen (TYLENOL) tablet 650 mg  650 mg Oral Q4H PRN Therisa Doyne, MD   650 mg at 09/25/11 0216  . antiseptic oral rinse (BIOTENE) solution 15 mL  15 mL Mouth Rinse q12n4p Lonia Blood, MD   15 mL at 09/29/11 1200  . aspirin chewable tablet 81 mg  81 mg Oral Daily Therisa Doyne, MD   81 mg at 09/29/11 0943  . atenolol (TENORMIN) tablet 25 mg  25 mg Oral BID Lonia Blood, MD   25 mg at 09/29/11 0939  . chlorhexidine (PERIDEX) 0.12 % solution 15 mL  15 mL Mouth/Throat BID Lonia Blood, MD   15 mL at 09/29/11 0944  . citalopram (CELEXA) tablet 10 mg  10 mg Oral Daily Therisa Doyne, MD   10 mg at 09/29/11 0939  . digoxin (LANOXIN) tablet 0.125 mg  0.125 mg Oral Daily Lyn Records III, MD   0.125 mg at 09/29/11 1610  . enoxaparin (LOVENOX) injection 30 mg  30 mg Subcutaneous Q24H Madolyn Frieze, PHARMD   30 mg at 09/28/11 1828  . furosemide (LASIX) tablet 40 mg  40 mg Oral Daily Lyn Records III, MD   40 mg at 09/29/11 0939  . lactobacillus acidophilus (BACID) tablet 2 tablet  2 tablet Oral BID Lonia Blood, MD   2 tablet at 09/29/11 [phone]  . lactose free nutrition (BOOST PLUS) liquid 237 mL  237 mL Oral TID WC Calvert Cantor, MD   237 mL at 09/29/11 0626  . levothyroxine (SYNTHROID, LEVOTHROID) tablet 88 mcg  88 mcg Oral Q0600 Therisa Doyne, MD   88 mcg at 09/29/11 0625  . loperamide (IMODIUM) capsule 4 mg  4 mg Oral Once Lecretia Buczek, MD      . magnesium citrate solution 1 Bottle  1 Bottle Oral Daily PRN Lonia Blood, MD      . metroNIDAZOLE (FLAGYL) IVPB 500 mg  500 mg Intravenous Q8H Renn Dirocco, MD   500 mg at 09/29/11 1318  . morphine 2 MG/ML injection 2 mg  2 mg Intravenous Q4H PRN Calvert Cantor, MD   2 mg at 09/22/11 2039  . nystatin (MYCOSTATIN) 100000 UNIT/ML suspension 1,000,000 Units  10 mL Oral QID Calvert Cantor, MD   1,000,000 Units at 09/29/11 1318  . ondansetron (ZOFRAN) injection 4 mg  4 mg Intravenous Q6H PRN Therisa Doyne, MD      . predniSONE (DELTASONE) tablet 40 mg  40 mg Oral Q breakfast Bernadene Person, NP   40 mg at 09/29/11 5409  . sodium chloride 0.9 % injection 10-40 mL  10-40 mL Intracatheter PRN Tinnie Gens  Silvestre Gunner, MD   10 mL at 09/27/11 0952  . spironolactone (ALDACTONE) tablet 25 mg  25 mg Oral BID Lyn Records III, MD   25 mg at 09/29/11 1610  . DISCONTD: bisacodyl (DULCOLAX) suppository 10 mg  10 mg Rectal Daily PRN Lonia Blood, MD      . DISCONTD: senna (SENOKOT) tablet 17.2 mg  2 tablet Oral QHS Lonia Blood, MD   17.2 mg at 09/19/11 2216   Allergies  Allergen Reactions  . Amoxicillin Hives  . Ciprofloxacin Hives  . Polocaine (Mepivacaine Hcl) Hives  . Procaine Hives  . Promethazine Hcl Hives  . Quinidine   . Septra (Sulfamethoxazole W/Trimethoprim (Co-Trimoxazole)) Other (See Comments)    Dizziness and tremor  . Tiazac (Diltiazem Hcl) Hives  . Vicodin (Hydrocodone-Acetaminophen) Hives  . Warfarin Sodium     GI bleed   Principal Problem:  *CAP (community acquired pneumonia) Active Problems:  CHF (congestive heart failure)  Hypoxia  Abnormal LFTs  Acute respiratory failure with hypoxia  Atrial fibrillation  Diastolic heart  failure  Pulmonary infiltrate  Hypothyroid  Hypokalemia  Weakness generalized  Acute on chronic renal failure   Vital signs in last 24 hours: Temp:  [97.9 F (36.6 C)-98 F (36.7 C)] 97.9 F (36.6 C) (05/23 0422) Pulse Rate:  [78-82] 80  (05/23 0938) Resp:  [18-20] 18  (05/23 0422) BP: (126-152)/(61-80) 145/80 mmHg (05/23 0938) SpO2:  [92 %-97 %] 97 % (05/23 0422) Weight:  [60.3 kg (132 lb 15 oz)] 60.3 kg (132 lb 15 oz) (05/23 0422) Weight change: -2.4 kg (-5 lb 4.7 oz) Last BM Date: 09/28/11  Intake/Output from previous day: 05/22 0701 - 05/23 0700 In: 900 [P.O.:700; IV Piggyback:200] Out: 1475 [Urine:1475] Intake/Output this shift: Total I/O In: 360 [P.O.:360] Out: 350 [Urine:350]  Lab Results:  [facility] 09/28/11 0555  WBC 25.3*  HGB 12.7  HCT 40.0  PLT 385   BMET  Basename 09/29/11 0615 09/28/11 0555  NA 144 145  K 3.5 4.4  CL 102 103  CO2 34* 33*  GLUCOSE 77 125*  BUN 44* 45*  CREATININE 1.15* 1.19*  CALCIUM 8.4 9.0    Studies/Results: Dg Chest 2 View  09/27/2011  *RADIOLOGY REPORT*  Clinical Data:  Dizziness and weakness.  CHEST - 2 VIEW  Comparison: Chest x-ray 09/26/2011.  Findings: Previously noted right upper extremity PICC is unchanged in position with tip in the superior aspect of the right atrium. Compared to the recent prior examination, aeration has significantly improved throughout the lungs bilaterally, suggesting resolving pulmonary edema.  There continues to be some patchy interstitial and airspace opacities most pronounced in the perihilar and upper lungs, compatible with residual moderate pulmonary edema.  Superimposed acute infection is difficult to entirely exclude.  There may be small bilateral pleural effusions (left greater than right).  Heart size is mildly enlarged (unchanged).  Mediastinal contours are distorted by patient positioning, but are similar to priors.  Atherosclerotic calcifications within the arch of the aorta.  A left-sided  pacemaker in place with lead tips projecting over the expected location of the right atrial appendage and right ventricular apex. Postoperative changes of ORIF in the left proximal humerus are incompletely visualized.  IMPRESSION: 1.  Improving aeration throughout the lungs bilaterally favored to reflect resolving pulmonary edema (superimposed acute infection is difficult to entirely exclude). 2.  Small bilateral pleural effusions. 3.  Mild cardiomegaly is unchanged. 4.  Atherosclerosis.  Original Report Authenticated By: Florencia Reasons, M.D.  Dg Chest Port 1 View  09/29/2011  *RADIOLOGY REPORT*  Clinical Data: Pulmonary infiltrates.  Shortness of breath.  PORTABLE CHEST - 1 VIEW  Comparison: 09/27/2011.  Findings: The cardiac silhouette, mediastinal and hilar contours are stable.  The pacer wires are stable.  There are persistent bilateral airspace opacities, likely infiltrates.  Small pleural effusions are suspected.  IMPRESSION: Persistent bilateral infiltrates.  Original Report Authenticated By: P. Loralie Champagne, M.D.    Medications: I have reviewed the patient's current medications.   Physical exam GENERAL- alert, sitting in recliner chair comfortably. HEAD- normal atraumatic, no neck masses, normal thyroid, no jvd RESPIRATORY- appears well, vitals normal, no respiratory distress, acyanotic, normal RR, ear and throat exam is normal, neck free of mass or lymphadenopathy, chest clear, no wheezing, crepitations, rhonchi, normal symmetric air entry CVS- regular rate and rhythm, S1, S2 normal, no murmur, click, rub or gallop ABDOMEN- abdomen is soft without significant tenderness, masses, organomegaly or guarding NEURO- Grossly normal EXTREMITIES- extremities normal, atraumatic, no cyanosis or edema  Plan  *CAP (community acquired pneumonia)/Acute respiratory failure with hypoxia- Appreciate pulmonary.  completed course of abx. ? Cause of leukocytosis(?steroid induced demagination versus  other infection). Repeat cbc today. C.diff pcr negative. Keep on flagyl for now.  * CHF (Diastolic congestive heart failure)- good clinical improvement. Clearance of pulmonary edema on cxr. Appreciate cardiology. Defer mx to Dr Katrinka Blazing.  * Atrial fibrillation- rate controlled. Declines anticoagulation.  * Hypothyroid  * Weakness generalized- due to ongoing medical issues. Supportive care.  * Acute on chronic renal failure- element of diuresis. Monitor.  dvt prophylaxis. Eager to go home. If she continues to do well, likely d/c tomorrow.     [name] 09/29/2011 2:27 PM Pager: [phone].

## 2011-09-29 NOTE — Progress Notes (Signed)
Physical Therapy Treatment and Discharge.  Patient Details Name: [name] MRN: [id number] DOB: August 10, 1919 Today's Date: 09/29/2011 Time: 7829-5621 PT Time Calculation (min): 30 min  PT Assessment / Plan / Recommendation Comments on Treatment Session  Pt has met all acute PT goals. WIll likely need home O2 to address drop in SpO2.  Will Add pt to mobility team.  No further acute PT needs.      Follow Up Recommendations  Supervision/Assistance - 24 hour;Home health PT    Barriers to Discharge        Equipment Recommendations  None recommended by PT    Recommendations for Other Services    Frequency     Plan Discharge plan remains appropriate    Precautions / Restrictions Restrictions Weight Bearing Restrictions: No   Pertinent Vitals/Pain Pt denied pain.  See treatment comments for vitals.     Mobility  Bed Mobility Bed Mobility: Rolling Left;Left Sidelying to Sit Rolling Left: 6: Modified independent (Device/Increase time);With rail Left Sidelying to Sit: 6: Modified independent (Device/Increase time);With rails Supine to Sit: Not tested (comment) Sitting - Scoot to Edge of Bed: 6: Modified independent (Device/Increase time);With rail Sit to Supine: Not Tested (comment) Details for Bed Mobility Assistance: No instruction or assistance required Transfers Transfers: Sit to Stand;Stand to Sit Sit to Stand: 6: Modified independent (Device/Increase time);From bed;From chair/3-in-1;With armrests;With upper extremity assist Stand to Sit: 6: Modified independent (Device/Increase time);To chair/3-in-1 Details for Transfer Assistance: Pt demonstrated safe technique with proper hand placement.   Ambulation/Gait Ambulation/Gait Assistance: 6: Modified independent (Device/Increase time) Ambulation Distance (Feet): 150 Feet Assistive device: Rolling walker Ambulation/Gait Assistance Details: Pt demonstrated safety with use of RW.  Good posture. SpO2 continues to drop on Room  air to mid 80s however pt is able to tolerate ambulating with no significant c/o SOB.  Gait Pattern: Within Functional Limits Stairs: No Wheelchair Mobility Wheelchair Mobility: No    Exercises     PT Diagnosis:    PT Problem List:   PT Treatment Interventions:     PT Goals Acute Rehab PT Goals PT Goal Formulation: With patient/family Time For Goal Achievement: 10/07/11 Potential to Achieve Goals: Good Pt will go Supine/Side to Sit: with modified independence PT Goal: Supine/Side to Sit - Progress: Met Pt will go Sit to Supine/Side: with modified independence PT Goal: Sit to Supine/Side - Progress: Met Pt will go Sit to Stand: with supervision PT Goal: Sit to Stand - Progress: Met Pt will go Stand to Sit: with supervision PT Goal: Stand to Sit - Progress: Met Pt will Transfer Bed to Chair/Chair to Bed: with supervision PT Transfer Goal: Bed to Chair/Chair to Bed - Progress: Met Pt will Ambulate: with min assist;51 - 150 feet;with least restrictive assistive device PT Goal: Ambulate - Progress: Met Pt will Go Up / Down Stairs: 3-5 stairs;with supervision;with rail(s) PT Goal: Up/Down Stairs - Progress: Discontinued (comment)  Visit Information  Last PT Received On: 09/29/11 Assistance Needed: +2    Subjective Data  Subjective: I just dont feel good.   Cognition  Overall Cognitive Status: Appears within functional limits for tasks assessed/performed Arousal/Alertness: Lethargic Orientation Level: Appears intact for tasks assessed Behavior During Session: [facility] for tasks performed    Balance  Balance Balance Assessed: No  End of Session PT - End of Session Equipment Utilized During Treatment: Gait belt Activity Tolerance: Patient tolerated treatment well Patient left: in chair;with call bell/phone within reach Nurse Communication: Mobility status    Gennavieve Huq 09/29/2011, 2:19 PM  Tennyson Wacha L. Gerarda Conklin DPT [phone]

## 2011-09-29 NOTE — Progress Notes (Signed)
Patient Name: [name] Date of Encounter: 09/29/2011    SUBJECTIVE:Wants to go home. Still having multiple loose stools.  TELEMETRY:  A fib with V pacing: Filed Vitals:   09/28/11 1300 09/28/11 2037 09/29/11 0422 09/29/11 0938  BP: 142/74 126/61 152/74 145/80  Pulse: 78 78 82 80  Temp: 98.1 F (36.7 C) 98 F (36.7 C) 97.9 F (36.6 C)   TempSrc:  Oral Oral   Resp: 20 20 18    Height:      Weight:   60.3 kg (132 lb 15 oz)   SpO2: 97% 92% 97%     Intake/Output Summary (Last 24 hours) at 09/29/11 0951 Last data filed at 09/29/11 0900  Gross per 24 hour  Intake   1140 ml  Output   1675 ml  Net   -535 ml    LABS: Basic Metabolic Panel:  Basename 09/29/11 0615 09/28/11 0555  NA 144 145  K 3.5 4.4  CL 102 103  CO2 34* 33*  GLUCOSE 77 125*  BUN 44* 45*  CREATININE 1.15* 1.19*  CALCIUM 8.4 9.0  MG -- --  PHOS -- --   CBC:  Basename 09/28/11 0555  WBC 25.3*  NEUTROABS --  HGB 12.7  HCT 40.0  MCV 92.4  PLT 385    Radiology/Studies:  No new data  Physical Exam: Blood pressure 145/80, pulse 80, temperature 97.9 F (36.6 C), temperature source Oral, resp. rate 18, height 5\' 5"  (1.651 m), weight 60.3 kg (132 lb 15 oz), SpO2 97.00%. Weight change: -2.4 kg (-5 lb 4.7 oz)   Clear lungs.  No murmur or gallop.  Abdomen soft  ASSESSMENT:  1. CHF, resolved and on therapy that will hopefully prevent recurrence  2. Diarrhea  3. Frailty   Plan:  1. Current therapy for CHF.  2. Continue PT  3. Home when PNA / diarrhea resolved treated.  Selinda Eon 09/29/2011, 9:51 AM

## 2011-09-30 DIAGNOSIS — D696 Thrombocytopenia, unspecified: Secondary | ICD-10-CM

## 2011-09-30 DIAGNOSIS — R000 Tachycardia, unspecified: Secondary | ICD-10-CM

## 2011-09-30 DIAGNOSIS — D7289 Other specified disorders of white blood cells: Secondary | ICD-10-CM

## 2011-09-30 DIAGNOSIS — J9600 Acute respiratory failure, unspecified whether with hypoxia or hypercapnia: Secondary | ICD-10-CM

## 2011-09-30 LAB — BASIC METABOLIC PANEL
GFR calc Af Amer: 46 mL/min — ABNORMAL LOW (ref >90)
GFR calc non Af Amer: 39 mL/min — ABNORMAL LOW (ref >90)
Potassium: 3.6 mEq/L (ref 3.5–5.1)
Sodium: 141 mEq/L (ref 135–145)

## 2011-09-30 LAB — CBC
Hemoglobin: 11.7 g/dL — ABNORMAL LOW (ref 12.0–15.0)
MCHC: 32.3 g/dL (ref 30.0–36.0)
Platelets: 279 10*3/uL (ref 150–400)
RBC: 3.96 MIL/uL (ref 3.87–5.11)

## 2011-09-30 MED ORDER — FUROSEMIDE 40 MG PO TABS: 40.0000 mg | ORAL_TABLET | Freq: Every day | ORAL | Status: DC

## 2011-09-30 MED ORDER — ATENOLOL 25 MG PO TABS: 25.0000 mg | ORAL_TABLET | Freq: Two times a day (BID) | ORAL | Status: DC

## 2011-09-30 MED ORDER — PREDNISONE 10 MG PO TABS: ORAL_TABLET | ORAL | Status: DC

## 2011-09-30 MED ORDER — SPIRONOLACTONE 25 MG PO TABS: 25.0000 mg | ORAL_TABLET | Freq: Two times a day (BID) | ORAL | Status: DC

## 2011-09-30 MED ORDER — BOOST PLUS PO LIQD: 237.0000 mL | Freq: Three times a day (TID) | ORAL | Status: DC

## 2011-09-30 MED ORDER — BACID PO TABS: 2.0000 | ORAL_TABLET | Freq: Two times a day (BID) | ORAL | Status: DC

## 2011-09-30 MED ORDER — NYSTATIN 100000 UNIT/ML MT SUSP: 10.0000 mL | Freq: Four times a day (QID) | OROMUCOSAL | Status: DC

## 2011-09-30 MED ORDER — DIGOXIN 125 MCG PO TABS: 0.1250 mg | ORAL_TABLET | Freq: Every day | ORAL | Status: DC

## 2011-09-30 MED ORDER — FAMOTIDINE 20 MG PO TABS: 20.0000 mg | ORAL_TABLET | Freq: Two times a day (BID) | ORAL | Status: DC

## 2011-09-30 NOTE — Progress Notes (Signed)
   CARE MANAGEMENT NOTE 09/30/2011  Patient:  [name], [name]   Account Number:  [id number]  Date Initiated:  09/21/2011  Documentation initiated by:  Donn Pierini  Subjective/Objective Assessment:   Pt admitted with PNA- requiring 100% NRB     Action/Plan:   PTA pt lived at home with spouse, independent with ADLs,   Anticipated DC Date:  09/26/2011   Anticipated DC Plan:  HOME W HOME HEALTH SERVICES      DC Planning Services  CM consult      Choice offered to / List presented to:  C-1 Patient   DME arranged  OXYGEN      DME agency  [facility]     [facility] arranged  HH-1 RN  HH-10 DISEASE MANAGEMENT  HH-2 PT  HH-4 NURSE'S AIDE      HH agency  [facility]   Status of service:  Completed, signed off Medicare Important Message given?   (If response is "NO", the following Medicare IM given date fields will be blank) Date Medicare IM given:   Date Additional Medicare IM given:    Discharge Disposition:  HOME W HOME HEALTH SERVICES  Per UR Regulation:  Reviewed for med. necessity/level of care/duration of stay  If discussed at Long Length of Stay Meetings, dates discussed:   09/28/2011    Comments:  PCP- Susann Givens  09/30/11 1430 Spoke with pt. and daughter about [facility] services and DME oxygen.  Pt. spouse currently has [facility] and [facility] provided him DME.  Pt. stated she wanted to use them for her HH.  TC to [facility] with [facility] to make referral for [facility] RN, [facility] aide, HH PT.  Pt. will be discharged today home with dtr.  TC to [place], with [facility] to make referral for DME oxygen continuous at 2L[employer] [place]. Tera Mater, RN, BSN [place] [phone]   09/27/11- 1700- Donn Pierini RN, BSN [phone] Pt plans to return home, plan is to arrange further private pay assistance at home. Pt will also need HH and most likely home O2. NCM to follow up with pt and family  09/21/11- 1445- Donn Pierini RN, BSN [phone] Spoke with pt and  daughters at bedside- per conversation pt lives at home with spouse- who is on Hospice Care with [facility]. Husband has an Engineer, production and hospice RN that comes through [facility]. Per daughters there are sitters at home via private pay. Pt usually uses a 4-prong cane.  Other DME in the home is mainly for the husband and includes- BSC, walker, w/c, shower bench, hospital bed, and home O2 for the spouse. Pt states that she does have medication benefits and uses mail order for her prescriptions. She will have transportation home. Pt may end up needing home O2 herself, and could use a PT eval when able to tolerate activity. MD please order PT eval when appropriate- Thanks-   NCM to follow for d/c planning.

## 2011-09-30 NOTE — Progress Notes (Signed)
Name: [name] MRN: [id number] DOB: 06-Jul-1919    LOS: 13  PULMONARY / CRITICAL CARE MEDICINE  Reason for Consult: Hypoxic Respiratory Failure Consulting MD: Mendel Ryder  Brief history 78 year old female  w/ known h/o AF, diastolic dysfxn and CRI. Presented on 5/11:  CXR demonstrated bilateral upper lobe airspace disease. She was treated w/ aggressive diuresis, supplemental oxygen and empiric antibiotic treatment for CAP. Amiodarone was stopped on 5/13. Infiltrates persisted and associated with hypoxemia, BiPAP requirement. Empiric steroids added 5/14 for either aspiration or amiodarone pneumonitis  Current Status: Has walked with assist and O2  Vital Signs: Temp:  [97.6 F (36.4 C)-98.1 F (36.7 C)] 97.6 F (36.4 C) (05/24 0444) Pulse Rate:  [76-82] 82  (05/24 0444) Resp:  [18] 18  (05/24 0444) BP: (99-137)/(59-79) 137/70 mmHg (05/24 0444) SpO2:  [92 %-97 %] 92 % (05/24 0444) Weight:  [55.7 kg (122 lb 12.7 oz)] 55.7 kg (122 lb 12.7 oz) (05/24 0444)   Intake/Output Summary (Last 24 hours) at 09/30/11 1227 Last data filed at 09/30/11 1100  Gross per 24 hour  Intake    600 ml  Output   1675 ml  Net  -1075 ml    Physical Examination: General - chronically ill appearing, NAD  HEENT - no sinus tenderness, mm dry  Cardiac - s1s2, no murmur Chest - resps even non labored, b/l rales, no wheeze Abd - soft, nontender Ext - no edema Neuro - A&O x 3   .Dg Chest Port 1 View  09/29/2011  *RADIOLOGY REPORT*  Clinical Data: Pulmonary infiltrates.  Shortness of breath.  PORTABLE CHEST - 1 VIEW  Comparison: 09/27/2011.  Findings: The cardiac silhouette, mediastinal and hilar contours are stable.  The pacer wires are stable.  There are persistent bilateral airspace opacities, likely infiltrates.  Small pleural effusions are suspected.  IMPRESSION: Persistent bilateral infiltrates.  Original Report Authenticated By: P. Loralie Champagne, M.D.    Lab Results  Component Value Date   ALT 41* 09/26/2011   AST 27 09/26/2011   ALKPHOS 92 09/26/2011   BILITOT 0.4 09/26/2011    BNP (last 3 results)  Basename 09/28/11 0555 09/20/11 0500 09/17/11 1527  PROBNP 5966.0* 5485.0* 3331.0*    Lab Results  Component Value Date   ESRSEDRATE 47* 09/19/2011    ASSESSMENT AND PLAN   A: Acute Respiratory Failure in the setting of Bilateral upper lobe predominant pulmonary infiltrates. Primary Diff dx: edema (now diuresed to the point her creatinine has bumped), CAP (currently on broad spec abx), pneumonitis (possibly amiodarone induced) currently on pred, aspiration w/ ALI (doubt) or some mix of any of the above.  CXR MUCH improved 5/21   P:   -taper Prednisone 40mg  x 4 more days, then 20mg  x 1 week, 10mg  x 1 week, then stop -Cont gentle diuresis as renal function, BP allows>>> on scheduled lasix 40mg   -titrate oxygen to keep SpO2 > 92% -mobilize, PT/OT  -suspect that she will need SNF care or aggressive home health at discharge -will need O2 titration for d/c home + home O2  A: diastolic heart failure P:  -bp control, diurese as renal function allows.   Lab 09/30/11 0540 09/29/11 0615 09/28/11 0555 09/27/11 0836 09/26/11 0359  CREATININE 1.19* 1.15* 1.19* 1.30* 1.48*    INFECTIOUS  Cultures: bcx2 5/11>>>negative MRSA PCR 5/11: neg Urine strep and legionella: negative  Antibiotics: azithro  (CAP) 5/11>>>5/16 Rocephin (CAP) 5/11>>>5/12 Cefepime (CAP) 5/12>>>5/13 Zosyn (progressive infiltrates)5/13>>>5/21 vanc(progressive infiltrates) 5/13>>>5/21  A:  Possible  CAP or HCAP P:   -S/p 12 day course abx -monitor fevers, wbc curve off abx   Code status >> DNR  Pulmonary will sign off, please call if we can help further.   Levy Pupa, MD, PhD 09/30/2011, 12:27 PM [facility] [phone] or if no answer [phone]

## 2011-09-30 NOTE — Discharge Summary (Signed)
DISCHARGE SUMMARY  [name]  MR#: [id number]  DOB:November 27, 1919  Date of Admission: 09/17/2011 Date of Discharge: 09/30/2011  Attending Physician:[name]  Patient's WUJ:WJXBJYN,WGNF Danielle Most, MD, MD  Consults:Treatment Team:  Danielle Noe, MD  Discharge Diagnoses: Present on Admission:  .CAP (community acquired pneumonia) .CHF (congestive heart failure) .Hypoxia .Abnormal LFTs .Acute respiratory failure with hypoxia .Atrial fibrillation .Diastolic heart failure .Pulmonary infiltrate .Hypothyroid .Hypokalemia .Weakness generalized .Acute on chronic renal failure  Hospital Course: [name] was admitted on 09/17/11 with SOB and leg swelling. She was found to have PNA/Acute decompensation of diastolic CHF, EF 62-13%. There was concern for amiodarone toxicity, hence amiodarone discontinued. Cardiology and pulmonary have closely followed patient. She has completed course fo broad spectrum abx, and is now on lasix and sprinolactone for the diastolic CHF. Her renal function deteriorated somewhat at one point, creatinine 2.39 max on 09/23/11. This prompted retreat on diuretics. The renal function is now trending towards baseline bun/cr 37/1.19 today. [name] has had some diarrhea on and off, now better. Stool c.diff PCR was negative. I had started her on flagyl due to the high suspicion for c.diff, but I will hold the flagyl as the PCR was negative. [name] has a leukocytosis of [id number], which seems to be due to demagination from steroids(which are being tapered off slowly). If she developed fever, I would err on the side of treating for presumptive c.diff. Patient would rather d/c home to her supportive family. Her husband is on hospice and family feels [name] can get round the clock care at home.   Medication List  As of 09/30/2011  1:05 PM   STOP taking these medications         amiodarone 200 MG tablet      amLODipine 5 MG tablet         TAKE these  medications         aspirin 81 MG tablet   Take 81 mg by mouth daily.      atenolol 25 MG tablet   Commonly known as: TENORMIN   Take 1 tablet (25 mg total) by mouth 2 (two) times daily.      citalopram 10 MG tablet   Commonly known as: CELEXA   Take 1 tablet (10 mg total) by mouth daily.      digoxin 0.125 MG tablet   Commonly known as: LANOXIN   Take 1 tablet (0.125 mg total) by mouth daily.      famotidine 20 MG tablet   Commonly known as: PEPCID   Take 1 tablet (20 mg total) by mouth 2 (two) times daily.      furosemide 40 MG tablet   Commonly known as: LASIX   Take 1 tablet (40 mg total) by mouth daily.      lactobacillus acidophilus Tabs   Take 2 tablets by mouth 2 (two) times daily.      lactose free nutrition Liqd   Take 237 mLs by mouth 3 (three) times daily with meals.      levothyroxine 88 MCG tablet   Commonly known as: SYNTHROID, LEVOTHROID   Take 1 tablet (88 mcg total) by mouth daily.      mulitivitamin with minerals Tabs   Take 1 tablet by mouth daily.      nystatin 100000 UNIT/ML suspension   Commonly known as: MYCOSTATIN   Take 10 mLs (1,000,000 Units total) by mouth 4 (four) times daily.      predniSONE 10 MG tablet  Commonly known as: DELTASONE   Take 4 tablets daily for 2 days, then 3 tablets daily for 3 days then 2 tablets daily for 3 days, then 1 tablet daily for 2 days.      spironolactone 25 MG tablet   Commonly known as: ALDACTONE   Take 1 tablet (25 mg total) by mouth 2 (two) times daily.      Vitamin D 2000 UNITS Caps   Take 2,000 Units by mouth.             Day of Discharge BP 137/70  Pulse 82  Temp(Src) 97.6 F (36.4 C) (Oral)  Resp 18  Ht 5\' 5"  (1.651 m)  Wt 55.7 kg (122 lb 12.7 oz)  BMI 20.43 kg/m2  SpO2 92%  Physical Exam: Reclining comfortably in bed. Good air entry bilaterally.  Results for orders placed during the hospital encounter of 09/17/11 (from the past 24 hour(s))  CBC     Status: Abnormal    Collection Time   09/29/11  3:55 PM      Component Value Range   WBC 19.6 (*) 4.0 - 10.5 (K/uL)   RBC 3.96  3.87 - 5.11 (MIL/uL)   Hemoglobin 11.6 (*) 12.0 - 15.0 (g/dL)   HCT 16.1  09.6 - 04.5 (%)   MCV 91.7  78.0 - 100.0 (fL)   MCH 29.3  26.0 - 34.0 (pg)   MCHC 32.0  30.0 - 36.0 (g/dL)   RDW 40.9  81.1 - 91.4 (%)   Platelets 321  150 - 400 (K/uL)  DIFFERENTIAL     Status: Abnormal   Collection Time   09/29/11  3:55 PM      Component Value Range   Neutrophils Relative 92 (*) 43 - 77 (%)   Neutro Abs 18.1 (*) 1.7 - 7.7 (K/uL)   Lymphocytes Relative 5 (*) 12 - 46 (%)   Lymphs Abs 1.1  0.7 - 4.0 (K/uL)   Monocytes Relative 2 (*) 3 - 12 (%)   Monocytes Absolute 0.5  0.1 - 1.0 (K/uL)   Eosinophils Relative 0  0 - 5 (%)   Eosinophils Absolute 0.0  0.0 - 0.7 (K/uL)   Basophils Relative 0  0 - 1 (%)   Basophils Absolute 0.0  0.0 - 0.1 (K/uL)  URINALYSIS, ROUTINE W REFLEX MICROSCOPIC     Status: Abnormal   Collection Time   09/29/11  5:12 PM      Component Value Range   Color, Urine YELLOW  YELLOW    APPearance CLEAR  CLEAR    Specific Gravity, Urine 1.015  1.005 - 1.030    pH 6.0  5.0 - 8.0    Glucose, UA NEGATIVE  NEGATIVE (mg/dL)   Hgb urine dipstick TRACE (*) NEGATIVE    Bilirubin Urine NEGATIVE  NEGATIVE    Ketones, ur NEGATIVE  NEGATIVE (mg/dL)   Protein, ur NEGATIVE  NEGATIVE (mg/dL)   Urobilinogen, UA 0.2  0.0 - 1.0 (mg/dL)   Nitrite NEGATIVE  NEGATIVE    Leukocytes, UA SMALL (*) NEGATIVE   URINE MICROSCOPIC-ADD ON     Status: Abnormal   Collection Time   09/29/11  5:12 PM      Component Value Range   Squamous Epithelial / LPF FEW (*) RARE    WBC, UA 3-6  <3 (WBC/hpf)   RBC / HPF 0-2  <3 (RBC/hpf)   Bacteria, UA RARE  RARE    Casts HYALINE CASTS (*) NEGATIVE    Urine-Other MUCOUS PRESENT  BASIC METABOLIC PANEL     Status: Abnormal   Collection Time   09/30/11  5:40 AM      Component Value Range   Sodium 141  135 - 145 (mEq/L)   Potassium 3.6  3.5 - 5.1 (mEq/L)    Chloride 103  96 - 112 (mEq/L)   CO2 33 (*) 19 - 32 (mEq/L)   Glucose, Bld 76  70 - 99 (mg/dL)   BUN 37 (*) 6 - 23 (mg/dL)   Creatinine, Ser 6.04 (*) 0.50 - 1.10 (mg/dL)   Calcium 8.0 (*) 8.4 - 10.5 (mg/dL)   GFR calc non Af Amer 39 (*) >90 (mL/min)   GFR calc Af Amer 46 (*) >90 (mL/min)  CBC     Status: Abnormal   Collection Time   09/30/11  5:40 AM      Component Value Range   WBC 18.7 (*) 4.0 - 10.5 (K/uL)   RBC 3.96  3.87 - 5.11 (MIL/uL)   Hemoglobin 11.7 (*) 12.0 - 15.0 (g/dL)   HCT 54.0  98.1 - 19.1 (%)   MCV 91.4  78.0 - 100.0 (fL)   MCH 29.5  26.0 - 34.0 (pg)   MCHC 32.3  30.0 - 36.0 (g/dL)   RDW 47.8  29.5 - 62.1 (%)   Platelets 279  150 - 400 (K/uL)    Disposition: home with [facility].   Follow-up Appts: Discharge Orders    Future Orders Please Complete By Expires   Diet - low sodium heart healthy      Increase activity slowly           Tests Needing Follow-up: CBC/BMP.  Time spent in discharge (includes decision making & examination of pt): 42 minutes  Signed: Jaymian Bogart 09/30/2011, 1:05 PM

## 2011-10-10 ENCOUNTER — Encounter: Payer: Self-pay | Admitting: Family Medicine

## 2011-10-10 ENCOUNTER — Ambulatory Visit (INDEPENDENT_AMBULATORY_CARE_PROVIDER_SITE_OTHER): Payer: MEDICARE | Admitting: Family Medicine

## 2011-10-10 VITALS — BP 90/60 | HR 81 | Wt 113.0 lb

## 2011-10-10 DIAGNOSIS — R82998 Other abnormal findings in urine: Secondary | ICD-10-CM

## 2011-10-10 DIAGNOSIS — I509 Heart failure, unspecified: Secondary | ICD-10-CM

## 2011-10-10 DIAGNOSIS — R634 Abnormal weight loss: Secondary | ICD-10-CM

## 2011-10-10 DIAGNOSIS — R8290 Unspecified abnormal findings in urine: Secondary | ICD-10-CM

## 2011-10-10 LAB — POCT URINALYSIS DIPSTICK
Ketones, UA: NEGATIVE
Nitrite, UA: NEGATIVE
Urobilinogen, UA: NEGATIVE
pH, UA: 7

## 2011-10-10 LAB — CBC WITH DIFFERENTIAL/PLATELET
HCT: 43.2 % (ref 36.0–46.0)
Hemoglobin: 13.5 g/dL (ref 12.0–15.0)
Lymphs Abs: 0.7 10*3/uL (ref 0.7–4.0)
MCH: 28.7 pg (ref 26.0–34.0)
MCHC: 31.2 g/dL (ref 30.0–36.0)
MCV: 91.9 fL (ref 78.0–100.0)
Monocytes Absolute: 0.7 10*3/uL (ref 0.1–1.0)
Monocytes Relative: 4 % (ref 3–12)
RBC: 4.7 MIL/uL (ref 3.87–5.11)

## 2011-10-10 LAB — BASIC METABOLIC PANEL
CO2: 23 mEq/L (ref 19–32)
Chloride: 103 mEq/L (ref 96–112)
Potassium: 5.1 mEq/L (ref 3.5–5.3)

## 2011-10-10 NOTE — Progress Notes (Signed)
  Subjective:    Patient ID: [name], female    DOB: January 13, 1920, 78 y.o.   MRN: [id number]  HPI He is here for posthospitalization followup. She was admitted and treated for pneumonia and possible toxicity from one of her cardiac medications. The medical record was reviewed. In the hospital she also had difficulty with diarrhea and a differential was considered but testing was negative. The diarrhea has been handled fairly well with Imodium although a milkshake the other day did cause difficulty with diarrhea. She has stayed on her Spironolactone and Lasix. Review of her weight does show a significant weight loss.   Review of Systems     Objective:   Physical Exam Alert but lethargic. Cardiac exam shows regular rhythm without murmurs gallops. Lungs are clear to auscultation. Extremity exam shows no edema.       Assessment & Plan:   1. CHF (congestive heart failure)  Basic Metabolic Panel, CBC with Differential  2. Weight loss  Basic Metabolic Panel, CBC with Differential  3. Abnormal urine  POCT Urinalysis Dipstick, Urine culture   I will hold off on any antibiotics for the urine especially with the diarrhea and possible C. difficile issue. The blood work came back showing creatinine of 1.8. White count is still elevated but less than on the last visit. Apparently she is on her last dose of prednisone. I will have him hold the fluid pills for the next several days and followup with Dr. Katrinka Blazing. Lab work will be sent to him.

## 2011-10-14 ENCOUNTER — Other Ambulatory Visit: Payer: Self-pay

## 2011-10-14 MED ORDER — SULFAMETHOXAZOLE-TRIMETHOPRIM 800-160 MG PO TABS: 1.0000 | ORAL_TABLET | Freq: Two times a day (BID) | ORAL | Status: AC

## 2011-10-18 ENCOUNTER — Other Ambulatory Visit: Payer: Self-pay

## 2011-10-18 LAB — URINE CULTURE

## 2011-10-18 MED ORDER — NITROFURANTOIN MONOHYD MACRO 100 MG PO CAPS: 100.0000 mg | ORAL_CAPSULE | Freq: Every day | ORAL | Status: AC

## 2011-10-18 NOTE — Telephone Encounter (Signed)
Sent in macrobid per jcl daughter informed

## 2011-10-27 ENCOUNTER — Other Ambulatory Visit (HOSPITAL_COMMUNITY): Payer: Self-pay | Admitting: Internal Medicine

## 2011-11-04 ENCOUNTER — Other Ambulatory Visit (INDEPENDENT_AMBULATORY_CARE_PROVIDER_SITE_OTHER): Payer: MEDICARE

## 2011-11-04 ENCOUNTER — Telehealth: Payer: Self-pay | Admitting: Family Medicine

## 2011-11-04 DIAGNOSIS — N390 Urinary tract infection, site not specified: Secondary | ICD-10-CM

## 2011-11-04 LAB — POCT URINALYSIS DIPSTICK
Bilirubin, UA: NEGATIVE
Blood, UA: NEGATIVE
Glucose, UA: NEGATIVE
Ketones, UA: NEGATIVE
Nitrite, UA: NEGATIVE

## 2011-11-04 NOTE — Telephone Encounter (Signed)
Can try tylenol for pain, but looking over the chart, needs eval either here or urgent care for example

## 2011-11-04 NOTE — Telephone Encounter (Signed)
Received call from [facility], Patient's caregiver called c/o R shoulder and r side of back pain, diarrhea  Advised [facility], pt would need to be evaluated, advised [name] can see patient at 3:45, she will call caregiver back and see if they can get her in and call us back

## 2011-11-04 NOTE — Telephone Encounter (Signed)
The patients caregiver was notified of giving the patient tylenolo to help with pain of not she will need to be seen. CLS

## 2012-01-23 ENCOUNTER — Other Ambulatory Visit: Payer: Self-pay | Admitting: Family Medicine

## 2012-01-23 NOTE — Telephone Encounter (Signed)
IS THIS OK 

## 2012-01-31 ENCOUNTER — Other Ambulatory Visit: Payer: Self-pay | Admitting: Medical

## 2012-01-31 ENCOUNTER — Other Ambulatory Visit (INDEPENDENT_AMBULATORY_CARE_PROVIDER_SITE_OTHER): Payer: MEDICARE

## 2012-01-31 ENCOUNTER — Telehealth: Payer: Self-pay | Admitting: Family Medicine

## 2012-01-31 DIAGNOSIS — R300 Dysuria: Secondary | ICD-10-CM

## 2012-01-31 LAB — POCT URINALYSIS DIPSTICK
Bilirubin, UA: NEGATIVE
Glucose, UA: NEGATIVE
Nitrite, UA: NEGATIVE
Spec Grav, UA: <=1.005
Urobilinogen, UA: NEGATIVE

## 2012-01-31 MED ORDER — NITROFURANTOIN MONOHYD MACRO 100 MG PO CAPS: 100.0000 mg | ORAL_CAPSULE | Freq: Every day | ORAL | Status: DC

## 2012-01-31 NOTE — Telephone Encounter (Signed)
Yes that is fine.  Bring UA by, but if it has been sitting room temp, then we need fresh specimen

## 2012-01-31 NOTE — Telephone Encounter (Signed)
Message copied by Janeice Robinson on Tue Jan 31, 2012  4:42 PM ------      Message from: Jac Canavan      Created: Tue Jan 31, 2012  4:12 PM       Urine somewhat suggestive or UTI.  Given the symptoms called in, Macrobid 100mg  once daily sent to pharmacy.  This is what she used last UTI.              Have her repeat urinalysis (morning fresh sample) in 2wk.  Recheck if not improving though in the short term.

## 2012-01-31 NOTE — Telephone Encounter (Signed)
PATIENTS DAUGHTER WAS MADE AWARE THAT WE SENT IN A RX FOR MACROBID TO HER PHARMACY AND TO F/U IN 2 WEEKS. CLS

## 2012-02-03 ENCOUNTER — Telehealth: Payer: Self-pay | Admitting: Family Medicine

## 2012-02-06 ENCOUNTER — Encounter: Payer: Self-pay | Admitting: Family Medicine

## 2012-02-06 ENCOUNTER — Ambulatory Visit (INDEPENDENT_AMBULATORY_CARE_PROVIDER_SITE_OTHER): Payer: MEDICARE | Admitting: Family Medicine

## 2012-02-06 VITALS — BP 130/70 | HR 76 | Wt 127.0 lb

## 2012-02-06 DIAGNOSIS — Z23 Encounter for immunization: Secondary | ICD-10-CM

## 2012-02-06 DIAGNOSIS — R531 Weakness: Secondary | ICD-10-CM

## 2012-02-06 DIAGNOSIS — I5030 Unspecified diastolic (congestive) heart failure: Secondary | ICD-10-CM

## 2012-02-06 DIAGNOSIS — M1990 Unspecified osteoarthritis, unspecified site: Secondary | ICD-10-CM | POA: Insufficient documentation

## 2012-02-06 DIAGNOSIS — R5381 Other malaise: Secondary | ICD-10-CM

## 2012-02-06 DIAGNOSIS — M129 Arthropathy, unspecified: Secondary | ICD-10-CM

## 2012-02-06 DIAGNOSIS — E039 Hypothyroidism, unspecified: Secondary | ICD-10-CM

## 2012-02-06 DIAGNOSIS — I4891 Unspecified atrial fibrillation: Secondary | ICD-10-CM

## 2012-02-06 DIAGNOSIS — R5383 Other fatigue: Secondary | ICD-10-CM

## 2012-02-06 MED ORDER — INFLUENZA VIRUS VACC SPLIT PF IM SUSP: 0.5000 mL | Freq: Once | INTRAMUSCULAR | Status: DC

## 2012-02-06 NOTE — Progress Notes (Signed)
  Subjective:    Patient ID: [name], female    DOB: 1920-03-09, 78 y.o.   MRN: [id number]  HPI  she is here for general checkup. Her husband died in January 03, 2023. She is living at home and has been getting help with her ADLs. She does use a walker at home as well as a 4 pronged cane while out. Recently she has had difficulty with UTI and is presently on medication for this. She gets regular followup on her atrial fibrillation. Her blood work was reviewed and is up to date. She does have difficulty with generalized weakness and does need help with Gen. tasks around the house.   Review of Systems     Objective:   Physical Exam Alert and in no distress otherwise not examined       Assessment & Plan:   1. Weakness generalized    2. Diastolic heart failure    3. Atrial fibrillation    4. Hypothyroid    5. Arthritis    6. Need for prophylactic vaccination and inoculation against influenza  influenza  inactive virus vaccine (FLUZONE/FLUARIX) injection 0.5 mL   I had a long discussion with her concerning her overall health. She seems to be doing fairly well but does need help with daily activities. Also discussed advanced directive as well as gave information to her daughter concerning MOST. We also discussed assisted living although at this time she is not interested. Appropriate paperwork was filled out. Over 30 minutes spent discussing all these issues with them.

## 2012-02-06 NOTE — Telephone Encounter (Signed)
DONE

## 2012-02-06 NOTE — Patient Instructions (Addendum)
If there is any question about the urine bring a specimen by and we'll check it. 2 Tylenol morning and 2 of them at night to see if that'll help your throat You must go to bereavement counseling through Hospice

## 2012-02-10 ENCOUNTER — Telehealth: Payer: Self-pay | Admitting: Family Medicine

## 2012-02-10 ENCOUNTER — Ambulatory Visit (INDEPENDENT_AMBULATORY_CARE_PROVIDER_SITE_OTHER): Payer: MEDICARE | Admitting: Family Medicine

## 2012-02-10 ENCOUNTER — Encounter: Payer: Self-pay | Admitting: Family Medicine

## 2012-02-10 VITALS — BP 132/80 | Temp 97.5°F | Wt 127.0 lb

## 2012-02-10 DIAGNOSIS — N390 Urinary tract infection, site not specified: Secondary | ICD-10-CM

## 2012-02-10 DIAGNOSIS — R319 Hematuria, unspecified: Secondary | ICD-10-CM

## 2012-02-10 LAB — POCT URINALYSIS DIPSTICK
Bilirubin, UA: NEGATIVE
Glucose, UA: NEGATIVE
Ketones, UA: NEGATIVE
Spec Grav, UA: 1.02
Urobilinogen, UA: NEGATIVE

## 2012-02-10 MED ORDER — SULFAMETHOXAZOLE-TRIMETHOPRIM 800-160 MG PO TABS: 1.0000 | ORAL_TABLET | Freq: Two times a day (BID) | ORAL | Status: DC

## 2012-02-10 NOTE — Patient Instructions (Signed)
Take all the antibiotic and return here in 2 weeks for recheck

## 2012-02-10 NOTE — Telephone Encounter (Signed)
CALLED INTO PHARMACY

## 2012-02-10 NOTE — Telephone Encounter (Signed)
LM

## 2012-02-10 NOTE — Progress Notes (Signed)
  Subjective:    Patient ID: [name], female    DOB: November 16, 1919, 78 y.o.   MRN: [id number]  HPI She is here for further evaluation of UTI. Apparently there is question of hematuria. Review of her record indicates she was treated with Macrobid following completion of urine culture. She recently finished antibiotics and continues to have difficulty.   Review of Systems     Objective:   Physical Exam Alert and in no distress. Microscopic exam showed multiple white cells with bacteria       Assessment & Plan:   1. Hematuria  POCT Urinalysis Dipstick, sulfamethoxazole-trimethoprim (BACTRIM DS,SEPTRA DS) 800-160 MG per tablet  2. UTI (lower urinary tract infection)  sulfamethoxazole-trimethoprim (BACTRIM DS,SEPTRA DS) 800-160 MG per tablet   she has had difficulty with Septra in the past but I explained that under the circumstances this would be a good alternative. She is to recheck here in 2 weeks.

## 2012-02-24 ENCOUNTER — Ambulatory Visit (INDEPENDENT_AMBULATORY_CARE_PROVIDER_SITE_OTHER): Payer: MEDICARE | Admitting: Family Medicine

## 2012-02-24 ENCOUNTER — Encounter: Payer: Self-pay | Admitting: Family Medicine

## 2012-02-24 VITALS — BP 120/80 | HR 84

## 2012-02-24 DIAGNOSIS — M25519 Pain in unspecified shoulder: Secondary | ICD-10-CM

## 2012-02-24 DIAGNOSIS — M25512 Pain in left shoulder: Secondary | ICD-10-CM

## 2012-02-24 DIAGNOSIS — F329 Major depressive disorder, single episode, unspecified: Secondary | ICD-10-CM

## 2012-02-24 DIAGNOSIS — R1030 Lower abdominal pain, unspecified: Secondary | ICD-10-CM

## 2012-02-24 DIAGNOSIS — R319 Hematuria, unspecified: Secondary | ICD-10-CM

## 2012-02-24 DIAGNOSIS — R109 Unspecified abdominal pain: Secondary | ICD-10-CM

## 2012-02-24 DIAGNOSIS — F3289 Other specified depressive episodes: Secondary | ICD-10-CM

## 2012-02-24 LAB — POCT URINALYSIS DIPSTICK
Bilirubin, UA: NEGATIVE
Glucose, UA: NEGATIVE
Ketones, UA: NEGATIVE
Protein, UA: NEGATIVE

## 2012-02-24 MED ORDER — CITALOPRAM HYDROBROMIDE 20 MG PO TABS: 20.0000 mg | ORAL_TABLET | Freq: Every day | ORAL | Status: DC

## 2012-02-24 NOTE — Progress Notes (Signed)
  Subjective:    Patient ID: [name], female    DOB: 11/10/1919, 78 y.o.   MRN: [id number]  HPI She is here for a recheck on her UTI. She still complains of intermittent lower abdominal discomfort but no urgency, incontinence. She in general complains of just not feeling well. She also is had difficulty over the last several weeks with left shoulder pain. She has a previous history of fracture with surgery.   Review of Systems     Objective:   Physical Exam Alert and in no distress with a very flat affect. Abdominal exam shows no masses with minimal tenderness in the lower abdomen with palpation. Pain on motion of the left shoulder in any direction.       Assessment & Plan:   1. Hematuria  POCT Urinalysis Dipstick  2. Left shoulder pain  Ambulatory referral to Orthopedic Surgery  3. Lower abdominal pain    4. Depression     I will increase her Celexa to 20 mg. This was discussed with her daughter. No further therapy at this time for the lower abdominal discomfort.

## 2012-02-24 NOTE — Progress Notes (Signed)
Pt has appt at [facility]. Oct 28 at 2 pm with Dr.Supple pt aware

## 2012-03-02 ENCOUNTER — Telehealth: Payer: Self-pay | Admitting: Internal Medicine

## 2012-03-02 MED ORDER — LEVOTHYROXINE SODIUM 88 MCG PO TABS: 88.0000 ug | ORAL_TABLET | Freq: Every day | ORAL | Status: DC

## 2012-03-02 NOTE — Telephone Encounter (Signed)
PATIENT RX WAS SENT TO THE PHARMACY. CLS

## 2012-03-23 ENCOUNTER — Telehealth: Payer: Self-pay | Admitting: Internal Medicine

## 2012-03-23 ENCOUNTER — Other Ambulatory Visit (INDEPENDENT_AMBULATORY_CARE_PROVIDER_SITE_OTHER): Payer: MEDICARE

## 2012-03-23 DIAGNOSIS — R350 Frequency of micturition: Secondary | ICD-10-CM

## 2012-03-23 LAB — POCT URINALYSIS DIPSTICK
Blood, UA: NEGATIVE
Glucose, UA: NEGATIVE
Nitrite, UA: NEGATIVE
Spec Grav, UA: 1.01
Urobilinogen, UA: NEGATIVE
pH, UA: 5

## 2012-03-23 NOTE — Telephone Encounter (Signed)
If an antibotic is needed send to [facility] [address]

## 2012-03-23 NOTE — Telephone Encounter (Signed)
Go ahead and have her drop off a urine specimen

## 2012-03-23 NOTE — Telephone Encounter (Signed)
judy the caregiver called stating that illene is not feeling well and wanted to know if they could come by and drop off a urine sample. judy states when she wasnt feeling good the last time that she had a uti. call judy and let her know if she can bring her in

## 2012-03-26 ENCOUNTER — Telehealth: Payer: Self-pay

## 2012-03-26 NOTE — Telephone Encounter (Signed)
I think her daughter is on the right track. Let's see what Hospice can do to help her

## 2012-03-26 NOTE — Telephone Encounter (Signed)
Called [facility] and informed her Dr.Lalonde said she is on right track lets see what hospice says dawn agreed

## 2012-03-26 NOTE — Telephone Encounter (Signed)
[name] pt daughter called and asked for your advise pt u/a came back normal and she thinks it is grief from losing husband in Aug. She has called hospice to come out for the grief counseling is there any need for pt to be seen or just to continue with the counseling please advise

## 2012-04-13 ENCOUNTER — Encounter: Payer: Self-pay | Admitting: Medical

## 2012-04-13 ENCOUNTER — Ambulatory Visit (INDEPENDENT_AMBULATORY_CARE_PROVIDER_SITE_OTHER): Payer: MEDICARE | Admitting: Medical

## 2012-04-13 ENCOUNTER — Other Ambulatory Visit: Payer: Self-pay | Admitting: Medical

## 2012-04-13 VITALS — BP 110/70 | HR 86 | Temp 98.4°F | Resp 14 | Wt 136.0 lb

## 2012-04-13 DIAGNOSIS — F329 Major depressive disorder, single episode, unspecified: Secondary | ICD-10-CM

## 2012-04-13 DIAGNOSIS — E039 Hypothyroidism, unspecified: Secondary | ICD-10-CM

## 2012-04-13 DIAGNOSIS — R5381 Other malaise: Secondary | ICD-10-CM

## 2012-04-13 DIAGNOSIS — N289 Disorder of kidney and ureter, unspecified: Secondary | ICD-10-CM

## 2012-04-13 DIAGNOSIS — R531 Weakness: Secondary | ICD-10-CM

## 2012-04-13 LAB — POCT URINALYSIS DIPSTICK
Ketones, UA: NEGATIVE
Spec Grav, UA: 1.015
Urobilinogen, UA: NEGATIVE
pH, UA: 5

## 2012-04-13 LAB — CBC WITH DIFFERENTIAL/PLATELET
Basophils Absolute: 0 10*3/uL (ref 0.0–0.1)
Basophils Relative: 0 % (ref 0–1)
Eosinophils Relative: 1 % (ref 0–5)
HCT: 37.8 % (ref 36.0–46.0)
MCHC: 32.8 g/dL (ref 30.0–36.0)
Monocytes Absolute: 0.8 10*3/uL (ref 0.1–1.0)
Neutro Abs: 6 10*3/uL (ref 1.7–7.7)
Platelets: 283 10*3/uL (ref 150–400)
RDW: 14.7 % (ref 11.5–15.5)
WBC: 9.3 10*3/uL (ref 4.0–10.5)

## 2012-04-13 LAB — BASIC METABOLIC PANEL
BUN: 27 mg/dL — ABNORMAL HIGH (ref 6–23)
CO2: 22 mEq/L (ref 19–32)
Calcium: 9.3 mg/dL (ref 8.4–10.5)
Creat: 1.4 mg/dL — ABNORMAL HIGH (ref 0.50–1.10)
Glucose, Bld: 128 mg/dL — ABNORMAL HIGH (ref 70–99)
Sodium: 139 mEq/L (ref 135–145)

## 2012-04-13 LAB — HEPATIC FUNCTION PANEL
ALT: 15 U/L (ref 0–35)
Albumin: 4.2 g/dL (ref 3.5–5.2)
Alkaline Phosphatase: 75 U/L (ref 39–117)
Total Protein: 6.5 g/dL (ref 6.0–8.3)

## 2012-04-13 LAB — TSH: TSH: 1.523 u[IU]/mL (ref 0.350–4.500)

## 2012-04-13 NOTE — Progress Notes (Addendum)
Subjective: [name] is here today with her caregiver for c/o not feeling well.  She has been here prior in the last few months for same reasons. They only major change is that her husband passed away a few months ago.  Her daughter who is a nurse requested labs to make sure there was no physical changes accounting for her generalized weakness.  She reports no energy, not feeling well, feels like her body is not doing what she wants it to do.  Feeling weak, occasional loose stool.  Denies respiratory symptoms, no urinary c/o, no abdominal or back pain, nausea, vomiting.  No fever, no chest pain.  She does use oxygen prn QHS.  Was begun on this per prior hospitalization involving respiratory symptoms.  Was hospitalized earlier this year for respiratory and cardiac issues.  She notes that her mood does worsen when she doesn't feel well.   She notes that she is dealing ok with her husband's death.  Past Medical History  Diagnosis Date  . Hypertension   . Arthritis     RT HIP  . Osteoporosis   . Renal insufficiency   . Atrial fib/flutter, transient   . Thyroid disease    Past Surgical History  Procedure Date  . Knee arthroscopy     right  . Pacemaker insertion   . Joint replacement      RT HIP REPLACEMENT   ROS as in HPI    Objective:   Physical Exam  Filed Vitals:   04/13/12 1413  BP: 110/70  Pulse: 86  Temp: 98.4 F (36.9 C)  Resp: 14    General appearance: alert, no distress HEENT: normocephalic, sclerae anicteric, TMs pearly, nares patent, no discharge or erythema, pharynx normal Oral cavity: MMM, no lesions Neck: supple, no lymphadenopathy, no thyromegaly, no masses Heart: occasional skipped beat, otherwise RRR, normal S1, S2, no murmurs Lungs: CTA bilaterally, no wheezes, rhonchi, or rales Abdomen: +bs, soft, non tender, non distended, no masses, no hepatomegaly, no splenomegaly Pulses: 1+ symmetric, Neuro: CN2-12 intact, nonfocal exam    Adult ECG Report  Indication: generalized weakness, hx/o Afib with pacemaker  Rate: 81bpm  Rhythm: afib  QRS Axis: 3 degrees  PR Interval:  QRS Duration: 86ms  QTc:   Conduction Disturbances: none  Other Abnormalities: ST depression II  Patient's cardiac risk factors are: advanced age (older than 10 for men, 32 for women), hypertension and sedentary lifestyle.  EKG comparison: 09/2011, but that EKG had multiple abnormalities making comparison difficult/noncontributory  Narrative Interpretation: afib, non specific T wave abnormality   Assessment and Plan :    Encounter Diagnoses  Name Primary?  . Generalized weakness Yes  . Hypothyroidism   . Depression   . Renal insufficiency    Cause of her weakness/fatigue is probably multifactoral.   There is underlying depression, but she says her mood is ok, dealing with the loss of her husband.  Reviewed prior office notes regarding similar symptoms.  Urine suspicious for infection, although not conclusive.   Will send for culture.  She had UTI with prior eval for weakness.  Labs today, some sent stat.  We will call with results and plan.    Follow-up pending labs  Reviewed EKG with Dr. Susann Givens.  Will refer back to cardiology as well.  Of note, no pacer spikes noted on EKG

## 2012-04-14 ENCOUNTER — Encounter: Payer: Self-pay | Admitting: Medical

## 2012-04-15 LAB — URINE CULTURE

## 2012-04-16 ENCOUNTER — Encounter: Payer: Self-pay | Admitting: Medical

## 2012-04-16 ENCOUNTER — Other Ambulatory Visit: Payer: Self-pay | Admitting: Medical

## 2012-04-16 LAB — HEMOGLOBIN A1C
Hgb A1c MFr Bld: 5.6 % (ref <5.7)
Mean Plasma Glucose: 114 mg/dL (ref <117)

## 2012-04-16 MED ORDER — NITROFURANTOIN MONOHYD MACRO 100 MG PO CAPS: 100.0000 mg | ORAL_CAPSULE | Freq: Two times a day (BID) | ORAL | Status: DC

## 2012-04-18 ENCOUNTER — Telehealth: Payer: Self-pay | Admitting: Family Medicine

## 2012-04-18 NOTE — Telephone Encounter (Signed)
PATIENTS CARE GIVER IS AWARE IS AWARE OF HER APPOINTMENT TO SEE DR. Katrinka Blazing ON 04/23/12 @ 300 PM [phone]. CLS

## 2012-04-24 ENCOUNTER — Encounter: Payer: Self-pay | Admitting: Family Medicine

## 2012-04-24 ENCOUNTER — Inpatient Hospital Stay (HOSPITAL_COMMUNITY)
Admission: EM | Admit: 2012-04-24 | Discharge: 2012-04-27 | DRG: 281 | Disposition: A | Discharge status: Home Health | Payer: MEDICARE | Attending: Internal Medicine | Admitting: Internal Medicine

## 2012-04-24 ENCOUNTER — Ambulatory Visit (INDEPENDENT_AMBULATORY_CARE_PROVIDER_SITE_OTHER): Payer: MEDICARE | Admitting: Family Medicine

## 2012-04-24 ENCOUNTER — Emergency Department (HOSPITAL_COMMUNITY): Payer: MEDICARE

## 2012-04-24 ENCOUNTER — Encounter (HOSPITAL_COMMUNITY): Payer: Self-pay | Admitting: *Deleted

## 2012-04-24 VITALS — BP 120/80 | HR 75 | Wt 139.0 lb

## 2012-04-24 DIAGNOSIS — Z7982 Long term (current) use of aspirin: Secondary | ICD-10-CM

## 2012-04-24 DIAGNOSIS — N390 Urinary tract infection, site not specified: Secondary | ICD-10-CM | POA: Diagnosis present

## 2012-04-24 DIAGNOSIS — I5033 Acute on chronic diastolic (congestive) heart failure: Principal | ICD-10-CM | POA: Diagnosis present

## 2012-04-24 DIAGNOSIS — Z96649 Presence of unspecified artificial hip joint: Secondary | ICD-10-CM

## 2012-04-24 DIAGNOSIS — R0602 Shortness of breath: Secondary | ICD-10-CM

## 2012-04-24 DIAGNOSIS — F329 Major depressive disorder, single episode, unspecified: Secondary | ICD-10-CM

## 2012-04-24 DIAGNOSIS — K219 Gastro-esophageal reflux disease without esophagitis: Secondary | ICD-10-CM | POA: Diagnosis present

## 2012-04-24 DIAGNOSIS — N189 Chronic kidney disease, unspecified: Secondary | ICD-10-CM | POA: Diagnosis present

## 2012-04-24 DIAGNOSIS — I5030 Unspecified diastolic (congestive) heart failure: Secondary | ICD-10-CM | POA: Diagnosis present

## 2012-04-24 DIAGNOSIS — M129 Arthropathy, unspecified: Secondary | ICD-10-CM

## 2012-04-24 DIAGNOSIS — F3289 Other specified depressive episodes: Secondary | ICD-10-CM | POA: Diagnosis present

## 2012-04-24 DIAGNOSIS — I129 Hypertensive chronic kidney disease with stage 1 through stage 4 chronic kidney disease, or unspecified chronic kidney disease: Secondary | ICD-10-CM | POA: Diagnosis present

## 2012-04-24 DIAGNOSIS — Z950 Presence of cardiac pacemaker: Secondary | ICD-10-CM

## 2012-04-24 DIAGNOSIS — E039 Hypothyroidism, unspecified: Secondary | ICD-10-CM | POA: Diagnosis present

## 2012-04-24 DIAGNOSIS — M810 Age-related osteoporosis without current pathological fracture: Secondary | ICD-10-CM | POA: Diagnosis present

## 2012-04-24 DIAGNOSIS — I509 Heart failure, unspecified: Secondary | ICD-10-CM | POA: Diagnosis present

## 2012-04-24 DIAGNOSIS — R0609 Other forms of dyspnea: Secondary | ICD-10-CM

## 2012-04-24 DIAGNOSIS — I214 Non-ST elevation (NSTEMI) myocardial infarction: Secondary | ICD-10-CM

## 2012-04-24 DIAGNOSIS — N179 Acute kidney failure, unspecified: Secondary | ICD-10-CM | POA: Diagnosis present

## 2012-04-24 DIAGNOSIS — I4891 Unspecified atrial fibrillation: Secondary | ICD-10-CM | POA: Diagnosis present

## 2012-04-24 DIAGNOSIS — M1990 Unspecified osteoarthritis, unspecified site: Secondary | ICD-10-CM

## 2012-04-24 DIAGNOSIS — R0600 Dyspnea, unspecified: Secondary | ICD-10-CM

## 2012-04-24 HISTORY — DX: Chronic kidney disease, unspecified: N18.9

## 2012-04-24 HISTORY — DX: Disorder of kidney and ureter, unspecified: N28.9

## 2012-04-24 HISTORY — DX: Dependence on supplemental oxygen: Z99.81

## 2012-04-24 HISTORY — DX: Anxiety disorder, unspecified: F41.9

## 2012-04-24 HISTORY — DX: Hypothyroidism, unspecified: E03.9

## 2012-04-24 LAB — CBC WITH DIFFERENTIAL/PLATELET
Eosinophils Absolute: 0.1 10*3/uL (ref 0.0–0.7)
Eosinophils Relative: 1 % (ref 0–5)
Hemoglobin: 11.9 g/dL — ABNORMAL LOW (ref 12.0–15.0)
Lymphs Abs: 2.3 10*3/uL (ref 0.7–4.0)
MCH: 30.9 pg (ref 26.0–34.0)
MCV: 95.6 fL (ref 78.0–100.0)
Monocytes Relative: 13 % — ABNORMAL HIGH (ref 3–12)
RBC: 3.85 MIL/uL — ABNORMAL LOW (ref 3.87–5.11)

## 2012-04-24 LAB — URINALYSIS, ROUTINE W REFLEX MICROSCOPIC
Bilirubin Urine: NEGATIVE
Glucose, UA: NEGATIVE mg/dL
Hgb urine dipstick: NEGATIVE
Ketones, ur: NEGATIVE mg/dL
Nitrite: NEGATIVE
Protein, ur: NEGATIVE mg/dL
Specific Gravity, Urine: 1.012 (ref 1.005–1.030)
Urobilinogen, UA: 0.2 mg/dL (ref 0.0–1.0)
pH: 6 (ref 5.0–8.0)

## 2012-04-24 LAB — BASIC METABOLIC PANEL
BUN: 34 mg/dL — ABNORMAL HIGH (ref 6–23)
CO2: 21 mEq/L (ref 19–32)
Calcium: 9.2 mg/dL (ref 8.4–10.5)
GFR calc non Af Amer: 34 mL/min — ABNORMAL LOW (ref >90)
Glucose, Bld: 100 mg/dL — ABNORMAL HIGH (ref 70–99)
Potassium: 4.3 mEq/L (ref 3.5–5.1)

## 2012-04-24 LAB — TROPONIN I: Troponin I: 1.06 ng/mL

## 2012-04-24 LAB — DIGOXIN LEVEL: Digoxin Level: <0.3 ng/mL — ABNORMAL LOW (ref 0.8–2.0)

## 2012-04-24 LAB — URINE MICROSCOPIC-ADD ON

## 2012-04-24 LAB — PRO B NATRIURETIC PEPTIDE: Pro B Natriuretic peptide (BNP): 3796 pg/mL — ABNORMAL HIGH (ref 0–450)

## 2012-04-24 MED ORDER — ACETAMINOPHEN 325 MG PO TABS: 650.0000 mg | ORAL_TABLET | ORAL | Status: DC | PRN

## 2012-04-24 MED ORDER — SODIUM CHLORIDE 0.9 % IV SOLN: 250.0000 mL | INTRAVENOUS | Status: DC | PRN

## 2012-04-24 MED ORDER — ASPIRIN EC 325 MG PO TBEC
325.0000 mg | DELAYED_RELEASE_TABLET | Freq: Every day | ORAL | Status: DC
  Administered 2012-04-24 – 2012-04-27 (×4): 325 mg via ORAL
  Filled 2012-04-24 (×4): qty 1

## 2012-04-24 MED ORDER — ASPIRIN 81 MG PO CHEW
324.0000 mg | CHEWABLE_TABLET | Freq: Once | ORAL | Status: AC
  Administered 2012-04-24: 324 mg via ORAL
  Filled 2012-04-24: qty 4

## 2012-04-24 MED ORDER — AMLODIPINE BESYLATE 5 MG PO TABS
5.0000 mg | ORAL_TABLET | Freq: Every day | ORAL | Status: DC
  Administered 2012-04-24 – 2012-04-27 (×4): 5 mg via ORAL
  Filled 2012-04-24 (×4): qty 1

## 2012-04-24 MED ORDER — ATENOLOL 25 MG PO TABS
25.0000 mg | ORAL_TABLET | Freq: Two times a day (BID) | ORAL | Status: DC
  Administered 2012-04-24 – 2012-04-25 (×3): 25 mg via ORAL
  Filled 2012-04-24 (×5): qty 1

## 2012-04-24 MED ORDER — SODIUM CHLORIDE 0.9 % IV SOLN
INTRAVENOUS | Status: DC
  Administered 2012-04-24: 15:00:00 via INTRAVENOUS

## 2012-04-24 MED ORDER — ONDANSETRON HCL 4 MG/2ML IJ SOLN: 4.0000 mg | Freq: Four times a day (QID) | INTRAMUSCULAR | Status: DC | PRN

## 2012-04-24 MED ORDER — SODIUM CHLORIDE 0.9 % IJ SOLN: 3.0000 mL | INTRAMUSCULAR | Status: DC | PRN

## 2012-04-24 MED ORDER — DEXTROSE 5 % IV SOLN
1.0000 g | Freq: Once | INTRAVENOUS | Status: AC
  Administered 2012-04-24: 1 g via INTRAVENOUS
  Filled 2012-04-24: qty 10

## 2012-04-24 MED ORDER — FUROSEMIDE 10 MG/ML IJ SOLN
40.0000 mg | Freq: Two times a day (BID) | INTRAMUSCULAR | Status: DC
  Administered 2012-04-24 – 2012-04-26 (×4): 40 mg via INTRAVENOUS
  Filled 2012-04-24 (×4): qty 4

## 2012-04-24 MED ORDER — CITALOPRAM HYDROBROMIDE 20 MG PO TABS
20.0000 mg | ORAL_TABLET | Freq: Every day | ORAL | Status: DC
  Administered 2012-04-24 – 2012-04-27 (×4): 20 mg via ORAL
  Filled 2012-04-24 (×4): qty 1

## 2012-04-24 MED ORDER — BOOST PLUS PO LIQD
237.0000 mL | Freq: Three times a day (TID) | ORAL | Status: DC
Start: 2012-04-25 — End: 2012-04-27
  Administered 2012-04-25 – 2012-04-27 (×7): 237 mL via ORAL
  Filled 2012-04-24 (×12): qty 237

## 2012-04-24 MED ORDER — POTASSIUM CHLORIDE CRYS ER 10 MEQ PO TBCR
10.0000 meq | EXTENDED_RELEASE_TABLET | Freq: Every day | ORAL | Status: DC
  Administered 2012-04-24 – 2012-04-27 (×4): 10 meq via ORAL
  Filled 2012-04-24 (×4): qty 1

## 2012-04-24 MED ORDER — LEVOTHYROXINE SODIUM 88 MCG PO TABS
88.0000 ug | ORAL_TABLET | Freq: Every day | ORAL | Status: DC
  Administered 2012-04-24 – 2012-04-27 (×4): 88 ug via ORAL
  Filled 2012-04-24 (×5): qty 1

## 2012-04-24 MED ORDER — SODIUM CHLORIDE 0.9 % IJ SOLN
3.0000 mL | Freq: Two times a day (BID) | INTRAMUSCULAR | Status: DC
  Administered 2012-04-25 – 2012-04-27 (×5): 3 mL via INTRAVENOUS

## 2012-04-24 MED ORDER — FAMOTIDINE 20 MG PO TABS
20.0000 mg | ORAL_TABLET | Freq: Two times a day (BID) | ORAL | Status: DC
  Administered 2012-04-24 – 2012-04-26 (×4): 20 mg via ORAL
  Filled 2012-04-24 (×5): qty 1

## 2012-04-24 MED ORDER — FUROSEMIDE 10 MG/ML IJ SOLN
40.0000 mg | Freq: Once | INTRAMUSCULAR | Status: AC
  Administered 2012-04-24: 40 mg via INTRAVENOUS
  Filled 2012-04-24: qty 4

## 2012-04-24 MED ORDER — CEFTRIAXONE SODIUM 1 G IJ SOLR
1.0000 g | INTRAMUSCULAR | Status: DC
  Administered 2012-04-25 – 2012-04-26 (×2): 1 g via INTRAVENOUS
  Filled 2012-04-24 (×3): qty 10

## 2012-04-24 NOTE — ED Notes (Addendum)
Patient with recent onset of cough, worse cough last night.  She was seen by Dr Katrinka Blazing for follow up yesterday.  Today she was seen by primary MD and sent to ED for further eval of shortness of breath and weakness.  She has EKG with her from MD office.  Patient denies chest pain.  She denies recent n/v/d.  Patient pulse ox was reported to be in the 80's at the MD office.

## 2012-04-24 NOTE — ED Notes (Addendum)
C/o intermittent episodes SOB with minimal exertion & orthopnea x 2 weeks, worse last night. Reports non prod cough x 2 weeks. Denies fever, chills, sore throat, nasal congestion, n/v/d, CP. No pedal edema noted. States saw cardiologist, Dr. Katrinka Blazing yesterday & "everything was fine".  Family reports 5lb weight gain since last week

## 2012-04-24 NOTE — Consult Note (Signed)
Admit date: 04/24/2012 Referring Physician:  Dr. Cena Benton Primary Physician:  Dr. Susann Givens Primary Cardiologist  Dr. Verdis Prime Reason for Consultation  SOB/CHF/elevated troponin  HPI: [name] is a 78 y.o. female with a history of hypothyroid, diastolic HF and Atrial fibrillation presenting with 2 day complaints of worsening SOB and DOE. Reportedly patient used to be on amiodarone but family members feel that patient's lungs may have been affected as a side effect of being on amiodarone and patient was switched to B blocker and digoxin. Reports that digoxin discontinued ~ 2 weeks ago. Was seen by cardiology yesterday 04/23/12 and her lasix was recently increased from 40 mg of lasix to 60 mg of lasix but this change did not improve patient's condition and she presents today for further evaluation. Patient mentions that laying flat makes her SOB worse, denies any current chest discomfort, reportedly had transient chest discomfort this am but was unable to characterize it and when daughter brings it up pt responds, "I forgot." While in the ED patient received aspirin 325 mg po and lasix 40 mg IV x 1. BNP was obtained which showed 3796 and a troponin of 1.06.  We are now asked to consult to help in management of CHF.      PMH:   Past Medical History  Diagnosis Date  . Hypertension   . Arthritis     RT HIP  . Osteoporosis   . Renal insufficiency   . Atrial fib/flutter, transient   . Thyroid disease   . CHF (congestive heart failure)      PSH:   Past Surgical History  Procedure Date  . Knee arthroscopy     right  . Pacemaker insertion   . Joint replacement      RT HIP REPLACEMENT    Allergies:  Percocet; Procainamide; Promethazine hcl; Quinidine; Septra; Tiazac; Warfarin sodium; Amoxicillin; Ciprofloxacin; and Polocaine Prior to Admit Meds:   Prescriptions prior to admission  Medication Sig Dispense Refill  . acetaminophen (TYLENOL) 650 MG CR tablet Take 650-1,300 mg by mouth 2  (two) times daily. Takes 650mg  in the am & 1300mg  at bedtime      . amLODipine (NORVASC) 5 MG tablet Take 5 mg by mouth daily.      Marland Kitchen aspirin 81 MG tablet Take 81 mg by mouth daily.        Marland Kitchen atenolol (TENORMIN) 25 MG tablet Take 1 tablet (25 mg total) by mouth 2 (two) times daily.  30 tablet  0  . citalopram (CELEXA) 20 MG tablet Take 1 tablet (20 mg total) by mouth daily.  30 tablet  3  . Cyanocobalamin (VITAMIN B-12 PO) Take 1 tablet by mouth daily.      . famotidine (PEPCID) 20 MG tablet Take 1 tablet (20 mg total) by mouth 2 (two) times daily.  21 tablet  0  . furosemide (LASIX) 40 MG tablet Take 1 tablet (40 mg total) by mouth daily.  60 tablet  0  . lactose free nutrition (BOOST PLUS) LIQD Take 237 mLs by mouth daily.      Marland Kitchen levothyroxine (SYNTHROID, LEVOTHROID) 88 MCG tablet Take 88 mcg by mouth daily.      . Multiple Vitamin (MULITIVITAMIN WITH MINERALS) TABS Take 1 tablet by mouth daily.      . potassium chloride (K-DUR,KLOR-CON) 10 MEQ tablet Take 10 mEq by mouth daily.       Fam HX:    Family History  Problem Relation Age of Onset  .  Hypertension Mother   . Heart disease Mother     Heart failure (vague history)   Social HX:    History   Social History  . Marital Status: Married    Spouse Name: N/A    Number of Children: N/A  . Years of Education: N/A   Occupational History  . Not on file.   Social History Main Topics  . Smoking status: Never Smoker   . Smokeless tobacco: Never Used  . Alcohol Use: No  . Drug Use: No  . Sexually Active: Not on file   Other Topics Concern  . Not on file   Social History Narrative   Lives at home with husband with daily caregiver.  Three daughters.       ROS:  All 11 ROS were addressed and are negative except what is stated in the HPI  Physical Exam:     General: Well developed, well nourished, in no acute distress Head: Eyes PERRLA, No xanthomas.   Normal cephalic and atramatic  Lungs:   Crackles at bases and  anteriorly Heart:   HRRR S1 S2 Pulses are 2+ & equal.            No carotid bruit. No JVD.  No abdominal bruits. No femoral bruits. Abdomen: Bowel sounds are positive, abdomen soft and non-tender without masses or                  Hernia's noted. Msk:  Back normal, normal gait. Normal strength and tone for age. Extremities:   No clubbing, cyanosis or edema.  DP +1 Neuro: Alert and oriented X 3. Psych:  Good affect, responds appropriately    Labs:   Lab Results  Component Value Date   WBC 12.1* 04/24/2012   HGB 11.9* 04/24/2012   HCT 36.8 04/24/2012   MCV 95.6 04/24/2012   PLT 229 04/24/2012    Lab 04/24/12 1332  NA 138  K 4.3  CL 103  CO2 21  BUN 34*  CREATININE 1.32*  CALCIUM 9.2  PROT --  BILITOT --  ALKPHOS --  ALT --  AST --  GLUCOSE 100*   No results found for this basename: PTT   Lab Results  Component Value Date   INR 1.12 04/29/2010   INR 1.1 11/21/2006   Lab Results  Component Value Date   CKTOTAL 71 09/18/2011   CKMB 3.5 09/18/2011   TROPONINI 1.06* 04/24/2012       Radiology:  Dg Chest 2 View  04/24/2012  *RADIOLOGY REPORT*  Clinical Data: Short of breath.  History of CHF.  CHEST - 2 VIEW  Comparison: 09/29/2011.  Findings: Dual lead left subclavian cardiac pacemaker. Cardiopericardial silhouette remains enlarged. Pulmonary vascular congestion is present with cephalization of pulmonary blood flow. Chronic bilateral apical interstitial changes are present. Hyperinflation compatible with emphysema. Chronic blunting of the costophrenic angles on the lateral view.  Aortic atherosclerosis. Exaggerated thoracic kyphosis.  No airspace disease.  No effusion. Left basilar atelectasis.  IMPRESSION: Cardiomegaly and pulmonary vascular congestion.   Original Report Authenticated By: Andreas Newport, M.D.     EKG: A paced rhythm with nonspecific ST abnormality  ASSESSMENT:  1. Acute on chronic diastolic CHF 2. Paroxysmal atrial fibrillation maintaining A paced  rhythm 3. HTN 4. Elevated Troponin secondary to demand ischemia 5. Chronic renal insufficiency 6. Hypothyroidism  PLAN:   1.  IV Lasix 40mg  BID 2.  Follow renal function closely while diuresing 3.  Continue Atenolol for PAF 4.  2D  echo pending to reassess LVF (EF 55-60% 09/2011) 5.  Further recommendations per Dr. Flossie Dibble, MD  04/24/2012  7:09 PM

## 2012-04-24 NOTE — ED Notes (Signed)
Patient transported to X-ray 

## 2012-04-24 NOTE — Progress Notes (Signed)
Patient came in today with S.O.B I put pulse ox on pt w/room air 85 % we walked to room pulse ox dropped to 80 % w/room air I put pt on 2 oxygen and pt pulse ox came up to 96

## 2012-04-24 NOTE — Progress Notes (Addendum)
ANTIBIOTIC CONSULT NOTE - INITIAL  Pharmacy Consult for ceftriaxone Indication: leukocytosis, ?UTI  Allergies  Allergen Reactions  . Percocet (Oxycodone-Acetaminophen) Other (See Comments)    confusion  . Procainamide Other (See Comments)    Lupus reaction  . Promethazine Hcl Other (See Comments)    confusion  . Quinidine Other (See Comments)    Skin peels  . Septra (Sulfamethoxazole W/Trimethoprim (Co-Trimoxazole)) Other (See Comments)    Dizziness and tremor  . Tiazac (Diltiazem Hcl) Hives  . Warfarin Sodium     GI bleed  . Amoxicillin Rash  . Ciprofloxacin Rash  . Polocaine (Mepivacaine Hcl) Palpitations    Patient Measurements: Height: 5\' 3"  (160 cm) Weight: 134 lb 11.2 oz (61.1 kg) IBW/kg (Calculated) : 52.4  Adjusted Body Weight: n/a  Vital Signs: Temp: 98.2 F (36.8 C) (12/17 1809) Temp src: Oral (12/17 1809) BP: 151/60 mmHg (12/17 1809) Pulse Rate: 75  (12/17 1809) Intake/Output from previous day:   Intake/Output from this shift: Total I/O In: -  Out: 1150 [Urine:1150]  Labs:  [facility] 04/24/12 1332  WBC 12.1*  HGB 11.9*  PLT 229  LABCREA --  CREATININE 1.32*   Estimated Creatinine Clearance: 23.4 ml/min (by C-G formula based on Cr of 1.32). No results found for this basename: VANCOTROUGH:2,VANCOPEAK:2,VANCORANDOM:2,GENTTROUGH:2,GENTPEAK:2,GENTRANDOM:2,TOBRATROUGH:2,TOBRAPEAK:2,TOBRARND:2,AMIKACINPEAK:2,AMIKACINTROU:2,AMIKACIN:2, in the last 72 hours   Microbiology: Recent Results (from the past 720 hour(s))  URINE CULTURE     Status: Normal   Collection Time   04/13/12  3:38 PM      Component Value Range Status Comment   Colony Count >=100,000 COLONIES/ML   Final    Organism ID, Bacteria Multiple bacterial morphotypes present, none   Final    Organism ID, Bacteria predominant. Suggest appropriate recollection if    Final    Organism ID, Bacteria clinically indicated.   Final     Medical History: Past Medical History  Diagnosis Date  .  Hypertension   . Arthritis     RT HIP  . Osteoporosis   . Renal insufficiency   . Atrial fib/flutter, transient   . Thyroid disease   . CHF (congestive heart failure)     Medications:  Scheduled:    . amLODipine  5 mg Oral Daily  . [COMPLETED] aspirin  324 mg Oral Once  . aspirin EC  325 mg Oral Daily  . atenolol  25 mg Oral BID  . citalopram  20 mg Oral Daily  . famotidine  20 mg Oral BID  . [COMPLETED] furosemide  40 mg Intravenous Once  . furosemide  40 mg Intravenous BID  . lactose free nutrition  237 mL Oral TID WC  . levothyroxine  88 mcg Oral QAC breakfast  . potassium chloride  10 mEq Oral Daily  . sodium chloride  3 mL Intravenous Q12H   Assessment: 78 yo female with leukocytosis, ?UTI admitted for SOB, DOE.  Pharmacy asked to start empiric ceftriaxone for now.  Estimated CrCl ~ 23 ml/min.  Spoke with [facility] PA, ok to give ceftriaxone despite PCN allergy (rash to amoxicillin).  Goal of Therapy:  resolution of infection  Plan:  1. Ceftriaxone 1g IV q 24 hrs. 2. F/U for length of antibiotic therapy.  Terecia Plaut C 04/24/2012,6:50 PM

## 2012-04-24 NOTE — ED Notes (Signed)
Admitting MD at bedside.

## 2012-04-24 NOTE — Progress Notes (Signed)
  Subjective:    Patient ID: [name], female    DOB: 02/02/1920, 78 y.o.   MRN: [id number]  HPI She is here for evaluation of sudden onset of shortness of breath that occurred earlier today. She was seen in our office on December 6 as well. Pulse ox on room air was 85. She did achieve a pulse ox of in the mid 90s on oxygen. Review of her record indicates previous cardiac difficulty in may of this year. That record was reviewed.   Review of Systems     Objective:   Physical Exam Alert and slightly cachectic. Cardiac exam shows a regular rhythm. Lungs show scattered rhonchi. EKG shows no acute changes.       Assessment & Plan:   1. Shortness of breath    with her previous cardiac history and pulse ox of 85, I recommended that she go to the emergency room for further evaluation.

## 2012-04-24 NOTE — H&P (Signed)
[facility] History and Physical  MARYFRANCES PORTUGAL XBJ:[id number] DOB: July 30, 1919 DOA: 04/24/2012  Referring physician:Dr. Clarene Duke PCP: Carollee Herter, MD  Specialists: Patient see's Dr. Katrinka Blazing with [facility] cardiology  Chief Complaint: SOB and DOE   HPI: [name] is a 78 y.o. female  With history of hypothyroid, diastolic HF and Atrial fibrillation presenting with 2 day complaints of worsening SOB and DOE.  Reportedly patient used to be on amiodarone but family members feel that patient's lungs may have been affected as a side effect of being on amiodarone and patient was switched to B blocker and digoxin.  Reports that digoxin discontinued ~ 2 weeks ago.  Was seen by cardiology yesterday 04/23/12 and her lasix was recently increased from 40 mg of lasix to 60 mg of lasix but this change did not improve patient's condition and she presents today for further evaluation.   Patient mentions that laying flat makes her SOB worse, denies any current chest discomfort, reportedly had transient chest discomfort this am but was unable to characterize it and when daughter brings it up pt responds, "I forgot."    While in the ED patient received aspirin 325 mg po and lasix 40 mg IV x 1.  Was also started on normal saline at 75 cc/hr which I discontinued.  BNP was obtained which showed 3796 and a troponin of 1.06.  Given hypoxia we were asked to further evaluate patient for further admission orders and evaluation.  Pt denies any cough, fever  Review of Systems: All systems reviewed and negative unless otherwise stated above.  Past Medical History  Diagnosis Date  . Hypertension   . Arthritis     RT HIP  . Osteoporosis   . Renal insufficiency   . Atrial fib/flutter, transient   . Thyroid disease   . CHF (congestive heart failure)    Past Surgical History  Procedure Date  . Knee arthroscopy     right  . Pacemaker insertion   . Joint replacement      RT HIP REPLACEMENT    Social History:  reports that she has never smoked. She has never used smokeless tobacco. She reports that she does not drink alcohol or use illicit drugs. Patient lives at home  Can patient participate in ADLs? Yes prior to acute illness  Allergies  Allergen Reactions  . Amoxicillin Hives  . Ciprofloxacin Hives  . Polocaine (Mepivacaine Hcl) Hives  . Procaine Hives  . Promethazine Hcl Hives  . Quinidine   . Septra (Sulfamethoxazole W/Trimethoprim (Co-Trimoxazole)) Other (See Comments)    Dizziness and tremor  . Tiazac (Diltiazem Hcl) Hives  . Vicodin (Hydrocodone-Acetaminophen) Hives  . Warfarin Sodium     GI bleed    Family History  Problem Relation Age of Onset  . Hypertension Mother   . Heart disease Mother     Heart failure (vague history)  None new reported when asked patient directly  Prior to Admission medications   Medication Sig Start Date End Date Taking? Authorizing Provider  nitrofurantoin, macrocrystal-monohydrate, (MACROBID) 100 MG capsule Take 1 capsule (100 mg total) by mouth 2 (two) times daily. 04/16/12  Yes Kermit Balo Tysinger, PA  amLODipine (NORVASC) 5 MG tablet Take 5 mg by mouth daily.    Historical Provider, MD  aspirin 81 MG tablet Take 81 mg by mouth daily.      Historical Provider, MD  atenolol (TENORMIN) 25 MG tablet Take 1 tablet (25 mg total) by mouth 2 (two) times daily. 09/30/11  Conley Canal, MD  citalopram (CELEXA) 20 MG tablet Take 1 tablet (20 mg total) by mouth daily. 02/24/12   Ronnald Nian, MD  digoxin (LANOXIN) 0.125 MG tablet Take 1 tablet (0.125 mg total) by mouth daily. 09/30/11 09/29/12  Simbiso Ranga, MD  famotidine (PEPCID) 20 MG tablet Take 1 tablet (20 mg total) by mouth 2 (two) times daily. 09/30/11 09/29/12  Simbiso Ranga, MD  furosemide (LASIX) 40 MG tablet Take 1 tablet (40 mg total) by mouth daily. 09/30/11 09/29/12  Simbiso Ranga, MD  lactose free nutrition (BOOST PLUS) LIQD Take 237 mLs by mouth 3 (three) times daily with  meals. 09/30/11   Simbiso Ranga, MD  levothyroxine (SYNTHROID) 88 MCG tablet Take 1 tablet (88 mcg total) by mouth daily. PATIENT NEEDS AN APPOINTMENT TO FOLLOW UP ON HER THYROID. 03/02/12 03/02/13  Ronnald Nian, MD  Multiple Vitamin (MULITIVITAMIN WITH MINERALS) TABS Take 1 tablet by mouth daily.    Historical Provider, MD  potassium chloride (KLOR-CON) 8 MEQ tablet Take 8 mEq by mouth 2 (two) times daily.    Historical Provider, MD   Physical Exam: Filed Vitals:   04/24/12 1330 04/24/12 1400 04/24/12 1430 04/24/12 1500  BP: 132/64 129/63 132/63 127/59  Pulse: 75 69 70 69  Temp:      TempSrc:      Resp: 24 22 18 21   Height:      Weight:      SpO2: 94% 96% 95% 96%     General:  Pt in NAD, sitting up, Alert and awake  Eyes: EOMI, non icteric  ENT: No masses on visual inspection, normal exterior appearance  Neck: supple, no goiter  Cardiovascular: irregular, No MRG  Respiratory: fine rhales BL at bases, no wheezes  Abdomen: soft, NT, ND, obese  Skin: warm and dry  Musculoskeletal: no cyanosis or clubbing  Psychiatric: mood and affect appropriate  Neurologic: no facial asymmetry, moves all extremities, answers questions appropriately  Labs on Admission:  Basic Metabolic Panel:  Lab 04/24/12 1610  NA 138  K 4.3  CL 103  CO2 21  GLUCOSE 100*  BUN 34*  CREATININE 1.32*  CALCIUM 9.2  MG --  PHOS --   Liver Function Tests: No results found for this basename: AST:5,ALT:5,ALKPHOS:5,BILITOT:5,PROT:5,ALBUMIN:5 in the last 168 hours No results found for this basename: LIPASE:5,AMYLASE:5 in the last 168 hours No results found for this basename: AMMONIA:5 in the last 168 hours CBC:  Lab 04/24/12 1332  WBC 12.1*  NEUTROABS 8.1*  HGB 11.9*  HCT 36.8  MCV 95.6  PLT 229   Cardiac Enzymes:  Lab 04/24/12 1331  CKTOTAL --  CKMB --  CKMBINDEX --  TROPONINI 1.06*    BNP (last 3 results)  Basename 04/24/12 1331 09/28/11 0555 09/20/11 0500  PROBNP 3796.0*  5966.0* 5485.0*   CBG: No results found for this basename: GLUCAP:5 in the last 168 hours  Radiological Exams on Admission: Dg Chest 2 View  04/24/2012  *RADIOLOGY REPORT*  Clinical Data: Short of breath.  History of CHF.  CHEST - 2 VIEW  Comparison: 09/29/2011.  Findings: Dual lead left subclavian cardiac pacemaker. Cardiopericardial silhouette remains enlarged. Pulmonary vascular congestion is present with cephalization of pulmonary blood flow. Chronic bilateral apical interstitial changes are present. Hyperinflation compatible with emphysema. Chronic blunting of the costophrenic angles on the lateral view.  Aortic atherosclerosis. Exaggerated thoracic kyphosis.  No airspace disease.  No effusion. Left basilar atelectasis.  IMPRESSION: Cardiomegaly and pulmonary vascular congestion.   Original Report  Authenticated By: Andreas Newport, M.D.     EKG: Independently reviewed. Atrial paced rhythm, no ST elevations or depressions  Assessment/Plan Active Problems: CHF acute on Chronic Hypothyroidism Atrial fibrillation Arthritis Elevated troponin  1. Acute CHF exacerbation - At this point will continue B blocker given history of atrial fibrillation and current degree of CHF exacerbation with BNP 3000's.   - Since echocardiogram > 6 months ago will plan on repeating - At this point not sure if systolic vs diastolic.  Suspect diastolic given recent echocardiogram results on 09/18/11 which showed ef of 55-60% - fluid restrict - sodium restrict - Cardiology consulted - Lasix IV BID - Given renal insufficiency will not start ACE I at this point.  2. Elevated troponin - Cardiology consulted - Monitor Troponins - continue aspirin - supplemental oxygen - no chest pain at this juncture patient asymptomatic. - telemetry monitoring  3. Atrial fibrillation - Discussed with cardiology and will continue atenolol for rate control. - Patient has had GI bleed on coumadin and as such patient is not  on anticoagulation  4. Hypothyroidism - no myxedema on visual exam - Will recheck TSH while in house.  5. Arthritis - Avoid NSAIDs - Tylenol at this juncture.  6. Leukocytosis - At this point patient afebrile  - Will start rocephin given that patient has Leukocytes on U/A with low threshold to discontinue antibiotics   Code Status: Full, although patient has living will which delineates further care pending medical scenario Family Communication: Patient and daughter at bedside Disposition Plan: Pending clinical improvement  Time spent: > 55 minutes  Penny Pia [facility] Pager [phone]  If 7PM-7AM, please contact night-coverage [email or web address] Password [email or web address] 04/24/2012, 4:04 PM

## 2012-04-24 NOTE — ED Provider Notes (Signed)
History     CSN: [id number]  Arrival date & time 04/24/12  1230   First MD Initiated Contact with Patient 04/24/12 1256      Chief Complaint  Patient presents with  . Shortness of Breath     HPI Pt was seen at 1305.  Per pt and family, c/o gradual onset and worsening of cough and SOB for the past 2 weeks, worse since yesterday.  States the SOB worsens on exertion and laying flat.  Family states pt was eval by Cards Dr. Katrinka Blazing yesterday, "told she was fine" and had her lasix dose increased due to 5 lb weight gait since last week.  Pt was eval by her PMD Dr. Susann Givens today, had office O2 Sats 85% R/A, and was sent to the ED for further eval.  Denies CP/palpitations, no fevers, no back pain, no abd pain, no N/V/D.     Cards:  Dr Verdis Prime PMD:  Dr. Susann Givens Past Medical History  Diagnosis Date  . Hypertension   . Arthritis     RT HIP  . Osteoporosis   . Renal insufficiency   . Atrial fib/flutter, transient   . Thyroid disease   . CHF (congestive heart failure)     Past Surgical History  Procedure Date  . Knee arthroscopy     right  . Pacemaker insertion   . Joint replacement      RT HIP REPLACEMENT    Family History  Problem Relation Age of Onset  . Hypertension Mother   . Heart disease Mother     Heart failure (vague history)    History  Substance Use Topics  . Smoking status: Never Smoker   . Smokeless tobacco: Never Used  . Alcohol Use: No      Review of Systems ROS: Statement: All systems negative except as marked or noted in the HPI; Constitutional: Negative for fever and chills. ; ; Eyes: Negative for eye pain, redness and discharge. ; ; ENMT: Negative for ear pain, hoarseness, nasal congestion, sinus pressure and sore throat. ; ; Cardiovascular: +SOB, orthopnea, DOE. Negative for chest pain, palpitations, diaphoresis, and peripheral edema. ; ; Respiratory: +cough. Negative for wheezing and stridor. ; ; Gastrointestinal: Negative for nausea, vomiting,  diarrhea, abdominal pain, blood in stool, hematemesis, jaundice and rectal bleeding. . ; ; Genitourinary: Negative for dysuria, flank pain and hematuria. ; ; Musculoskeletal: Negative for back pain and neck pain. Negative for swelling and trauma.; ; Skin: Negative for pruritus, rash, abrasions, blisters, bruising and skin lesion.; ; Neuro: Negative for headache, lightheadedness and neck stiffness. Negative for weakness, altered level of consciousness , altered mental status, extremity weakness, paresthesias, involuntary movement, seizure and syncope.       Allergies  Amoxicillin; Ciprofloxacin; Polocaine; Procaine; Promethazine hcl; Quinidine; Septra; Tiazac; Vicodin; and Warfarin sodium  Home Medications   Current Outpatient Rx  Name  Route  Sig  Dispense  Refill  . NITROFURANTOIN MONOHYD MACRO 100 MG PO CAPS   Oral   Take 1 capsule (100 mg total) by mouth 2 (two) times daily.   14 capsule   0   . AMLODIPINE BESYLATE 5 MG PO TABS   Oral   Take 5 mg by mouth daily.         . ASPIRIN 81 MG PO TABS   Oral   Take 81 mg by mouth daily.           . ATENOLOL 25 MG PO TABS   Oral   Take  1 tablet (25 mg total) by mouth 2 (two) times daily.   30 tablet   0   . CITALOPRAM HYDROBROMIDE 20 MG PO TABS   Oral   Take 1 tablet (20 mg total) by mouth daily.   30 tablet   3   . DIGOXIN 0.125 MG PO TABS   Oral   Take 1 tablet (0.125 mg total) by mouth daily.   30 tablet   0   . FAMOTIDINE 20 MG PO TABS   Oral   Take 1 tablet (20 mg total) by mouth 2 (two) times daily.   21 tablet   0   . FUROSEMIDE 40 MG PO TABS   Oral   Take 1 tablet (40 mg total) by mouth daily.   60 tablet   0   . BOOST PLUS PO LIQD   Oral   Take 237 mLs by mouth 3 (three) times daily with meals.   30 Can   0   . LEVOTHYROXINE SODIUM 88 MCG PO TABS   Oral   Take 1 tablet (88 mcg total) by mouth daily. PATIENT NEEDS AN APPOINTMENT TO FOLLOW UP ON HER THYROID.   90 tablet   0   . ADULT  MULTIVITAMIN W/MINERALS CH   Oral   Take 1 tablet by mouth daily.         Marland Kitchen POTASSIUM CHLORIDE ER 8 MEQ PO TBCR   Oral   Take 8 mEq by mouth 2 (two) times daily.           BP 127/59  Pulse 69  Temp 99 F (37.2 C) (Oral)  Resp 21  Ht 5\' 5"  (1.651 m)  Wt 139 lb (63.05 kg)  BMI 23.13 kg/m2  SpO2 96%  Physical Exam 1310: Physical examination:  Nursing notes reviewed; Vital signs and O2 SAT reviewed;  Constitutional: Well developed, Well nourished, Well hydrated, In no acute distress; Head:  Normocephalic, atraumatic; Eyes: EOMI, PERRL, No scleral icterus; ENMT: Mouth and pharynx normal, Mucous membranes moist; Neck: Supple, Full range of motion, No lymphadenopathy; Cardiovascular: Regular rate and rhythm, No gallop; Respiratory: Breath sounds coarse & equal bilaterally, No wheezes.  Speaking full sentences with ease, Normal respiratory effort/excursion; Chest: Nontender, Movement normal; Abdomen: Soft, Nontender, Nondistended, Normal bowel sounds;; Extremities: Pulses normal, No tenderness, No edema, No calf edema or asymmetry.; Neuro: AA&Ox3, Major CN grossly intact.  Speech clear. No gross focal motor or sensory deficits in extremities.; Skin: Color normal, Warm, Dry.    ED Course  Procedures     MDM  MDM Reviewed: previous chart, nursing note and vitals Reviewed previous: labs and ECG Interpretation: labs, ECG and x-ray    Date: 04/24/2012  Rate: 69  Rhythm: normal sinus rhythm, paced rhythm  QRS Axis: normal  Intervals: normal  ST/T Wave abnormalities: nonspecific ST/T changes  Conduction Disutrbances:first-degree A-V block   Narrative Interpretation:   Old EKG Reviewed: changes noted; previous EKG dated 09/18/2011 was ventricular paced rhythm.  Results for orders placed during the hospital encounter of 04/24/12  URINALYSIS, ROUTINE W REFLEX MICROSCOPIC      Component Value Range   Color, Urine YELLOW  YELLOW   APPearance HAZY (*) CLEAR   Specific Gravity, Urine  1.012  1.005 - 1.030   pH 6.0  5.0 - 8.0   Glucose, UA NEGATIVE  NEGATIVE mg/dL   Hgb urine dipstick NEGATIVE  NEGATIVE   Bilirubin Urine NEGATIVE  NEGATIVE   Ketones, ur NEGATIVE  NEGATIVE mg/dL  Protein, ur NEGATIVE  NEGATIVE mg/dL   Urobilinogen, UA 0.2  0.0 - 1.0 mg/dL   Nitrite NEGATIVE  NEGATIVE   Leukocytes, UA LARGE (*) NEGATIVE  CBC WITH DIFFERENTIAL      Component Value Range   WBC 12.1 (*) 4.0 - 10.5 K/uL   RBC 3.85 (*) 3.87 - 5.11 MIL/uL   Hemoglobin 11.9 (*) 12.0 - 15.0 g/dL   HCT 04.5  40.9 - 81.1 %   MCV 95.6  78.0 - 100.0 fL   MCH 30.9  26.0 - 34.0 pg   MCHC 32.3  30.0 - 36.0 g/dL   RDW 91.4 (*) 78.2 - 95.6 %   Platelets 229  150 - 400 K/uL   Neutrophils Relative 67  43 - 77 %   Neutro Abs 8.1 (*) 1.7 - 7.7 K/uL   Lymphocytes Relative 19  12 - 46 %   Lymphs Abs 2.3  0.7 - 4.0 K/uL   Monocytes Relative 13 (*) 3 - 12 %   Monocytes Absolute 1.6 (*) 0.1 - 1.0 K/uL   Eosinophils Relative 1  0 - 5 %   Eosinophils Absolute 0.1  0.0 - 0.7 K/uL   Basophils Relative 0  0 - 1 %   Basophils Absolute 0.0  0.0 - 0.1 K/uL  BASIC METABOLIC PANEL      Component Value Range   Sodium 138  135 - 145 mEq/L   Potassium 4.3  3.5 - 5.1 mEq/L   Chloride 103  96 - 112 mEq/L   CO2 21  19 - 32 mEq/L   Glucose, Bld 100 (*) 70 - 99 mg/dL   BUN 34 (*) 6 - 23 mg/dL   Creatinine, Ser 2.13 (*) 0.50 - 1.10 mg/dL   Calcium 9.2  8.4 - 08.6 mg/dL   GFR calc non Af Amer 34 (*) >90 mL/min   GFR calc Af Amer 40 (*) >90 mL/min  TROPONIN I      Component Value Range   Troponin I 1.06 (*) <0.30 ng/mL  PRO B NATRIURETIC PEPTIDE      Component Value Range   Pro B Natriuretic peptide (BNP) 3796.0 (*) 0 - 450 pg/mL  DIGOXIN LEVEL      Component Value Range   Digoxin Level <0.3 (*) 0.8 - 2.0 ng/mL  URINE MICROSCOPIC-ADD ON      Component Value Range   Squamous Epithelial / LPF RARE  RARE   WBC, UA 3-6  <3 WBC/hpf   RBC / HPF 0-2  <3 RBC/hpf   Bacteria, UA RARE  RARE   Dg Chest 2  View 04/24/2012  *RADIOLOGY REPORT*  Clinical Data: Short of breath.  History of CHF.  CHEST - 2 VIEW  Comparison: 09/29/2011.  Findings: Dual lead left subclavian cardiac pacemaker. Cardiopericardial silhouette remains enlarged. Pulmonary vascular congestion is present with cephalization of pulmonary blood flow. Chronic bilateral apical interstitial changes are present. Hyperinflation compatible with emphysema. Chronic blunting of the costophrenic angles on the lateral view.  Aortic atherosclerosis. Exaggerated thoracic kyphosis.  No airspace disease.  No effusion. Left basilar atelectasis.  IMPRESSION: Cardiomegaly and pulmonary vascular congestion.   Original Report Authenticated By: Andreas Newport, M.D.     Results for CAERA, ENWRIGHT (MRN [id number]) as of 04/24/2012 15:38  Ref. Range 10/10/2011 11:39 04/13/2012 14:50 04/24/2012 13:32  BUN Latest Range: 6-23 mg/dL 45 (H) 27 (H) 34 (H)  Creatinine Latest Range: 0.50-1.10 mg/dL 5.28 (H) 4.13 (H) 2.44 (H)    Results for Kitchen, Mohini  Leonard Schwartz (MRN [id number]) as of 04/24/2012 15:38  Ref. Range 09/29/2011 15:55 09/30/2011 05:40 10/10/2011 11:39 04/13/2012 14:50 04/24/2012 13:32  Hemoglobin Latest Range: 12.0-15.0 g/dL 56.2 (L) 13.0 (L) 86.5 12.4 11.9 (L)  HCT Latest Range: 36.0-46.0 % 36.3 36.2 43.2 37.8 36.8   Results for KARMIN, KASPRZAK (MRN [id number]) as of 04/24/2012 15:38  Ref. Range 09/17/2011 15:27 09/20/2011 05:00 09/28/2011 05:55 04/24/2012 13:31  Pro B Natriuretic peptide (BNP) Latest Range: 0-450 pg/mL 3331.0 (H) 5485.0 (H) 5966.0 (H) 3796.0 (H)     1515:  Pt has been ambulatory to the bathroom without c/o CP.  Will dose ASA and lasix for elevated troponin and BNP.  Dx and testing d/w pt and family.  Questions answered.  Verb understanding, agreeable to admit.  T/C to [facility] C[facility] Benton, case discussed, including:  HPI, pertinent PM/SHx, VS/PE, dx testing, ED course and treatment:  Agreeable to abmit, requests to call [facility] to consult, obtain  tele bed to team 2.   1525:  T/C to [facility] Dr. Mayford Knife, case discussed, including:  HPI, pertinent PM/SHx, VS/PE, dx testing, ED course and treatment:  Agreeable to consult.         Laray Anger, DO 04/25/12 1235

## 2012-04-25 DIAGNOSIS — N189 Chronic kidney disease, unspecified: Secondary | ICD-10-CM | POA: Diagnosis present

## 2012-04-25 DIAGNOSIS — R7989 Other specified abnormal findings of blood chemistry: Secondary | ICD-10-CM

## 2012-04-25 DIAGNOSIS — N289 Disorder of kidney and ureter, unspecified: Secondary | ICD-10-CM | POA: Diagnosis present

## 2012-04-25 DIAGNOSIS — N390 Urinary tract infection, site not specified: Secondary | ICD-10-CM | POA: Diagnosis present

## 2012-04-25 LAB — BASIC METABOLIC PANEL
BUN: 33 mg/dL — ABNORMAL HIGH (ref 6–23)
Chloride: 103 mEq/L (ref 96–112)
GFR calc Af Amer: 34 mL/min — ABNORMAL LOW (ref >90)
GFR calc non Af Amer: 30 mL/min — ABNORMAL LOW (ref >90)
Potassium: 3.5 mEq/L (ref 3.5–5.1)

## 2012-04-25 LAB — TROPONIN I
Troponin I: 0.96 ng/mL (ref <0.30)
Troponin I: 1.07 ng/mL (ref <0.30)

## 2012-04-25 LAB — CBC
Hemoglobin: 11.2 g/dL — ABNORMAL LOW (ref 12.0–15.0)
MCH: 31.5 pg (ref 26.0–34.0)
MCHC: 33 g/dL (ref 30.0–36.0)
MCV: 95.5 fL (ref 78.0–100.0)
Platelets: 222 10*3/uL (ref 150–400)
RBC: 3.55 MIL/uL — ABNORMAL LOW (ref 3.87–5.11)

## 2012-04-25 LAB — URINE CULTURE

## 2012-04-25 LAB — TSH: TSH: 1.398 u[IU]/mL (ref 0.350–4.500)

## 2012-04-25 MED ORDER — POTASSIUM CHLORIDE CRYS ER 20 MEQ PO TBCR
40.0000 meq | EXTENDED_RELEASE_TABLET | Freq: Once | ORAL | Status: AC
  Administered 2012-04-25: 40 meq via ORAL
  Filled 2012-04-25: qty 2

## 2012-04-25 NOTE — Progress Notes (Signed)
[facility] PROGRESS NOTE  [name] ZOX:[id number] DOB: Mar 08, 1920 DOA: 04/24/2012 PCP: Carollee Herter, MD  Assessment/Plan:  #1 acute on chronic diastolic CHF Questionable etiology. Clinical improvement. I/O. equal -2.05 L. Patient does have elevated troponins. 2-D echo is pending. Continue strict I.'s and O.'s. Daily weights. Continue Lasix 40 mg IV twice a day. Continue atenolol and aspirin. Cardiology is following and appreciate input and recommendations.  #2 atrial fibrillation Rate controlled on atenolol. TSH is within normal limits. Patient deemed not a anticoagulation candidate secondary to history of GI bleed while on Coumadin. Continue aspirin. Per cardiology.  #3 hypothyroidism TSH within normal limits at 1.398. Continue home dose Synthroid.  #4 probable UTI Urine cultures are pending. Continue empiric IV Rocephin.  #5 gastroesophageal reflux disease Continue Pepcid.  #6 elevated troponins Questionable etiology. Likely secondary to demand ischemia. 2-D echo is pending. Continue aspirin and atenolol. Per cardiology.  #7 arthritis Tylenol as needed.  #8 chronic renal insufficiency Stable. Follow closely with diuresis.  #9 depression Continue home regimen of Celexa.  #10 prophylaxis Pepcid for GI prophylaxis. TED hose for DVT prophylaxis.  Code Status: Full Family Communication: Updated patient and caregiver at bedside. Disposition Plan: Home when medically stable.   Consultants:  Cardiology: Dr. Armanda Magic 04/24/2012  Procedures:  Chest x-ray 04/24/2012  Antibiotics:  IV Rocephin 04/24/2012  HPI/Subjective: Patient states shortness of breath is improving since admission.  Objective: Filed Vitals:   04/24/12 2342 04/25/12 0220 04/25/12 0500 04/25/12 0900  BP: 121/47 99/62 112/62 107/55  Pulse: 75 81 85 75  Temp:  98.3 F (36.8 C) 97 F (36.1 C) 98.3 F (36.8 C)  TempSrc:  Oral Oral Oral  Resp:  18 18 20   Height:       Weight:   61.2 kg (134 lb 14.7 oz)   SpO2:  90% 95% 95%    Intake/Output Summary (Last 24 hours) at 04/25/12 1033 Last data filed at 04/25/12 0900  Gross per 24 hour  Intake    360 ml  Output   2451 ml  Net  -2091 ml   Filed Weights   04/24/12 1238 04/24/12 1809 04/25/12 0500  Weight: 63.05 kg (139 lb) 61.1 kg (134 lb 11.2 oz) 61.2 kg (134 lb 14.7 oz)    Exam:   General:  NAD  Cardiovascular: Irregularly irregular. No lower extremity edema.  Respiratory: Left basilar crackles. No wheezing, no rhonchi.  Abdomen: Soft, nontender, nondistended, positive bowel sounds.  Data Reviewed: Basic Metabolic Panel:  Lab 04/25/12 8119 04/24/12 1948 04/24/12 1332  NA 140 -- 138  K 3.5 -- 4.3  CL 103 -- 103  CO2 24 -- 21  GLUCOSE 89 -- 100*  BUN 33* -- 34*  CREATININE 1.49* -- 1.32*  CALCIUM 8.8 -- 9.2  MG -- 2.1 --  PHOS -- -- --   Liver Function Tests: No results found for this basename: AST:5,ALT:5,ALKPHOS:5,BILITOT:5,PROT:5,ALBUMIN:5 in the last 168 hours No results found for this basename: LIPASE:5,AMYLASE:5 in the last 168 hours No results found for this basename: AMMONIA:5 in the last 168 hours CBC:  Lab 04/25/12 0152 04/24/12 1332  WBC 11.2* 12.1*  NEUTROABS -- 8.1*  HGB 11.2* 11.9*  HCT 33.9* 36.8  MCV 95.5 95.6  PLT 222 229   Cardiac Enzymes:  Lab 04/25/12 0900 04/25/12 0153 04/24/12 1948 04/24/12 1331  CKTOTAL -- -- -- --  CKMB -- -- -- --  CKMBINDEX -- -- -- --  TROPONINI 1.07* 0.96* 1.25* 1.06*   BNP (last  3 results)  Basename 04/24/12 1331 09/28/11 0555 09/20/11 0500  PROBNP 3796.0* 5966.0* 5485.0*   CBG: No results found for this basename: GLUCAP:5 in the last 168 hours  No results found for this or any previous visit (from the past 240 hour(s)).   Studies: Dg Chest 2 View  04/24/2012  *RADIOLOGY REPORT*  Clinical Data: Short of breath.  History of CHF.  CHEST - 2 VIEW  Comparison: 09/29/2011.  Findings: Dual lead left subclavian cardiac  pacemaker. Cardiopericardial silhouette remains enlarged. Pulmonary vascular congestion is present with cephalization of pulmonary blood flow. Chronic bilateral apical interstitial changes are present. Hyperinflation compatible with emphysema. Chronic blunting of the costophrenic angles on the lateral view.  Aortic atherosclerosis. Exaggerated thoracic kyphosis.  No airspace disease.  No effusion. Left basilar atelectasis.  IMPRESSION: Cardiomegaly and pulmonary vascular congestion.   Original Report Authenticated By: Andreas Newport, M.D.     Scheduled Meds:    . amLODipine  5 mg Oral Daily  . aspirin EC  325 mg Oral Daily  . atenolol  25 mg Oral BID  . cefTRIAXone (ROCEPHIN)  IV  1 g Intravenous Q24H  . citalopram  20 mg Oral Daily  . famotidine  20 mg Oral BID  . furosemide  40 mg Intravenous BID  . lactose free nutrition  237 mL Oral TID WC  . levothyroxine  88 mcg Oral QAC breakfast  . potassium chloride  10 mEq Oral Daily  . sodium chloride  3 mL Intravenous Q12H   Continuous Infusions:   Principal Problem:  *CHF, acute on chronic Active Problems:  Atrial fibrillation  Diastolic heart failure  Hypothyroid  Arthritis  UTI (lower urinary tract infection)  Elevated troponin  Chronic renal insufficiency    Time spent: > 35 mins    [facility]  [facility] Pager [phone]. If 8PM-8AM, please contact night-coverage at [email or web address], password [facility] 04/25/2012, 10:33 AM  LOS: 1 day

## 2012-04-25 NOTE — Addendum Note (Signed)
Addended by: Ronnald Nian on: 04/25/2012 02:58 PM   Modules accepted: Orders

## 2012-04-25 NOTE — Progress Notes (Signed)
  Echocardiogram 2D Echocardiogram has been performed.  [name] 04/25/2012, 12:27 PM

## 2012-04-25 NOTE — Progress Notes (Signed)
Occupational Therapy Evaluation Patient Details Name: [name] MRN: [id number] DOB: July 26, 1919 Today's Date: 04/25/2012 Time: 7829-5621 OT Time Calculation (min): 18 min  OT Assessment / Plan / Recommendation Clinical Impression  78 yo with c/o SOB and DOE. PTA, pt lived alone with sitters from 9am - 2pm and from 7pm - 8am . Feel pt will be able to D/C home with 24/7 of sitters and HHOT to return pt to PLOF. Discussed option of arranging for sitters to assist 24/7 for 1-2 weeks until strength improves. Pt in agreement. Will follow acutely to facilitate D/C home with 24/7 S.     OT Assessment  Patient needs continued OT Services    Follow Up Recommendations  Home health OT    Barriers to Discharge None Need to assess if caregivers can provide 24/7 S initially  Equipment Recommendations  Tub/shower bench    Recommendations for Other Services    Frequency  Min 3X/week    Precautions / Restrictions Precautions Precautions: Fall   Pertinent Vitals/Pain No c/o pain    ADL  Eating/Feeding: Independent Where Assessed - Eating/Feeding: Chair Grooming: Set up;Supervision/safety Where Assessed - Grooming: Supported sitting Upper Body Bathing: Supervision/safety;Set up Where Assessed - Upper Body Bathing: Supported sitting Lower Body Bathing: Minimal assistance Where Assessed - Lower Body Bathing: Supported sit to stand Upper Body Dressing: Supervision/safety;Set up Where Assessed - Upper Body Dressing: Unsupported sitting Lower Body Dressing: Minimal assistance Where Assessed - Lower Body Dressing: Supported sit to stand Toilet Transfer: Hydrographic surveyor Method: Sit to stand;Stand pivot Acupuncturist: Materials engineer and Hygiene: Supervision/safety Where Assessed - Engineer, mining and Hygiene: Sit to stand from 3-in-1 or toilet Equipment Used: Gait belt Transfers/Ambulation Related to ADLs: min  guard ADL Comments: Limited primarily by weakness    OT Diagnosis: Generalized weakness  OT Problem List: Decreased strength;Decreased activity tolerance;Impaired balance (sitting and/or standing);Decreased knowledge of use of DME or AE;Cardiopulmonary status limiting activity OT Treatment Interventions: Self-care/ADL training;Therapeutic exercise;Energy conservation;DME and/or AE instruction;Therapeutic activities;Patient/family education;Balance training   OT Goals Acute Rehab OT Goals OT Goal Formulation: With patient Time For Goal Achievement: 04/25/12 Potential to Achieve Goals: Good ADL Goals Pt Will Perform Grooming: with supervision;Standing at sink;Unsupported;with cueing (comment type and amount) ADL Goal: Grooming - Progress: Goal set today Pt Will Perform Upper Body Dressing: with set-up;with supervision;Unsupported;Sitting, chair ADL Goal: Upper Body Dressing - Progress: Goal set today Pt Will Perform Lower Body Dressing: with supervision;with set-up;Sit to stand from chair;Unsupported;with cueing (comment type and amount) ADL Goal: Lower Body Dressing - Progress: Goal set today Pt Will Transfer to Toilet: with set-up;with supervision;Ambulation;with DME;3-in-1 ADL Goal: Toilet Transfer - Progress: Goal set today Pt Will Perform Toileting - Clothing Manipulation: with modified independence;Standing ADL Goal: Toileting - Clothing Manipulation - Progress: Goal set today Pt Will Perform Toileting - Hygiene: with modified independence;Standing at 3-in-1/toilet;Sit to stand from 3-in-1/toilet ADL Goal: Toileting - Hygiene - Progress: Goal set today Additional ADL Goal #1: Pt will complete funcitonal moiblity for ADL @ S level with W. ADL Goal: Additional Goal #1 - Progress: Goal set today  Visit Information  Last OT Received On: 04/25/12 Assistance Needed: +1    Subjective Data      Prior Functioning     Home Living Lives With: Alone Available Help at Discharge:  Personal care attendant;Available 24 hours/day Type of Home: House Home Access: Stairs to enter [employer] of Steps: 5 Entrance Stairs-Rails: Can reach both Home  Layout: One level Bathroom Shower/Tub: Engineer, manufacturing systems: Handicapped height Bathroom Accessibility: Yes How Accessible: Accessible via walker Home Adaptive Equipment: Bedside commode/3-in-1;Hand-held shower hose;Shower chair with back;Walker - rolling Additional Comments: 2 daughters live in [place] Prior Function Level of Independence: Independent with assistive device(s) Able to Take Stairs?: Yes Driving: No Vocation: Retired Comments: Has PCA from 9am - 2pm, then another PCA from 7 throughout the night Communication Communication: No difficulties Dominant Hand: Right         Vision/Perception     Cognition  Overall Cognitive Status: Appears within functional limits for tasks assessed/performed Arousal/Alertness: Awake/alert Orientation Level: Appears intact for tasks assessed Behavior During Session: [facility] for tasks performed    Extremity/Trunk Assessment Right Upper Extremity Assessment RUE ROM/Strength/Tone: WFL for tasks assessed RUE Sensation: WFL - Light Touch;WFL - Proprioception RUE Coordination: WFL - gross/fine motor Left Upper Extremity Assessment LUE ROM/Strength/Tone: Within functional levels LUE Sensation: WFL - Light Touch;WFL - Proprioception LUE Coordination: WFL - gross/fine motor Trunk Assessment Trunk Assessment: Normal     Mobility Bed Mobility Bed Mobility: Supine to Sit Supine to Sit: 5: Supervision Transfers Transfers: Sit to Stand;Stand to Sit Sit to Stand: 4: Min guard;With upper extremity assist;From chair/3-in-1 Stand to Sit: 4: Min guard;With upper extremity assist;To chair/3-in-1 Details for Transfer Assistance: good safety     Shoulder Instructions     Exercise     Balance Balance Balance Assessed: Yes (unsteady initially upon  standing)   End of Session OT - End of Session Equipment Utilized During Treatment: Gait belt Activity Tolerance: Patient limited by fatigue Patient left: in chair;with call bell/phone within reach;with family/visitor present;Other (comment) (PCA) Nurse Communication: Mobility status  GO     Rayma Hegg,HILLARY 04/25/2012, 5:43 PM [facility], OTR/L  [phone] 04/25/2012

## 2012-04-26 DIAGNOSIS — I214 Non-ST elevation (NSTEMI) myocardial infarction: Secondary | ICD-10-CM

## 2012-04-26 LAB — CBC
HCT: 39.7 % (ref 36.0–46.0)
Hemoglobin: 12.4 g/dL (ref 12.0–15.0)
MCH: 30.7 pg (ref 26.0–34.0)
MCV: 98.3 fL (ref 78.0–100.0)
RBC: 4.04 MIL/uL (ref 3.87–5.11)

## 2012-04-26 LAB — BASIC METABOLIC PANEL
CO2: 26 mEq/L (ref 19–32)
Chloride: 104 mEq/L (ref 96–112)
Glucose, Bld: 93 mg/dL (ref 70–99)
Potassium: 4 mEq/L (ref 3.5–5.1)
Sodium: 142 mEq/L (ref 135–145)

## 2012-04-26 LAB — PRO B NATRIURETIC PEPTIDE: Pro B Natriuretic peptide (BNP): 2559 pg/mL — ABNORMAL HIGH (ref 0–450)

## 2012-04-26 MED ORDER — ATENOLOL 12.5 MG HALF TABLET
12.5000 mg | ORAL_TABLET | Freq: Two times a day (BID) | ORAL | Status: AC
  Administered 2012-04-26: 12.5 mg via ORAL
  Filled 2012-04-26: qty 1

## 2012-04-26 MED ORDER — SPIRONOLACTONE 25 MG PO TABS
25.0000 mg | ORAL_TABLET | Freq: Every day | ORAL | Status: DC
  Administered 2012-04-26 – 2012-04-27 (×2): 25 mg via ORAL
  Filled 2012-04-26 (×2): qty 1

## 2012-04-26 MED ORDER — FAMOTIDINE 20 MG PO TABS
20.0000 mg | ORAL_TABLET | Freq: Every day | ORAL | Status: DC
Start: 2012-04-27 — End: 2012-04-27
  Administered 2012-04-27: 20 mg via ORAL
  Filled 2012-04-26: qty 1

## 2012-04-26 MED ORDER — SOTALOL HCL 80 MG PO TABS
80.0000 mg | ORAL_TABLET | Freq: Two times a day (BID) | ORAL | Status: DC
  Administered 2012-04-26: 80 mg via ORAL
  Filled 2012-04-26 (×2): qty 1

## 2012-04-26 MED ORDER — ATENOLOL 25 MG PO TABS
25.0000 mg | ORAL_TABLET | Freq: Two times a day (BID) | ORAL | Status: DC
  Administered 2012-04-26 – 2012-04-27 (×3): 25 mg via ORAL
  Filled 2012-04-26 (×4): qty 1

## 2012-04-26 MED ORDER — FUROSEMIDE 40 MG PO TABS
40.0000 mg | ORAL_TABLET | Freq: Two times a day (BID) | ORAL | Status: DC
  Administered 2012-04-26: 40 mg via ORAL
  Filled 2012-04-26 (×4): qty 1

## 2012-04-26 NOTE — Evaluation (Signed)
Physical Therapy Evaluation Patient Details Name: [name] MRN: [id number] DOB: Oct 15, 1919 Today's Date: 04/26/2012 Time: 2130-8657 PT Time Calculation (min): 26 min  PT Assessment / Plan / Recommendation Clinical Impression  Pt admitted with SOB and CHF with elevated troponin. Pt has caregivers available 24hrs/day and is functioning at baseline mobility with sats 94-96% on RA with ambulation and stairs and HR 96-99 throughout. Pt and caregiver agreeable to increasing mobility acutely without further physical therapy needs. Pt encouraged to ambulate with supervision and RW at least 3x/day. All education complete.     PT Assessment  Patent does not need any further PT services    Follow Up Recommendations  No PT follow up    Does the patient have the potential to tolerate intense rehabilitation      Barriers to Discharge        Equipment Recommendations  None recommended by PT    Recommendations for Other Services     Frequency      Precautions / Restrictions Precautions Precautions: Fall   Pertinent Vitals/Pain No pain      Mobility  Bed Mobility Bed Mobility: Not assessed Transfers Transfers: Sit to Stand;Stand to Sit Sit to Stand: 6: Modified independent (Device/Increase time);From chair/3-in-1;From toilet;With armrests Stand to Sit: 6: Modified independent (Device/Increase time);To toilet;To chair/3-in-1;With armrests Ambulation/Gait Ambulation/Gait Assistance: 5: Supervision Ambulation Distance (Feet): 450 Feet Assistive device: Rolling walker Gait Pattern: Step-through pattern;Decreased stride length Gait velocity: decreased Stairs: Yes Stairs Assistance: 5: Supervision Stairs Assistance Details (indicate cue type and reason): supervision for safety Stair Management Technique: One rail Right Number of Stairs: 5     Shoulder Instructions     Exercises     PT Diagnosis:    PT Problem List:   PT Treatment Interventions:     PT Goals     Visit Information  Last PT Received On: 04/26/12 Assistance Needed: +1    Subjective Data  Subjective: I feel weak Patient Stated Goal: return home with caregivers   Prior Functioning  Home Living Lives With: Alone Available Help at Discharge: Personal care attendant;Available 24 hours/day Type of Home: House Home Access: Stairs to enter [employer] of Steps: 5 Entrance Stairs-Rails: Right Home Layout: One level Bathroom Shower/Tub: Engineer, manufacturing systems: Handicapped height Bathroom Accessibility: Yes How Accessible: Accessible via walker Home Adaptive Equipment: Bedside commode/3-in-1;Walker - rolling;Hand-held shower hose;Shower chair with back Prior Function Level of Independence: Independent with assistive device(s) Able to Take Stairs?: Yes Driving: No Vocation: Retired Comments: aide from 9-2 and another from 7pm through the night and feel 24 hrs can be arranged by her dgtr Communication Communication: No difficulties    Cognition  Overall Cognitive Status: Appears within functional limits for tasks assessed/performed Arousal/Alertness: Awake/alert Orientation Level: Appears intact for tasks assessed Behavior During Session: [facility] for tasks performed    Extremity/Trunk Assessment Right Lower Extremity Assessment RLE ROM/Strength/Tone: Within functional levels Left Lower Extremity Assessment LLE ROM/Strength/Tone: Within functional levels Trunk Assessment Trunk Assessment: Normal   Balance    End of Session PT - End of Session Equipment Utilized During Treatment: Gait belt Activity Tolerance: Patient tolerated treatment well Patient left: in chair;with call bell/phone within reach;with family/visitor present Nurse Communication: Mobility status       Delorse Lek 04/26/2012, 3:34 PM  Delaney Meigs, PT [phone]

## 2012-04-26 NOTE — Progress Notes (Signed)
[facility] PROGRESS NOTE  [name]:[id number] DOB: 1920-02-09 DOA: 04/24/2012 PCP: Carollee Herter, MD  Assessment/Plan:  #1 acute on chronic diastolic CHF Questionable etiology. Clinical improvement. I/O. equal -3.4 L during this hospitalization. Patient does have elevated troponins. 2-D echo with EF of 55-60%. Possible hypokinesis of the midinferior myocardium.. Continue strict I.'s and O.'s. Daily weights.  Lasix has been changed to oral per cardiology. Spironolactone as an added to patient's regimen. Continue atenolol and aspirin. Cardiology is following and appreciate input and recommendations.  #2 atrial fibrillation/atrial flutter  Patient going in and out of A. Fib. Atenolol dose has been increased per cardiology. TSH is within normal limits. Patient deemed not a anticoagulation candidate secondary to history of GI bleed while on Coumadin. Continue aspirin. Per cardiology.  #3 hypothyroidism TSH within normal limits at 1.398. Continue home dose Synthroid.  #4 probable UTI Urine cultures were 25,000 colonies of multiple bacterial morphotypes. Patient is asymptomatic. Today will be day #3 of IV antibiotics. Will D/C Rocephin. are pending.   #5 gastroesophageal reflux disease Continue Pepcid.  #6 elevated troponins/NSTEMI type 2  Questionable etiology. Likely secondary to demand ischemia. 2-D echo with EF of 55-60%. Possible hypokinesis of the midinferior myocardium. Continue atenolol and Norvasc and aspirin. Per cardiology no ischemic evaluation planned. Continue medical management. Per cardiology.  #7 arthritis Tylenol as needed.  #8 chronic renal insufficiency Stable. Follow closely with diuresis.  #9 depression Continue home regimen of Celexa.  #10 prophylaxis Pepcid for GI prophylaxis. TED hose for DVT prophylaxis.  Code Status: Full Family Communication: Updated patient and daughter at bedside. Disposition Plan: Home when medically  stable.   Consultants:  Cardiology: Dr. Armanda Magic 04/24/2012  Procedures:  Chest x-ray 04/24/2012  2 D echo 04/25/2012  Antibiotics:  IV Rocephin 04/24/2012---> 04/26/12  HPI/Subjective: Patient states shortness of breath is improving since admission. Patient states he feels better and hoping to go home tomorrow. A. fib and V. paced on telemetry  Objective: Filed Vitals:   04/25/12 2029 04/26/12 0530 04/26/12 1155 04/26/12 1324  BP: 126/57 115/54 119/53 118/43  Pulse: 87 87 88 83  Temp: 97.8 F (36.6 C) 98.1 F (36.7 C)  97.7 F (36.5 C)  TempSrc: Oral Oral  Oral  Resp: 20 20  20   Height:      Weight:  60.4 kg (133 lb 2.5 oz)    SpO2: 95% 97%  100%    Intake/Output Summary (Last 24 hours) at 04/26/12 1558 Last data filed at 04/26/12 1300  Gross per 24 hour  Intake    840 ml  Output   1300 ml  Net   -460 ml   Filed Weights   04/24/12 1809 04/25/12 0500 04/26/12 0530  Weight: 61.1 kg (134 lb 11.2 oz) 61.2 kg (134 lb 14.7 oz) 60.4 kg (133 lb 2.5 oz)    Exam:   General:  NAD  Cardiovascular: Irregularly irregular. No lower extremity edema.  Respiratory: Left basilar crackles. No wheezing, no rhonchi.  Abdomen: Soft, nontender, nondistended, positive bowel sounds.  Data Reviewed: Basic Metabolic Panel:  Lab 04/26/12 8119 04/25/12 0152 04/24/12 1948 04/24/12 1332  NA 142 140 -- 138  K 4.0 3.5 -- 4.3  CL 104 103 -- 103  CO2 26 24 -- 21  GLUCOSE 93 89 -- 100*  BUN 36* 33* -- 34*  CREATININE 1.49* 1.49* -- 1.32*  CALCIUM 9.4 8.8 -- 9.2  MG -- -- 2.1 --  PHOS -- -- -- --   Liver  Function Tests: No results found for this basename: AST:5,ALT:5,ALKPHOS:5,BILITOT:5,PROT:5,ALBUMIN:5 in the last 168 hours No results found for this basename: LIPASE:5,AMYLASE:5 in the last 168 hours No results found for this basename: AMMONIA:5 in the last 168 hours CBC:  Lab 04/26/12 0430 04/25/12 0152 04/24/12 1332  WBC 12.0* 11.2* 12.1*  NEUTROABS -- -- 8.1*  HGB  12.4 11.2* 11.9*  HCT 39.7 33.9* 36.8  MCV 98.3 95.5 95.6  PLT 267 222 229   Cardiac Enzymes:  Lab 04/25/12 0900 04/25/12 0153 04/24/12 1948 04/24/12 1331  CKTOTAL -- -- -- --  CKMB -- -- -- --  CKMBINDEX -- -- -- --  TROPONINI 1.07* 0.96* 1.25* 1.06*   BNP (last 3 results)  Basename 04/26/12 0430 04/24/12 1331 09/28/11 0555  PROBNP 2559.0* 3796.0* 5966.0*   CBG: No results found for this basename: GLUCAP:5 in the last 168 hours  Recent Results (from the past 240 hour(s))  URINE CULTURE     Status: Normal   Collection Time   04/24/12  1:30 PM      Component Value Range Status Comment   Specimen Description URINE, RANDOM   Final    Special Requests NONE   Final    Culture  Setup Time 04/24/2012 21:43   Final    Colony Count 25,000 COLONIES/ML   Final    Culture     Final    Value: Multiple bacterial morphotypes present, none predominant. Suggest appropriate recollection if clinically indicated.   Report Status 04/25/2012 FINAL   Final      Studies: No results found.  Scheduled Meds:    . amLODipine  5 mg Oral Daily  . aspirin EC  325 mg Oral Daily  . atenolol  25 mg Oral BID  . cefTRIAXone (ROCEPHIN)  IV  1 g Intravenous Q24H  . citalopram  20 mg Oral Daily  . famotidine  20 mg Oral Daily  . furosemide  40 mg Oral BID  . lactose free nutrition  237 mL Oral TID WC  . levothyroxine  88 mcg Oral QAC breakfast  . potassium chloride  10 mEq Oral Daily  . sodium chloride  3 mL Intravenous Q12H  . spironolactone  25 mg Oral Daily   Continuous Infusions:   Principal Problem:  *CHF, acute on chronic Active Problems:  Atrial fibrillation  Diastolic heart failure  Hypothyroid  Arthritis  UTI (lower urinary tract infection)  Elevated troponin  Chronic renal insufficiency  NSTEMI (non-ST elevated myocardial infarction)    Time spent: > 35 mins    [facility]  [facility] Pager [phone]. If 8PM-8AM, please contact night-coverage at  [email or web address], password [facility] 04/26/2012, 3:58 PM  LOS: 2 days

## 2012-04-26 NOTE — Progress Notes (Signed)
Patient Name: [name] Date of Encounter: 04/26/2012    SUBJECTIVE: Breathing is improved and the patient feels weak. I saw her 1 day prior to admission and felt she was developing CHF and we increased lasix but apparently no aggressively enough. Has had some chest [pain and troponins are elevated.  TELEMETRY:   A fib with V pacing.: Filed Vitals:   04/25/12 1131 04/25/12 1400 04/25/12 2029 04/26/12 0530  BP: 132/50 110/50 126/57 115/54  Pulse: 85 76 87 87  Temp: 98.4 F (36.9 C) 98 F (36.7 C) 97.8 F (36.6 C) 98.1 F (36.7 C)  TempSrc: Oral Oral Oral Oral  Resp: 20 20 20 20   Height:      Weight:    60.4 kg (133 lb 2.5 oz)  SpO2: 97% 96% 95% 97%    Intake/Output Summary (Last 24 hours) at 04/26/12 0837 Last data filed at 04/26/12 0810  Gross per 24 hour  Intake   1203 ml  Output   2201 ml  Net   -998 ml   I/O: net since admission - 3050  LABS: Basic Metabolic Panel:  Basename 04/26/12 0430 04/25/12 0152 04/24/12 1948  NA 142 140 --  K 4.0 3.5 --  CL 104 103 --  CO2 26 24 --  GLUCOSE 93 89 --  BUN 36* 33* --  CREATININE 1.49* 1.49* --  CALCIUM 9.4 8.8 --  MG -- -- 2.1  PHOS -- -- --   CBC:  Basename 04/26/12 0430 04/25/12 0152 04/24/12 1332  WBC 12.0* 11.2* --  NEUTROABS -- -- 8.1*  HGB 12.4 11.2* --  HCT 39.7 33.9* --  MCV 98.3 95.5 --  PLT 267 222 --   Cardiac Enzymes:  Basename 04/25/12 0900 04/25/12 0153 04/24/12 1948  CKTOTAL -- -- --  CKMB -- -- --  CKMBINDEX -- -- --  TROPONINI 1.07* 0.96* 1.25*   Radiology/Studies:  CXR with pulm congestion on 12/17  ECHO: 04/25/2012 ------------------------------------------------------------ LV EF: 55% - 60%  ------------------------------------------------------------ Indications: CHF - 428.0.  ------------------------------------------------------------ History: PMH: Atrial fibrillation.  ------------------------------------------------------------ Study Conclusions  - Left  ventricle: The cavity size was normal. There was mild focal basal hypertrophy of the septum. Systolic function was normal. The estimated ejection fraction was in the range of 55% to 60%. Possible hypokinesis of the mid inferior myocardium. (Seen best on short axis view). Features are consistent with a pseudonormal left ventricular filling pattern, with concomitant abnormal relaxation and increased filling pressure (grade 2 diastolic dysfunction). - Mitral valve: Calcified annulus. Mildly thickened, mildly calcified leaflets . Mild regurgitation. - Pulmonary arteries: Systolic pressure was mildly to moderately increased. PA peak pressure: 44mm Hg (S).  ECG: since admission, the tracings have shown a pacing. Prior tracings even as OP revealed a fib abd flutter Physical Exam: Blood pressure 115/54, pulse 87, temperature 98.1 F (36.7 C), temperature source Oral, resp. rate 20, height 5\' 3"  (1.6 m), weight 60.4 kg (133 lb 2.5 oz), SpO2 97.00%. Weight change: -2.65 kg (-5 lb 13.5 oz)   Basilar rales and decreased breath sounds. Cardiac rhythm is regular. No murmur Extremities with no edema.   ASSESSMENT:  1. Acute on chronic diastolic heart failure, improved with iv diuresis. EF 60%  2. Acute kidney injury  3. NSTEMI, presumed type II, but can't exclude CAD given abrupt onset.  4. Atrial fib/flutter have spontaneously revert to a paced rhythm, new this admission   Plan:  1. Switch to oral; diuretic regimen 40 mg BID 2. Add spironolactone  to regimen, 25 mg daily 3. PT to ambulate. 4. No ischemic evaluation planned 5. Sotalol 80 mg BID to help maintain NSR 5. Home soon.  Selinda Eon 04/26/2012, 8:37 AM

## 2012-04-27 LAB — BASIC METABOLIC PANEL
CO2: 26 mEq/L (ref 19–32)
CO2: 25 mEq/L (ref 19–32)
Calcium: 9 mg/dL (ref 8.4–10.5)
Chloride: 104 mEq/L (ref 96–112)
Creatinine, Ser: 1.39 mg/dL — ABNORMAL HIGH (ref 0.50–1.10)
Glucose, Bld: 95 mg/dL (ref 70–99)
Glucose, Bld: 130 mg/dL — ABNORMAL HIGH (ref 70–99)
Sodium: 140 mEq/L (ref 135–145)

## 2012-04-27 MED ORDER — FAMOTIDINE 20 MG PO TABS: 20.0000 mg | ORAL_TABLET | Freq: Every day | ORAL | Status: DC

## 2012-04-27 MED ORDER — SPIRONOLACTONE 25 MG PO TABS: 25.0000 mg | ORAL_TABLET | Freq: Every day | ORAL | Status: DC

## 2012-04-27 MED ORDER — ATENOLOL 25 MG PO TABS: 25.0000 mg | ORAL_TABLET | Freq: Two times a day (BID) | ORAL | Status: DC

## 2012-04-27 MED ORDER — FUROSEMIDE 80 MG PO TABS: 80.0000 mg | ORAL_TABLET | Freq: Every day | ORAL | Status: DC

## 2012-04-27 MED ORDER — FUROSEMIDE 80 MG PO TABS
80.0000 mg | ORAL_TABLET | Freq: Every day | ORAL | Status: DC
  Administered 2012-04-27: 80 mg via ORAL
  Filled 2012-04-27: qty 1

## 2012-04-27 NOTE — Plan of Care (Signed)
Problem: Phase I Progression Outcomes Goal: EF % per last Echo/documented,Core Reminder form on chart Outcome: Completed/Met Date Met:  04/27/12 EF per last echo on 04/25/12  was 55%-60%.

## 2012-04-27 NOTE — Progress Notes (Signed)
Patient Name: [name] Date of Encounter: 04/27/2012    SUBJECTIVE: She had a good night and denies chest pain and dyspnea this morning.  TELEMETRY:   NSR: Filed Vitals:   04/26/12 1324 04/26/12 1617 04/26/12 2052 04/27/12 0446  BP: 118/43 122/74 108/64 127/45  Pulse: 83 85 83 83  Temp: 97.7 F (36.5 C)  98.3 F (36.8 C) 97.5 F (36.4 C)  TempSrc: Oral  Oral Oral  Resp: 20  20 19   Height:      Weight:    60.419 kg (133 lb 3.2 oz)  SpO2: 100%  90% 99%    Intake/Output Summary (Last 24 hours) at 04/27/12 0710 Last data filed at 04/27/12 0451  Gross per 24 hour  Intake    963 ml  Output    675 ml  Net    288 ml   NET I/O: since admission negative 3120 LABS: Basic Metabolic Panel:  Basename 04/26/12 0430 04/25/12 0152 04/24/12 1948  NA 142 140 --  K 4.0 3.5 --  CL 104 103 --  CO2 26 24 --  GLUCOSE 93 89 --  BUN 36* 33* --  CREATININE 1.49* 1.49* --  CALCIUM 9.4 8.8 --  MG -- -- 2.1  PHOS -- -- --   CBC:  Basename 04/26/12 0430 04/25/12 0152 04/24/12 1332  WBC 12.0* 11.2* --  NEUTROABS -- -- 8.1*  HGB 12.4 11.2* --  HCT 39.7 33.9* --  MCV 98.3 95.5 --  PLT 267 222 --   Cardiac Enzymes:  Basename 04/25/12 0900 04/25/12 0153 04/24/12 1948  CKTOTAL -- -- --  CKMB -- -- --  CKMBINDEX -- -- --  TROPONINI 1.07* 0.96* 1.25*   Radiology/Studies:  No new data  Physical Exam: Blood pressure 127/45, pulse 83, temperature 97.5 F (36.4 C), temperature source Oral, resp. rate 19, height 5\' 3"  (1.6 m), weight 60.419 kg (133 lb 3.2 oz), SpO2 99.00%. Weight change: 0.019 kg (0.7 oz)   Basilar decreased breath sounds.  Regular rhythm. No murmur or gallop.  No edema  ASSESSMENT:  1. Acute on chronic diastolic heart failure, improved but not much urine output on BID lasix 40mg   2. Acute on chronic kidney disease, with BMET this AM pending  3. Still in NSR but unable to use Sotalol because of renal impairment( thanks to pharmacy for bringing her renal  function to my attention)  4. Elderly and frail with poor prognosis    Plan:  1. Change lasix to 80 mg daily 2. Spironolactone 25 mg daily 3. Home today 4. F/U with me 5-7 days. I will arrnage  Selinda Eon 04/27/2012, 7:10 AM

## 2012-04-27 NOTE — Progress Notes (Signed)
Utilization review completed.  

## 2012-04-27 NOTE — Discharge Summary (Signed)
Physician Discharge Summary  [name] UJW:[id number] DOB: 1919-11-28 DOA: 04/24/2012  PCP: Carollee Herter, MD  Admit date: 04/24/2012 Discharge date: 04/27/2012  Time spent: 65 minutes  Recommendations for Outpatient Follow-up:  1. Patient is to followup with her cardiologist, Dr. Verdis Prime on 04/30/2012 at 10:15 AM. On followup a basic metabolic profile will need to be checked to followup on electrolytes and renal function. 2. Patient is to followup with PCP 2 weeks post discharge.  Discharge Diagnoses:  Principal Problem:  *CHF, acute on chronic Active Problems:  Atrial fibrillation  Diastolic heart failure  Hypothyroid  Arthritis  UTI (lower urinary tract infection)  Elevated troponin  Chronic renal insufficiency  NSTEMI (non-ST elevated myocardial infarction)   Discharge Condition: Stable and improved  Diet recommendation: Heart healthy  Filed Weights   04/25/12 0500 04/26/12 0530 04/27/12 0446  Weight: 61.2 kg (134 lb 14.7 oz) 60.4 kg (133 lb 2.5 oz) 60.419 kg (133 lb 3.2 oz)    History of present illness:  With history of hypothyroid, diastolic HF and Atrial fibrillation presenting with 2 day complaints of worsening SOB and DOE. Reportedly patient used to be on amiodarone but family members feel that patient's lungs may have been affected as a side effect of being on amiodarone and patient was switched to B blocker and digoxin. Reports that digoxin discontinued ~ 2 weeks ago. Was seen by cardiology yesterday 04/23/12 and her lasix was recently increased from 40 mg of lasix to 60 mg of lasix but this change did not improve patient's condition and she presents today for further evaluation. Patient mentions that laying flat makes her SOB worse, denies any current chest discomfort, reportedly had transient chest discomfort this am but was unable to characterize it and when daughter brings it up pt responds, "I forgot."  While in the ED patient received aspirin  325 mg po and lasix 40 mg IV x 1. Was also started on normal saline at 75 cc/hr which I discontinued. BNP was obtained which showed 3796 and a troponin of 1.06.  Given hypoxia we were asked to further evaluate patient for further admission orders and evaluation.  Pt denies any cough, fever   Hospital Course:  #1 acute on chronic diastolic CHF  Patient was admitted with worsening shortness of breath at rest and on exertion. BNP which was obtained on admission was elevated at 3796 and initial troponin was elevated at 1.06. Patient was admitted to telemetry and was being treated for acute on chronic diastolic CHF. Patient denied any chest pain. Patient was placed on IV Lasix and maintained on a home dose of beta blocker and also aspirin. Cardiac enzymes were cycled were elevated however started to trend back down. Cardiology consultation was obtained and patient was seen in consultation by Dr. Armanda Magic on 04/24/2012. 2-D echo was ordered and patient was placed on IV Lasix and followED. Patient improved clinically during the hospitalization and diuresis well.  2-D echo was done with EF of 55-60%. Possible hypokinesis of the midinferior myocardium. Spironolactone was added to patient's regimen and she was followed. Patient was subsequently transitioned from IV Lasix to oral Lasix and her home dose increased to 80 mg daily. Patient improved clinically and will be discharged in stable and improved condition. Patient will followup with her cardiologist one week post discharge and patient be discharged home in stable and improved condition. #2 atrial fibrillation/atrial flutter  During the hospitalization patient will go in and out of atrial fibrillation. Troponins which was  cycled came back elevated which was felt to be secondary to a demand ischemia versus type II. Patient was maintained on atenolol at 25 mg twice daily. TSH which was done was within normal limits. 2-D echo was done that showed EF of 55-60%  with possible hypokinesis of the midinferior myocardium. Patient's rate was controlled and she was maintained on aspirin. He was also followed by cardiology during the hospitalization. Initially sotalol was recommended however due to patient's renal impairment cardiology was unable to use sotalol in this patient to maintain her rhythm. Patient remained in normal sinus rhythm and will be discharged home on atenolol for rate control and aspirin. Patient will followup with her cardiologist as outpatient one week post discharge.  #3 hypothyroidism  TSH within normal limits at 1.398. Patient was maintained on her home dose Synthroid.  #4 probable UTI  On admission urinalysis which was done was worrisome for a UTI. Patient was initially placed empirically on IV Rocephin while urine cultures are pending. Urine cultures grew out 25,000 colonies of multiple bacterial morphotypes. Patient had received 3 days of IV Rocephin which was deemed adequate as patient was also asymptomatic. Patient does not need any further antibiotics.  #5 elevated troponins/NSTEMI type 2  During the hospitalization cardiac enzymes which was cycled did have elevated troponins. Likely secondary to demand ischemia. 2-D echo with EF of 55-60%. Possible hypokinesis of the midinferior myocardium. Patient was maintained and aspirin as well as Lasix. Spironolactone was added to her regimen per cardiology. Per cardiology no further ischemic evaluation was planned during this hospitalization. Patient will followup with cardiology as outpatient.   The rest of patient's chronic medical issues remained stable throughout the hospitalization and patient be discharged in stable and improved condition.      Procedures: Chest x-ray 04/24/2012  2 D echo 04/25/2012     Consultations:  Cardiology: Dr. Carolanne Grumbling 04/24/2012  Discharge Exam: Filed Vitals:   04/26/12 1324 04/26/12 1617 04/26/12 2052 04/27/12 0446  BP: 118/43 122/74 108/64  127/45  Pulse: 83 85 83 83  Temp: 97.7 F (36.5 C)  98.3 F (36.8 C) 97.5 F (36.4 C)  TempSrc: Oral  Oral Oral  Resp: 20  20 19   Height:      Weight:    60.419 kg (133 lb 3.2 oz)  SpO2: 100%  90% 99%    General: NAD Cardiovascular: RRR Respiratory: Decreased breath sounds in the bases otherwise CTAB  Discharge Instructions  Discharge Orders    Future Orders Please Complete By Expires   Diet - low sodium heart healthy      Increase activity slowly      Discharge instructions      Comments:   Followup with Dr. Verdis Prime in one week. Follow up with Carollee Herter, MD in 2 weeks       Medication List     As of 04/27/2012  9:10 AM    TAKE these medications         acetaminophen 650 MG CR tablet   Commonly known as: TYLENOL   Take 650-1,300 mg by mouth 2 (two) times daily. Takes 650mg  in the am & 1300mg  at bedtime      amLODipine 5 MG tablet   Commonly known as: NORVASC   Take 5 mg by mouth daily.      aspirin 81 MG tablet   Take 81 mg by mouth daily.      atenolol 25 MG tablet   Commonly known as: TENORMIN  Take 1 tablet (25 mg total) by mouth 2 (two) times daily.      citalopram 20 MG tablet   Commonly known as: CELEXA   Take 1 tablet (20 mg total) by mouth daily.      famotidine 20 MG tablet   Commonly known as: PEPCID   Take 1 tablet (20 mg total) by mouth daily.      furosemide 80 MG tablet   Commonly known as: LASIX   Take 1 tablet (80 mg total) by mouth daily.      lactose free nutrition Liqd   Take 237 mLs by mouth daily.      levothyroxine 88 MCG tablet   Commonly known as: SYNTHROID, LEVOTHROID   Take 88 mcg by mouth daily.      multivitamin with minerals Tabs   Take 1 tablet by mouth daily.      potassium chloride 10 MEQ tablet   Commonly known as: K-DUR,KLOR-CON   Take 10 mEq by mouth daily.      spironolactone 25 MG tablet   Commonly known as: ALDACTONE   Take 1 tablet (25 mg total) by mouth daily.      VITAMIN B-12 PO    Take 1 tablet by mouth daily.           Follow-up Information    Follow up with Lesleigh Noe, MD. On 04/30/2012. (CF, at 10:15 AM)    Contact information:   [address] [place] [address] [phone]       Follow up with Carollee Herter, MD. Schedule an appointment as soon as possible for a visit in 2 weeks.   Contact information:   [address] [place] [address] [phone]           The results of significant diagnostics from this hospitalization (including imaging, microbiology, ancillary and laboratory) are listed below for reference.    Significant Diagnostic Studies: Dg Chest 2 View  04/24/2012  *RADIOLOGY REPORT*  Clinical Data: Short of breath.  History of CHF.  CHEST - 2 VIEW  Comparison: 09/29/2011.  Findings: Dual lead left subclavian cardiac pacemaker. Cardiopericardial silhouette remains enlarged. Pulmonary vascular congestion is present with cephalization of pulmonary blood flow. Chronic bilateral apical interstitial changes are present. Hyperinflation compatible with emphysema. Chronic blunting of the costophrenic angles on the lateral view.  Aortic atherosclerosis. Exaggerated thoracic kyphosis.  No airspace disease.  No effusion. Left basilar atelectasis.  IMPRESSION: Cardiomegaly and pulmonary vascular congestion.   Original Report Authenticated By: Andreas Newport, M.D.     Microbiology: Recent Results (from the past 240 hour(s))  URINE CULTURE     Status: Normal   Collection Time   04/24/12  1:30 PM      Component Value Range Status Comment   Specimen Description URINE, RANDOM   Final    Special Requests NONE   Final    Culture  Setup Time 04/24/2012 21:43   Final    Colony Count 25,000 COLONIES/ML   Final    Culture     Final    Value: Multiple bacterial morphotypes present, none predominant. Suggest appropriate recollection if clinically indicated.   Report Status 04/25/2012 FINAL   Final       Labs: Basic Metabolic Panel:  Lab 04/27/12 1324 04/26/12 0430 04/25/12 0152 04/24/12 1948 04/24/12 1332  NA 141 142 140 -- 138  K 3.8 4.0 3.5 -- 4.3  CL 104 104 103 -- 103  CO2 26 26 24  --  21  GLUCOSE 95 93 89 -- 100*  BUN 34* 36* 33* -- 34*  CREATININE 1.39* 1.49* 1.49* -- 1.32*  CALCIUM 8.9 9.4 8.8 -- 9.2  MG -- -- -- 2.1 --  PHOS -- -- -- -- --   Liver Function Tests: No results found for this basename: AST:5,ALT:5,ALKPHOS:5,BILITOT:5,PROT:5,ALBUMIN:5 in the last 168 hours No results found for this basename: LIPASE:5,AMYLASE:5 in the last 168 hours No results found for this basename: AMMONIA:5 in the last 168 hours CBC:  Lab 04/26/12 0430 04/25/12 0152 04/24/12 1332  WBC 12.0* 11.2* 12.1*  NEUTROABS -- -- 8.1*  HGB 12.4 11.2* 11.9*  HCT 39.7 33.9* 36.8  MCV 98.3 95.5 95.6  PLT 267 222 229   Cardiac Enzymes:  Lab 04/25/12 0900 04/25/12 0153 04/24/12 1948 04/24/12 1331  CKTOTAL -- -- -- --  CKMB -- -- -- --  CKMBINDEX -- -- -- --  TROPONINI 1.07* 0.96* 1.25* 1.06*   BNP: BNP (last 3 results)  Basename 04/26/12 0430 04/24/12 1331 09/28/11 0555  PROBNP 2559.0* 3796.0* 5966.0*   CBG: No results found for this basename: GLUCAP:5 in the last 168 hours     Signed:  Shewanda Sharpe  [facility] 04/27/2012, 9:10 AM

## 2012-04-27 NOTE — Care Management Note (Signed)
    Page 1 of 2   04/27/2012     10:52:56 AM   CARE MANAGEMENT NOTE 04/27/2012  Patient:  [name], [name]   Account Number:  [id number]  Date Initiated:  04/25/2012  Documentation initiated by:  ROYAL,CHERYL  Subjective/Objective Assessment:   Referral for CHF [facility] program, however no orders at this time.     Action/Plan:   Case management following for orders for [facility] needs.   Anticipated DC Date:  04/27/2012   Anticipated DC Plan:  HOME W HOME HEALTH SERVICES      DC Planning Services  CM consult      [facility] Choice  HOME HEALTH   Choice offered to / List presented to:  C-1 Patient        HH arranged  HH-1 RN  HH-10 DISEASE MANAGEMENT      HH agency  [facility]   Status of service:  Completed, signed off Medicare Important Message given?   (If response is "NO", the following Medicare IM given date fields will be blank) Date Medicare IM given:   Date Additional Medicare IM given:    Discharge Disposition:  HOME W HOME HEALTH SERVICES  Per UR Regulation:  Reviewed for med. necessity/level of care/duration of stay  If discussed at Long Length of Stay Meetings, dates discussed:    Comments:  04/27/12- 1045- Donn Pierini RN, BSN [phone] Pt for d/c today, orders for HH-RN for CHF management- spoke with pt and daughter in room- per conversation pt has home O2 with [facility]- and has had HH in past with [facility]- would like to use them again for [facility] services and is agreeable to [facility] f/u - referral called to Lupita Leash with [facility] for HH-RN- CHF management- services to begin within 24-48 hr post discharge.

## 2012-05-14 ENCOUNTER — Encounter: Payer: Self-pay | Admitting: Family Medicine

## 2012-05-14 ENCOUNTER — Ambulatory Visit (INDEPENDENT_AMBULATORY_CARE_PROVIDER_SITE_OTHER): Payer: MEDICARE | Admitting: Family Medicine

## 2012-05-14 VITALS — BP 110/70 | HR 75 | Wt 134.0 lb

## 2012-05-14 DIAGNOSIS — N390 Urinary tract infection, site not specified: Secondary | ICD-10-CM

## 2012-05-14 DIAGNOSIS — I509 Heart failure, unspecified: Secondary | ICD-10-CM

## 2012-05-14 DIAGNOSIS — N189 Chronic kidney disease, unspecified: Secondary | ICD-10-CM

## 2012-05-14 NOTE — Patient Instructions (Signed)
Cut back on the Celexa to 10 mg for one or 2 weeks and adjust the spironolactone based on clinical response and weight

## 2012-05-14 NOTE — Progress Notes (Signed)
  Subjective:    Patient ID: [name], female    DOB: 1920-04-27, 79 y.o.   MRN: [id number]  HPI She is here for recheck. She was recently hospitalized and treated for acute on chronic CHF. She is being followed by cardiology. Blood work was drawn today for followup on this. Her daughter is following her closely and has a good handle on her in terms of her weight and monitoring her medications especially Spironolactone. She continues to complain of fatigue. She also needs followup on her urinary tract infection. She has had difficulty with this in the past.   Review of Systems     Objective:   Physical Exam Alert and in no distress otherwise not examined. Urine microscopic did show white cells as well as being slightly contaminated       Assessment & Plan:   1. CHF, acute on chronic   2. UTI (lower urinary tract infection)   3. Chronic renal insufficiency    we have decided to stop the Celexa by tapering to see if this will help with her overall energy. Her daughter will be in control this. She will also weigh her daily in monitors prolactin based on this. We'll do a culture on her urine.

## 2012-05-15 LAB — POCT URINALYSIS DIPSTICK
Ketones, UA: NEGATIVE
Protein, UA: NEGATIVE
Urobilinogen, UA: NEGATIVE

## 2012-05-15 NOTE — Addendum Note (Signed)
Addended by: Lavell Islam on: 05/15/2012 11:23 AM   Modules accepted: Orders

## 2012-05-16 ENCOUNTER — Telehealth: Payer: Self-pay

## 2012-05-16 NOTE — Telephone Encounter (Signed)
Dawn called pt daughter asking if we done testing to tell if someone has dementia I know of MMS I told her that I would ask you please advise

## 2012-05-16 NOTE — Telephone Encounter (Signed)
Called Dawn informed her of MMS and apt she said ok will call back and make apt

## 2012-05-16 NOTE — Telephone Encounter (Signed)
we can set up to do the Mini-Mental status exam whenever it is convenient

## 2012-05-18 ENCOUNTER — Other Ambulatory Visit: Payer: Self-pay | Admitting: Family Medicine

## 2012-05-18 LAB — CULTURE, URINE COMPREHENSIVE: Colony Count: >=100000

## 2012-05-18 MED ORDER — CIPROFLOXACIN HCL 500 MG PO TABS: 500.0000 mg | ORAL_TABLET | Freq: Two times a day (BID) | ORAL | Status: DC

## 2012-05-21 ENCOUNTER — Telehealth: Payer: Self-pay | Admitting: Family Medicine

## 2012-05-21 MED ORDER — NITROFURANTOIN MACROCRYSTAL 50 MG PO CAPS: 50.0000 mg | ORAL_CAPSULE | Freq: Two times a day (BID) | ORAL | Status: DC

## 2012-05-21 NOTE — Telephone Encounter (Signed)
She has a UTI. She does have allergic reactions to multiple medications. I will place her on nitrofurantoin at a low dosing and monitor her.

## 2012-05-21 NOTE — Telephone Encounter (Signed)
LEFT DAWN MESSAGE WORD FOR WORD

## 2012-05-21 NOTE — Telephone Encounter (Signed)
Call the daughter and let her know I placed her on nitrofurantoin. We need to be careful with this because of her age and I will need to recheck her in 2 weeks.

## 2012-05-28 ENCOUNTER — Telehealth: Payer: Self-pay

## 2012-05-28 NOTE — Telephone Encounter (Signed)
CALLED DAUGHTER AND HAVE SCHEDULED APT

## 2012-06-07 ENCOUNTER — Ambulatory Visit (INDEPENDENT_AMBULATORY_CARE_PROVIDER_SITE_OTHER): Payer: MEDICARE | Admitting: Family Medicine

## 2012-06-07 ENCOUNTER — Encounter: Payer: Self-pay | Admitting: Family Medicine

## 2012-06-07 VITALS — BP 112/64 | HR 60 | Wt 142.0 lb

## 2012-06-07 DIAGNOSIS — N390 Urinary tract infection, site not specified: Secondary | ICD-10-CM

## 2012-06-07 LAB — POCT URINALYSIS DIPSTICK
Bilirubin, UA: NEGATIVE
Nitrite, UA: NEGATIVE
Urobilinogen, UA: NEGATIVE
pH, UA: 5

## 2012-06-07 NOTE — Progress Notes (Signed)
  Subjective:    Patient ID: [name], female    DOB: [birth year], 79 y.o.   MRN: [id number]  HPI She is here for recheck. On her last visit we did stop the Celexa and her daughter has noted an improvement in her demeanor. She recently finished a course of Macrobid for treatment of recurrent UTI. She still does complain of some lower abdominal discomfort but has complained about this for a long period of time. She is also here to have mental status evaluation.   Review of Systems     Objective:   Physical Exam Alert and in no distress. Her demeanor is definitely improved from last visit. MMSE 24. Appropriate paperwork was filled out. Urine microscopic did show scattered white cells. A culture will be taken.       Assessment & Plan:   1. UTI (urinary tract infection)  POCT Urinalysis Dipstick  2. Recurrent UTI  CULTURE, URINE COMPREHENSIVE   appropriate paperwork was filled out for her to get help around the house. I will also discuss preventative measures to help her recurrent UTI with Dr. Vernie Ammons.

## 2012-06-11 LAB — CULTURE, URINE COMPREHENSIVE

## 2012-06-12 ENCOUNTER — Other Ambulatory Visit: Payer: Self-pay

## 2012-06-12 MED ORDER — NITROFURANTOIN MACROCRYSTAL 50 MG PO CAPS: 50.0000 mg | ORAL_CAPSULE | Freq: Two times a day (BID) | ORAL | Status: DC

## 2012-06-12 NOTE — Progress Notes (Signed)
Quick Note:  Called dawn pt daughter home # left message   Case discussed with Dr. Vernie Ammons recommends using Macrobid 50 mg daily. Discussed this with her daughter and if she is in agreement go ahead and call it in and I have called med in and told dawn that if she wanted to she could pick it up ______

## 2012-06-25 ENCOUNTER — Other Ambulatory Visit: Payer: Self-pay | Admitting: Family Medicine

## 2012-06-25 NOTE — Telephone Encounter (Signed)
IS THIS OK 

## 2012-08-17 ENCOUNTER — Other Ambulatory Visit (INDEPENDENT_AMBULATORY_CARE_PROVIDER_SITE_OTHER): Payer: MEDICARE

## 2012-08-17 ENCOUNTER — Other Ambulatory Visit: Payer: Self-pay | Admitting: Family Medicine

## 2012-08-17 ENCOUNTER — Telehealth: Payer: Self-pay | Admitting: Family Medicine

## 2012-08-17 DIAGNOSIS — N390 Urinary tract infection, site not specified: Secondary | ICD-10-CM

## 2012-08-17 LAB — POCT URINALYSIS DIPSTICK
Bilirubin, UA: NEGATIVE
Glucose, UA: NEGATIVE
Ketones, UA: NEGATIVE
Nitrite, UA: NEGATIVE
Spec Grav, UA: 1.01
pH, UA: 6

## 2012-08-17 MED ORDER — CEFUROXIME AXETIL 250 MG PO TABS: 250.0000 mg | ORAL_TABLET | Freq: Two times a day (BID) | ORAL | Status: DC

## 2012-08-17 NOTE — Telephone Encounter (Signed)
Talked to judy which is taking care of her today and she will bring her over to drop the specimen by

## 2012-08-17 NOTE — Progress Notes (Signed)
She brought in her urine specimen for evaluation of urgency and frequency. It was grossly bloody and microscopic did show multiple red cells and bacteria. I referred to her previous culture and sensitivity. She is sensitive to cephalosporins. I will place her on Ceftin. I did note that she does have an allergy to penicillin although I think it's worth trying especially with her other allergy history. Allergy referral will be made. Was discussed with her daughter.

## 2012-08-17 NOTE — Telephone Encounter (Signed)
Pt called and stated she was having symptoms of a uti. She is experiencing blood in urine and lower stomach pain. She wants to know if she needs to be seen or if she can just drop off a sample as she has done in the past. Please inform pt. Pt uses [facility] [address]

## 2012-08-17 NOTE — Telephone Encounter (Signed)
Have her drop the specimen off today

## 2012-08-20 ENCOUNTER — Telehealth: Payer: Self-pay

## 2012-08-20 ENCOUNTER — Telehealth: Payer: Self-pay | Admitting: Internal Medicine

## 2012-08-20 MED ORDER — NITROFURANTOIN MACROCRYSTAL 50 MG PO CAPS: 50.0000 mg | ORAL_CAPSULE | Freq: Two times a day (BID) | ORAL | Status: DC

## 2012-08-20 NOTE — Telephone Encounter (Signed)
I will increase her Macrodantin to 50 twice a day until she can be seen by urology.

## 2012-08-20 NOTE — Telephone Encounter (Signed)
PATIENT DAUGHTER CALLED Friday AND WANTED TO KNOW IF YOU HAD SENT IN MORE MED FOR PATIENT PLEASE ADVISE

## 2012-08-20 NOTE — Telephone Encounter (Signed)
LEFT MESSAGE WORD F0R WORD

## 2012-08-20 NOTE — Telephone Encounter (Signed)
Pt has an appt with Dr. Vernie Ammons @ [facility] April 17,2014 @2 :30pm for reoccurrence UTIs. Called Daughter @ home and notified her of the appt.. Faxed labs and ov notes over [phone]

## 2012-08-20 NOTE — Telephone Encounter (Signed)
g her know that I called in twice a day dosing of the Macrodantin until she can be seen by urology

## 2012-08-30 ENCOUNTER — Telehealth: Payer: Self-pay | Admitting: Family Medicine

## 2012-08-30 MED ORDER — LEVOTHYROXINE SODIUM 88 MCG PO TABS: 88.0000 ug | ORAL_TABLET | Freq: Every day | ORAL | Status: DC

## 2012-08-30 NOTE — Telephone Encounter (Signed)
SENT IN MED  

## 2012-08-31 ENCOUNTER — Other Ambulatory Visit: Payer: MEDICARE

## 2012-09-03 ENCOUNTER — Other Ambulatory Visit: Payer: Self-pay

## 2012-09-03 MED ORDER — LEVOTHYROXINE SODIUM 88 MCG PO TABS: 88.0000 ug | ORAL_TABLET | Freq: Every day | ORAL | Status: DC

## 2012-09-03 NOTE — Telephone Encounter (Signed)
SENT IN THYROID MED TO [facility]

## 2012-09-14 ENCOUNTER — Ambulatory Visit (INDEPENDENT_AMBULATORY_CARE_PROVIDER_SITE_OTHER): Payer: MEDICARE | Admitting: Medical

## 2012-09-14 ENCOUNTER — Encounter: Payer: Self-pay | Admitting: Medical

## 2012-09-14 VITALS — BP 120/60 | HR 80 | Temp 97.3°F | Resp 14 | Wt 145.0 lb

## 2012-09-14 DIAGNOSIS — R140 Abdominal distension (gaseous): Secondary | ICD-10-CM

## 2012-09-14 DIAGNOSIS — R143 Flatulence: Secondary | ICD-10-CM

## 2012-09-14 DIAGNOSIS — R109 Unspecified abdominal pain: Secondary | ICD-10-CM

## 2012-09-14 LAB — POCT URINALYSIS DIPSTICK
Blood, UA: NEGATIVE
Ketones, UA: NEGATIVE
Protein, UA: NEGATIVE
Spec Grav, UA: 1.015
Urobilinogen, UA: NEGATIVE

## 2012-09-14 NOTE — Patient Instructions (Addendum)
Urine actually doesn't look bad today.   Continue your daily prophylactic antibiotic for recurrent urinary tract infection.  For bloating, pain, stool issues -   i currently don't think you have diverticulitis  i would focus on water intake and fiber intake - grains, vegetable such as greens, fruits  Consider an OTC fiber supplement such as Fibercon or Metamucil  You can use stool softeners as needed such as the Senna  If not seeing some improvement in the next 3-4 days, let us know  Below are symptoms of diverticulitis that would prompt recheck  Diverticulitis A diverticulum is a small pouch or sac on the colon. Diverticulosis is the presence of these diverticula on the colon. Diverticulitis is the irritation (inflammation) or infection of diverticula. CAUSES  The colon and its diverticula contain bacteria. If food particles block the tiny opening to a diverticulum, the bacteria inside can grow and cause an increase in pressure. This leads to infection and inflammation and is called diverticulitis. SYMPTOMS   Abdominal pain and tenderness. Usually, the pain is located on the left side of your abdomen. However, it could be located elsewhere.  Fever.  Bloating.  Feeling sick to your stomach (nausea).  Throwing up (vomiting).  Abnormal stools.

## 2012-09-14 NOTE — Progress Notes (Signed)
Subjective: Here for abdominal pain .  She saw urology recently, put on daily prophylactic antibiotic for recurrent UTI which seems to be helping.  Here with caregiver.   The last few days having some lower abdominal pain, some loose stools, no blood no mucous.  Having bloating, gas, at times feels constipation however.  Been using some stool softener, senna.  BM kind of loose twice today.  No f, no NVD, no urinary issues currently.  She was worried about diverticulitis.  She does have hx/o diverticulosis.  No other aggravating or relieving factors.    No other c/o.  The following portions of the patient's history were reviewed and updated as appropriate: allergies, current medications, past family history, past medical history, past social history, past surgical history and problem list.  ROS Otherwise as in subjective above  Objective: Physical Exam  Vital signs reviewed  General appearance: alert, no distress, WD/WN Neck: supple, no lymphadenopathy, no thyromegaly, no masses Heart: RRR, normal S1, S2, no murmurs Lungs: CTA bilaterally, no wheezes, rhonchi, or rales Abdomen: +bs, soft, mild generalized tenderness, non distended, no masses, no hepatomegaly, no splenomegaly Pulses: 2+ radial pulses, 2+ pedal pulses, normal cap refill Ext: no edema   Assessment: Encounter Diagnoses  Name Primary?  . Abdominal pain, unspecified site Yes  . Bloating     Plan: Urine actually doesn't look bad today.   Continue your daily prophylactic antibiotic for recurrent urinary tract infection.  Advised she increase water intake, consider daily fiber supplement, senna prn.   If worse in the next few days recheck.  Otherwise recheck 1-2 wk.  Discussed signs of diverticulitis that would prompt urgent recheck.  i don't suspect that currently.   Watch and wait approach.

## 2012-10-02 ENCOUNTER — Ambulatory Visit (INDEPENDENT_AMBULATORY_CARE_PROVIDER_SITE_OTHER): Payer: MEDICARE | Admitting: Family Medicine

## 2012-10-02 VITALS — BP 110/60 | HR 58 | Wt 147.0 lb

## 2012-10-02 DIAGNOSIS — R109 Unspecified abdominal pain: Secondary | ICD-10-CM

## 2012-10-02 LAB — POCT URINALYSIS DIPSTICK
Blood, UA: NEGATIVE
Glucose, UA: NEGATIVE
Nitrite, UA: NEGATIVE
Protein, UA: NEGATIVE
Urobilinogen, UA: NEGATIVE

## 2012-10-02 NOTE — Progress Notes (Signed)
  Subjective:    Patient ID: [name], female    DOB: [birth year], 79 y.o.   MRN: [id number]  HPI She is here for evaluation of continued difficulty with abdominal pain. She was seen by urology and no good etiology could be found. They felt that it could easily have been diverticular disease. She has been on a stool softener and bulk laxative recently and notes no improvement in her abdominal discomfort. Her stools have become more loose. Her daughter thinks this could be a variant of IBS. She has had no fever, chills, blood in her stool, vomiting. No urgency or frequency.   Review of Systems     Objective:   Physical Exam Alert and in no distress. Abdominal exam shows active bowel sounds. No masses were noted but she did have some slight tenderness mainly in the lower abdominal area but no rebound. Urine dipstick negative      Assessment & Plan:  Abdominal  pain, other specified site I discussed the fact with her and her daughter that we have had a difficult time truly finding out what is causing her abdominal pain which has been going on for couple of years. I will refer her back to Dr. Loreta Ave to get her input into this.

## 2012-10-02 NOTE — Addendum Note (Signed)
Addended by: Lavell Islam on: 10/02/2012 02:31 PM   Modules accepted: Orders

## 2012-10-17 ENCOUNTER — Other Ambulatory Visit: Payer: Self-pay | Admitting: Gastroenterology

## 2012-10-17 DIAGNOSIS — R109 Unspecified abdominal pain: Secondary | ICD-10-CM

## 2012-10-22 ENCOUNTER — Telehealth: Payer: Self-pay

## 2012-10-22 NOTE — Telephone Encounter (Signed)
Dawn states that oxygen is from dr.[employer]  cardio

## 2012-10-25 ENCOUNTER — Telehealth: Payer: Self-pay | Admitting: Family Medicine

## 2012-10-25 NOTE — Telephone Encounter (Signed)
Talk to her see what is going on and probably schedule her to be seen tomorrow

## 2012-10-26 ENCOUNTER — Encounter: Payer: Self-pay | Admitting: Family Medicine

## 2012-10-26 ENCOUNTER — Ambulatory Visit (INDEPENDENT_AMBULATORY_CARE_PROVIDER_SITE_OTHER): Payer: MEDICARE | Admitting: Family Medicine

## 2012-10-26 DIAGNOSIS — R143 Flatulence: Secondary | ICD-10-CM

## 2012-10-26 DIAGNOSIS — R109 Unspecified abdominal pain: Secondary | ICD-10-CM

## 2012-10-26 DIAGNOSIS — R141 Gas pain: Secondary | ICD-10-CM

## 2012-10-26 DIAGNOSIS — R140 Abdominal distension (gaseous): Secondary | ICD-10-CM

## 2012-10-26 LAB — COMPREHENSIVE METABOLIC PANEL
AST: 21 U/L (ref 0–37)
Albumin: 4.3 g/dL (ref 3.5–5.2)
Alkaline Phosphatase: 86 U/L (ref 39–117)
BUN: 35 mg/dL — ABNORMAL HIGH (ref 6–23)
Creat: 1.91 mg/dL — ABNORMAL HIGH (ref 0.50–1.10)
Potassium: 4.6 mEq/L (ref 3.5–5.3)
Total Bilirubin: 0.4 mg/dL (ref 0.3–1.2)

## 2012-10-26 MED ORDER — HYOSCYAMINE SULFATE 0.125 MG SL SUBL: 0.1250 mg | SUBLINGUAL_TABLET | SUBLINGUAL | Status: DC | PRN

## 2012-10-26 MED ORDER — VENLAFAXINE HCL ER 37.5 MG PO CP24: 37.5000 mg | ORAL_CAPSULE | Freq: Every day | ORAL | Status: DC

## 2012-10-26 NOTE — Progress Notes (Signed)
  Subjective:    Patient ID: [name], female    DOB: October 25, 1919, 79 y.o.   MRN: [id number]  HPI She is here for consultation. She continues to have low abdominal pain, bloating, generalized fatigue. She is currently being followed by Dr. Loreta Ave. Recent blood work apparently did show decreased renal function however in the last several weeks she has increased her fluid intake and is here for followup blood work. Apparently she is having 3 BMs per day and they are soft. She still does occasionally have the bloating. Recently she was tried on a bulk-type laxative which actually cause more difficulty with bloating.   Review of Systems     Objective:   Physical Exam Alert and in no distress with a flat affect.       Assessment & Plan:  Abdominal pain, unspecified site - Plan: Comprehensive metabolic panel, venlafaxine XR (EFFEXOR XR) 37.5 MG 24 hr capsule, hyoscyamine (LEVSIN/SL) 0.125 MG SL tablet  Abdominal bloating - Plan: Comprehensive metabolic panel her daughters are in the room. We discussed the overall situation in detail with him. I explained that so far he really have not found a good reason behind her lower abdominal pain. I decided to try her on Effexor to see if that'll help with her overall psyche as well as potentially helping with the pain. We'll also give some Levsin to see if that can help with the cramping component of this. They're comfortable with this approach. They will followup with Dr. Loreta Ave.

## 2012-10-29 NOTE — Progress Notes (Signed)
Quick Note:  PT DAUGHTER DAWN INFORMED AND VERBALIZED UNDERSTANDING ALSO FAXED TO DR.MANN ______

## 2012-10-30 ENCOUNTER — Ambulatory Visit
Admission: RE | Admit: 2012-10-30 | Discharge: 2012-10-30 | Disposition: A | Discharge status: Home / Self Care | Payer: MEDICARE | Source: Ambulatory Visit | Attending: Gastroenterology | Admitting: Gastroenterology

## 2012-10-30 ENCOUNTER — Other Ambulatory Visit: Payer: Self-pay | Admitting: Gastroenterology

## 2012-10-30 DIAGNOSIS — R109 Unspecified abdominal pain: Secondary | ICD-10-CM

## 2012-10-31 ENCOUNTER — Other Ambulatory Visit: Payer: Self-pay | Admitting: Gastroenterology

## 2012-10-31 ENCOUNTER — Telehealth: Payer: Self-pay | Admitting: Family Medicine

## 2012-10-31 ENCOUNTER — Other Ambulatory Visit: Payer: MEDICARE

## 2012-10-31 DIAGNOSIS — R109 Unspecified abdominal pain: Secondary | ICD-10-CM

## 2012-10-31 NOTE — Telephone Encounter (Signed)
Dawn call for pt and said we are supposed to call in Effexor 75 mg for pt as she just finished the 37.5 mg     To [facility] [address]

## 2012-11-01 ENCOUNTER — Other Ambulatory Visit: Payer: Self-pay

## 2012-11-01 MED ORDER — VENLAFAXINE HCL ER 75 MG PO CP24: 75.0000 mg | ORAL_CAPSULE | Freq: Every day | ORAL | Status: DC

## 2012-11-01 NOTE — Telephone Encounter (Signed)
I will now switch her to 75 mg. I will go slowly because of her age

## 2012-11-01 NOTE — Telephone Encounter (Signed)
Pt seen 6/20

## 2012-11-01 NOTE — Telephone Encounter (Signed)
I just called in the 75 mg. I will go slowly with this. Have them call me in 3 or 4 weeks.

## 2012-11-01 NOTE — Telephone Encounter (Signed)
DR.LALONDE IS THIS OK AND IS SHE TO TAKE IT ONE A DAY OR BID

## 2012-11-05 ENCOUNTER — Other Ambulatory Visit: Payer: Self-pay | Admitting: Gastroenterology

## 2012-11-05 ENCOUNTER — Ambulatory Visit
Admission: RE | Admit: 2012-11-05 | Discharge: 2012-11-05 | Disposition: A | Discharge status: Home / Self Care | Payer: MEDICARE | Source: Ambulatory Visit | Attending: Gastroenterology | Admitting: Gastroenterology

## 2012-11-05 DIAGNOSIS — R109 Unspecified abdominal pain: Secondary | ICD-10-CM

## 2012-11-12 ENCOUNTER — Other Ambulatory Visit: Payer: Self-pay | Admitting: Gastroenterology

## 2012-11-23 ENCOUNTER — Encounter (HOSPITAL_COMMUNITY): Payer: Self-pay | Admitting: Pharmacy Technician

## 2012-11-26 ENCOUNTER — Ambulatory Visit (HOSPITAL_COMMUNITY)
Admission: RE | Admit: 2012-11-26 | Discharge: 2012-11-26 | Disposition: A | Discharge status: Home / Self Care | Payer: MEDICARE | Source: Ambulatory Visit | Attending: Gastroenterology | Admitting: Gastroenterology

## 2012-11-26 ENCOUNTER — Encounter (HOSPITAL_COMMUNITY)
Admission: RE | Disposition: A | Discharge status: Home / Self Care | Payer: Self-pay | Source: Ambulatory Visit | Attending: Gastroenterology

## 2012-11-26 ENCOUNTER — Encounter (HOSPITAL_COMMUNITY): Payer: Self-pay

## 2012-11-26 DIAGNOSIS — R1033 Periumbilical pain: Secondary | ICD-10-CM | POA: Insufficient documentation

## 2012-11-26 DIAGNOSIS — K449 Diaphragmatic hernia without obstruction or gangrene: Secondary | ICD-10-CM | POA: Insufficient documentation

## 2012-11-26 DIAGNOSIS — K319 Disease of stomach and duodenum, unspecified: Secondary | ICD-10-CM | POA: Insufficient documentation

## 2012-11-26 DIAGNOSIS — K219 Gastro-esophageal reflux disease without esophagitis: Secondary | ICD-10-CM | POA: Insufficient documentation

## 2012-11-26 DIAGNOSIS — R933 Abnormal findings on diagnostic imaging of other parts of digestive tract: Secondary | ICD-10-CM | POA: Insufficient documentation

## 2012-11-26 HISTORY — PX: ESOPHAGOGASTRODUODENOSCOPY: SHX5428

## 2012-11-26 SURGERY — EGD (ESOPHAGOGASTRODUODENOSCOPY)
Anesthesia: Moderate Sedation

## 2012-11-26 MED ORDER — SODIUM CHLORIDE 0.9 % IV SOLN
INTRAVENOUS | Status: DC
  Administered 2012-11-26: 500 mL via INTRAVENOUS

## 2012-11-26 MED ORDER — FENTANYL CITRATE 0.05 MG/ML IJ SOLN
INTRAMUSCULAR | Status: DC | PRN
  Administered 2012-11-26: 25 ug via INTRAVENOUS

## 2012-11-26 MED ORDER — MIDAZOLAM HCL 10 MG/2ML IJ SOLN
INTRAMUSCULAR | Status: DC | PRN
  Administered 2012-11-26 (×3): 1 mg via INTRAVENOUS

## 2012-11-26 MED ORDER — MIDAZOLAM HCL 10 MG/2ML IJ SOLN
INTRAMUSCULAR | Status: AC
  Filled 2012-11-26: qty 2

## 2012-11-26 MED ORDER — FENTANYL CITRATE 0.05 MG/ML IJ SOLN
INTRAMUSCULAR | Status: AC
  Filled 2012-11-26: qty 2

## 2012-11-26 NOTE — Op Note (Signed)
[facility] [address] [place], [address]   OPERATIVE PROCEDURE REPORT  PATIENT :[name], [name]  MR#: [id number] BIRTHDATE :July 27, 1919 GENDER: Female ENDOSCOPIST: Dr.  Lorenza Burton, MD ASSISTANT:   Kandice Robinsons, technician Jimmey Ralph, RN, CGRN PROCEDURE DATE: 12-05-2012 PRE-PROCEDURE PREPERATION: Patient fasted for 4 hours prior to procedure. PRE-PROCEDURE PHYSICAL: Patient has stable vital signs.  Neck is supple.  There is no JVD, thyromegaly or LAD.  Chest clear to auscultation.  S1 and S2 regular.  Abdomen soft, non-distended, non-tender with NABS. PROCEDURE:     EGD with multiple cold biopsies. ASA CLASS:     Class III INDICATIONS:     Periumbilical pain, GERD & Abnormal UGI series.  MEDICATIONS:     Fentanyl 25 mcg IV & Versed 3 mg IV TOPICAL ANESTHETIC:   none  DESCRIPTION OF PROCEDURE: After the risks benefits and alternatives of the procedure were thoroughly explained, informed consent was obtained.  The Pentax Gastroscope [id number]  was introduced through the mouth and advanced to the second portion of the duodenum , without limitations. The instrument was slowly withdrawn as the mucosa was fully examined.   The esophagus and GEJ appeared normal. Antral erythema was noted ?GAVE. A small ulcer was noyted in the midbody and biopseis were done. The proximal small bowel appeared normal. There were no erosions, masses or polyps noted. Retroflexed views revealed a small hiatal hernia. The scope was then withdrawn from the patient and the procedure terminated. The patient tolerated the procedure without immediate complications.  IMPRESSION:  1) Normal esophagus and GEJ. 2) Antral erythema consistent with GAVE. 3) Small hiatal hernia. 4) Normal proximal small bowel.  RECOMMENDATIONS:     1.  Await pathology results. 2.  Avoid NSAIDS for two weeks. 3.  Anti-reflux regimen to be followed. 4.  Continue PPI's. 5.  OP follow-up in 2  weeks.  REPEAT EXAM:  No recall due to age.  DISCHARGE INSTRUCTIONS: standard discharge instructions given. _______________________________ eSigned:  Dr. Lorenza Burton, MD Dec 05, 2012 1:28 PM   CPT CODES:     95621, EGD with biopsy  DIAGNOSIS CODES:     789.05, 530.81, 793.4   CC: Sharlot Gowda, M.D.  PATIENT NAME:  [name], [name] MR#: [id number]

## 2012-11-27 ENCOUNTER — Encounter (HOSPITAL_COMMUNITY): Payer: Self-pay | Admitting: Gastroenterology

## 2012-11-28 ENCOUNTER — Telehealth: Payer: Self-pay

## 2012-11-28 NOTE — Telephone Encounter (Signed)
[name] FROM [facility] CALLED AND LEFT A MESSAGE FOR [place] SHE STATED [name] IS VERY DEPRESSED IT IS LIKE PULLING TEETH TO GET HER TO DO ANYTHING SHE STATED [name]( HAD TOLD HER SHE WAS ON EFFEXOR AND SHE THOUGHT THAT WAS WHAT MAKING HER DEPRESSED ) SHE JUST WANTED [place] TO BE AWARE OF THE SITUATION

## 2012-11-28 NOTE — Telephone Encounter (Signed)
CALLED DAWN LEFT MESSAGE DR.LALONDE WANTED TO SEE MRS.Guisinger AND ALSO CALLED AND LEFT MESSAGE FOR [name] THAT I HAD LEFT MESSAGE WITH DAWN TO MAKE APPOINTMENT NEXT WEEK

## 2012-11-28 NOTE — Telephone Encounter (Signed)
She will probably need a followup appointment in the next week or so

## 2012-12-04 ENCOUNTER — Telehealth: Payer: Self-pay | Admitting: Family Medicine

## 2012-12-04 ENCOUNTER — Encounter: Payer: Self-pay | Admitting: Family Medicine

## 2012-12-04 ENCOUNTER — Emergency Department (HOSPITAL_COMMUNITY): Payer: MEDICARE

## 2012-12-04 ENCOUNTER — Encounter (HOSPITAL_COMMUNITY): Payer: Self-pay | Admitting: Emergency Medicine

## 2012-12-04 ENCOUNTER — Emergency Department (HOSPITAL_COMMUNITY)
Admission: EM | Admit: 2012-12-04 | Discharge: 2012-12-04 | Disposition: A | Discharge status: Home / Self Care | Payer: MEDICARE | Attending: Emergency Medicine | Admitting: Emergency Medicine

## 2012-12-04 ENCOUNTER — Ambulatory Visit (INDEPENDENT_AMBULATORY_CARE_PROVIDER_SITE_OTHER): Payer: MEDICARE | Admitting: Family Medicine

## 2012-12-04 VITALS — BP 124/70 | HR 78 | Wt 143.0 lb

## 2012-12-04 DIAGNOSIS — R630 Anorexia: Secondary | ICD-10-CM | POA: Insufficient documentation

## 2012-12-04 DIAGNOSIS — F329 Major depressive disorder, single episode, unspecified: Secondary | ICD-10-CM

## 2012-12-04 DIAGNOSIS — R0602 Shortness of breath: Secondary | ICD-10-CM | POA: Insufficient documentation

## 2012-12-04 DIAGNOSIS — Z9981 Dependence on supplemental oxygen: Secondary | ICD-10-CM | POA: Insufficient documentation

## 2012-12-04 DIAGNOSIS — Z8739 Personal history of other diseases of the musculoskeletal system and connective tissue: Secondary | ICD-10-CM | POA: Insufficient documentation

## 2012-12-04 DIAGNOSIS — E039 Hypothyroidism, unspecified: Secondary | ICD-10-CM | POA: Insufficient documentation

## 2012-12-04 DIAGNOSIS — R42 Dizziness and giddiness: Secondary | ICD-10-CM | POA: Insufficient documentation

## 2012-12-04 DIAGNOSIS — I509 Heart failure, unspecified: Secondary | ICD-10-CM

## 2012-12-04 DIAGNOSIS — F411 Generalized anxiety disorder: Secondary | ICD-10-CM | POA: Insufficient documentation

## 2012-12-04 DIAGNOSIS — I951 Orthostatic hypotension: Secondary | ICD-10-CM

## 2012-12-04 DIAGNOSIS — Z8679 Personal history of other diseases of the circulatory system: Secondary | ICD-10-CM | POA: Insufficient documentation

## 2012-12-04 DIAGNOSIS — Z79899 Other long term (current) drug therapy: Secondary | ICD-10-CM | POA: Insufficient documentation

## 2012-12-04 DIAGNOSIS — Z880 Allergy status to penicillin: Secondary | ICD-10-CM | POA: Insufficient documentation

## 2012-12-04 DIAGNOSIS — I10 Essential (primary) hypertension: Secondary | ICD-10-CM | POA: Insufficient documentation

## 2012-12-04 DIAGNOSIS — R531 Weakness: Secondary | ICD-10-CM

## 2012-12-04 DIAGNOSIS — R5383 Other fatigue: Secondary | ICD-10-CM

## 2012-12-04 LAB — URINALYSIS, ROUTINE W REFLEX MICROSCOPIC
Hgb urine dipstick: NEGATIVE
Specific Gravity, Urine: 1.013 (ref 1.005–1.030)
Urobilinogen, UA: 0.2 mg/dL (ref 0.0–1.0)

## 2012-12-04 LAB — CBC WITH DIFFERENTIAL/PLATELET
Eosinophils Absolute: 0.1 10*3/uL (ref 0.0–0.7)
Eosinophils Relative: 1 % (ref 0–5)
Lymphs Abs: 2 10*3/uL (ref 0.7–4.0)
MCH: 31.9 pg (ref 26.0–34.0)
MCV: 91.9 fL (ref 78.0–100.0)
Monocytes Relative: 9 % (ref 3–12)
Platelets: 284 10*3/uL (ref 150–400)
RBC: 4.7 MIL/uL (ref 3.87–5.11)

## 2012-12-04 LAB — URINE MICROSCOPIC-ADD ON

## 2012-12-04 LAB — BASIC METABOLIC PANEL
BUN: 32 mg/dL — ABNORMAL HIGH (ref 6–23)
Chloride: 96 mEq/L (ref 96–112)
GFR calc Af Amer: 30 mL/min — ABNORMAL LOW (ref >90)
Glucose, Bld: 99 mg/dL (ref 70–99)
Potassium: 4.5 mEq/L (ref 3.5–5.1)
Sodium: 135 mEq/L (ref 135–145)

## 2012-12-04 LAB — COMPREHENSIVE METABOLIC PANEL
AST: 19 U/L (ref 0–37)
Alkaline Phosphatase: 73 U/L (ref 39–117)
BUN: 31 mg/dL — ABNORMAL HIGH (ref 6–23)
Creat: 1.53 mg/dL — ABNORMAL HIGH (ref 0.50–1.10)
Potassium: 4.6 mEq/L (ref 3.5–5.3)

## 2012-12-04 LAB — CBC
HCT: 46.5 % — ABNORMAL HIGH (ref 36.0–46.0)
Hemoglobin: 15.3 g/dL — ABNORMAL HIGH (ref 12.0–15.0)
RDW: 15.5 % (ref 11.5–15.5)
WBC: 10.8 10*3/uL — ABNORMAL HIGH (ref 4.0–10.5)

## 2012-12-04 LAB — TROPONIN I: Troponin I: <0.3 ng/mL (ref <0.30)

## 2012-12-04 MED ORDER — SODIUM CHLORIDE 0.9 % IV BOLUS (SEPSIS)
500.0000 mL | Freq: Once | INTRAVENOUS | Status: AC
  Administered 2012-12-04: 500 mL via INTRAVENOUS

## 2012-12-04 NOTE — Progress Notes (Signed)
  Subjective:    Patient ID: [name], female    DOB: 09-Nov-1919, 79 y.o.   MRN: [id number]  HPI She is here for recheck. She continues to complain of fatigue but no chest pain, shortness of breath, PND or edema. She continues on medications listed in the chart. She has a previous history of CHF and has not seen her cardiologist in several months. No fever, chills, sore throat, cough or congestion.  Review of Systems     Objective:   Physical Exam alert and in no distress. Tympanic membranes and canals are normal. Throat is clear. Tonsils are normal. Neck is supple without adenopathy or thyromegaly. Cardiac exam shows a regular sinus rhythm without murmurs or gallops. Lungs are clear to auscultation with a respiratory rate of roughly 30 EKG  shows pacer spikes as well as ST abnormalities.       Assessment & Plan:  CHF (congestive heart failure), NYHA class III, unspecified failure chronicity, unspecified type - Plan: CBC with Differential, Comprehensive metabolic panel, EKG 12-Lead, DG Chest 2 View, Brain natriuretic peptide  Depression  Weakness generalized - Plan: CBC with Differential, Comprehensive metabolic panel, EKG 12-Lead, DG Chest 2 View, Brain natriuretic peptide  Encounter for long-term (current) use of other medications - Plan: CBC with Differential, Comprehensive metabolic panel, TSH, EKG 12-Lead, DG Chest 2 View, Brain natriuretic peptide  Hypothyroid - Plan: TSH  she will be scheduled to see Dr. Katrinka Blazing for followup on her EKG and possible worsening CHF.

## 2012-12-04 NOTE — Addendum Note (Signed)
Addended by: Ronnald Nian on: 12/04/2012 04:08 PM   Modules accepted: Orders

## 2012-12-04 NOTE — Telephone Encounter (Signed)
PT CALLED AND STATED SHE WAS DIZZY AND IN "BAD SHAPE". PT WAS INFORMED TO CALL 911 AND SHE REFUSED. WHILE I WAS ON THE PHONE WITH PT, MELISSA CALLED EMERGENCY CONTACT INFO AND HER HOME HEALTH NURSE COMPANY TO SEE IF WE COULD GET ASSISTANCE FOR HER. DAUGHTER WHO IS EMERGENCY CONTACT IS OUT OF TOWN AND ANSWERING MACHINE PICKED UP AT HOME HEALTH. PER DR LALONDE'S INSTRUCTIONS MELISSA THEN CALLED 911 TO HAVE SOMEONE GO AND CHECK ON PT. PT STATED SHE WOULD NOT ANSWER THE DOOR AND STATED SHE WOULD NOT GO TO HOSPITAL BY AMBULANCE. DR Susann Givens THEN GOT ON PHONE AND INFORMED PT TO ANSWER THE DOOR AND GATHER UP HER MEDS THE EMS WAS COMING.

## 2012-12-04 NOTE — ED Notes (Signed)
Patient had period of dizziness today after seeing her cardiologist this morning.  She called her doctor and doctor called EMS.  Patient was in bathroom when dizziness occurred.  Patient on home 02 but does not know why.  Patient denies chest pain but reports she is still lightheaded.  Alert and oriented, but wishes she was not here.

## 2012-12-04 NOTE — ED Notes (Signed)
Pt ambulated well with walker but c/o dizziness--- states "everything gets blurry", denies "the room is moving around"; pt's O2 sat remains 93%-94% on room air while ambulating.

## 2012-12-04 NOTE — ED Provider Notes (Signed)
CSN: [id number]     Arrival date & time 12/04/12  1816 History     First MD Initiated Contact with Patient 12/04/12 1844     Chief Complaint  Patient presents with  . Dizziness   (Consider location/radiation/quality/duration/timing/severity/associated sxs/prior Treatment) HPI Pt with dizziness when standing at MD office today. EMS called. Pt states she is currently asymptomatic. When she stood earlier she states she became lightheaded and presyncopal. No N/V, CP, SOB. Pt's daughter states she believes symptoms have worsened since starting Effexor. Pt has had decreased appetite and PO intake.   Past Medical History  Diagnosis Date  . Hypertension   . Osteoporosis   . Atrial fib/flutter, transient   . CHF (congestive heart failure)   . Shortness of breath     "lying down; when I up and around" (04/24/2012)  . Chronic renal insufficiency   . Hypothyroidism   . Arthritis     "back; hips" (04/24/2012)  . Anxiety   . On home oxygen therapy    Past Surgical History  Procedure Laterality Date  . Knee arthroscopy      right  . Joint replacement    . Insert / replace / remove pacemaker    . Total hip arthroplasty      right  . Esophagogastroduodenoscopy N/A 11/26/2012    Procedure: ESOPHAGOGASTRODUODENOSCOPY (EGD);  Surgeon: Charna Elizabeth, MD;  Location: [facility];  Service: Endoscopy;  Laterality: N/A;   Family History  Problem Relation Age of Onset  . Hypertension Mother   . Heart disease Mother     Heart failure (vague history)   History  Substance Use Topics  . Smoking status: Never Smoker   . Smokeless tobacco: Never Used  . Alcohol Use: No   OB History   Grav Para Term Preterm Abortions TAB SAB Ect Mult Living                 Review of Systems  Constitutional: Positive for appetite change. Negative for fever and chills.  HENT: Negative for neck pain.   Eyes: Negative for photophobia and visual disturbance.  Respiratory: Negative for cough and shortness of  breath.   Cardiovascular: Negative for chest pain, palpitations and leg swelling.  Gastrointestinal: Negative for nausea, vomiting, abdominal pain and diarrhea.  Genitourinary: Negative for dysuria, hematuria and flank pain.  Musculoskeletal: Negative for myalgias and back pain.  Skin: Negative for rash and wound.  Neurological: Positive for dizziness and light-headedness. Negative for syncope, weakness, numbness and headaches.  All other systems reviewed and are negative.    Allergies  Quinidine; Tiazac; Warfarin sodium; Percocet; Promethazine hcl; Amiodarone; Procainamide; Septra; Amoxicillin; Ciprofloxacin; and Polocaine  Home Medications   Current Outpatient Rx  Name  Route  Sig  Dispense  Refill  . amLODipine (NORVASC) 5 MG tablet   Oral   Take 5 mg by mouth every morning.          Marland Kitchen atenolol (TENORMIN) 25 MG tablet   Oral   Take 1 tablet (25 mg total) by mouth 2 (two) times daily.   30 tablet   0   . acetaminophen (TYLENOL) 650 MG CR tablet   Oral   Take 650 mg by mouth 2 (two) times daily.          Marland Kitchen acidophilus (RISAQUAD) CAPS   Oral   Take 1 capsule by mouth daily.         . Cholecalciferol (VITAMIN D) 2000 UNITS tablet   Oral   Take  2,000 Units by mouth daily.         Marland Kitchen conjugated estrogens (PREMARIN) vaginal cream   Vaginal   Place 1 g vaginally at bedtime.          . Cranberry 450 MG TABS   Oral   Take 1 tablet by mouth daily.         . Cyanocobalamin (VITAMIN B-12 PO)   Oral   Take 1,000 mcg by mouth daily.          . famotidine (PEPCID) 20 MG tablet   Oral   Take 20 mg by mouth 2 (two) times daily.         . furosemide (LASIX) 80 MG tablet   Oral   Take 40-80 mg by mouth 2 (two) times daily. Takes 80 mg in the morning and 40 mg at 2 pm         . hyoscyamine (LEVSIN/SL) 0.125 MG SL tablet   Sublingual   Place 1 tablet (0.125 mg total) under the tongue every 4 (four) hours as needed for cramping.   15 tablet   0   .  lactose free nutrition (BOOST PLUS) LIQD   Oral   Take 237 mLs by mouth 2 (two) times daily between meals.          Marland Kitchen levothyroxine (SYNTHROID, LEVOTHROID) 88 MCG tablet   Oral   Take 88 mcg by mouth daily before breakfast.         . magnesium hydroxide (MILK OF MAGNESIA) 400 MG/5ML suspension   Oral   Take 30 mLs by mouth daily as needed for constipation.         . Multiple Vitamins-Minerals (ICAPS PO)   Oral   Take 1 capsule by mouth daily.         . Omeprazole (PRILOSEC PO)   Oral   Take by mouth. 2 am before breakfast         . potassium chloride (K-DUR,KLOR-CON) 10 MEQ tablet   Oral   Take 10 mEq by mouth daily.         Marland Kitchen spironolactone (ALDACTONE) 25 MG tablet   Oral   Take 25 mg by mouth every morning.         . traMADol (ULTRAM) 50 MG tablet   Oral   Take 50 mg by mouth at bedtime as needed for pain.         Marland Kitchen TRIMETHOPRIM PO   Oral   Take 50 mg by mouth every morning.         . venlafaxine XR (EFFEXOR-XR) 75 MG 24 hr capsule   Oral   Take 75 mg by mouth every morning.          BP 163/80  Pulse 88  Temp(Src) 98.2 F (36.8 C) (Oral)  Resp 24  SpO2 99% Physical Exam  Nursing note and vitals reviewed. Constitutional: She is oriented to person, place, and time. She appears well-developed and well-nourished. No distress.  HENT:  Head: Normocephalic and atraumatic.  Mouth/Throat: Oropharynx is clear and moist.  Eyes: EOM are normal. Pupils are equal, round, and reactive to light.  Neck: Normal range of motion. Neck supple.  Cardiovascular: Normal rate and regular rhythm.   Pulmonary/Chest: Effort normal and breath sounds normal. No respiratory distress. She has no wheezes. She has no rales.  Abdominal: Soft. Bowel sounds are normal. She exhibits no distension and no mass. There is no tenderness. There is no rebound and no guarding.  Musculoskeletal: Normal range of motion. She exhibits no edema and no tenderness.  Neurological: She is  alert and oriented to person, place, and time.  5/5 motor in all ext, sensation intact.   Skin: Skin is warm and dry. No rash noted. No erythema.  Psychiatric: She has a normal mood and affect. Her behavior is normal.    ED Course   Procedures (including critical care time)  Labs Reviewed  CBC - Abnormal; Notable for the following:    WBC 10.8 (*)    Hemoglobin 15.3 (*)    HCT 46.5 (*)    All other components within normal limits  BASIC METABOLIC PANEL - Abnormal; Notable for the following:    BUN 32 (*)    Creatinine, Ser 1.67 (*)    GFR calc non Af Amer 26 (*)    GFR calc Af Amer 30 (*)    All other components within normal limits  PRO B NATRIURETIC PEPTIDE  URINALYSIS, ROUTINE W REFLEX MICROSCOPIC  TROPONIN I   Dg Chest Port 1 View  12/04/2012   *RADIOLOGY REPORT*  Clinical Data: Dizziness and shortness of breath.  PORTABLE CHEST - 1 VIEW  Comparison: 04/24/2012  Findings: Stable appearance of pacemaker.  No edema, infiltrate or pleural fluid is identified.  The heart size is stable and mildly enlarged.  IMPRESSION: No acute findings.   Original Report Authenticated By: Irish Lack, M.D.   No diagnosis found.  Date: 12/04/2012  Rate: 84  Rhythm: Ventricular paced rhythm   QRS Axis: normal  Intervals: normal  ST/T Wave abnormalities: nonspecific T wave changes  Conduction Disutrbances:none  Narrative Interpretation:   Old EKG Reviewed: changes noted Atrial paced rhythm on prior EKG.   MDM  Pt states she is at her baseline though does still have symptoms of orthostasis. Will give fluids. Pt states she wants to be d/c'd home with family. Pt advised to change positions slowly and f/u with PMD for possible med adjustment. Return precautions given.   Loren Racer, MD 12/04/12 2237

## 2012-12-04 NOTE — ED Notes (Signed)
[id number]<AV> Expected date:<BR> Expected time:<BR> Means of arrival:<BR> Comments:<BR> EMS-dizzy

## 2012-12-04 NOTE — Progress Notes (Signed)
EDCM spoke to patient at bedside regarding possiblehome health needs.  Patient reports she has [facility] with a visiting RN and PT currently.  She also reported she has an private duty aide stay with her from 9am to 4pm and a private duty RN stay with the patient at night from [facility] agency.  Patient reports she wears oxygen at night and this is supplied by [facility].  She also reports she has a walker and cane.  Patient refuses further home health services at this time.

## 2012-12-05 NOTE — Progress Notes (Signed)
Quick Note:  I talked to her caregiver. Explained that this looks like it's related to her heart. She has an appointment tomorrow. Make sure the cardiologist gets all the blood work x-rays etc. ______

## 2012-12-05 NOTE — Progress Notes (Signed)
Quick Note:    faxed  ______

## 2012-12-13 ENCOUNTER — Telehealth: Payer: Self-pay | Admitting: Internal Medicine

## 2012-12-13 NOTE — Telephone Encounter (Signed)
[name] wanting to know if they can extend her home health PT for another 2 weeks. She can take a verbal order

## 2012-12-13 NOTE — Telephone Encounter (Signed)
ok 

## 2012-12-13 NOTE — Telephone Encounter (Signed)
LEFT MESSAGE OK

## 2012-12-21 ENCOUNTER — Telehealth: Payer: Self-pay | Admitting: Internal Medicine

## 2012-12-21 NOTE — Telephone Encounter (Signed)
[name] pt's caregiver today states that she is have SOB,breathing problem while on oxygen, she is weak, she is having pressure in left neck,and chest area, she was sweating and pale this morning when Darel Hong came in this morning, her urine output for the last few days have not been good. [name] called Dr. Katrinka Blazing and he said that he doesn't think it was heart related.    PER JCL- Pt needs to go to the ER to get checked out

## 2012-12-24 ENCOUNTER — Telehealth: Payer: Self-pay

## 2012-12-24 NOTE — Telephone Encounter (Signed)
I CALLED DAWN AND GAVE HER POLVSK[employer] NUMBER AND ADRESS

## 2012-12-24 NOTE — Telephone Encounter (Signed)
[name] CALLED AND WANTED TO KNOW IF THEY COULD WEN HER OFF OF EFFEXOR SHE HAS BECOME MORE AGRAVATED  SINCE SHE HAS BEEN ON MED IF SO CAN WE CALL IN THE LOWER DOSE ALSO [name] WANTED TO KNOW IF SEEING A PSYCHIATRIST WOULD HELP PLEASE ADVISE

## 2012-12-24 NOTE — Telephone Encounter (Signed)
Have him followup with Dr. Donell Beers

## 2012-12-25 ENCOUNTER — Other Ambulatory Visit: Payer: Self-pay | Admitting: Family Medicine

## 2012-12-26 ENCOUNTER — Other Ambulatory Visit: Payer: Self-pay | Admitting: Family Medicine

## 2012-12-26 NOTE — Telephone Encounter (Signed)
Is this okay to refill? 

## 2012-12-28 ENCOUNTER — Other Ambulatory Visit: Payer: Self-pay | Admitting: Family Medicine

## 2012-12-31 ENCOUNTER — Telehealth: Payer: Self-pay | Admitting: Internal Medicine

## 2012-12-31 NOTE — Telephone Encounter (Signed)
IS THIS OK 

## 2012-12-31 NOTE — Telephone Encounter (Signed)
Refill request for venlafaxine #90 to [facility]

## 2013-01-02 ENCOUNTER — Telehealth: Payer: Self-pay | Admitting: Family Medicine

## 2013-01-02 ENCOUNTER — Telehealth: Payer: Self-pay | Admitting: Internal Medicine

## 2013-01-02 MED ORDER — VENLAFAXINE HCL ER 75 MG PO CP24: 75.0000 mg | ORAL_CAPSULE | Freq: Every morning | ORAL | Status: DC

## 2013-01-02 NOTE — Telephone Encounter (Signed)
Sent a refill request on 8/25 but it was never done so got another refill today and just refilled her med for 90 days

## 2013-01-03 NOTE — Telephone Encounter (Signed)
Brand name is great

## 2013-01-03 NOTE — Telephone Encounter (Signed)
Faxed back to [facility].

## 2013-01-27 ENCOUNTER — Encounter: Payer: Self-pay | Admitting: *Deleted

## 2013-01-27 ENCOUNTER — Encounter: Payer: Self-pay | Admitting: Interventional Cardiology

## 2013-02-08 ENCOUNTER — Ambulatory Visit (INDEPENDENT_AMBULATORY_CARE_PROVIDER_SITE_OTHER): Payer: MEDICARE | Admitting: Interventional Cardiology

## 2013-02-08 ENCOUNTER — Ambulatory Visit (INDEPENDENT_AMBULATORY_CARE_PROVIDER_SITE_OTHER): Payer: MEDICARE | Admitting: *Deleted

## 2013-02-08 ENCOUNTER — Encounter: Payer: Self-pay | Admitting: Interventional Cardiology

## 2013-02-08 VITALS — BP 124/80 | HR 106 | Ht 65.0 in | Wt 152.0 lb

## 2013-02-08 DIAGNOSIS — I10 Essential (primary) hypertension: Secondary | ICD-10-CM | POA: Insufficient documentation

## 2013-02-08 DIAGNOSIS — I4891 Unspecified atrial fibrillation: Secondary | ICD-10-CM

## 2013-02-08 DIAGNOSIS — R4181 Age-related cognitive decline: Secondary | ICD-10-CM

## 2013-02-08 DIAGNOSIS — I5032 Chronic diastolic (congestive) heart failure: Secondary | ICD-10-CM | POA: Insufficient documentation

## 2013-02-08 DIAGNOSIS — R54 Age-related physical debility: Secondary | ICD-10-CM

## 2013-02-08 LAB — PACEMAKER DEVICE OBSERVATION
AL AMPLITUDE: 0.5 mv
AL IMPEDENCE PM: 363 Ohm
BAMS-0001: 140 {beats}/min
BATTERY VOLTAGE: 2.75 V
RV LEAD AMPLITUDE: 4.2 mv
RV LEAD IMPEDENCE PM: 525 Ohm
VENTRICULAR PACING PM: 88

## 2013-02-08 MED ORDER — DIGOXIN 125 MCG PO TABS: 0.1250 mg | ORAL_TABLET | Freq: Every day | ORAL | Status: DC

## 2013-02-08 NOTE — Progress Notes (Signed)
Pacemaker check in clinic. Normal device function. Thresholds, sensing, impedances consistent with previous measurements. Device programmed to maximize longevity.  100% A-fib, with 10% of her heart rates > 100bpm. Dr. Katrinka Blazing is aware of this.   The patient is intollerant of coumadin.   Device programmed at appropriate safety margins. Base rate was reprogrammed to 60 bpm today.   Device programmed to optimize intrinsic conduction. Estimated longevity 4.25 years.  Plan to follow every 3 months remotely and see annually in office. Patient education completed.  ROV in January with Dr. Johney Frame.

## 2013-02-08 NOTE — Patient Instructions (Addendum)
START DIGOXIN 0.125MG  DAILY( 1 TABLET)  Your physician recommends that you schedule a follow-up appointment in: 3 MONTHS WITH DR.SMITH

## 2013-02-08 NOTE — Progress Notes (Signed)
Patient ID: [name], female   DOB: 10/16/1919, 79 y.o.   MRN: [id number]    HPI:   [name] is accompanied by her daughter and sister. Her complaints include fatigue, palpitations, and breathlessness. She also complains of dizziness. She has been found to have middle ear disease. No other complaints have been chronic. She seems to feel as though they are worse recently. She denies orthopnea, PND, lower extremity edema, and chest pain. We discontinued amlodipine several weeks ago because we felt he could be contributing to her dizziness. It has not made any difference. Her blood pressure has not suffered because of the medication discontinuation.  Palpitations and racing. Heart is her major complaint.  Is very distracting. She wants something done about it.  Allergies  Allergen Reactions  . Quinidine Other (See Comments)    Skin peels  . Tiazac [Diltiazem Hcl] Hives    "don't remember how bad the reaction was" (04/24/2012)  . Warfarin Sodium Other (See Comments)    GI bleed  . Percocet [Oxycodone-Acetaminophen] Other (See Comments)    confusion  . Promethazine Hcl Other (See Comments)    confusion  . Amiodarone   . Phenergan [Promethazine Hcl]   . Procainamide Other (See Comments)    Lupus reaction  . Septra [Sulfamethoxazole W/Trimethoprim (Co-Trimoxazole)] Other (See Comments)    Dizziness and tremor "don't remember how bad the reaction was" (04/24/2012)  . Amoxicillin Rash  . Ciprofloxacin Rash  . Polocaine [Mepivacaine Hcl] Palpitations    Current Outpatient Prescriptions  Medication Sig Dispense Refill  . acetaminophen (TYLENOL) 650 MG CR tablet Take 650 mg by mouth 2 (two) times daily.       Marland Kitchen atenolol (TENORMIN) 25 MG tablet Take 1 tablet (25 mg total) by mouth 2 (two) times daily.  30 tablet  0  . Cholecalciferol (VITAMIN D) 2000 UNITS tablet Take 2,000 Units by mouth daily.      . Cranberry 450 MG TABS Take 1 tablet by mouth daily.      . Cyanocobalamin  (VITAMIN B-12 PO) Take 1,000 mcg by mouth daily.       Marland Kitchen estradiol (ESTRACE) 0.1 MG/GM vaginal cream Place 2 g vaginally daily.      . famotidine (PEPCID) 20 MG tablet Take 20 mg by mouth 2 (two) times daily.      . furosemide (LASIX) 80 MG tablet Take 40-80 mg by mouth 2 (two) times daily. Takes 80 mg in the morning and 40 mg at 2 pm      . hyoscyamine (LEVSIN/SL) 0.125 MG SL tablet Place 1 tablet (0.125 mg total) under the tongue every 4 (four) hours as needed for cramping.  15 tablet  0  . ibuprofen (ADVIL,MOTRIN) 100 MG tablet Take 100 mg by mouth every 8 (eight) hours as needed for fever.      . lactose free nutrition (BOOST PLUS) LIQD Take 237 mLs by mouth 2 (two) times daily between meals.       Marland Kitchen levothyroxine (SYNTHROID, LEVOTHROID) 88 MCG tablet Take 88 mcg by mouth daily before breakfast.      . loratadine (CLARITIN) 10 MG tablet Take 10 mg by mouth as needed for allergies.      . magnesium hydroxide (MILK OF MAGNESIA) 400 MG/5ML suspension Take 30 mLs by mouth daily as needed for constipation.      . Multiple Vitamins-Minerals (ICAPS PO) Take 1 capsule by mouth daily.      . NON FORMULARY Ultra Flora IB once a  day      . Omeprazole (PRILOSEC PO) Take 20 mg by mouth daily. 2 am before breakfast      . potassium chloride (K-DUR,KLOR-CON) 10 MEQ tablet Take 10 mEq by mouth daily.      Marland Kitchen spironolactone (ALDACTONE) 25 MG tablet Take 25 mg by mouth every morning.      . traMADol (ULTRAM) 50 MG tablet Take 50 mg by mouth at bedtime as needed for pain.      Marland Kitchen TRIMETHOPRIM PO Take 50 mg by mouth every morning.      Marland Kitchen buPROPion (WELLBUTRIN XL) 150 MG 24 hr tablet Take 300 mg by mouth daily.       No current facility-administered medications for this visit.    Past Medical History  Diagnosis Date  . Hypertension   . Osteoporosis   . Atrial fib/flutter, transient   . CHF (congestive heart failure)   . Vertigo   . Chronic renal insufficiency   . Hypothyroidism   . Arthritis   . Anxiety    . On home oxygen therapy   . Tachy-brady syndrome      treated with amiodarone and permanent DDD pacemaker, 1993    Past Surgical History  Procedure Laterality Date  . Knee arthroscopy      right  . Joint replacement    . Insert / replace / remove pacemaker    . Total hip arthroplasty      right  . Esophagogastroduodenoscopy N/A 11/26/2012    Procedure: ESOPHAGOGASTRODUODENOSCOPY (EGD);  Surgeon: Charna Elizabeth, MD;  Location: [facility];  Service: Endoscopy;  Laterality: N/A;    ROS: No transient neurological symptoms. No blood in the urine or stool. She has not had headache, skin rash, falls, or head trauma.   PHYSICAL EXAM BP 124/80  Pulse 106  Ht 5\' 5"  (1.651 m)  Wt 152 lb (68.947 kg)  BMI 25.29 kg/m2   Frail appearing. No acute distress.  No JVD is noted. Carotid bruits are not heard.  Chest good auscultation and percussion.  Cardiac exam reveals no gallop, rub, click, or murmur. Rhythm is irregularly irregular.  The neurological exam is unremarkable.   EKG: Not performed. Pacemaker interrogation was done today and we have decided to decrease the pacer rate.   ASSESSMENT/ PLAN:  1. Chronic atrial fibrillation.  The patient complains of palpitations. Her daughter has monitored. The heart rate and it can be as high as 100 beats per minute at rest. [name] is aware of the increased heart rate and she feels there is increased fatigue. Digoxin 0.125 mg daily is added to improve. Rate control. We also decreased background pacing rate to 60 beats per minute.  2. Chronic diastolic heart failure.  Diastolic heart failure is potentially aggravated by the poor ventricular rate control.  3. Hypertension.  The blood pressure is under excellent control  4. Frailty  This is the patient's major problem at her current age of 79. I reassured her and encouraged as much activity as possible. She seems to be significantly depressed related to the chronic debility and  discomforts associated with aging.

## 2013-02-28 ENCOUNTER — Encounter: Payer: Self-pay | Admitting: Internal Medicine

## 2013-03-06 ENCOUNTER — Other Ambulatory Visit: Payer: Self-pay | Admitting: Gastroenterology

## 2013-03-06 ENCOUNTER — Encounter (HOSPITAL_COMMUNITY): Payer: Self-pay | Admitting: *Deleted

## 2013-03-06 ENCOUNTER — Encounter (HOSPITAL_COMMUNITY): Payer: Self-pay | Admitting: Pharmacy Technician

## 2013-03-07 NOTE — Progress Notes (Signed)
03-07-13 1045 Spoke with Kennon Holter for [employer]-informed of cardiac orders signed off response. Will call back to let me know if he is needed day of, procedure/time given. W. Kennon Portela

## 2013-03-08 ENCOUNTER — Ambulatory Visit (HOSPITAL_COMMUNITY)
Admission: RE | Admit: 2013-03-08 | Discharge: 2013-03-08 | Disposition: A | Discharge status: Home / Self Care | Payer: MEDICARE | Source: Ambulatory Visit | Attending: Gastroenterology | Admitting: Gastroenterology

## 2013-03-08 ENCOUNTER — Encounter (HOSPITAL_COMMUNITY)
Admission: RE | Disposition: A | Discharge status: Home / Self Care | Payer: Self-pay | Source: Ambulatory Visit | Attending: Gastroenterology

## 2013-03-08 ENCOUNTER — Ambulatory Visit (HOSPITAL_COMMUNITY): Payer: MEDICARE | Admitting: Anesthesiology

## 2013-03-08 ENCOUNTER — Encounter (HOSPITAL_COMMUNITY): Payer: Self-pay | Admitting: Anesthesiology

## 2013-03-08 ENCOUNTER — Encounter (HOSPITAL_COMMUNITY): Payer: MEDICARE | Admitting: Anesthesiology

## 2013-03-08 DIAGNOSIS — K449 Diaphragmatic hernia without obstruction or gangrene: Secondary | ICD-10-CM | POA: Insufficient documentation

## 2013-03-08 DIAGNOSIS — Z950 Presence of cardiac pacemaker: Secondary | ICD-10-CM | POA: Insufficient documentation

## 2013-03-08 DIAGNOSIS — Z9981 Dependence on supplemental oxygen: Secondary | ICD-10-CM | POA: Insufficient documentation

## 2013-03-08 DIAGNOSIS — Z96649 Presence of unspecified artificial hip joint: Secondary | ICD-10-CM | POA: Insufficient documentation

## 2013-03-08 DIAGNOSIS — I129 Hypertensive chronic kidney disease with stage 1 through stage 4 chronic kidney disease, or unspecified chronic kidney disease: Secondary | ICD-10-CM | POA: Insufficient documentation

## 2013-03-08 DIAGNOSIS — R1310 Dysphagia, unspecified: Secondary | ICD-10-CM | POA: Insufficient documentation

## 2013-03-08 DIAGNOSIS — R05 Cough: Secondary | ICD-10-CM | POA: Diagnosis not present

## 2013-03-08 DIAGNOSIS — R059 Cough, unspecified: Secondary | ICD-10-CM | POA: Insufficient documentation

## 2013-03-08 DIAGNOSIS — I252 Old myocardial infarction: Secondary | ICD-10-CM | POA: Insufficient documentation

## 2013-03-08 DIAGNOSIS — N189 Chronic kidney disease, unspecified: Secondary | ICD-10-CM | POA: Insufficient documentation

## 2013-03-08 DIAGNOSIS — M810 Age-related osteoporosis without current pathological fracture: Secondary | ICD-10-CM | POA: Insufficient documentation

## 2013-03-08 DIAGNOSIS — E039 Hypothyroidism, unspecified: Secondary | ICD-10-CM | POA: Insufficient documentation

## 2013-03-08 HISTORY — PX: ESOPHAGOGASTRODUODENOSCOPY: SHX5428

## 2013-03-08 SURGERY — EGD (ESOPHAGOGASTRODUODENOSCOPY)
Anesthesia: Monitor Anesthesia Care

## 2013-03-08 MED ORDER — SODIUM CHLORIDE 0.9 % IV SOLN: INTRAVENOUS | Status: DC

## 2013-03-08 MED ORDER — SIMETHICONE 40 MG/0.6ML PO SUSP
ORAL | Status: AC
  Filled 2013-03-08: qty 30

## 2013-03-08 MED ORDER — PROPOFOL INFUSION 10 MG/ML OPTIME
INTRAVENOUS | Status: DC | PRN
  Administered 2013-03-08: 300 ug/kg/min via INTRAVENOUS

## 2013-03-08 MED ORDER — LACTATED RINGERS IV SOLN
INTRAVENOUS | Status: DC | PRN
  Administered 2013-03-08: 13:00:00 via INTRAVENOUS

## 2013-03-08 NOTE — Op Note (Signed)
[facility] [address] [place], [address]   OPERATIVE PROCEDURE REPORT  PATIENT: Danielle, Melton  MR#: [id number] BIRTHDATE: April 26, 1920  GENDER: Female ENDOSCOPIST: Jeani Hawking, MD ASSISTANT:   Nada Libman, RN and Windell Hummingbird, technician PROCEDURE DATE: 03/08/2013 PROCEDURE:   EGD ASA CLASS:   IV INDICATIONS:Cough and Dysphagia MEDICATIONS: MAC sedation, administered by CRNA TOPICAL ANESTHETIC:   none  DESCRIPTION OF PROCEDURE:   After the risks benefits and alternatives of the procedure were thoroughly explained, informed consent was obtained.  The Pentax Gastroscope [id number]  endoscope was introduced through the mouth  and advanced to the second portion of the duodenum Without limitations.      The instrument was slowly withdrawn as the mucosa was fully examined.      FINDINGS: Upon initial entry into the esophagus there was a significant amout of refluxate that was quickly suctioned.  In the distal esophagus there was evidence of a 3-4 cm hiatal hernia, but no evidence of any esophagitis.  The gastric lumen was negative for any ulcerations, which was previously noted by Dr. Loreta Ave.  No duodenal abnormalities identified.          The scope was then withdrawn from the patient and the procedure terminated.  COMPLICATIONS: There were no complications. IMPRESSION: 1) Hiatal hernia.  RECOMMENDATIONS: 1) Continue with Dexilant and take the medication 30 minutes before breakfast. 2) Avoid eating 3-4 hours before going to sleep. 3) Consider cardiac work up for CHF as being a cause for cough.   _______________________________ eSigned:  Jeani Hawking, MD 03/08/2013 2:16 PM

## 2013-03-08 NOTE — H&P (Signed)
   [name] HPI: The patient was recently evaluated by Dr. Loreta Ave for complaints of bloating and mid abdominal pain in July, however, within the past 1-2 months she started to have issues with cough.  This started after she ate at [facility].  Since that time she has a globus sensation and a persistent cough.  She was started on Dexilant two weeks ago, but there is no significant improvement in her symptoms.  There is also a pill dysphagia complaint.  Past Medical History  Diagnosis Date  . Hypertension   . Osteoporosis   . Atrial fib/flutter, transient   . CHF (congestive heart failure)   . Vertigo   . Chronic renal insufficiency   . Hypothyroidism   . Arthritis   . Anxiety   . On home oxygen therapy     uses O2 @ 2l/m nasally bedtime  . Tachy-brady syndrome      treated with amiodarone and permanent DDD pacemaker, 1993    Past Surgical History  Procedure Laterality Date  . Knee arthroscopy      right  . Insert / replace / remove pacemaker    . Total hip arthroplasty      right  . Esophagogastroduodenoscopy N/A 11/26/2012    Procedure: ESOPHAGOGASTRODUODENOSCOPY (EGD);  Surgeon: Charna Elizabeth, MD;  Location: [facility];  Service: Endoscopy;  Laterality: N/A;  . Joint replacement      rt hip  . Shoulder surgery      Family History  Problem Relation Age of Onset  . Hypertension Mother   . Heart disease Mother     Heart failure (vague history)    Social History:  reports that she has never smoked. She has never used smokeless tobacco. She reports that she does not drink alcohol or use illicit drugs.  Allergies:  Allergies  Allergen Reactions  . Quinidine Other (See Comments)    Skin peels  . Tiazac [Diltiazem Hcl] Hives    "don't remember how bad the reaction was" (04/24/2012)  . Warfarin Sodium Other (See Comments)    GI bleed  . Percocet [Oxycodone-Acetaminophen] Other (See Comments)    confusion  . Promethazine Hcl Other (See Comments)    confusion  .  Amiodarone   . Procainamide Other (See Comments)    Lupus reaction  . Septra [Sulfamethoxazole W/Trimethoprim (Co-Trimoxazole)] Other (See Comments)    Dizziness and tremor "don't remember how bad the reaction was" (04/24/2012)  . Amoxicillin Rash  . Ciprofloxacin Rash  . Polocaine [Mepivacaine Hcl] Palpitations    Medications:  Scheduled:  Continuous: . sodium chloride      No results found for this or any previous visit (from the past 24 hour(s)).   No results found.  ROS:  As stated above in the HPI otherwise negative.  There were no vitals taken for this visit.    PE: Gen: NAD, Alert and Oriented HEENT:  [place]/AT, EOMI Neck: Supple, no LAD Lungs: CTA Bilaterally CV: RRR without M/G/R ABM: Soft, NTND, +BS Ext: No C/C/E  Assessment/Plan: 1) Cough. 2) Dysphagia. 3) Globus.   Per Anesthesiology the patient appears to be slightly volume overloaded.  There is a history of CHF.  If her EGD is negative, cough can be a manifestation of CHF.  I will make further recommendations pending the findings.  Plan: 1) EGD now.  Light propofol will be administered.  Franck Vinal D 03/08/2013, 1:43 PM

## 2013-03-08 NOTE — Anesthesia Postprocedure Evaluation (Signed)
Anesthesia Post Note  Patient: [name]  Procedure(s) Performed: Procedure(s) (LRB): ESOPHAGOGASTRODUODENOSCOPY (EGD) (N/A)  Anesthesia type: MAC  Patient location: PACU  Post pain: Pain level controlled  Post assessment: Post-op Vital signs reviewed  Last Vitals:  Filed Vitals:   03/08/13 1443  BP: 139/64  Resp: 17    Post vital signs: Reviewed  Level of consciousness: sedated  Complications: No apparent anesthesia complications

## 2013-03-08 NOTE — Preoperative (Signed)
Beta Blockers   Reason not to administer Beta Blockers:Not Applicable 

## 2013-03-08 NOTE — Anesthesia Preprocedure Evaluation (Addendum)
Anesthesia Evaluation  Patient identified by MRN, date of birth, ID band Patient awake    Reviewed: Allergy & Precautions, H&P , NPO status , Patient's Chart, lab work & pertinent test results  Airway Mallampati: II TM Distance: >3 FB Neck ROM: Full    Dental no notable dental hx.    Pulmonary  Cough.  Oxygen saturations 97% on 2 l/min [place], and was 94% on room air at home this AM. breath sounds clear to auscultation  Pulmonary exam normal       Cardiovascular hypertension, Pt. on medications + CAD, + Past MI and +CHF + dysrhythmias Atrial Fibrillation Rhythm:Regular Rate:Normal     Neuro/Psych Anxiety negative neurological ROS     GI/Hepatic negative GI ROS, Neg liver ROS,   Endo/Other  Hypothyroidism   Renal/GU Renal InsufficiencyRenal disease  negative genitourinary   Musculoskeletal negative musculoskeletal ROS (+)   Abdominal   Peds negative pediatric ROS (+)  Hematology negative hematology ROS (+)   Anesthesia Other Findings   Reproductive/Obstetrics negative OB ROS                         Anesthesia Physical Anesthesia Plan  ASA: IV  Anesthesia Plan: MAC   Post-op Pain Management:    Induction: Intravenous  Airway Management Planned:   Additional Equipment:   Intra-op Plan:   Post-operative Plan:   Informed Consent: I have reviewed the patients History and Physical, chart, labs and discussed the procedure including the risks, benefits and alternatives for the proposed anesthesia with the patient or authorized representative who has indicated his/her understanding and acceptance.   Dental advisory given  Plan Discussed with: CRNA  Anesthesia Plan Comments: (Worsened cough last 2 days. Weight stable per daughter RN, and had good urine output last night. Had her regular lasix last night. Did not take her dose this AM. Planned procedure is about 5 minutes. Discussed with Dr.  Elnoria Howard. Plan quick light sedation with minimal IV fluids. Lungs sound clear with minimal crackles only at bases. No obvious respiratory distress. Patient and her daughters agree with plan in order to get a diagnosis.)       Anesthesia Quick Evaluation

## 2013-03-08 NOTE — Transfer of Care (Signed)
Immediate Anesthesia Transfer of Care Note  Patient: [name]  Procedure(s) Performed: Procedure(s): ESOPHAGOGASTRODUODENOSCOPY (EGD) (N/A)  Patient Location: PACU and Endoscopy Unit  Anesthesia Type:MAC  Level of Consciousness: sedated and patient cooperative  Airway & Oxygen Therapy: Patient Spontanous Breathing and Patient connected to nasal cannula oxygen  Post-op Assessment: Report given to PACU RN and Post -op Vital signs reviewed and stable  Post vital signs: Reviewed and stable  Complications: No apparent anesthesia complications

## 2013-03-11 ENCOUNTER — Encounter (HOSPITAL_COMMUNITY): Payer: Self-pay | Admitting: Gastroenterology

## 2013-03-12 ENCOUNTER — Telehealth: Payer: Self-pay | Admitting: Interventional Cardiology

## 2013-03-12 DIAGNOSIS — I4891 Unspecified atrial fibrillation: Secondary | ICD-10-CM

## 2013-03-12 NOTE — Telephone Encounter (Signed)
Ok for pt to take dexilant per Dr.Smith.Pt daughter adv to have pt come in to have a digoxin level lab tomorrow 03/13/13 bet 1-2 pm. Pt daughter verbalized understanding

## 2013-03-12 NOTE — Telephone Encounter (Signed)
New message    GI doc put her on dexilant--is this ok to take with her digoxin?

## 2013-03-13 ENCOUNTER — Ambulatory Visit: Payer: MEDICARE | Admitting: *Deleted

## 2013-03-13 DIAGNOSIS — Z79899 Other long term (current) drug therapy: Secondary | ICD-10-CM

## 2013-03-13 DIAGNOSIS — R531 Weakness: Secondary | ICD-10-CM

## 2013-03-13 DIAGNOSIS — I4891 Unspecified atrial fibrillation: Secondary | ICD-10-CM

## 2013-03-13 DIAGNOSIS — I509 Heart failure, unspecified: Secondary | ICD-10-CM

## 2013-03-14 ENCOUNTER — Other Ambulatory Visit: Payer: Self-pay | Admitting: *Deleted

## 2013-03-14 ENCOUNTER — Telehealth: Payer: Self-pay | Admitting: *Deleted

## 2013-03-14 DIAGNOSIS — R7889 Finding of other specified substances, not normally found in blood: Secondary | ICD-10-CM

## 2013-03-14 LAB — DIGOXIN LEVEL: Digoxin Level: 2.3 ng/mL — ABNORMAL HIGH (ref 0.8–2.0)

## 2013-03-14 MED ORDER — DIGOXIN 125 MCG PO TABS: ORAL_TABLET | ORAL | Status: DC

## 2013-03-14 NOTE — Telephone Encounter (Signed)
Pt. Digoxin level 2.3 Dr. Melburn Popper DOD recommended to decreased the medication dose in 1/2; pt is th take 1/2 of tablet of 0.125 mg once a day. And to recheck level in one month. Pt has an appointment for digoxin level on December 5 th at 2:00 PM pt's daughter aware of DOD's recommendations and appointment.

## 2013-03-20 ENCOUNTER — Other Ambulatory Visit: Payer: Self-pay | Admitting: *Deleted

## 2013-03-20 ENCOUNTER — Other Ambulatory Visit: Payer: Self-pay

## 2013-03-20 ENCOUNTER — Telehealth: Payer: Self-pay | Admitting: Internal Medicine

## 2013-03-20 MED ORDER — ATENOLOL 25 MG PO TABS: 25.0000 mg | ORAL_TABLET | Freq: Two times a day (BID) | ORAL | Status: DC

## 2013-03-20 MED ORDER — POTASSIUM CHLORIDE CRYS ER 10 MEQ PO TBCR: 10.0000 meq | EXTENDED_RELEASE_TABLET | Freq: Every day | ORAL | Status: DC

## 2013-03-20 MED ORDER — LEVOTHYROXINE SODIUM 88 MCG PO TABS: 88.0000 ug | ORAL_TABLET | Freq: Every day | ORAL | Status: DC

## 2013-03-20 NOTE — Telephone Encounter (Signed)
Pt daughter brought in a handicap placard.please fill out and call when ready

## 2013-03-21 ENCOUNTER — Ambulatory Visit
Admission: RE | Admit: 2013-03-21 | Discharge: 2013-03-21 | Disposition: A | Discharge status: Home / Self Care | Payer: MEDICARE | Source: Ambulatory Visit | Attending: Family Medicine | Admitting: Family Medicine

## 2013-03-21 ENCOUNTER — Encounter: Payer: Self-pay | Admitting: Family Medicine

## 2013-03-21 ENCOUNTER — Ambulatory Visit (INDEPENDENT_AMBULATORY_CARE_PROVIDER_SITE_OTHER): Payer: MEDICARE | Admitting: Family Medicine

## 2013-03-21 VITALS — BP 140/84 | HR 70

## 2013-03-21 DIAGNOSIS — K449 Diaphragmatic hernia without obstruction or gangrene: Secondary | ICD-10-CM

## 2013-03-21 DIAGNOSIS — R05 Cough: Secondary | ICD-10-CM

## 2013-03-21 MED ORDER — BENZONATATE 100 MG PO CAPS: 100.0000 mg | ORAL_CAPSULE | Freq: Two times a day (BID) | ORAL | Status: DC | PRN

## 2013-03-21 NOTE — Progress Notes (Signed)
  Subjective:    Patient ID: [name], female    DOB: 03/28/1920, 79 y.o.   MRN: [id number]  HPI Was seen recently by Dr. Elnoria Howard and evaluated for a feeling of food getting stuck. She did have a hiatus hernia. In the last 2 weeks she has had difficulty with intermittent cough, shortness of breath. No chest pain. She's also noted some pedal edema. Coughing is noted when she eats or drinks anything. She continues on Dexilant. She is on oxygen therapy and with physical activities she does desaturate.   Review of Systems     Objective:   Physical Exam Alert and slightly tachypneic. She is using oxygen and has a pulse ox of 93. Lungs are clear to auscultation. Cardiac exam shows regular rhythm without murmurs or gallops. Extremity exam shows 1+ edema. Chest x-ray shows no acute changes      Assessment & Plan:  Cough - Plan: DG Chest 2 View, benzonatate (TESSALON) 100 MG capsule, DISCONTINUED: benzonatate (TESSALON) 100 MG capsule  HH (hiatus hernia) - Plan: DG Chest 2 View  since x-ray is negative, I will refer her for barium swallow to evaluate her swallowing function.

## 2013-03-22 ENCOUNTER — Telehealth: Payer: Self-pay | Admitting: Family Medicine

## 2013-03-22 ENCOUNTER — Other Ambulatory Visit: Payer: Self-pay

## 2013-03-22 ENCOUNTER — Telehealth: Payer: Self-pay

## 2013-03-22 ENCOUNTER — Telehealth: Payer: Self-pay | Admitting: Internal Medicine

## 2013-03-22 DIAGNOSIS — R059 Cough, unspecified: Secondary | ICD-10-CM

## 2013-03-22 DIAGNOSIS — R05 Cough: Secondary | ICD-10-CM

## 2013-03-22 MED ORDER — BENZONATATE 100 MG PO CAPS: 100.0000 mg | ORAL_CAPSULE | Freq: Two times a day (BID) | ORAL | Status: DC | PRN

## 2013-03-22 NOTE — Telephone Encounter (Signed)
[name] IS TAKING REGULAR HUMIDIFIER OVER TO SEE IF THAT WILL HELP WITH PT COUGH IT WAS JUST AN IDEA THAT [name] HAD I TOLD HER TO TRY THAT AND IF IT WORKS WE CAN GET HUMIDIFIER FOR CPAP

## 2013-03-22 NOTE — Telephone Encounter (Signed)
See if she's having difficulty with the oxygen flow rate causing irritability. If it is we can get the humidifier

## 2013-03-22 NOTE — Telephone Encounter (Signed)
CALLED TESSLON INTO [facility]

## 2013-03-22 NOTE — Telephone Encounter (Signed)
done

## 2013-03-22 NOTE — Telephone Encounter (Signed)
[name] has been informed of appointment 1 st floor radiology cone arrive at 10;15 for a 10:30 this is the swallowing test

## 2013-03-22 NOTE — Telephone Encounter (Signed)
Pt daughter wants to know if you think its a good idea to get a humidifier bottle to go oxygen. If so she would need an order for [facility] home [facility] to get it.

## 2013-03-26 ENCOUNTER — Other Ambulatory Visit: Payer: Self-pay

## 2013-03-26 ENCOUNTER — Ambulatory Visit (HOSPITAL_COMMUNITY)
Admission: RE | Admit: 2013-03-26 | Discharge: 2013-03-26 | Disposition: A | Discharge status: Home / Self Care | Payer: MEDICARE | Source: Ambulatory Visit | Attending: Family Medicine | Admitting: Family Medicine

## 2013-03-26 DIAGNOSIS — K449 Diaphragmatic hernia without obstruction or gangrene: Secondary | ICD-10-CM

## 2013-03-26 DIAGNOSIS — K219 Gastro-esophageal reflux disease without esophagitis: Secondary | ICD-10-CM | POA: Diagnosis not present

## 2013-03-26 DIAGNOSIS — R1310 Dysphagia, unspecified: Secondary | ICD-10-CM | POA: Insufficient documentation

## 2013-03-26 DIAGNOSIS — R059 Cough, unspecified: Secondary | ICD-10-CM | POA: Insufficient documentation

## 2013-03-26 DIAGNOSIS — R05 Cough: Secondary | ICD-10-CM | POA: Insufficient documentation

## 2013-03-28 ENCOUNTER — Telehealth: Payer: Self-pay

## 2013-03-28 ENCOUNTER — Telehealth: Payer: Self-pay | Admitting: Interventional Cardiology

## 2013-03-28 NOTE — Telephone Encounter (Signed)
New message     Talk to nurse about fluid build up----coughing and edema in legs

## 2013-03-28 NOTE — Telephone Encounter (Signed)
I spoke with [name]. She states that about 2 weeks ago [name] felt that something was stuck in her throat. She developed a cough, but was already scheduled for endoscopy with Dr. Loreta Ave. Reflux was diagnosed and [name] was started on dexilant. Due to Danielle cough, [name] saw Dr. Susann Givens. He did a chest x-ray that was clear, but started her on a cough suppressant. 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 They do drop after she has had a significant amount of coughing. [name] is concerned now since [name] has developed lower extremity edema. She is on lasix and spironolactone with a history of diastolic heart failure. With Danielle persistant cough and edema that has developed, [name] is concerned that [name] may be having heart failure symptoms. Her weight is stable between 146-148 lbs. She has a decreased appetite. Per [name], Dr. Katrinka Blazing had d/c'ed Danielle patients amlodipine in Danielle past and restarted her digoxin. [name]'s BP started to go up and therefore [name] restarted [name]'s amlodipine at 5 mg once daily. I advised her that amlodipine can sometimes have a side effect of edema. [name]'s BP is now 140/80 with HR in Danielle 60-70's. I offered that I can speak with Danielle DOD to see if her medications can be readjusted, or that they may come in for an office visit. [name] has chosen to have [name] seen. Appointment scheduled with Norma Fredrickson, NP at 3:00 pm tomorrow. She is agreeable.

## 2013-03-28 NOTE — Telephone Encounter (Signed)
refill 

## 2013-03-29 ENCOUNTER — Ambulatory Visit (INDEPENDENT_AMBULATORY_CARE_PROVIDER_SITE_OTHER): Payer: MEDICARE | Admitting: Nurse Practitioner

## 2013-03-29 ENCOUNTER — Encounter: Payer: Self-pay | Admitting: Nurse Practitioner

## 2013-03-29 VITALS — BP 160/80 | HR 64 | Ht 65.0 in | Wt 152.1 lb

## 2013-03-29 DIAGNOSIS — R059 Cough, unspecified: Secondary | ICD-10-CM

## 2013-03-29 DIAGNOSIS — I5032 Chronic diastolic (congestive) heart failure: Secondary | ICD-10-CM

## 2013-03-29 DIAGNOSIS — R05 Cough: Secondary | ICD-10-CM

## 2013-03-29 LAB — CBC WITH DIFFERENTIAL/PLATELET
Basophils Absolute: 0.1 10*3/uL (ref 0.0–0.1)
Basophils Relative: 0.5 % (ref 0.0–3.0)
Eosinophils Absolute: 0.2 10*3/uL (ref 0.0–0.7)
Eosinophils Relative: 1.8 % (ref 0.0–5.0)
HCT: 39.4 % (ref 36.0–46.0)
Hemoglobin: 13.1 g/dL (ref 12.0–15.0)
Lymphocytes Relative: 14.6 % (ref 12.0–46.0)
Lymphs Abs: 1.6 10*3/uL (ref 0.7–4.0)
MCHC: 33.4 g/dL (ref 30.0–36.0)
MCV: 95.4 fl (ref 78.0–100.0)
Monocytes Absolute: 1 10*3/uL (ref 0.1–1.0)
Monocytes Relative: 9.1 % (ref 3.0–12.0)
Neutro Abs: 8.1 10*3/uL — ABNORMAL HIGH (ref 1.4–7.7)
Neutrophils Relative %: 74 % (ref 43.0–77.0)
Platelets: 290 10*3/uL (ref 150.0–400.0)
RBC: 4.13 Mil/uL (ref 3.87–5.11)
RDW: 16.3 % — ABNORMAL HIGH (ref 11.5–14.6)
WBC: 10.9 10*3/uL — ABNORMAL HIGH (ref 4.5–10.5)

## 2013-03-29 LAB — BASIC METABOLIC PANEL
BUN: 34 mg/dL — ABNORMAL HIGH (ref 6–23)
CO2: 24 mEq/L (ref 19–32)
Calcium: 9.4 mg/dL (ref 8.4–10.5)
Chloride: 102 mEq/L (ref 96–112)
Creatinine, Ser: 2.1 mg/dL — ABNORMAL HIGH (ref 0.4–1.2)
GFR: 23.72 mL/min — ABNORMAL LOW (ref >60.00)
Glucose, Bld: 92 mg/dL (ref 70–99)
Potassium: 4.4 mEq/L (ref 3.5–5.1)
Sodium: 135 mEq/L (ref 135–145)

## 2013-03-29 LAB — BRAIN NATRIURETIC PEPTIDE: Pro B Natriuretic peptide (BNP): 355 pg/mL — ABNORMAL HIGH (ref 0.0–100.0)

## 2013-03-29 MED ORDER — HYDROCOD POLST-CHLORPHEN POLST 10-8 MG/5ML PO LQCR: 5.0000 mL | Freq: Two times a day (BID) | ORAL | Status: DC | PRN

## 2013-03-29 NOTE — Progress Notes (Signed)
[name] Date of Birth: [birth year] Medical Record #[id number]  History of Present Illness: Ms. Codd is seen back today for a work in visit. Seen for Dr. Katrinka Blazing. She is 79 years of age. She has HTN, PAF/flutter, diastolic HF, vertigo, CKD, hypothyroidism, OA and tachy brady syndrome and has PPM in place - was previously on amiodarone.   Just seen here early October. Was complaining of palpitations - poor rate contol with her atrial fib and digoxin was added. Dr. Katrinka Blazing felt that her biggest issue was her age - she seemed to be significantly depressed related to the chronic disability and discomforts with aging.   Called yesterday - had a cough 2 weeks ago - felt like something was stuck in her throat - reflux diagnosed - but continued to cough - had a CXR by PCP - then started swelling. Family was concerned about heart failure. Family restarted her Norvasc due to labile BP. Thus added to my schedule today.   Comes in today. Here with 2 family members. Has her oxygen in place. Has continued with this persistent cough - she keeps telling me over and over during the course of the visit that she needs something for the cough. Was on dexilant but now on prilosec due to interaction with her digoxin - this has not helped her cough. Given some hydrocodone with honey that did help. Family worried that this is heart failure. Weight is fairly stable. Her cough is constant, dry and hacking. Not on ACE. The cough makes her short of breath. No chest pain. She does not mention her palpitations. No fever or chills. Has been given extra Lasix as well - this did make her have more urine output but did not help the cough.   Current Outpatient Prescriptions  Medication Sig Dispense Refill  . acetaminophen (TYLENOL ARTHRITIS PAIN) 650 MG CR tablet Take 650 mg by mouth every 8 (eight) hours as needed for pain.      Marland Kitchen acetaminophen (TYLENOL) 650 MG CR tablet Take 650 mg by mouth 2 (two) times daily.       Marland Kitchen  amLODipine (NORVASC) 5 MG tablet Take 5 mg by mouth daily.      Marland Kitchen aspirin 81 MG tablet Take 81 mg by mouth daily.      Marland Kitchen atenolol (TENORMIN) 25 MG tablet Take 1 tablet (25 mg total) by mouth 2 (two) times daily.  90 tablet  3  . benzonatate (TESSALON) 100 MG capsule Take 1 capsule (100 mg total) by mouth 2 (two) times daily as needed for cough.  30 capsule  0  . buPROPion (WELLBUTRIN XL) 150 MG 24 hr tablet Take 300 mg by mouth daily.      . Cholecalciferol (VITAMIN D) 2000 UNITS tablet Take 2,000 Units by mouth daily.      . Cranberry 450 MG TABS Take 1 tablet by mouth daily.      . Cyanocobalamin (VITAMIN B-12 PO) Take 1,000 mcg by mouth daily.       . digoxin (LANOXIN) 0.125 MG tablet Pt to take 1/2 tablet once a day.      . estradiol (ESTRACE) 0.1 MG/GM vaginal cream Place 2 g vaginally daily.      . furosemide (LASIX) 80 MG tablet Take 80 mg by mouth 2 (two) times daily.       . hyoscyamine (LEVSIN/SL) 0.125 MG SL tablet Place 1 tablet (0.125 mg total) under the tongue every 4 (four) hours as needed for cramping.  15 tablet  0  . lactose free nutrition (BOOST PLUS) LIQD Take 237 mLs by mouth 2 (two) times daily between meals.       Marland Kitchen levothyroxine (SYNTHROID, LEVOTHROID) 88 MCG tablet Take 1 tablet (88 mcg total) by mouth daily before breakfast.  90 tablet  0  . loratadine (CLARITIN) 10 MG tablet Take 10 mg by mouth as needed for allergies.      . magnesium hydroxide (MILK OF MAGNESIA) 400 MG/5ML suspension Take 30 mLs by mouth daily as needed for constipation.      . NON FORMULARY Take 1 tablet by mouth daily. Ultra Flora IB once a day      . NON FORMULARY Place 2 L into the nose daily.      . Omeprazole (PRILOSEC PO) Take 20 mg by mouth daily. 2 am before breakfast      . potassium chloride (K-DUR,KLOR-CON) 10 MEQ tablet Take 1 tablet (10 mEq total) by mouth daily.  90 tablet  3  . spironolactone (ALDACTONE) 25 MG tablet Take 25 mg by mouth every morning.      . traMADol (ULTRAM) 50 MG  tablet Take 50 mg by mouth at bedtime as needed for pain.      Marland Kitchen trimethoprim (TRIMPEX) 100 MG tablet Take 100 mg by mouth daily.      . Multiple Vitamins-Minerals (ICAPS PO) Take 1 capsule by mouth daily.       No current facility-administered medications for this visit.    Allergies  Allergen Reactions  . Quinidine Other (See Comments)    Skin peels  . Tiazac [Diltiazem Hcl] Hives    "don't remember how bad the reaction was" (04/24/2012)  . Warfarin Sodium Other (See Comments)    GI bleed  . Percocet [Oxycodone-Acetaminophen] Other (See Comments)    confusion  . Promethazine Hcl Other (See Comments)    confusion  . Amiodarone   . Procainamide Other (See Comments)    Lupus reaction  . Septra [Sulfamethoxazole W/Trimethoprim (Co-Trimoxazole)] Other (See Comments)    Dizziness and tremor "don't remember how bad the reaction was" (04/24/2012)  . Amoxicillin Rash  . Ciprofloxacin Rash  . Polocaine [Mepivacaine Hcl] Palpitations    Past Medical History  Diagnosis Date  . Hypertension   . Osteoporosis   . Atrial fib/flutter, transient   . CHF (congestive heart failure)   . Vertigo   . Chronic renal insufficiency   . Hypothyroidism   . Arthritis   . Anxiety   . On home oxygen therapy     uses O2 @ 2l/m nasally bedtime  . Tachy-brady syndrome      treated with amiodarone and permanent DDD pacemaker, 1993    Past Surgical History  Procedure Laterality Date  . Knee arthroscopy      right  . Insert / replace / remove pacemaker    . Total hip arthroplasty      right  . Esophagogastroduodenoscopy N/A 11/26/2012    Procedure: ESOPHAGOGASTRODUODENOSCOPY (EGD);  Surgeon: Charna Elizabeth, MD;  Location: [facility];  Service: Endoscopy;  Laterality: N/A;  . Joint replacement      rt hip  . Shoulder surgery    . Esophagogastroduodenoscopy N/A 03/08/2013    Procedure: ESOPHAGOGASTRODUODENOSCOPY (EGD);  Surgeon: Theda Belfast, MD;  Location: [facility];  Service: Endoscopy;   Laterality: N/A;    History  Smoking status  . Never Smoker   Smokeless tobacco  . Never Used    History  Alcohol Use No  Family History  Problem Relation Age of Onset  . Hypertension Mother   . Heart disease Mother     Heart failure (vague history)    Review of Systems: The review of systems is per the HPI.  All other systems were reviewed and are negative.  Physical Exam: BP 160/80  Pulse 64  Ht 5\' 5"  (1.651 m)  Wt 152 lb 1.9 oz (69.001 kg)  BMI 25.31 kg/m2 Patient is an elderly female who is very pleasant and in no acute distress. She has an almost constant dry cough. Skin is warm and dry. Color is normal.  HEENT is unremarkable. Normocephalic/atraumatic. PERRL. Sclera are nonicteric. Neck is supple. No masses. No JVD. Lungs are fairly clear. Cardiac exam shows an irregular rhythm. Abdomen is soft. Extremities are with trace ankle edema. Gait and ROM are intact but she moves slow - has a walker. No gross neurologic deficits noted.  Wt Readings from Last 3 Encounters:  03/29/13 152 lb 1.9 oz (69.001 kg)  02/08/13 152 lb (68.947 kg)  12/04/12 143 lb (64.864 kg)    LABORATORY DATA: PENDING  Lab Results  Component Value Date   WBC 10.8* 12/04/2012   HGB 15.3* 12/04/2012   HCT 46.5* 12/04/2012   PLT 256 12/04/2012   GLUCOSE 99 12/04/2012   ALT 14 12/04/2012   AST 19 12/04/2012   NA 135 12/04/2012   K 4.5 12/04/2012   CL 96 12/04/2012   CREATININE 1.67* 12/04/2012   BUN 32* 12/04/2012   CO2 25 12/04/2012   TSH 0.609 12/04/2012   INR 1.12 04/29/2010   HGBA1C 5.6 04/13/2012   Echo Study Conclusions from December 2013  - Left ventricle: The cavity size was normal. There was mild focal basal hypertrophy of the septum. Systolic function was normal. The estimated ejection fraction was in the range of 55% to 60%. Possible hypokinesis of the mid inferior myocardium. (Seen best on short axis view). Features are consistent with a pseudonormal left ventricular filling pattern,  with concomitant abnormal relaxation and increased filling pressure (grade 2 diastolic dysfunction). - Mitral valve: Calcified annulus. Mildly thickened, mildly calcified leaflets . Mild regurgitation. - Pulmonary arteries: Systolic pressure was mildly to moderately increased. PA peak pressure: 44mm Hg (S).  Dg Chest 2 View  03/21/2013     IMPRESSION: No active cardiopulmonary disease.   Electronically Signed   By: Elige Ko   On: 03/21/2013 16:21   Dg Esophagus  03/26/2013    IMPRESSION: No acute finding.  Mild esophageal dysmotility.   Electronically Signed   By: Drusilla Kanner M.D.   On: 03/26/2013 13:28    Assessment / Plan: 1. Diastolic HF - I really do not think this is from heart failure - I think she has a viral illness. Family noted some improvement with the narcotics/honey. We will recheck her labs today. For now, no change in the Lasix unless the labs are abnormal. I have given her some Tussionex to take BID prn - she has help around the clock. Encourage her to keep drinking water. May need to get back to primary care.   2. HTN   3. CKD  4. Advanced age   Patient is agreeable to this plan and will call if any problems develop in the interim.   Rosalio Macadamia, RN, ANP-C [facility] [address] [place], [place]  [address]

## 2013-03-29 NOTE — Patient Instructions (Addendum)
We need to check labs today  Continue with your current medicines but I am giving you some cough medicine to take twice a day as needed  For now, stay on your current dose of fluid pills  Call the [facility] office at [phone] if you have any questions, problems or concerns.

## 2013-03-31 ENCOUNTER — Emergency Department (HOSPITAL_COMMUNITY): Payer: MEDICARE

## 2013-03-31 ENCOUNTER — Inpatient Hospital Stay (HOSPITAL_COMMUNITY)
Admission: EM | Admit: 2013-03-31 | Discharge: 2013-04-05 | DRG: 291 | Disposition: A | Discharge status: Home Health | Payer: MEDICARE | Attending: Internal Medicine | Admitting: Internal Medicine

## 2013-03-31 ENCOUNTER — Encounter (HOSPITAL_COMMUNITY): Payer: Self-pay | Admitting: Emergency Medicine

## 2013-03-31 DIAGNOSIS — Z9981 Dependence on supplemental oxygen: Secondary | ICD-10-CM

## 2013-03-31 DIAGNOSIS — I4891 Unspecified atrial fibrillation: Secondary | ICD-10-CM | POA: Diagnosis present

## 2013-03-31 DIAGNOSIS — I5032 Chronic diastolic (congestive) heart failure: Secondary | ICD-10-CM | POA: Diagnosis present

## 2013-03-31 DIAGNOSIS — R54 Age-related physical debility: Secondary | ICD-10-CM

## 2013-03-31 DIAGNOSIS — Z79899 Other long term (current) drug therapy: Secondary | ICD-10-CM

## 2013-03-31 DIAGNOSIS — Z96649 Presence of unspecified artificial hip joint: Secondary | ICD-10-CM

## 2013-03-31 DIAGNOSIS — M129 Arthropathy, unspecified: Secondary | ICD-10-CM | POA: Diagnosis present

## 2013-03-31 DIAGNOSIS — R0902 Hypoxemia: Secondary | ICD-10-CM | POA: Diagnosis present

## 2013-03-31 DIAGNOSIS — E039 Hypothyroidism, unspecified: Secondary | ICD-10-CM | POA: Diagnosis present

## 2013-03-31 DIAGNOSIS — I10 Essential (primary) hypertension: Secondary | ICD-10-CM | POA: Diagnosis present

## 2013-03-31 DIAGNOSIS — N289 Disorder of kidney and ureter, unspecified: Secondary | ICD-10-CM

## 2013-03-31 DIAGNOSIS — J9620 Acute and chronic respiratory failure, unspecified whether with hypoxia or hypercapnia: Secondary | ICD-10-CM | POA: Diagnosis present

## 2013-03-31 DIAGNOSIS — R55 Syncope and collapse: Secondary | ICD-10-CM

## 2013-03-31 DIAGNOSIS — R42 Dizziness and giddiness: Secondary | ICD-10-CM | POA: Diagnosis present

## 2013-03-31 DIAGNOSIS — Z66 Do not resuscitate: Secondary | ICD-10-CM | POA: Diagnosis present

## 2013-03-31 DIAGNOSIS — Z8249 Family history of ischemic heart disease and other diseases of the circulatory system: Secondary | ICD-10-CM

## 2013-03-31 DIAGNOSIS — N390 Urinary tract infection, site not specified: Secondary | ICD-10-CM

## 2013-03-31 DIAGNOSIS — N179 Acute kidney failure, unspecified: Secondary | ICD-10-CM | POA: Diagnosis present

## 2013-03-31 DIAGNOSIS — I4892 Unspecified atrial flutter: Secondary | ICD-10-CM | POA: Diagnosis present

## 2013-03-31 DIAGNOSIS — I5033 Acute on chronic diastolic (congestive) heart failure: Principal | ICD-10-CM | POA: Diagnosis present

## 2013-03-31 DIAGNOSIS — M549 Dorsalgia, unspecified: Secondary | ICD-10-CM

## 2013-03-31 DIAGNOSIS — I1310 Hypertensive heart and chronic kidney disease without heart failure, with stage 1 through stage 4 chronic kidney disease, or unspecified chronic kidney disease: Secondary | ICD-10-CM | POA: Diagnosis present

## 2013-03-31 DIAGNOSIS — M1990 Unspecified osteoarthritis, unspecified site: Secondary | ICD-10-CM

## 2013-03-31 DIAGNOSIS — M810 Age-related osteoporosis without current pathological fracture: Secondary | ICD-10-CM | POA: Diagnosis present

## 2013-03-31 DIAGNOSIS — F411 Generalized anxiety disorder: Secondary | ICD-10-CM | POA: Diagnosis present

## 2013-03-31 DIAGNOSIS — I509 Heart failure, unspecified: Secondary | ICD-10-CM | POA: Diagnosis present

## 2013-03-31 DIAGNOSIS — I130 Hypertensive heart and chronic kidney disease with heart failure and stage 1 through stage 4 chronic kidney disease, or unspecified chronic kidney disease: Secondary | ICD-10-CM | POA: Diagnosis present

## 2013-03-31 DIAGNOSIS — Z950 Presence of cardiac pacemaker: Secondary | ICD-10-CM

## 2013-03-31 DIAGNOSIS — I5031 Acute diastolic (congestive) heart failure: Secondary | ICD-10-CM

## 2013-03-31 DIAGNOSIS — I214 Non-ST elevation (NSTEMI) myocardial infarction: Secondary | ICD-10-CM

## 2013-03-31 DIAGNOSIS — N183 Chronic kidney disease, stage 3 unspecified: Secondary | ICD-10-CM | POA: Diagnosis present

## 2013-03-31 DIAGNOSIS — Z7982 Long term (current) use of aspirin: Secondary | ICD-10-CM

## 2013-03-31 LAB — COMPREHENSIVE METABOLIC PANEL
ALT: 19 U/L (ref 0–35)
Alkaline Phosphatase: 68 U/L (ref 39–117)
BUN: 36 mg/dL — ABNORMAL HIGH (ref 6–23)
CO2: 22 mEq/L (ref 19–32)
Chloride: 100 mEq/L (ref 96–112)
Creatinine, Ser: 2.11 mg/dL — ABNORMAL HIGH (ref 0.50–1.10)
GFR calc Af Amer: 23 mL/min — ABNORMAL LOW (ref >90)
GFR calc non Af Amer: 20 mL/min — ABNORMAL LOW (ref >90)
Glucose, Bld: 113 mg/dL — ABNORMAL HIGH (ref 70–99)
Total Bilirubin: 0.3 mg/dL (ref 0.3–1.2)
Total Protein: 6.8 g/dL (ref 6.0–8.3)

## 2013-03-31 LAB — CBC WITH DIFFERENTIAL/PLATELET
Basophils Relative: 0 % (ref 0–1)
Eosinophils Absolute: 0.2 10*3/uL (ref 0.0–0.7)
HCT: 38.4 % (ref 36.0–46.0)
Hemoglobin: 12.4 g/dL (ref 12.0–15.0)
Lymphocytes Relative: 11 % — ABNORMAL LOW (ref 12–46)
Lymphs Abs: 1.2 10*3/uL (ref 0.7–4.0)
MCHC: 32.3 g/dL (ref 30.0–36.0)
Monocytes Absolute: 1.1 10*3/uL — ABNORMAL HIGH (ref 0.1–1.0)
Monocytes Relative: 11 % (ref 3–12)
Neutro Abs: 8.1 10*3/uL — ABNORMAL HIGH (ref 1.7–7.7)
Platelets: 235 10*3/uL (ref 150–400)
RBC: 3.85 MIL/uL — ABNORMAL LOW (ref 3.87–5.11)

## 2013-03-31 LAB — POCT I-STAT TROPONIN I: Troponin i, poc: 0.03 ng/mL (ref 0.00–0.08)

## 2013-03-31 LAB — URINALYSIS, ROUTINE W REFLEX MICROSCOPIC
Hgb urine dipstick: NEGATIVE
Ketones, ur: NEGATIVE mg/dL
Leukocytes, UA: NEGATIVE
Protein, ur: NEGATIVE mg/dL
Urobilinogen, UA: 0.2 mg/dL (ref 0.0–1.0)

## 2013-03-31 LAB — CG4 I-STAT (LACTIC ACID): Lactic Acid, Venous: 1.48 mmol/L (ref 0.5–2.2)

## 2013-03-31 NOTE — ED Provider Notes (Signed)
CSN: [id number]     Arrival date & time 03/31/13  2011 History   First MD Initiated Contact with Patient 03/31/13 2107     Chief Complaint  Patient presents with  . Near Syncope   (Consider location/radiation/quality/duration/timing/severity/associated sxs/prior Treatment) The history is provided by the patient.   79 year old female had a near syncopal episode on getting up from the toilet. She had gone to have a bowel movement and had felt fine prior to that. When she got up, she felt very dizzy and was unable to walk. She denies headache or blurred vision. She denies chest pain, heaviness, tightness, pressure. She denies nausea or vomiting. She is feeling somewhat better now although she is laying down in bed. Family has noticed an increase in her oxygen requirement over the last several days - she had been using oxygen only at night, but has required it all day for the last three days. Tonight. SaO2 dropped to 88% in spite of oxygen.  Past Medical History  Diagnosis Date  . Hypertension   . Osteoporosis   . Atrial fib/flutter, transient   . CHF (congestive heart failure)   . Vertigo   . Chronic renal insufficiency   . Hypothyroidism   . Arthritis   . Anxiety   . On home oxygen therapy     uses O2 @ 2l/m nasally bedtime  . Tachy-brady syndrome      treated with amiodarone and permanent DDD pacemaker, 1993   Past Surgical History  Procedure Laterality Date  . Knee arthroscopy      right  . Insert / replace / remove pacemaker    . Total hip arthroplasty      right  . Esophagogastroduodenoscopy N/A 11/26/2012    Procedure: ESOPHAGOGASTRODUODENOSCOPY (EGD);  Surgeon: Charna Elizabeth, MD;  Location: [facility];  Service: Endoscopy;  Laterality: N/A;  . Joint replacement      rt hip  . Shoulder surgery    . Esophagogastroduodenoscopy N/A 03/08/2013    Procedure: ESOPHAGOGASTRODUODENOSCOPY (EGD);  Surgeon: Theda Belfast, MD;  Location: [facility];  Service: Endoscopy;   Laterality: N/A;   Family History  Problem Relation Age of Onset  . Hypertension Mother   . Heart disease Mother     Heart failure (vague history)   History  Substance Use Topics  . Smoking status: Never Smoker   . Smokeless tobacco: Never Used  . Alcohol Use: No   OB History   Grav Para Term Preterm Abortions TAB SAB Ect Mult Living                 Review of Systems  All other systems reviewed and are negative.    Allergies  Quinidine; Tiazac; Warfarin sodium; Percocet; Promethazine hcl; Amiodarone; Procainamide; Septra; Amoxicillin; Ciprofloxacin; and Polocaine  Home Medications   Current Outpatient Rx  Name  Route  Sig  Dispense  Refill  . acetaminophen (TYLENOL ARTHRITIS PAIN) 650 MG CR tablet   Oral   Take 650 mg by mouth every 8 (eight) hours as needed for pain.         Marland Kitchen amLODipine (NORVASC) 5 MG tablet   Oral   Take 5 mg by mouth daily.         Marland Kitchen aspirin 81 MG tablet   Oral   Take 81 mg by mouth daily.         Marland Kitchen atenolol (TENORMIN) 25 MG tablet   Oral   Take 1 tablet (25 mg total) by mouth 2 (  two) times daily.   90 tablet   3   . benzonatate (TESSALON) 100 MG capsule   Oral   Take 1 capsule (100 mg total) by mouth 2 (two) times daily as needed for cough.   30 capsule   0   . buPROPion (WELLBUTRIN XL) 150 MG 24 hr tablet   Oral   Take 300 mg by mouth daily.         . chlorpheniramine-HYDROcodone (TUSSIONEX PENNKINETIC ER) 10-8 MG/5ML LQCR   Oral   Take 5 mLs by mouth every 12 (twelve) hours as needed for cough (cough).   120 mL   0   . Cholecalciferol (VITAMIN D) 2000 UNITS tablet   Oral   Take 2,000 Units by mouth daily.         . Cranberry 450 MG TABS   Oral   Take 1 tablet by mouth daily.         . Cyanocobalamin (VITAMIN B-12 PO)   Oral   Take 1,000 mcg by mouth daily.          . digoxin (LANOXIN) 0.125 MG tablet      Pt to take 1/2 tablet once a day.         . estradiol (ESTRACE) 0.1 MG/GM vaginal cream    Vaginal   Place 2 g vaginally at bedtime.          . furosemide (LASIX) 80 MG tablet   Oral   Take 80 mg by mouth 2 (two) times daily.          . hyoscyamine (LEVSIN/SL) 0.125 MG SL tablet   Sublingual   Place 1 tablet (0.125 mg total) under the tongue every 4 (four) hours as needed for cramping.   15 tablet   0   . lactose free nutrition (BOOST PLUS) LIQD   Oral   Take 237 mLs by mouth 2 (two) times daily between meals.          Marland Kitchen levothyroxine (SYNTHROID, LEVOTHROID) 88 MCG tablet   Oral   Take 1 tablet (88 mcg total) by mouth daily before breakfast.   90 tablet   0   . loratadine (CLARITIN) 10 MG tablet   Oral   Take 10 mg by mouth as needed for allergies.         . magnesium hydroxide (MILK OF MAGNESIA) 400 MG/5ML suspension   Oral   Take 30 mLs by mouth daily as needed for constipation.         . Multiple Vitamins-Minerals (ICAPS PO)   Oral   Take 1 capsule by mouth daily.         . NON FORMULARY   Oral   Take 1 tablet by mouth daily. Ultra Flora IB once a day         . omeprazole (PRILOSEC) 20 MG capsule   Oral   Take 40 mg by mouth every morning.         . potassium chloride (K-DUR,KLOR-CON) 10 MEQ tablet   Oral   Take 1 tablet (10 mEq total) by mouth daily.   90 tablet   3   . spironolactone (ALDACTONE) 25 MG tablet   Oral   Take 25 mg by mouth every morning.         . traMADol (ULTRAM) 50 MG tablet   Oral   Take 50 mg by mouth at bedtime as needed for pain.         Marland Kitchen trimethoprim (TRIMPEX)  100 MG tablet   Oral   Take 100 mg by mouth daily.          BP 146/61  Pulse 67  Temp(Src) 98.1 F (36.7 C) (Oral)  Resp 20  Ht 5\' 5"  (1.651 m)  Wt 140 lb (63.504 kg)  BMI 23.30 kg/m2  SpO2 96% Physical Exam  Nursing note and vitals reviewed.  79 year old female, resting comfortably and in no acute distress. Vital signs are significant for mild hypertension with blood pressure 146/61. Oxygen saturation is 96%, which is  normal. Head is normocephalic and atraumatic. PERRLA, EOMI. Oropharynx is clear. Neck is nontender and supple without adenopathy or JVD. There are no carotid bruits. Back is nontender and there is no CVA tenderness. Lungs are clear without rales, wheezes, or rhonchi. Chest is nontender. Heart has regular rate and rhythm without murmur. Abdomen is soft, flat, nontender without masses or hepatosplenomegaly and peristalsis is normoactive. Extremities have no cyanosis or edema, full range of motion is present. Skin is warm and dry without rash. Neurologic: Mental status is normal, cranial nerves are intact, there are no motor or sensory deficits.  ED Course  Procedures (including critical care time) Labs Review Results for orders placed during the hospital encounter of 03/31/13  CBC WITH DIFFERENTIAL      Result Value Range   WBC 10.7 (*) 4.0 - 10.5 K/uL   RBC 3.85 (*) 3.87 - 5.11 MIL/uL   Hemoglobin 12.4  12.0 - 15.0 g/dL   HCT 16.1  09.6 - 04.5 %   MCV 99.7  78.0 - 100.0 fL   MCH 32.2  26.0 - 34.0 pg   MCHC 32.3  30.0 - 36.0 g/dL   RDW 40.9 (*) 81.1 - 91.4 %   Platelets 235  150 - 400 K/uL   Neutrophils Relative % 76  43 - 77 %   Neutro Abs 8.1 (*) 1.7 - 7.7 K/uL   Lymphocytes Relative 11 (*) 12 - 46 %   Lymphs Abs 1.2  0.7 - 4.0 K/uL   Monocytes Relative 11  3 - 12 %   Monocytes Absolute 1.1 (*) 0.1 - 1.0 K/uL   Eosinophils Relative 2  0 - 5 %   Eosinophils Absolute 0.2  0.0 - 0.7 K/uL   Basophils Relative 0  0 - 1 %   Basophils Absolute 0.0  0.0 - 0.1 K/uL  COMPREHENSIVE METABOLIC PANEL      Result Value Range   Sodium 136  135 - 145 mEq/L   Potassium 3.9  3.5 - 5.1 mEq/L   Chloride 100  96 - 112 mEq/L   CO2 22  19 - 32 mEq/L   Glucose, Bld 113 (*) 70 - 99 mg/dL   BUN 36 (*) 6 - 23 mg/dL   Creatinine, Ser 7.82 (*) 0.50 - 1.10 mg/dL   Calcium 9.2  8.4 - 95.6 mg/dL   Total Protein 6.8  6.0 - 8.3 g/dL   Albumin 3.6  3.5 - 5.2 g/dL   AST 23  0 - 37 U/L   ALT 19  0 - 35  U/L   Alkaline Phosphatase 68  39 - 117 U/L   Total Bilirubin 0.3  0.3 - 1.2 mg/dL   GFR calc non Af Amer 20 (*) >90 mL/min   GFR calc Af Amer 23 (*) >90 mL/min  URINALYSIS, ROUTINE W REFLEX MICROSCOPIC      Result Value Range   Color, Urine YELLOW  YELLOW   APPearance  CLEAR  CLEAR   Specific Gravity, Urine 1.012  1.005 - 1.030   pH 6.0  5.0 - 8.0   Glucose, UA NEGATIVE  NEGATIVE mg/dL   Hgb urine dipstick NEGATIVE  NEGATIVE   Bilirubin Urine NEGATIVE  NEGATIVE   Ketones, ur NEGATIVE  NEGATIVE mg/dL   Protein, ur NEGATIVE  NEGATIVE mg/dL   Urobilinogen, UA 0.2  0.0 - 1.0 mg/dL   Nitrite NEGATIVE  NEGATIVE   Leukocytes, UA NEGATIVE  NEGATIVE  CG4 I-STAT (LACTIC ACID)      Result Value Range   Lactic Acid, Venous 1.48  0.5 - 2.2 mmol/L  POCT I-STAT TROPONIN I      Result Value Range   Troponin i, poc 0.03  0.00 - 0.08 ng/mL   Comment 3            Dg Chest Port 1 View  03/31/2013   CLINICAL DATA:  Shortness of breath. Congestive heart failure. Weakness. Atrial fibrillation.  EXAM: PORTABLE CHEST - 1 VIEW  COMPARISON:  03/21/2013  FINDINGS: Mild cardiomegaly is stable. Chronic pulmonary interstitial prominence appears stable. No evidence of acute or superimposed infiltrate. No evidence of pleural effusion. Dual lead transvenous pacemaker remains in appropriate position. Fixation plate and screws are again seen in the left humerus.  IMPRESSION: Stable exam.  No acute findings.   Electronically Signed   By: Myles Rosenthal M.D.   On: 03/31/2013 21:51    Imaging Review Dg Chest Port 1 View  03/31/2013   CLINICAL DATA:  Shortness of breath. Congestive heart failure. Weakness. Atrial fibrillation.  EXAM: PORTABLE CHEST - 1 VIEW  COMPARISON:  03/21/2013  FINDINGS: Mild cardiomegaly is stable. Chronic pulmonary interstitial prominence appears stable. No evidence of acute or superimposed infiltrate. No evidence of pleural effusion. Dual lead transvenous pacemaker remains in appropriate position.  Fixation plate and screws are again seen in the left humerus.  IMPRESSION: Stable exam.  No acute findings.   Electronically Signed   By: Myles Rosenthal M.D.   On: 03/31/2013 21:51    EKG Interpretation    Date/Time:  Sunday March 31 2013 20:13:45 EST Ventricular Rate:  65 PR Interval:    QRS Duration: 89 QT Interval:  395 QTC Calculation: 411 R Axis:   98 Text Interpretation:  Atrial fibrillation Right axis deviation Repol abnrm, global ischemia, diffuse leads When compared with ECG of 12/04/2012, Atrial fibrillation has replaced Electronic ventricular pacemaker Unable to asssess ST changes, but appear similar inthe few intrinsic beats of prior ECG Confirmed by [facility]  MD, Jhaniya Briski ([id number]) on 03/31/2013 11:19:27 PM            MDM   1. Near syncope   2. Hypoxia   3. Renal insufficiency    Near-syncope of uncertain cause. ECG just a repolarization changes but they appear to have been present on the few non-paced beats are present on prior ECGs.  Workup is unremarkable. Renal insufficiency is stable compared with baseline. She does not show any orthostatic vital sign changes. However, when she was ambulated, oxygen levels dropped to 83%. Beta natruretic protein levels been ordered as has d-dimer. She will need to be admitted for further evaluation. Case is discussed with Dr. Adela Glimpse of [facility] who agrees to admit the patient.  Dione Booze, MD 04/01/13 [phone]

## 2013-03-31 NOTE — ED Notes (Signed)
Per EMS: Patient was in the bathroom having BM. Pt reports she was not straining very hard. Reports when she got up to walk out of the bathroom she had a near syncopal episode. Denies LOC. Patient rhythm is intermittently paced. Neuro exam intact. Ax4 NAD. CBG 148. Pt reports being placed on Digoxin 5 weeks ago. 20 G IV R FA.

## 2013-04-01 ENCOUNTER — Encounter (HOSPITAL_COMMUNITY): Payer: Self-pay | Admitting: Internal Medicine

## 2013-04-01 ENCOUNTER — Inpatient Hospital Stay (HOSPITAL_COMMUNITY): Payer: MEDICARE

## 2013-04-01 DIAGNOSIS — M79609 Pain in unspecified limb: Secondary | ICD-10-CM

## 2013-04-01 DIAGNOSIS — R0902 Hypoxemia: Secondary | ICD-10-CM

## 2013-04-01 DIAGNOSIS — R42 Dizziness and giddiness: Secondary | ICD-10-CM | POA: Diagnosis present

## 2013-04-01 DIAGNOSIS — I5032 Chronic diastolic (congestive) heart failure: Secondary | ICD-10-CM

## 2013-04-01 DIAGNOSIS — I130 Hypertensive heart and chronic kidney disease with heart failure and stage 1 through stage 4 chronic kidney disease, or unspecified chronic kidney disease: Secondary | ICD-10-CM | POA: Diagnosis not present

## 2013-04-01 DIAGNOSIS — J9620 Acute and chronic respiratory failure, unspecified whether with hypoxia or hypercapnia: Secondary | ICD-10-CM | POA: Diagnosis not present

## 2013-04-01 DIAGNOSIS — N179 Acute kidney failure, unspecified: Secondary | ICD-10-CM | POA: Diagnosis not present

## 2013-04-01 DIAGNOSIS — I369 Nonrheumatic tricuspid valve disorder, unspecified: Secondary | ICD-10-CM

## 2013-04-01 DIAGNOSIS — E039 Hypothyroidism, unspecified: Secondary | ICD-10-CM

## 2013-04-01 DIAGNOSIS — I5033 Acute on chronic diastolic (congestive) heart failure: Secondary | ICD-10-CM | POA: Diagnosis not present

## 2013-04-01 DIAGNOSIS — I4891 Unspecified atrial fibrillation: Secondary | ICD-10-CM

## 2013-04-01 DIAGNOSIS — R55 Syncope and collapse: Secondary | ICD-10-CM

## 2013-04-01 DIAGNOSIS — N183 Chronic kidney disease, stage 3 unspecified: Secondary | ICD-10-CM

## 2013-04-01 LAB — D-DIMER, QUANTITATIVE: D-Dimer, Quant: 1.3 ug/mL-FEU — ABNORMAL HIGH (ref 0.00–0.48)

## 2013-04-01 LAB — CBC
HCT: 38 % (ref 36.0–46.0)
Hemoglobin: 12.3 g/dL (ref 12.0–15.0)
MCH: 32.3 pg (ref 26.0–34.0)
MCHC: 32.4 g/dL (ref 30.0–36.0)
MCV: 99.7 fL (ref 78.0–100.0)
Platelets: 246 10*3/uL (ref 150–400)
RDW: 15.9 % — ABNORMAL HIGH (ref 11.5–15.5)

## 2013-04-01 LAB — COMPREHENSIVE METABOLIC PANEL
ALT: 18 U/L (ref 0–35)
AST: 22 U/L (ref 0–37)
Albumin: 3.6 g/dL (ref 3.5–5.2)
Alkaline Phosphatase: 64 U/L (ref 39–117)
GFR calc Af Amer: 24 mL/min — ABNORMAL LOW (ref >90)
Glucose, Bld: 96 mg/dL (ref 70–99)
Potassium: 3.7 mEq/L (ref 3.5–5.1)
Sodium: 139 mEq/L (ref 135–145)
Total Protein: 6.7 g/dL (ref 6.0–8.3)

## 2013-04-01 LAB — TROPONIN I: Troponin I: <0.3 ng/mL (ref <0.30)

## 2013-04-01 LAB — POCT I-STAT TROPONIN I: Troponin i, poc: 0.03 ng/mL (ref 0.00–0.08)

## 2013-04-01 LAB — MAGNESIUM: Magnesium: 2.4 mg/dL (ref 1.5–2.5)

## 2013-04-01 LAB — TSH: TSH: 1.568 u[IU]/mL (ref 0.350–4.500)

## 2013-04-01 LAB — PHOSPHORUS: Phosphorus: 2.8 mg/dL (ref 2.3–4.6)

## 2013-04-01 MED ORDER — LORATADINE 10 MG PO TABS
10.0000 mg | ORAL_TABLET | ORAL | Status: DC | PRN
  Filled 2013-04-01: qty 1

## 2013-04-01 MED ORDER — TECHNETIUM TO 99M ALBUMIN AGGREGATED
6.0000 | Freq: Once | INTRAVENOUS | Status: AC | PRN
  Administered 2013-04-01: 14:00:00 6 via INTRAVENOUS

## 2013-04-01 MED ORDER — BUPROPION HCL ER (XL) 300 MG PO TB24
300.0000 mg | ORAL_TABLET | Freq: Every day | ORAL | Status: DC
  Administered 2013-04-01 – 2013-04-05 (×5): 300 mg via ORAL
  Filled 2013-04-01 (×5): qty 1

## 2013-04-01 MED ORDER — BENZONATATE 100 MG PO CAPS
100.0000 mg | ORAL_CAPSULE | Freq: Two times a day (BID) | ORAL | Status: DC | PRN
  Filled 2013-04-01: qty 1

## 2013-04-01 MED ORDER — ASPIRIN 81 MG PO CHEW
81.0000 mg | CHEWABLE_TABLET | Freq: Every day | ORAL | Status: DC
  Administered 2013-04-01 – 2013-04-03 (×3): 81 mg via ORAL
  Filled 2013-04-01 (×6): qty 1

## 2013-04-01 MED ORDER — POTASSIUM CHLORIDE CRYS ER 10 MEQ PO TBCR
10.0000 meq | EXTENDED_RELEASE_TABLET | Freq: Every day | ORAL | Status: DC
  Administered 2013-04-01 – 2013-04-02 (×2): 10 meq via ORAL
  Filled 2013-04-01 (×3): qty 1

## 2013-04-01 MED ORDER — DIGOXIN 0.0625 MG HALF TABLET
0.0625 mg | ORAL_TABLET | Freq: Every day | ORAL | Status: DC
  Administered 2013-04-02 – 2013-04-05 (×4): 0.0625 mg via ORAL
  Filled 2013-04-01 (×4): qty 1

## 2013-04-01 MED ORDER — FUROSEMIDE 10 MG/ML IJ SOLN
INTRAMUSCULAR | Status: AC
  Administered 2013-04-01: 60 mg via INTRAVENOUS
  Filled 2013-04-01: qty 8

## 2013-04-01 MED ORDER — SODIUM CHLORIDE 0.9 % IJ SOLN
3.0000 mL | Freq: Two times a day (BID) | INTRAMUSCULAR | Status: DC
  Administered 2013-04-01 – 2013-04-02 (×2): 3 mL via INTRAVENOUS

## 2013-04-01 MED ORDER — DOCUSATE SODIUM 100 MG PO CAPS
100.0000 mg | ORAL_CAPSULE | Freq: Two times a day (BID) | ORAL | Status: DC
  Administered 2013-04-01 – 2013-04-05 (×9): 100 mg via ORAL
  Filled 2013-04-01 (×10): qty 1

## 2013-04-01 MED ORDER — BUDESONIDE 0.25 MG/2ML IN SUSP
0.2500 mg | Freq: Two times a day (BID) | RESPIRATORY_TRACT | Status: DC
  Administered 2013-04-01 – 2013-04-05 (×7): 0.25 mg via RESPIRATORY_TRACT
  Filled 2013-04-01 (×11): qty 2

## 2013-04-01 MED ORDER — TRAMADOL HCL 50 MG PO TABS: 50.0000 mg | ORAL_TABLET | Freq: Four times a day (QID) | ORAL | Status: DC | PRN

## 2013-04-01 MED ORDER — ACETAMINOPHEN 325 MG PO TABS: 650.0000 mg | ORAL_TABLET | Freq: Four times a day (QID) | ORAL | Status: DC | PRN

## 2013-04-01 MED ORDER — PANTOPRAZOLE SODIUM 40 MG PO TBEC
40.0000 mg | DELAYED_RELEASE_TABLET | Freq: Every day | ORAL | Status: DC
  Administered 2013-04-01 – 2013-04-05 (×5): 40 mg via ORAL
  Filled 2013-04-01 (×5): qty 1

## 2013-04-01 MED ORDER — ENOXAPARIN SODIUM 30 MG/0.3ML ~~LOC~~ SOLN
30.0000 mg | SUBCUTANEOUS | Status: DC
  Administered 2013-04-01 – 2013-04-05 (×5): 30 mg via SUBCUTANEOUS
  Filled 2013-04-01 (×5): qty 0.3

## 2013-04-01 MED ORDER — ATENOLOL 25 MG PO TABS
25.0000 mg | ORAL_TABLET | Freq: Two times a day (BID) | ORAL | Status: DC
  Administered 2013-04-01 – 2013-04-05 (×9): 25 mg via ORAL
  Filled 2013-04-01 (×10): qty 1

## 2013-04-01 MED ORDER — SODIUM CHLORIDE 0.9 % IJ SOLN
3.0000 mL | Freq: Two times a day (BID) | INTRAMUSCULAR | Status: DC
  Administered 2013-04-02: 10:00:00 3 mL via INTRAVENOUS

## 2013-04-01 MED ORDER — HYDROCOD POLST-CHLORPHEN POLST 10-8 MG/5ML PO LQCR: 5.0000 mL | Freq: Two times a day (BID) | ORAL | Status: DC | PRN

## 2013-04-01 MED ORDER — FUROSEMIDE 10 MG/ML IJ SOLN
60.0000 mg | Freq: Three times a day (TID) | INTRAMUSCULAR | Status: DC
  Administered 2013-04-01 – 2013-04-02 (×3): 60 mg via INTRAVENOUS
  Filled 2013-04-01 (×5): qty 6

## 2013-04-01 MED ORDER — ONDANSETRON HCL 4 MG/2ML IJ SOLN: 4.0000 mg | Freq: Four times a day (QID) | INTRAMUSCULAR | Status: DC | PRN

## 2013-04-01 MED ORDER — TRIMETHOPRIM 100 MG PO TABS
100.0000 mg | ORAL_TABLET | Freq: Every day | ORAL | Status: DC
  Administered 2013-04-01 – 2013-04-02 (×2): 100 mg via ORAL
  Filled 2013-04-01 (×3): qty 1

## 2013-04-01 MED ORDER — ONDANSETRON HCL 4 MG PO TABS: 4.0000 mg | ORAL_TABLET | Freq: Four times a day (QID) | ORAL | Status: DC | PRN

## 2013-04-01 MED ORDER — TECHNETIUM TC 99M DIETHYLENETRIAME-PENTAACETIC ACID: 40.0000 | Freq: Once | INTRAVENOUS | Status: AC | PRN

## 2013-04-01 MED ORDER — HYDROCODONE-ACETAMINOPHEN 5-325 MG PO TABS: 1.0000 | ORAL_TABLET | ORAL | Status: DC | PRN

## 2013-04-01 MED ORDER — SODIUM CHLORIDE 0.9 % IJ SOLN: 3.0000 mL | INTRAMUSCULAR | Status: DC | PRN

## 2013-04-01 MED ORDER — SPIRONOLACTONE 25 MG PO TABS
25.0000 mg | ORAL_TABLET | Freq: Every morning | ORAL | Status: DC
  Administered 2013-04-01 – 2013-04-02 (×2): 25 mg via ORAL
  Filled 2013-04-01 (×3): qty 1

## 2013-04-01 MED ORDER — SODIUM CHLORIDE 0.9 % IV SOLN: 250.0000 mL | INTRAVENOUS | Status: DC | PRN

## 2013-04-01 MED ORDER — AMLODIPINE BESYLATE 5 MG PO TABS
5.0000 mg | ORAL_TABLET | Freq: Every day | ORAL | Status: DC
  Administered 2013-04-01 – 2013-04-05 (×5): 5 mg via ORAL
  Filled 2013-04-01 (×5): qty 1

## 2013-04-01 MED ORDER — LEVOTHYROXINE SODIUM 88 MCG PO TABS
88.0000 ug | ORAL_TABLET | Freq: Every day | ORAL | Status: DC
  Administered 2013-04-01 – 2013-04-05 (×5): 88 ug via ORAL
  Filled 2013-04-01 (×7): qty 1

## 2013-04-01 MED ORDER — FUROSEMIDE 10 MG/ML IJ SOLN
INTRAMUSCULAR | Status: AC
  Filled 2013-04-01: qty 8

## 2013-04-01 MED ORDER — FUROSEMIDE 80 MG PO TABS
80.0000 mg | ORAL_TABLET | Freq: Two times a day (BID) | ORAL | Status: DC
  Filled 2013-04-01 (×3): qty 1

## 2013-04-01 MED ORDER — ACETAMINOPHEN 650 MG RE SUPP: 650.0000 mg | Freq: Four times a day (QID) | RECTAL | Status: DC | PRN

## 2013-04-01 NOTE — Progress Notes (Signed)
Admitted pt to rm 4E04 from ED via stretcher, pt alert and oriented, denies pain at this time, oriented to room, call bell placed within reach, admission assessment done, orders carried out, pt V-paced on heart monitor. Will continue to monitor.

## 2013-04-01 NOTE — Progress Notes (Signed)
Patient seen and examined. Admitted after midnight secondary to worsening SOB, non-productive cough and lightheadedness. Physical exam with LE swelling, mild crackles and decrease BS at bases. Patient on chronic oxygen at home 2L. On admission D-dimer elevated. Please referred to Dr. Celene Kras H&P for further info/details on admission.  Plan: -given mild crackles on exam and also elevated BNP will use IV lasix -follow V/Q scan -continue oxygen supplementation -start pulmicort -daily weight and strict I' and O's -follow on telemetry for now and cycle troponin.   [name] [phone]

## 2013-04-01 NOTE — Progress Notes (Signed)
VASCULAR LAB PRELIMINARY  PRELIMINARY  PRELIMINARY  PRELIMINARY  Bilateral lower extremity venous duplex  completed.    Preliminary report:  Bilateral:  No evidence of DVT, superficial thrombosis, or Baker's Cyst.    Malakai Schoenherr, RVT 04/01/2013, 10:29 AM

## 2013-04-01 NOTE — H&P (Signed)
PCP: Carollee Herter, MD  [facility]: [facility] [place]   Chief Complaint:  lightheadedness  HPI: [name] is a 79 y.o. female   has a past medical history of Hypertension; Osteoporosis; Atrial fib/flutter, transient; CHF (congestive heart failure); Vertigo; Chronic renal insufficiency; Hypothyroidism; Arthritis; Anxiety; On home oxygen therapy; and Tachy-brady syndrome.   Presented with  Patient have been coughing for the past 3 weeks but work up has been negative so far. CXR unremarkable. Tonight patient became light headed after having a BM. She was on oxygen at night but since having the cough she required o2 around the clock and was found t be hypoxic on 2 L today. Patient now requiring 3 L. Denies any chest pain.  CXR per my review showing persistent scaring on the left. Patient is afebrile, slightly elevated WBC.  Hospitatist called for admission    Review of Systems:    Pertinent positives include:  non-productive cough, dizziness,  Constitutional:  No weight loss, night sweats, Fevers, chills, fatigue, weight loss  HEENT:  No headaches, Difficulty swallowing,Tooth/dental problems,Sore throat,  No sneezing, itching, ear ache, nasal congestion, post nasal drip,  Cardio-vascular:  No chest pain, Orthopnea, PND, anasarca,  palpitations.no Bilateral lower extremity swelling  GI:  No heartburn, indigestion, abdominal pain, nausea, vomiting, diarrhea, change in bowel habits, loss of appetite, melena, blood in stool, hematemesis Resp:  no shortness of breath at rest. No dyspnea on exertion, No excess mucus, no productive cough,   No coughing up of blood.No change in color of mucus.No wheezing. Skin:  no rash or lesions. No jaundice GU:  no dysuria, change in color of urine, no urgency or frequency. No straining to urinate.  No flank pain.  Musculoskeletal:  No joint pain or no joint swelling. No decreased range of motion. No back pain.   Psych:  No change in mood or affect. No depression or anxiety. No memory loss.  Neuro: no localizing neurological complaints, no tingling, no weakness, no double vision, no gait abnormality, no slurred speech, no confusion  Otherwise ROS are negative except for above, 10 systems were reviewed  Past Medical History: Past Medical History  Diagnosis Date  . Hypertension   . Osteoporosis   . Atrial fib/flutter, transient   . CHF (congestive heart failure)   . Vertigo   . Chronic renal insufficiency   . Hypothyroidism   . Arthritis   . Anxiety   . On home oxygen therapy     uses O2 @ 2l/m nasally bedtime  . Tachy-brady syndrome      treated with amiodarone and permanent DDD pacemaker, 1993   Past Surgical History  Procedure Laterality Date  . Knee arthroscopy      right  . Insert / replace / remove pacemaker    . Total hip arthroplasty      right  . Esophagogastroduodenoscopy N/A 11/26/2012    Procedure: ESOPHAGOGASTRODUODENOSCOPY (EGD);  Surgeon: Charna Elizabeth, MD;  Location: [facility];  Service: Endoscopy;  Laterality: N/A;  . Joint replacement      rt hip  . Shoulder surgery    . Esophagogastroduodenoscopy N/A 03/08/2013    Procedure: ESOPHAGOGASTRODUODENOSCOPY (EGD);  Surgeon: Theda Belfast, MD;  Location: [facility];  Service: Endoscopy;  Laterality: N/A;  . Pacemaker insertion       Medications: Prior to Admission medications   Medication Sig Start Date End Date Taking? Authorizing Provider  acetaminophen (TYLENOL ARTHRITIS PAIN) 650 MG CR tablet Take 650 mg  by mouth every 8 (eight) hours as needed for pain.   Yes Historical Provider, MD  amLODipine (NORVASC) 5 MG tablet Take 5 mg by mouth daily.   Yes Historical Provider, MD  aspirin 81 MG tablet Take 81 mg by mouth daily.   Yes Historical Provider, MD  atenolol (TENORMIN) 25 MG tablet Take 1 tablet (25 mg total) by mouth 2 (two) times daily. 03/20/13  Yes Lyn Records III, MD  benzonatate (TESSALON) 100 MG  capsule Take 1 capsule (100 mg total) by mouth 2 (two) times daily as needed for cough. 03/22/13  Yes Ronnald Nian, MD  buPROPion (WELLBUTRIN XL) 150 MG 24 hr tablet Take 300 mg by mouth daily. 01/28/13  Yes Historical Provider, MD  chlorpheniramine-HYDROcodone (TUSSIONEX PENNKINETIC ER) 10-8 MG/5ML LQCR Take 5 mLs by mouth every 12 (twelve) hours as needed for cough (cough). 03/29/13  Yes Rosalio Macadamia, NP  Cholecalciferol (VITAMIN D) 2000 UNITS tablet Take 2,000 Units by mouth daily.   Yes Historical Provider, MD  Cranberry 450 MG TABS Take 1 tablet by mouth daily.   Yes Historical Provider, MD  Cyanocobalamin (VITAMIN B-12 PO) Take 1,000 mcg by mouth daily.    Yes Historical Provider, MD  digoxin (LANOXIN) 0.125 MG tablet Pt to take 1/2 tablet once a day. 03/14/13  Yes Vesta Mixer, MD  estradiol (ESTRACE) 0.1 MG/GM vaginal cream Place 2 g vaginally at bedtime.    Yes Historical Provider, MD  furosemide (LASIX) 80 MG tablet Take 80 mg by mouth 2 (two) times daily.    Yes Historical Provider, MD  hyoscyamine (LEVSIN/SL) 0.125 MG SL tablet Place 1 tablet (0.125 mg total) under the tongue every 4 (four) hours as needed for cramping. 10/26/12  Yes Ronnald Nian, MD  lactose free nutrition (BOOST PLUS) LIQD Take 237 mLs by mouth 2 (two) times daily between meals.    Yes Historical Provider, MD  levothyroxine (SYNTHROID, LEVOTHROID) 88 MCG tablet Take 1 tablet (88 mcg total) by mouth daily before breakfast. 03/20/13  Yes Ronnald Nian, MD  loratadine (CLARITIN) 10 MG tablet Take 10 mg by mouth as needed for allergies.   Yes Historical Provider, MD  magnesium hydroxide (MILK OF MAGNESIA) 400 MG/5ML suspension Take 30 mLs by mouth daily as needed for constipation.   Yes Historical Provider, MD  Multiple Vitamins-Minerals (ICAPS PO) Take 1 capsule by mouth daily.   Yes Historical Provider, MD  NON FORMULARY Take 1 tablet by mouth daily. Ultra Flora IB once a day   Yes Historical Provider, MD   omeprazole (PRILOSEC) 20 MG capsule Take 40 mg by mouth every morning.   Yes Historical Provider, MD  potassium chloride (K-DUR,KLOR-CON) 10 MEQ tablet Take 1 tablet (10 mEq total) by mouth daily. 03/20/13  Yes Lyn Records III, MD  spironolactone (ALDACTONE) 25 MG tablet Take 25 mg by mouth every morning.   Yes Historical Provider, MD  traMADol (ULTRAM) 50 MG tablet Take 50 mg by mouth at bedtime as needed for pain.   Yes Historical Provider, MD  trimethoprim (TRIMPEX) 100 MG tablet Take 100 mg by mouth daily.   Yes Historical Provider, MD    Allergies:   Allergies  Allergen Reactions  . Quinidine Other (See Comments)    Skin peels  . Tiazac [Diltiazem Hcl] Hives    "don't remember how bad the reaction was" (04/24/2012)  . Warfarin Sodium Other (See Comments)    GI bleed  . Percocet [Oxycodone-Acetaminophen] Other (See Comments)  confusion  . Promethazine Hcl Other (See Comments)    confusion  . Amiodarone   . Procainamide Other (See Comments)    Lupus reaction  . Septra [Sulfamethoxazole W/Trimethoprim (Co-Trimoxazole)] Other (See Comments)    Dizziness and tremor "don't remember how bad the reaction was" (04/24/2012)  . Amoxicillin Rash  . Ciprofloxacin Rash  . Polocaine [Mepivacaine Hcl] Palpitations    Social History:  Ambulatory with walker Lives at   Home with family   reports that she has never smoked. She has never used smokeless tobacco. She reports that she does not drink alcohol or use illicit drugs.   Family History: family history includes Heart disease in her mother; Hypertension in her mother.    Physical Exam: Patient Vitals for the past 24 hrs:  BP Temp Temp src Pulse Resp SpO2 Height Weight  04/01/13 0130 139/58 mmHg - - 66 23 91 % - -  04/01/13 0100 155/91 mmHg - - 64 23 94 % - -  04/01/13 0030 153/50 mmHg - - 63 24 93 % - -  04/01/13 0000 152/43 mmHg - - 65 23 92 % - -  03/31/13 2315 147/57 mmHg - - 70 35 90 % - -  03/31/13 2230 146/61 mmHg  - - 67 - - - -  03/31/13 2218 134/50 mmHg - - 72 - - - -  03/31/13 2215 138/39 mmHg - - 69 20 96 % - -  03/31/13 2214 138/39 mmHg - - 63 - - - -  03/31/13 2200 148/43 mmHg - - 66 32 89 % - -  03/31/13 2145 151/31 mmHg - - 56 25 89 % - -  03/31/13 2130 162/72 mmHg - - 68 29 94 % - -  03/31/13 2115 140/63 mmHg - - 64 26 91 % - -  03/31/13 2100 150/53 mmHg - - 66 21 91 % - -  03/31/13 2045 143/57 mmHg - - 69 23 94 % - -  03/31/13 2030 144/56 mmHg - - 62 23 92 % - -  03/31/13 2018 - - - - - 90 % 5\' 5"  (1.651 m) 63.504 kg (140 lb)  03/31/13 2017 153/65 mmHg 98.1 F (36.7 C) Oral - 26 87 % - -  03/31/13 2015 153/65 mmHg - - 74 25 91 % - -    1. General:  in No Acute distress 2. Psychological: Alert and  Oriented 3. Head/ENT:   Moist   Mucous Membranes                          Head Non traumatic, neck supple                          Normal   Dentition 4. SKIN: normal   Skin turgor,  Skin clean Dry and intact no rash 5. Heart: irregular rate and rhythm no Murmur, Rub or gallop 6. Lungs: no wheezes crackles heard on the left  7. Abdomen: Soft, non-tender, Non distended 8. Lower extremities: no clubbing, cyanosis, edema Left >right 9. Neurologically Grossly intact, moving all 4 extremities equally 10. MSK: Normal range of motion  body mass index is 23.3 kg/(m^2).   Labs on Admission:   Recent Labs  03/29/13 1547 03/31/13 2135  NA 135 136  K 4.4 3.9  CL 102 100  CO2 24 22  GLUCOSE 92 113*  BUN 34* 36*  CREATININE 2.1* 2.11*  CALCIUM 9.4 9.2  Recent Labs  03/31/13 2135  AST 23  ALT 19  ALKPHOS 68  BILITOT 0.3  PROT 6.8  ALBUMIN 3.6   No results found for this basename: LIPASE, AMYLASE,  in the last 72 hours  Recent Labs  03/29/13 1547 03/31/13 2135  WBC 10.9* 10.7*  NEUTROABS 8.1* 8.1*  HGB 13.1 12.4  HCT 39.4 38.4  MCV 95.4 99.7  PLT 290.0 235   No results found for this basename: CKTOTAL, CKMB, CKMBINDEX, TROPONINI,  in the last 72 hours No results  found for this basename: TSH, T4TOTAL, FREET3, T3FREE, THYROIDAB,  in the last 72 hours No results found for this basename: VITAMINB12, FOLATE, FERRITIN, TIBC, IRON, RETICCTPCT,  in the last 72 hours Lab Results  Component Value Date   HGBA1C 5.6 04/13/2012    Estimated Creatinine Clearance: 15.9 ml/min (by C-G formula based on Cr of 2.11).  Lab Results  Component Value Date   DDIMER 1.30* 04/01/2013     Other results:  I have pearsonaly reviewed this: ECG REPORT  Rate: 65  Rhythm: A.fib ST&T Change: no change from prior  UA no evidence of UTI BNP 3268.0 (H)  Cultures:    Component Value Date/Time   SDES URINE, RANDOM 04/24/2012 1330   SPECREQUEST NONE 04/24/2012 1330   CULT Multiple bacterial morphotypes present, none predominant. Suggest appropriate recollection if clinically indicated. 04/24/2012 1330   REPTSTATUS 04/25/2012 FINAL 04/24/2012 1330       Radiological Exams on Admission: Dg Chest Port 1 View  03/31/2013   CLINICAL DATA:  Shortness of breath. Congestive heart failure. Weakness. Atrial fibrillation.  EXAM: PORTABLE CHEST - 1 VIEW  COMPARISON:  03/21/2013  FINDINGS: Mild cardiomegaly is stable. Chronic pulmonary interstitial prominence appears stable. No evidence of acute or superimposed infiltrate. No evidence of pleural effusion. Dual lead transvenous pacemaker remains in appropriate position. Fixation plate and screws are again seen in the left humerus.  IMPRESSION: Stable exam.  No acute findings.   Electronically Signed   By: Myles Rosenthal M.D.   On: 03/31/2013 21:51    Chart has been reviewed  Assessment/Plan 79 yo w hx of CHF, CKD here with worsening hypoxia and light headedness   Present on Admission:  . Lightheadedness - likely multifactorial possibly result of hypoxia. We'll cycle cardiac markers monitor on  telemetry  . Essential hypertension - continue home  . Chronic kidney disease, stage III (moderate) - stable   Hypoxia - new his chest x-ray  not consistent with worsening findings. Given positive d-dimer slightly swollen left leg will obtain VQ scan  given a abnormal chest x-ray we'll obtain CT of the chest to evaluate this further  . Atrial fibrillation - currently rate controlled continue home medications  . Hypothyroid - C. all meds check TSH  . Chronic diastolic heart failure - currently appears to be euvolemic continue Lasix   Prophylaxis:  Lovenox, Protonix  CODE STATUS: DNR/DNI as per patient's family wishes  Other plan as per orders.  I have spent a total of 55 min on this admission  Asalee Barrette 04/01/2013, 2:27 AM

## 2013-04-01 NOTE — Progress Notes (Signed)
  Echocardiogram 2D Echocardiogram has been performed.  Cathie Beams 04/01/2013, 9:56 AM

## 2013-04-01 NOTE — ED Notes (Addendum)
Pt ambulated from room to bathroom. O2 stayed between 83 and 88%. Pt was weak and out of breath once at the bathroom. Pt stated that she was feeling dizzy. Pt was wheeled back to room.

## 2013-04-01 NOTE — Progress Notes (Signed)
Utilization Review Completed.[name] T11/24/2014  

## 2013-04-02 DIAGNOSIS — I10 Essential (primary) hypertension: Secondary | ICD-10-CM

## 2013-04-02 LAB — BASIC METABOLIC PANEL
BUN: 26 mg/dL — ABNORMAL HIGH (ref 6–23)
Chloride: 103 mEq/L (ref 96–112)
Creatinine, Ser: 2.01 mg/dL — ABNORMAL HIGH (ref 0.50–1.10)
GFR calc Af Amer: 24 mL/min — ABNORMAL LOW (ref >90)
GFR calc non Af Amer: 21 mL/min — ABNORMAL LOW (ref >90)
Potassium: 3.6 mEq/L (ref 3.5–5.1)

## 2013-04-02 MED ORDER — FUROSEMIDE 10 MG/ML IJ SOLN
60.0000 mg | Freq: Two times a day (BID) | INTRAMUSCULAR | Status: DC
  Administered 2013-04-02 – 2013-04-03 (×2): 60 mg via INTRAVENOUS
  Filled 2013-04-02 (×2): qty 6

## 2013-04-02 NOTE — Progress Notes (Signed)
[facility] PROGRESS NOTE  Filed Weights   03/31/13 2018 04/01/13 0524 04/02/13 0459  Weight: 74.504 kg (150 lb) 67.268 kg (148 lb 4.8 oz) 65 kg (143 lb 4.8 oz)        Intake/Output Summary (Last 24 hours) at 04/02/13 1713 Last data filed at 04/02/13 1549  Gross per 24 hour  Intake    803 ml  Output   1951 ml  Net  -1148 ml     Assessment/Plan: 1-Acute on chronic resp failure due to diastolic heart failure exacerbation: -improving -Patient is 5 pounds lighter and breathing a lot better. -continue IVF lasix -daily bmet, strict I's and O's and daily weight -will start titration of O2 to RA  2-Lightheadedness: resolved and with neg orthostatic VS -most likely due decrease perfusion with CHF exacerbation and hypoxia.  3-Essential hypertension: stable. Continue current regimen  4-Chronic kidney disease, stage III (moderate): essentially at baseline to slightly improved with diuresis. -will monitor renal function  5-Atrial fibrillation: rate controlled. Continue home medication regimen.  6-Hypothyroid:TSH WNL. Continue synthroid.  7-GERD: continue PPI  DVT: lovenox    Code Status: DNR Family Communication: daughters at bedside  Disposition Plan: home when medically stable   Consultants:  None   Procedures: ECHO: (grade 2 diastolic, no wall motion abnormalities and preserved EF at 60-65%. Moderate regurgitation of tricuspid valve)  Antibiotics:  None   HPI/Subjective: Afebrile, breathing better and easier. Denies CP  Objective: Filed Vitals:   04/02/13 0208 04/02/13 0459 04/02/13 0947 04/02/13 1300  BP: 125/41 126/53 130/55 140/40  Pulse: 66 66 65 66  Temp: 98.1 F (36.7 C) 97.6 F (36.4 C)  97.4 F (36.3 C)  TempSrc: Oral Oral Oral Oral  Resp: 18 18 18 18   Height:      Weight:  65 kg (143 lb 4.8 oz)    SpO2: 94% 95% 96% 96%     Exam:  General: Alert, awake, oriented x3, in no acute distress. Breathing better and easier, no fever HEENT:  No bruits, no goiter.  Heart: Regular rate, no rubs or gallops. No JVD and trace edema bilaterally Lungs: Good air movement, bilateral air movement.  Abdomen: Soft, nontender, nondistended, positive bowel sounds.  Neuro: Grossly intact, nonfocal.   Data Reviewed: Basic Metabolic Panel:  Recent Labs Lab 03/29/13 1547 03/31/13 2135 04/01/13 0757 04/02/13 0600  NA 135 136 139 142  K 4.4 3.9 3.7 3.6  CL 102 100 103 103  CO2 24 22 23 25   GLUCOSE 92 113* 96 70  BUN 34* 36* 33* 26*  CREATININE 2.1* 2.11* 2.04* 2.01*  CALCIUM 9.4 9.2 9.1 9.0  MG  --   --  2.4  --   PHOS  --   --  2.8  --    Liver Function Tests:  Recent Labs Lab 03/31/13 2135 04/01/13 0757  AST 23 22  ALT 19 18  ALKPHOS 68 64  BILITOT 0.3 0.5  PROT 6.8 6.7  ALBUMIN 3.6 3.6   CBC:  Recent Labs Lab 03/29/13 1547 03/31/13 2135 04/01/13 0757  WBC 10.9* 10.7* 11.7*  NEUTROABS 8.1* 8.1*  --   HGB 13.1 12.4 12.3  HCT 39.4 38.4 38.0  MCV 95.4 99.7 99.7  PLT 290.0 235 246   Cardiac Enzymes:  Recent Labs Lab 04/01/13 0757 04/01/13 1115 04/01/13 1905  TROPONINI <0.30 <0.30 <0.30   BNP (last 3 results)  Recent Labs  12/04/12 1845 03/29/13 1547 03/31/13 2125  PROBNP 1627.0* 355.0* 3268.0*    Studies:  Ct Chest Wo Contrast  04/01/2013   CLINICAL DATA:  Shortness of breath  EXAM: CT CHEST WITHOUT CONTRAST  TECHNIQUE: Multidetector CT imaging of the chest was performed following the standard protocol without IV contrast.  COMPARISON:  04/01/2013, 03/31/2013  FINDINGS: Small bilateral pleural effusions are identified right greater than left. Mild interstitial changes are seen in both lungs without focal confluent infiltrate. No definitive pulmonary nodule or mass lesion is seen. Linear atelectatic changes are noted in the left lower lobe as well. A pacing device is noted on the left. No significant hilar or mediastinal adenopathy is noted. Coronary calcifications are seen. Visualized upper abdomen  demonstrates findings of prior cholecystectomy is well as ingested tablets. No acute bony abnormality is seen. Diffuse degenerative change of the thoracic spine is noted.  IMPRESSION: Bilateral pleural effusions right greater than left.  Diffuse stable interstitial changes are noted bilaterally. Mild left basilar atelectasis is seen.   Electronically Signed   By: Alcide Clever M.D.   On: 04/01/2013 17:29   Nm Pulmonary Perf And Vent  04/01/2013   CLINICAL DATA:  79 year old with shortness of breath.  EXAM: NUCLEAR MEDICINE VENTILATION - PERFUSION LUNG SCAN  TECHNIQUE: Ventilation images were obtained in multiple projections using inhaled aerosol technetium 99 M DTPA. Perfusion images were obtained in multiple projections after intravenous injection of Tc-26m MAA.  COMPARISON:  Chest radiograph 03/31/2013  RADIOPHARMACEUTICALS:  40 mCi Tc-11m DTPA aerosol and 6 mCi Tc-97m MAA  FINDINGS: Ventilation: There is clumping of the radiopharmaceutical in the hilar regions and limited evaluation of the ventilation.  Perfusion: There is a focal area of photopenia in the left mid chest consistent with the pacemaker. Otherwise, there is no significant segmental or wedge-shaped perfusion abnormality.  IMPRESSION: Low probability for pulmonary embolism.   Electronically Signed   By: Richarda Overlie M.D.   On: 04/01/2013 16:56   Dg Chest Port 1 View  03/31/2013   CLINICAL DATA:  Shortness of breath. Congestive heart failure. Weakness. Atrial fibrillation.  EXAM: PORTABLE CHEST - 1 VIEW  COMPARISON:  03/21/2013  FINDINGS: Mild cardiomegaly is stable. Chronic pulmonary interstitial prominence appears stable. No evidence of acute or superimposed infiltrate. No evidence of pleural effusion. Dual lead transvenous pacemaker remains in appropriate position. Fixation plate and screws are again seen in the left humerus.  IMPRESSION: Stable exam.  No acute findings.   Electronically Signed   By: Myles Rosenthal M.D.   On: 03/31/2013 21:51     Scheduled Meds: . amLODipine  5 mg Oral Daily  . aspirin  81 mg Oral Daily  . atenolol  25 mg Oral BID  . budesonide (PULMICORT) nebulizer solution  0.25 mg Nebulization BID  . buPROPion  300 mg Oral Daily  . digoxin  0.0625 mg Oral Daily  . docusate sodium  100 mg Oral BID  . enoxaparin (LOVENOX) injection  30 mg Subcutaneous Q24H  . furosemide  60 mg Intravenous Q12H  . levothyroxine  88 mcg Oral QAC breakfast  . pantoprazole  40 mg Oral Daily  . potassium chloride  10 mEq Oral Daily  . sodium chloride  3 mL Intravenous Q12H  . sodium chloride  3 mL Intravenous Q12H  . spironolactone  25 mg Oral q morning - 10a  . trimethoprim  100 mg Oral Daily   Continuous Infusions:    Kervin Bones  [facility] Pager [phone]. If 8PM-8AM, please contact night-coverage at [email or web address], password [facility] 04/02/2013, 5:13 PM  LOS: 2 days

## 2013-04-02 NOTE — Progress Notes (Signed)
The patient complained multiple times about her bowels during the night.  She was able to have a medium-sized loose bowel movement during the night.  Otherwise, she did not have any complaints.

## 2013-04-03 DIAGNOSIS — I5031 Acute diastolic (congestive) heart failure: Secondary | ICD-10-CM | POA: Diagnosis present

## 2013-04-03 LAB — BASIC METABOLIC PANEL
CO2: 25 mEq/L (ref 19–32)
Calcium: 9.1 mg/dL (ref 8.4–10.5)
Creatinine, Ser: 2.07 mg/dL — ABNORMAL HIGH (ref 0.50–1.10)
GFR calc Af Amer: 23 mL/min — ABNORMAL LOW (ref >90)
GFR calc non Af Amer: 20 mL/min — ABNORMAL LOW (ref >90)
Sodium: 141 mEq/L (ref 135–145)

## 2013-04-03 LAB — SODIUM, URINE, RANDOM: Sodium, Ur: 77 mEq/L

## 2013-04-03 LAB — CREATININE, URINE, RANDOM: Creatinine, Urine: 35.46 mg/dL

## 2013-04-03 LAB — VITAMIN B12: Vitamin B-12: 595 pg/mL (ref 211–911)

## 2013-04-03 LAB — PRO B NATRIURETIC PEPTIDE: Pro B Natriuretic peptide (BNP): 2567 pg/mL — ABNORMAL HIGH (ref 0–450)

## 2013-04-03 MED ORDER — FUROSEMIDE 10 MG/ML IJ SOLN
80.0000 mg | Freq: Three times a day (TID) | INTRAMUSCULAR | Status: DC
  Administered 2013-04-03 (×3): 80 mg via INTRAVENOUS
  Filled 2013-04-03 (×4): qty 8

## 2013-04-03 NOTE — Progress Notes (Signed)
Physical Therapy Evaluation Patient Details Name: [name] MRN: [id number] DOB: Aug 10, 1919 Today's Date: 04/03/2013 Time: 4098-1191 PT Time Calculation (min): 26 min  PT Assessment / Plan / Recommendation History of Present Illness  Pt admitted with near syncopal episode after using restroom at home. Pt with acute on chronic respiratory failure 2* HF, PMH:  vertigo, HTN, arthritis, afib, CKD.  Clinical Impression  Pt admitted with above. Pt currently with functional limitations due to the deficits listed below (see PT Problem List).  Pt will be able to go home with HHPT f/u for vestibular rehab as well as 24 hour assist at home.   Pt will benefit from skilled PT to increase their independence and safety with mobility to allow discharge to the venue listed below.     PT Assessment  Patient needs continued PT services    Follow Up Recommendations  Home health PT;Supervision/Assistance - 24 hour (with vestibular rehab)                Equipment Recommendations  None recommended by PT         Frequency Min 3X/week    Precautions / Restrictions Precautions Precautions: Fall Restrictions Weight Bearing Restrictions: No   Pertinent Vitals/Pain O2 on RA down to 84%.  O2 on 2LO2 >90%, No pain      Mobility  Bed Mobility Bed Mobility: Supine to Sit Supine to Sit: 6: Modified independent (Device/Increase time) Details for Bed Mobility Assistance: moves slowly, hx of vertigo.  Tested pt for BPPV with negative tests.  Pt with positive right head thrust.  Initiated x1 exercises.  Pt can return demonstration and exercises incr dizziness.  Gave handouts with progression of exercise.   Transfers Transfers: Sit to Stand;Stand to Sit Sit to Stand: 5: Supervision Stand to Sit: 5: Supervision Details for Transfer Assistance: cues for hand placement. Ambulation/Gait Ambulation/Gait Assistance: 4: Min assist Ambulation Distance (Feet): 150 Feet Assistive device: Rolling  walker Ambulation/Gait Assistance Details: Pt needed assist for steadying as she lost her balance a few times.  Pt needs cues for safety with RW as well.   Gait Pattern: Step-through pattern;Decreased stride length;Shuffle Gait velocity: decreased Stairs: No Wheelchair Mobility Wheelchair Mobility: No         PT Diagnosis: Generalized weakness  PT Problem List: Decreased activity tolerance;Decreased balance;Decreased mobility;Decreased knowledge of use of DME;Decreased safety awareness;Decreased knowledge of precautions PT Treatment Interventions: DME instruction;Gait training;Functional mobility training;Therapeutic activities;Therapeutic exercise;Balance training;Patient/family education     PT Goals(Current goals can be found in the care plan section) Acute Rehab PT Goals Patient Stated Goal: home today with caregiver assist and daughter PT Goal Formulation: With patient Time For Goal Achievement: 04/10/13 Potential to Achieve Goals: Good  Visit Information  Last PT Received On: 04/03/13 Assistance Needed: +1 PT/OT Co-Evaluation/Treatment: Yes History of Present Illness: Pt admitted with near syncopal episode after using restroom at home. Pt with acute on chronic respiratory failure 2* HF, PMH:  vertigo, HTN, arthritis, afib, CKD.       Prior Functioning  Home Living Family/patient expects to be discharged to:: Private residence Living Arrangements: Alone Available Help at Discharge: Personal care attendant;Available 24 hours/day Type of Home: House Home Access: Stairs to enter [employer] of Steps: 5 Entrance Stairs-Rails: Right Home Layout: One level Home Equipment: Walker - 2 wheels;Shower seat;Hand held shower head [phone]) Additional Comments: daughters are supportive Prior Function Level of Independence: Needs assistance Gait / Transfers Assistance Needed: Modif I with RW ADL's / Homemaking Assistance Needed:  assisted for cooking and  housekeeping/laundry Communication Communication: No difficulties Dominant Hand: Right    Cognition  Cognition Arousal/Alertness: Awake/alert Behavior During Therapy: WFL for tasks assessed/performed Overall Cognitive Status: Within Functional Limits for tasks assessed    Extremity/Trunk Assessment Upper Extremity Assessment Upper Extremity Assessment: Defer to OT evaluation Lower Extremity Assessment Lower Extremity Assessment: Generalized weakness Cervical / Trunk Assessment Cervical / Trunk Assessment: Normal   Balance Balance Balance Assessed: Yes Static Standing Balance Static Standing - Balance Support: Bilateral upper extremity supported;During functional activity Static Standing - Level of Assistance: 4: Min assist Static Standing - Comment/# of Minutes: 3 High Level Balance High Level Balance Activites: Sudden stops;Turns;Head turns High Level Balance Comments: LOB with challenges  End of Session PT - End of Session Equipment Utilized During Treatment: Gait belt;Oxygen Activity Tolerance: Patient limited by fatigue Patient left: in bed;with call bell/phone within reach Nurse Communication: Mobility status       INGOLD,Jadine Brumley 04/03/2013, 9:42 AM [facility] [phone] (pager)

## 2013-04-03 NOTE — Progress Notes (Signed)
[facility] PROGRESS NOTE Interim History: 79 y.o. female has a past medical history of Hypertension; Osteoporosis; Atrial fib/flutter, transient; CHF (congestive heart failure); Vertigo; Chronic renal insufficiency; Hypothyroidism; Arthritis; Anxiety; On home oxygen therapy; and Tachy-brady syndrome. Presented with Patient have been coughing for the past 3 weeks but work up has been negative so far. CXR unremarkable. Tonight patient became light headed after having a BM. She was on oxygen at night but since having the cough she required o2 around the clock and was found t be hypoxic on 2 L today. Patient now requiring 3 L  Filed Weights   04/01/13 0524 04/02/13 0459 04/03/13 0530  Weight: 67.268 kg (148 lb 4.8 oz) 65 kg (143 lb 4.8 oz) 65.227 kg (143 lb 12.8 oz)        Intake/Output Summary (Last 24 hours) at 04/03/13 0834 Last data filed at 04/03/13 0534  Gross per 24 hour  Intake    923 ml  Output   1876 ml  Net   -953 ml     Assessment/Plan: Acute on chronic respiratory failure due to Acute diastolic heart failure: - Weight cont to decrease. UOP has decrease, Increase IV lasix. Check Urinary sodium to check renal perfusion. - Estimated dry weight 133 lb. Still has JVD minimal lower extremity swelling. Able to sleep flat. - Daily b-met, strict I and O's - Baseline Cr 1.6: 7.29.2014. - V/Q scan negative 11.24.2014  Lightheadedness/ Hypoxia: - resolved, multifactorial due decrease perfusion with CHF exacerbation and hypoxia  Essential hypertension - stable. Continue current regimen  AKI on Chronic kidney disease, stage III (moderate) - worsening renal function, compared to 7.29.2014, which was 1.6.  ? Is due to worsening heart failure - hold spironolactone Cr is 2.0.   Atrial fibrillation - Rate controlled, on digoxin.   Hypothyroid    Code Status: DNR/DNI Family Communication: daughter  Disposition Plan: inpatient   Consultants:  none  Procedures: ECHO  11.24.2014 :ejection fraction was in the range of 60% to 65%. Wall motion was normal; there were no regional wall motion abnormalities. Features are consistent with a pseudonormal left ventricular filling pattern, with concomitant abnormal relaxation and increased filling pressure (grade 2 diastolic dysfunction).  Antibiotics:  none  HPI/Subjective: Wanting to go home. Able to sleep flat.  Objective: Filed Vitals:   04/02/13 1300 04/02/13 1834 04/02/13 2013 04/03/13 0530  BP: 140/40 138/75 105/87 124/57  Pulse: 66 70 68 68  Temp: 97.4 F (36.3 C)  97.4 F (36.3 C) 98.4 F (36.9 C)  TempSrc: Oral  Oral Oral  Resp: 18  19 17   Height:      Weight:    65.227 kg (143 lb 12.8 oz)  SpO2: 96%  92% 95%     Exam:  General: Alert, awake, oriented x3, in no acute distress.  HEENT: No bruits, no goiter.  Heart: Regular rate and rhythm, without murmurs, rubs, gallops.  Lungs: Good air movement, bilateral air movement.  Abdomen: Soft, nontender, nondistended, positive bowel sounds.  Neuro: Grossly intact, nonfocal.   Data Reviewed: Basic Metabolic Panel:  Recent Labs Lab 03/29/13 1547 03/31/13 2135 04/01/13 0757 04/02/13 0600 04/03/13 0614  NA 135 136 139 142 141  K 4.4 3.9 3.7 3.6 3.8  CL 102 100 103 103 104  CO2 24 22 23 25 25   GLUCOSE 92 113* 96 70 87  BUN 34* 36* 33* 26* 27*  CREATININE 2.1* 2.11* 2.04* 2.01* 2.07*  CALCIUM 9.4 9.2 9.1 9.0 9.1  MG  --   --  2.4  --   --   PHOS  --   --  2.8  --   --    Liver Function Tests:  Recent Labs Lab 03/31/13 2135 04/01/13 0757  AST 23 22  ALT 19 18  ALKPHOS 68 64  BILITOT 0.3 0.5  PROT 6.8 6.7  ALBUMIN 3.6 3.6   No results found for this basename: LIPASE, AMYLASE,  in the last 168 hours No results found for this basename: AMMONIA,  in the last 168 hours CBC:  Recent Labs Lab 03/29/13 1547 03/31/13 2135 04/01/13 0757  WBC 10.9* 10.7* 11.7*  NEUTROABS 8.1* 8.1*  --   HGB 13.1 12.4 12.3  HCT 39.4 38.4 38.0   MCV 95.4 99.7 99.7  PLT 290.0 235 246   Cardiac Enzymes:  Recent Labs Lab 04/01/13 0757 04/01/13 1115 04/01/13 1905  TROPONINI <0.30 <0.30 <0.30   BNP (last 3 results)  Recent Labs  03/29/13 1547 03/31/13 2125 04/03/13 0614  PROBNP 355.0* 3268.0* 2567.0*   CBG: No results found for this basename: GLUCAP,  in the last 168 hours  No results found for this or any previous visit (from the past 240 hour(s)).   Studies: Ct Chest Wo Contrast  04/01/2013   CLINICAL DATA:  Shortness of breath  EXAM: CT CHEST WITHOUT CONTRAST  TECHNIQUE: Multidetector CT imaging of the chest was performed following the standard protocol without IV contrast.  COMPARISON:  04/01/2013, 03/31/2013  FINDINGS: Small bilateral pleural effusions are identified right greater than left. Mild interstitial changes are seen in both lungs without focal confluent infiltrate. No definitive pulmonary nodule or mass lesion is seen. Linear atelectatic changes are noted in the left lower lobe as well. A pacing device is noted on the left. No significant hilar or mediastinal adenopathy is noted. Coronary calcifications are seen. Visualized upper abdomen demonstrates findings of prior cholecystectomy is well as ingested tablets. No acute bony abnormality is seen. Diffuse degenerative change of the thoracic spine is noted.  IMPRESSION: Bilateral pleural effusions right greater than left.  Diffuse stable interstitial changes are noted bilaterally. Mild left basilar atelectasis is seen.   Electronically Signed   By: Alcide Clever M.D.   On: 04/01/2013 17:29   Nm Pulmonary Perf And Vent  04/01/2013   CLINICAL DATA:  79 year old with shortness of breath.  EXAM: NUCLEAR MEDICINE VENTILATION - PERFUSION LUNG SCAN  TECHNIQUE: Ventilation images were obtained in multiple projections using inhaled aerosol technetium 99 M DTPA. Perfusion images were obtained in multiple projections after intravenous injection of Tc-39m MAA.  COMPARISON:   Chest radiograph 03/31/2013  RADIOPHARMACEUTICALS:  40 mCi Tc-26m DTPA aerosol and 6 mCi Tc-88m MAA  FINDINGS: Ventilation: There is clumping of the radiopharmaceutical in the hilar regions and limited evaluation of the ventilation.  Perfusion: There is a focal area of photopenia in the left mid chest consistent with the pacemaker. Otherwise, there is no significant segmental or wedge-shaped perfusion abnormality.  IMPRESSION: Low probability for pulmonary embolism.   Electronically Signed   By: Richarda Overlie M.D.   On: 04/01/2013 16:56    Scheduled Meds: . amLODipine  5 mg Oral Daily  . aspirin  81 mg Oral Daily  . atenolol  25 mg Oral BID  . budesonide (PULMICORT) nebulizer solution  0.25 mg Nebulization BID  . buPROPion  300 mg Oral Daily  . digoxin  0.0625 mg Oral Daily  . docusate sodium  100 mg Oral BID  . enoxaparin (LOVENOX) injection  30 mg Subcutaneous  Q24H  . furosemide  60 mg Intravenous Q12H  . levothyroxine  88 mcg Oral QAC breakfast  . pantoprazole  40 mg Oral Daily  . potassium chloride  10 mEq Oral Daily  . sodium chloride  3 mL Intravenous Q12H  . sodium chloride  3 mL Intravenous Q12H  . spironolactone  25 mg Oral q morning - 10a  . trimethoprim  100 mg Oral Daily   Continuous Infusions:    Marinda Elk  [facility] Pager [phone]. If 8PM-8AM, please contact night-coverage at [email or web address], password [facility] 04/03/2013, 8:34 AM  LOS: 3 days

## 2013-04-03 NOTE — Progress Notes (Signed)
SATURATION QUALIFICATIONS: (This note is used to comply with regulatory documentation for home oxygen)  Patient Saturations on Room Air at Rest = 93%  Patient Saturations on Room Air while Ambulating = 84%  Patient Saturations on 2 Liters of oxygen while Ambulating = 92%  Please briefly explain why patient needs home oxygen:Pt desats with activity on RA.  Will need home O2,  Thanks. [facility] [phone] (pager)

## 2013-04-03 NOTE — Evaluation (Signed)
Occupational Therapy Evaluation Patient Details Name: [name] MRN: [id number] DOB: [birth year] Today's Date: 04/03/2013 Time: 1191-4782 OT Time Calculation (min): 20 min  OT Assessment / Plan / Recommendation History of present illness Pt admitted with near syncopal episode after using restroom at home. Pt with acute on chronic respiratory failure 2* HF, PMH:  vertigo, HTN, arthritis, afib, CKD.   Clinical Impression   Pt presents with decreased activity tolerance, DOE without 02, and impaired balance interfering with ability to perform ADL and placing her at risk for falls.  Pt noted to desaturate on RA with ambulation to 84%.  Recommended pt use 02 with any exertion and 24 hour supervision at home.  Agree with 24.   OT Assessment  Patient does not need any further OT services    Follow Up Recommendations  No OT follow up;Supervision/Assistance - 24 hour    Barriers to Discharge      Equipment Recommendations  None recommended by OT    Recommendations for Other Services    Frequency       Precautions / Restrictions Precautions Precautions: Fall Restrictions Weight Bearing Restrictions: No   Pertinent Vitals/Pain No pain   ADL  Eating/Feeding: Independent Grooming: Brushing hair;Supervision/safety Where Assessed - Grooming: Unsupported standing Upper Body Bathing: Supervision/safety Where Assessed - Upper Body Bathing: Unsupported sitting Lower Body Bathing: Min guard Where Assessed - Lower Body Bathing: Unsupported sitting;Supported sit to stand Upper Body Dressing: Supervision/safety Where Assessed - Upper Body Dressing: Unsupported sitting Lower Body Dressing: Min guard Where Assessed - Lower Body Dressing: Unsupported sitting;Supported sit to stand Toilet Transfer: Min Pension scheme manager Method: Sit to Barista: Comfort height toilet Toileting - Architect and Hygiene: Supervision/safety Where Assessed - International aid/development worker and Hygiene: Sit on 3-in-1 or toilet Equipment Used: Rolling walker (02--2L) Transfers/Ambulation Related to ADLs: min assist (LOB x 1), pt with dizziness, desats with activity to mid 80s ADL Comments: Pt able to reach feet in sitting.  Limited activity tolerance.  Recommended use of 02 when exerting including showering and ambulation.    OT Diagnosis:    OT Problem List: Decreased activity tolerance;Impaired balance (sitting and/or standing);Cardiopulmonary status limiting activity OT Treatment Interventions:     OT Goals(Current goals can be found in the care plan section) Acute Rehab OT Goals Patient Stated Goal: home today with caregiver assist and daughter  Visit Information  Last OT Received On: 04/03/13 Assistance Needed: +1 History of Present Illness: Pt admitted with near syncopal episode after using restroom at home. Pt with acute on chronic respiratory failure 2* HF, PMH:  vertigo, HTN, arthritis, afib, CKD.       Prior Functioning     Home Living Family/patient expects to be discharged to:: Private residence Living Arrangements: Alone Available Help at Discharge: Personal care attendant;Available 24 hours/day Type of Home: House Home Access: Stairs to enter [employer] of Steps: 5 Entrance Stairs-Rails: Right Home Layout: One level Home Equipment: Walker - 2 wheels;Shower seat;Hand held shower head [phone]) Additional Comments: daughters are supportive Prior Function Level of Independence: Needs assistance ADL's / Homemaking Assistance Needed: assisted for cooking and housekeeping/laundry Communication Communication: No difficulties Dominant Hand: Right         Vision/Perception Vision - History Baseline Vision: Wears glasses all the time Patient Visual Report: No change from baseline   Cognition  Cognition Arousal/Alertness: Awake/alert Behavior During Therapy: WFL for tasks assessed/performed Overall Cognitive Status:  Within Functional Limits for tasks assessed  Extremity/Trunk Assessment Upper Extremity Assessment Upper Extremity Assessment: Overall WFL for tasks assessed Lower Extremity Assessment Lower Extremity Assessment: Defer to PT evaluation Cervical / Trunk Assessment Cervical / Trunk Assessment: Normal     Mobility Bed Mobility Bed Mobility: Supine to Sit Supine to Sit: 6: Modified independent (Device/Increase time) Details for Bed Mobility Assistance: moves slowly, hx of vertigo Transfers Transfers: Sit to Stand;Stand to Sit Sit to Stand: 5: Supervision Stand to Sit: 5: Supervision     Exercise     Balance     End of Session OT - End of Session Equipment Utilized During Treatment: Rolling walker Activity Tolerance: Patient limited by fatigue (dizziness) Patient left:  (with PT)  GO     Evern Bio 04/03/2013, 8:52 AM

## 2013-04-03 NOTE — Progress Notes (Signed)
The patient stated that she did not feel well this morning, but could not really describe what was bothering her.  She stated that she was just worn out.  Otherwise, she did not have any acute changes overnight and slept for most of the night.

## 2013-04-04 DIAGNOSIS — N19 Unspecified kidney failure: Secondary | ICD-10-CM

## 2013-04-04 DIAGNOSIS — I1310 Hypertensive heart and chronic kidney disease without heart failure, with stage 1 through stage 4 chronic kidney disease, or unspecified chronic kidney disease: Secondary | ICD-10-CM | POA: Diagnosis present

## 2013-04-04 LAB — BASIC METABOLIC PANEL
BUN: 26 mg/dL — ABNORMAL HIGH (ref 6–23)
CO2: 23 mEq/L (ref 19–32)
Calcium: 8.9 mg/dL (ref 8.4–10.5)
Chloride: 103 mEq/L (ref 96–112)
Creatinine, Ser: 1.82 mg/dL — ABNORMAL HIGH (ref 0.50–1.10)
GFR calc non Af Amer: 23 mL/min — ABNORMAL LOW (ref >90)
Glucose, Bld: 81 mg/dL (ref 70–99)

## 2013-04-04 MED ORDER — FUROSEMIDE 80 MG PO TABS
80.0000 mg | ORAL_TABLET | Freq: Two times a day (BID) | ORAL | Status: DC
  Filled 2013-04-04 (×3): qty 1

## 2013-04-04 MED ORDER — POTASSIUM CHLORIDE CRYS ER 20 MEQ PO TBCR
40.0000 meq | EXTENDED_RELEASE_TABLET | Freq: Once | ORAL | Status: AC
  Administered 2013-04-04: 09:00:00 40 meq via ORAL
  Filled 2013-04-04: qty 2

## 2013-04-04 MED ORDER — FUROSEMIDE 80 MG PO TABS
80.0000 mg | ORAL_TABLET | Freq: Two times a day (BID) | ORAL | Status: DC
  Administered 2013-04-04: 80 mg via ORAL
  Filled 2013-04-04 (×4): qty 1

## 2013-04-04 MED ORDER — FUROSEMIDE 10 MG/ML IJ SOLN
80.0000 mg | Freq: Three times a day (TID) | INTRAMUSCULAR | Status: AC
  Administered 2013-04-04: 09:00:00 80 mg via INTRAVENOUS
  Filled 2013-04-04: qty 8

## 2013-04-04 MED ORDER — POTASSIUM CHLORIDE CRYS ER 20 MEQ PO TBCR
40.0000 meq | EXTENDED_RELEASE_TABLET | Freq: Two times a day (BID) | ORAL | Status: AC
  Administered 2013-04-04 (×2): 40 meq via ORAL
  Filled 2013-04-04 (×2): qty 2

## 2013-04-04 MED ORDER — ASPIRIN 81 MG PO CHEW
81.0000 mg | CHEWABLE_TABLET | Freq: Every day | ORAL | Status: DC
  Administered 2013-04-04 – 2013-04-05 (×2): 81 mg via ORAL
  Filled 2013-04-04 (×2): qty 1

## 2013-04-04 MED ORDER — SODIUM CHLORIDE 0.9 % IJ SOLN
3.0000 mL | Freq: Two times a day (BID) | INTRAMUSCULAR | Status: DC
  Administered 2013-04-04 – 2013-04-05 (×3): 3 mL via INTRAVENOUS

## 2013-04-04 NOTE — Plan of Care (Signed)
Problem: Phase I Progression Outcomes Goal: EF % per last Echo/documented,Core Reminder form on chart Outcome: Completed/Met Date Met:  04/04/13 60-65%

## 2013-04-04 NOTE — Progress Notes (Signed)
Patient refusing bed alarm.  Patient educated on benefits of bed alarm but states that she will not get out of bed without calling for help and that she does not "need" the bed alarm.  Benefits of bed alarm reiterated to patient.  Will continue to monitor and educate.

## 2013-04-04 NOTE — Progress Notes (Signed)
Agree with Emily Miliano RN BSN notes, assessment and charting,.  Signing off 

## 2013-04-04 NOTE — Progress Notes (Addendum)
[facility] PROGRESS NOTE Interim History: 79 y.o. female has a past medical history of Hypertension; Osteoporosis; Atrial fib/flutter, transient; CHF (congestive heart failure); Vertigo; Chronic renal insufficiency; Hypothyroidism; Arthritis; Anxiety; On home oxygen therapy; and Tachy-brady syndrome. Presented with Patient have been coughing for the past 3 weeks but work up has been negative so far. CXR unremarkable. Tonight patient became light headed after having a BM. She was on oxygen at night but since having the cough she required o2 around the clock and was found t be hypoxic on 2 L today. Patient now requiring 3 L  Filed Weights   04/02/13 0459 04/03/13 0530 04/04/13 0457  Weight: 65 kg (143 lb 4.8 oz) 65.227 kg (143 lb 12.8 oz) 63.821 kg (140 lb 11.2 oz)        Intake/Output Summary (Last 24 hours) at 04/04/13 0805 Last data filed at 04/04/13 0555  Gross per 24 hour  Intake    960 ml  Output   2340 ml  Net  -1380 ml     Assessment/Plan: Acute on chronic respiratory failure due to Acute diastolic heart failure/cardiorenal syndrome: - Weight cont to decrease. UOP has inmproved, change lasix to orals - Estimated dry weight 133 lb. no JVD minimal lower extremity swelling. Able to sleep flat. - Daily b-met, strict I and O's, Cr trending down. - Baseline Cr 1.6: 7.29.2014. - V/Q scan negative 11.24.2014  Lightheadedness/ Hypoxia: - resolved, multifactorial due decrease perfusion with CHF exacerbation and hypoxia  Essential hypertension - stable. Continue current regimen  AKI on Chronic kidney disease, stage III (moderate) - Improved renal function. - hold spironolactone Cr is high.   Atrial fibrillation - Rate controlled, on digoxin.     Code Status: DNR/DNI Family Communication: daughter  Disposition Plan: inpatient   Consultants:  none  Procedures: ECHO 11.24.2014 :ejection fraction was in the range of 60% to 65%. Wall motion was normal; there were  no regional wall motion abnormalities. Features are consistent with a pseudonormal left ventricular filling pattern, with concomitant abnormal relaxation and increased filling pressure (grade 2 diastolic dysfunction).  Antibiotics:  none  HPI/Subjective:  Able to sleep flat. No complains Objective: Filed Vitals:   04/03/13 1400 04/03/13 1958 04/03/13 2014 04/04/13 0457  BP: 132/45  117/48 128/46  Pulse: 66  71 60  Temp: 97.8 F (36.6 C)  97.7 F (36.5 C) 98 F (36.7 C)  TempSrc: Oral  Oral Oral  Resp: 20  20 20   Height:      Weight:    63.821 kg (140 lb 11.2 oz)  SpO2: 98% 96% 97% 96%     Exam:  General: Alert, awake, oriented x3, in no acute distress.  HEENT: No bruits, no goiter. No jvd Heart: Regular rate and rhythm, without murmurs, rubs, gallops.  Lungs: Good air movement, bilateral air movement.  Abdomen: Soft, nontender, nondistended, positive bowel sounds.  Neuro: Grossly intact, nonfocal.   Data Reviewed: Basic Metabolic Panel:  Recent Labs Lab 03/31/13 2135 04/01/13 0757 04/02/13 0600 04/03/13 0614 04/04/13 0548  NA 136 139 142 141 139  K 3.9 3.7 3.6 3.8 3.4*  CL 100 103 103 104 103  CO2 22 23 25 25 23   GLUCOSE 113* 96 70 87 81  BUN 36* 33* 26* 27* 26*  CREATININE 2.11* 2.04* 2.01* 2.07* 1.82*  CALCIUM 9.2 9.1 9.0 9.1 8.9  MG  --  2.4  --   --   --   PHOS  --  2.8  --   --   --  Liver Function Tests:  Recent Labs Lab 03/31/13 2135 04/01/13 0757  AST 23 22  ALT 19 18  ALKPHOS 68 64  BILITOT 0.3 0.5  PROT 6.8 6.7  ALBUMIN 3.6 3.6   No results found for this basename: LIPASE, AMYLASE,  in the last 168 hours No results found for this basename: AMMONIA,  in the last 168 hours CBC:  Recent Labs Lab 03/29/13 1547 03/31/13 2135 04/01/13 0757  WBC 10.9* 10.7* 11.7*  NEUTROABS 8.1* 8.1*  --   HGB 13.1 12.4 12.3  HCT 39.4 38.4 38.0  MCV 95.4 99.7 99.7  PLT 290.0 235 246   Cardiac Enzymes:  Recent Labs Lab 04/01/13 0757  04/01/13 1115 04/01/13 1905  TROPONINI <0.30 <0.30 <0.30   BNP (last 3 results)  Recent Labs  03/29/13 1547 03/31/13 2125 04/03/13 0614  PROBNP 355.0* 3268.0* 2567.0*   CBG: No results found for this basename: GLUCAP,  in the last 168 hours  No results found for this or any previous visit (from the past 240 hour(s)).   Studies: No results found.  Scheduled Meds: . amLODipine  5 mg Oral Daily  . aspirin  81 mg Oral Daily  . atenolol  25 mg Oral BID  . budesonide (PULMICORT) nebulizer solution  0.25 mg Nebulization BID  . buPROPion  300 mg Oral Daily  . digoxin  0.0625 mg Oral Daily  . docusate sodium  100 mg Oral BID  . enoxaparin (LOVENOX) injection  30 mg Subcutaneous Q24H  . furosemide  80 mg Intravenous TID  . levothyroxine  88 mcg Oral QAC breakfast  . pantoprazole  40 mg Oral Daily  . potassium chloride  40 mEq Oral BID   Continuous Infusions:    Marinda Elk  [facility] Pager [phone]. If 8PM-8AM, please contact night-coverage at [email or web address], password [facility] 04/04/2013, 8:05 AM  LOS: 4 days

## 2013-04-04 NOTE — Progress Notes (Signed)
Explained to patient the use of the bed alarm as a reminder to call for help when getting out of bed.  Patient voiced understanding.  Patient states no additional needs at this time.  Phone and call light within reach.  Will continue to monitor.

## 2013-04-05 LAB — BASIC METABOLIC PANEL
BUN: 25 mg/dL — ABNORMAL HIGH (ref 6–23)
CO2: 25 mEq/L (ref 19–32)
Calcium: 9.2 mg/dL (ref 8.4–10.5)
Creatinine, Ser: 2.01 mg/dL — ABNORMAL HIGH (ref 0.50–1.10)
GFR calc non Af Amer: 21 mL/min — ABNORMAL LOW (ref >90)
Glucose, Bld: 83 mg/dL (ref 70–99)
Potassium: 4.3 mEq/L (ref 3.5–5.1)
Sodium: 140 mEq/L (ref 135–145)

## 2013-04-05 LAB — FOLATE RBC: RBC Folate: >1000 ng/mL — ABNORMAL HIGH (ref >280)

## 2013-04-05 NOTE — Progress Notes (Signed)
Patient being discharged to home, transported via her daughter.  IV removed prior to discharge, IV site clean, dry, and intact.  Discharge teaching performed prior to discharge, patient and patient's daughter voiced understanding of discharge education.  Called patient's primary care provider to set up follow up appointment, however office is closed due to holiday yesterday.  Patient's daughter stated that she would call the office on Monday to set up an appointment.

## 2013-04-05 NOTE — Care Management Note (Addendum)
   CARE MANAGEMENT NOTE 04/05/2013  Patient:  [name], [name]   Account Number:  [id number]  Date Initiated:  04/05/2013  Documentation initiated by:  Terriann Difonzo  Subjective/Objective Assessment:   MD request for HHOT and HHPT.  Met with pt who selected [facility] for HHPT and OT.     Action/Plan:   [facility] notified of HH needs.   Anticipated DC Date:  04/05/2013   Anticipated DC Plan:  HOME W HOME HEALTH SERVICES         Choice offered to / List presented to:          [facility] arranged  HH-2 PT  HH-3 OT    [facility] for CHF program [facility] agency  [facility]   Status of service:  Completed, signed off Medicare Important Message given?   (If response is "NO", the following Medicare IM given date fields will be blank) Date Medicare IM given:   Date Additional Medicare IM given:    Discharge Disposition:  HOME W HOME HEALTH SERVICES  Per UR Regulation:    If discussed at Long Length of Stay Meetings, dates discussed:    Comments:

## 2013-04-05 NOTE — Progress Notes (Signed)
Pt O4x, weight unchanged. Pt states she feel "rough this morning". Pt describe her feel as tired and weak the morning. No complaints of SOB or CP .  Will continue to monitor

## 2013-04-05 NOTE — Discharge Summary (Signed)
Physician Discharge Summary  [name] AVW:[id number] DOB: February 25, 1920 DOA: 03/31/2013  PCP: Carollee Herter, MD  Admit date: 03/31/2013 Discharge date: 04/05/2013  Time spent: 40 minutes  Recommendations for Outpatient Follow-up:  1. Follow up with PCP in 1 week. 2. b-met in 1 week.  Discharge Diagnoses:  Principal Problem:   Acute diastolic heart failure Active Problems:   Atrial fibrillation   Hypothyroid   Essential hypertension   Chronic diastolic heart failure   Chronic kidney disease, stage III (moderate)   Lightheadedness   Hypoxia   Cardiorenal syndrome with renal failure   Discharge Condition: stable  Diet recommendation: heart healthy low sodium  Filed Weights   04/03/13 0530 04/04/13 0457 04/05/13 0630  Weight: 65.227 kg (143 lb 12.8 oz) 63.821 kg (140 lb 11.2 oz) 63.549 kg (140 lb 1.6 oz)    History of present illness:  79 y.o. female  has a past medical history of Hypertension; Osteoporosis; Atrial fib/flutter, transient; CHF (congestive heart failure); Vertigo; Chronic renal insufficiency; Hypothyroidism; Arthritis; Anxiety; On home oxygen therapy; and Tachy-brady syndrome. Presented with Patient have been coughing for the past 3 weeks but work up has been negative so far. CXR unremarkable. Tonight patient became light headed after having a BM. She was on oxygen at night but since having the cough she required o2 around the clock and was found t be hypoxic on 2 L today. Patient now requiring 3 L. Denies any chest pain.   Hospital Course:  Acute on chronic respiratory failure due to Acute diastolic heart failure/cardiorenal syndrome:  - started on IV lasix. - Weight  decrease. UOP has inmproved, change lasix to orals  - Estimated dry weight 140 lb.  JVD improved.ble to sleep flat.  - Cr trending down.  - Baseline Cr 1.6: 7.29.2014. On the day of d/c cr was 2.0 will need a b-met in 1 week. - V/Q scan negative 11.24.2014   Lightheadedness/  Hypoxia:  - resolved, multifactorial due decrease perfusion with CHF exacerbation and hypoxia   Essential hypertension  - stable. Continue current regimen   AKI on Chronic kidney disease, stage III (moderate)  - Improved renal function.  - hold spironolactone Cr is high.   Atrial fibrillation  - Rate controlled, on digoxin.   Procedures:  Vq scan  Consultations:  none  Discharge Exam: Filed Vitals:   04/05/13 0630  BP: 130/59  Pulse: 71  Temp: 97.7 F (36.5 C)  Resp: 19    General: A&O x3 Cardiovascular: RRR Respiratory: good air movement CTA B/L  Discharge Instructions      Discharge Orders   Future Appointments Provider Department Dept Phone   04/12/2013 2:00 PM [facility] [place] Office [phone]   Future Orders Complete By Expires   Diet - low sodium heart healthy  As directed    Increase activity slowly  As directed        Medication List    STOP taking these medications       spironolactone 25 MG tablet  Commonly known as:  ALDACTONE      TAKE these medications       amLODipine 5 MG tablet  Commonly known as:  NORVASC  Take 5 mg by mouth daily.     aspirin 81 MG tablet  Take 81 mg by mouth daily.     atenolol 25 MG tablet  Commonly known as:  TENORMIN  Take 1 tablet (25 mg total) by mouth 2 (two) times daily.  benzonatate 100 MG capsule  Commonly known as:  TESSALON  Take 1 capsule (100 mg total) by mouth 2 (two) times daily as needed for cough.     buPROPion 150 MG 24 hr tablet  Commonly known as:  WELLBUTRIN XL  Take 300 mg by mouth daily.     chlorpheniramine-HYDROcodone 10-8 MG/5ML Lqcr  Commonly known as:  TUSSIONEX PENNKINETIC ER  Take 5 mLs by mouth every 12 (twelve) hours as needed for cough (cough).     Cranberry 450 MG Tabs  Take 1 tablet by mouth daily.     digoxin 0.125 MG tablet  Commonly known as:  LANOXIN  Pt to take 1/2 tablet once a day.     estradiol 0.1 MG/GM vaginal cream   Commonly known as:  ESTRACE  Place 2 g vaginally at bedtime.     furosemide 80 MG tablet  Commonly known as:  LASIX  Take 80 mg by mouth 2 (two) times daily.     hyoscyamine 0.125 MG SL tablet  Commonly known as:  LEVSIN/SL  Place 1 tablet (0.125 mg total) under the tongue every 4 (four) hours as needed for cramping.     ICAPS PO  Take 1 capsule by mouth daily.     lactose free nutrition Liqd  Take 237 mLs by mouth 2 (two) times daily between meals.     levothyroxine 88 MCG tablet  Commonly known as:  SYNTHROID, LEVOTHROID  Take 1 tablet (88 mcg total) by mouth daily before breakfast.     loratadine 10 MG tablet  Commonly known as:  CLARITIN  Take 10 mg by mouth as needed for allergies.     magnesium hydroxide 400 MG/5ML suspension  Commonly known as:  MILK OF MAGNESIA  Take 30 mLs by mouth daily as needed for constipation.     NON FORMULARY  Take 1 tablet by mouth daily. Ultra Flora IB once a day     omeprazole 20 MG capsule  Commonly known as:  PRILOSEC  Take 40 mg by mouth every morning.     potassium chloride 10 MEQ tablet  Commonly known as:  K-DUR,KLOR-CON  Take 1 tablet (10 mEq total) by mouth daily.     traMADol 50 MG tablet  Commonly known as:  ULTRAM  Take 50 mg by mouth at bedtime as needed for pain.     trimethoprim 100 MG tablet  Commonly known as:  TRIMPEX  Take 100 mg by mouth daily.     TYLENOL ARTHRITIS PAIN 650 MG CR tablet  Generic drug:  acetaminophen  Take 650 mg by mouth every 8 (eight) hours as needed for pain.     VITAMIN B-12 PO  Take 1,000 mcg by mouth daily.     Vitamin D 2000 UNITS tablet  Take 2,000 Units by mouth daily.       Allergies  Allergen Reactions  . Quinidine Other (See Comments)    Skin peels  . Tiazac [Diltiazem Hcl] Hives    "don't remember how bad the reaction was" (04/24/2012)  . Warfarin Sodium Other (See Comments)    GI bleed  . Percocet [Oxycodone-Acetaminophen] Other (See Comments)    confusion  .  Promethazine Hcl Other (See Comments)    confusion  . Amiodarone   . Procainamide Other (See Comments)    Lupus reaction  . Septra [Sulfamethoxazole W/Trimethoprim (Co-Trimoxazole)] Other (See Comments)    Dizziness and tremor "don't remember how bad the reaction was" (04/24/2012)  . Amoxicillin Rash  .  Ciprofloxacin Rash  . Polocaine [Mepivacaine Hcl] Palpitations   Follow-up Information   Follow up with Carollee Herter, MD In 1 week. (hospital follow up)    Specialty:  Family Medicine   Contact information:   [address] [place] [address] [phone]        The results of significant diagnostics from this hospitalization (including imaging, microbiology, ancillary and laboratory) are listed below for reference.    Significant Diagnostic Studies: Dg Chest 2 View  03/21/2013   CLINICAL DATA:  Cough, shortness of breath for 2 weeks.  EXAM: CHEST  2 VIEW  COMPARISON:  12/04/2012.  FINDINGS: The heart size is enlarged. There is no focal consolidation. There is no pleural effusion or pneumothorax. There is mild bilateral interstitial thickening likely chronic. There is a dual lead cardiac pacer. There is left proximal humeral orthopedic hardware transfixing a healed humeral neck fracture.  IMPRESSION: No active cardiopulmonary disease.   Electronically Signed   By: Elige Ko   On: 03/21/2013 16:21   Ct Chest Wo Contrast  04/01/2013   CLINICAL DATA:  Shortness of breath  EXAM: CT CHEST WITHOUT CONTRAST  TECHNIQUE: Multidetector CT imaging of the chest was performed following the standard protocol without IV contrast.  COMPARISON:  04/01/2013, 03/31/2013  FINDINGS: Small bilateral pleural effusions are identified right greater than left. Mild interstitial changes are seen in both lungs without focal confluent infiltrate. No definitive pulmonary nodule or mass lesion is seen. Linear atelectatic changes are noted in the left lower lobe as well. A pacing device is noted  on the left. No significant hilar or mediastinal adenopathy is noted. Coronary calcifications are seen. Visualized upper abdomen demonstrates findings of prior cholecystectomy is well as ingested tablets. No acute bony abnormality is seen. Diffuse degenerative change of the thoracic spine is noted.  IMPRESSION: Bilateral pleural effusions right greater than left.  Diffuse stable interstitial changes are noted bilaterally. Mild left basilar atelectasis is seen.   Electronically Signed   By: Alcide Clever M.D.   On: 04/01/2013 17:29   Dg Esophagus  03/26/2013   CLINICAL DATA:  History of hiatal hernia reflux disease. Cough. Dysphagia with solids.  EXAM: ESOPHOGRAM/BARIUM SWALLOW  TECHNIQUE: Single contrast examination was performed using  thin barium.  COMPARISON:  CT abdomen and pelvis 10/30/2012.  FLUOROSCOPY TIME:  1 min, 31 seconds.  FINDINGS: The study is somewhat limited due to difficulty the patient had participating in the examination. No esophageal stricture, mass or evidence of inflammatory change is identified. No hiatal hernia is visualized. No gastroesophageal reflux was elicited. The patient had some difficulty clearing barium from the esophagus compatible with mild mid dysmotility. A 13 mm barium tablet passed easily into the stomach.  IMPRESSION: No acute finding.  Mild esophageal dysmotility.   Electronically Signed   By: Drusilla Kanner M.D.   On: 03/26/2013 13:28   Nm Pulmonary Perf And Vent  04/01/2013   CLINICAL DATA:  79 year old with shortness of breath.  EXAM: NUCLEAR MEDICINE VENTILATION - PERFUSION LUNG SCAN  TECHNIQUE: Ventilation images were obtained in multiple projections using inhaled aerosol technetium 99 M DTPA. Perfusion images were obtained in multiple projections after intravenous injection of Tc-11m MAA.  COMPARISON:  Chest radiograph 03/31/2013  RADIOPHARMACEUTICALS:  40 mCi Tc-45m DTPA aerosol and 6 mCi Tc-44m MAA  FINDINGS: Ventilation: There is clumping of the  radiopharmaceutical in the hilar regions and limited evaluation of the ventilation.  Perfusion: There is a focal area of photopenia in the left mid  chest consistent with the pacemaker. Otherwise, there is no significant segmental or wedge-shaped perfusion abnormality.  IMPRESSION: Low probability for pulmonary embolism.   Electronically Signed   By: Richarda Overlie M.D.   On: 04/01/2013 16:56   Dg Chest Port 1 View  03/31/2013   CLINICAL DATA:  Shortness of breath. Congestive heart failure. Weakness. Atrial fibrillation.  EXAM: PORTABLE CHEST - 1 VIEW  COMPARISON:  03/21/2013  FINDINGS: Mild cardiomegaly is stable. Chronic pulmonary interstitial prominence appears stable. No evidence of acute or superimposed infiltrate. No evidence of pleural effusion. Dual lead transvenous pacemaker remains in appropriate position. Fixation plate and screws are again seen in the left humerus.  IMPRESSION: Stable exam.  No acute findings.   Electronically Signed   By: Myles Rosenthal M.D.   On: 03/31/2013 21:51    Microbiology: No results found for this or any previous visit (from the past 240 hour(s)).   Labs: Basic Metabolic Panel:  Recent Labs Lab 03/31/13 2135 04/01/13 0757 04/02/13 0600 04/03/13 0614 04/04/13 0548 04/05/13 0542  NA 136 139 142 141 139 140  K 3.9 3.7 3.6 3.8 3.4* 4.3  CL 100 103 103 104 103 104  CO2 22 23 25 25 23 25   GLUCOSE 113* 96 70 87 81 83  BUN 36* 33* 26* 27* 26* 25*  CREATININE 2.11* 2.04* 2.01* 2.07* 1.82* 2.01*  CALCIUM 9.2 9.1 9.0 9.1 8.9 9.2  MG  --  2.4  --   --   --   --   PHOS  --  2.8  --   --   --   --    Liver Function Tests:  Recent Labs Lab 03/31/13 2135 04/01/13 0757  AST 23 22  ALT 19 18  ALKPHOS 68 64  BILITOT 0.3 0.5  PROT 6.8 6.7  ALBUMIN 3.6 3.6   No results found for this basename: LIPASE, AMYLASE,  in the last 168 hours No results found for this basename: AMMONIA,  in the last 168 hours CBC:  Recent Labs Lab 03/29/13 1547 03/31/13 2135  04/01/13 0757  WBC 10.9* 10.7* 11.7*  NEUTROABS 8.1* 8.1*  --   HGB 13.1 12.4 12.3  HCT 39.4 38.4 38.0  MCV 95.4 99.7 99.7  PLT 290.0 235 246   Cardiac Enzymes:  Recent Labs Lab 04/01/13 0757 04/01/13 1115 04/01/13 1905  TROPONINI <0.30 <0.30 <0.30   BNP: BNP (last 3 results)  Recent Labs  03/29/13 1547 03/31/13 2125 04/03/13 0614  PROBNP 355.0* 3268.0* 2567.0*   CBG: No results found for this basename: GLUCAP,  in the last 168 hours     Signed:  Marinda Elk  [facility] 04/05/2013, 8:23 AM

## 2013-04-05 NOTE — Progress Notes (Signed)
Agree with Emily Miliano RN BSN assessment and notes signing off   

## 2013-04-05 NOTE — Progress Notes (Signed)
Physical Therapy Treatment Patient Details Name: LEIGH BLAS MRN: [id number] DOB: 02-Sep-1919 Today's Date: 04/05/2013 Time: 0865-7846 PT Time Calculation (min): 9 min  PT Assessment / Plan / Recommendation  History of Present Illness Pt admitted with near syncopal episode after using restroom at home. Pt with acute on chronic respiratory failure 2* HF, PMH:  vertigo, HTN, arthritis, afib, CKD.   PT Comments   Pt admitted with above. Pt currently with functional limitations due to endurance vestibular and balance  deficits.  Pt will benefit from skilled PT to increase their independence and safety with mobility to allow discharge to the venue listed below.   Follow Up Recommendations  Home health PT;Supervision/Assistance - 24 hour (with vestibular rehab)                 Equipment Recommendations  None recommended by PT        Frequency Min 3X/week   Progress towards PT Goals Progress towards PT goals: Progressing toward goals  Plan Current plan remains appropriate    Precautions / Restrictions Precautions Precautions: Fall Restrictions Weight Bearing Restrictions: No   Pertinent Vitals/Pain VSS, no pain    Mobility  Bed Mobility Bed Mobility: Not assessed Transfers Transfers: Not assessed Ambulation/Gait Ambulation/Gait Assistance: Not tested (comment) Stairs: No Wheelchair Mobility Wheelchair Mobility: No    Exercises Other Exercises Other Exercises: Reviewed vestibular exercises and importance of performing these.  Daughter states that pt had therapist at home that had been seeing pt for vestibular treatment.     PT Goals (current goals can now be found in the care plan section)    Visit Information  Last PT Received On: 04/05/13 Assistance Needed: +1 History of Present Illness: Pt admitted with near syncopal episode after using restroom at home. Pt with acute on chronic respiratory failure 2* HF, PMH:  vertigo, HTN, arthritis, afib, CKD.    Subjective  Data  Subjective: "I am leaving today."   Cognition  Cognition Arousal/Alertness: Awake/alert Behavior During Therapy: WFL for tasks assessed/performed Overall Cognitive Status: Within Functional Limits for tasks assessed         End of Session PT - End of Session Equipment Utilized During Treatment: Gait belt Activity Tolerance: Patient limited by fatigue Patient left: in chair;with call bell/phone within reach;with family/visitor present Nurse Communication: Mobility status        INGOLD,Dealva Lafoy 04/05/2013, 1:11 PM [facility] [phone] (pager)

## 2013-04-09 ENCOUNTER — Telehealth: Payer: Self-pay | Admitting: Interventional Cardiology

## 2013-04-09 DIAGNOSIS — I5032 Chronic diastolic (congestive) heart failure: Secondary | ICD-10-CM

## 2013-04-09 NOTE — Telephone Encounter (Signed)
Returned call.pt daughter [name] given Dr.Smith instructions to increase lasix to 120mg  bid today. hold lasix tomorrow morning before appt with Dr.Smith.bmet at  appt tomorrow.she sts that pt bp had not been checked today but previus reading 140/70. pt daugher verbalized understanding and agreeable with plan

## 2013-04-09 NOTE — Telephone Encounter (Signed)
New message     C/o gaining wt, retaining fluid in legs, and urine output is low.  Have an appt tomorrow but daughter want her seen today or talk to the nurse.

## 2013-04-10 ENCOUNTER — Other Ambulatory Visit: Payer: Self-pay | Admitting: Interventional Cardiology

## 2013-04-10 ENCOUNTER — Ambulatory Visit (INDEPENDENT_AMBULATORY_CARE_PROVIDER_SITE_OTHER): Payer: MEDICARE | Admitting: Interventional Cardiology

## 2013-04-10 ENCOUNTER — Encounter: Payer: Self-pay | Admitting: Interventional Cardiology

## 2013-04-10 VITALS — BP 140/68 | HR 68 | Ht 65.0 in | Wt 146.0 lb

## 2013-04-10 DIAGNOSIS — I5032 Chronic diastolic (congestive) heart failure: Secondary | ICD-10-CM

## 2013-04-10 DIAGNOSIS — R630 Anorexia: Secondary | ICD-10-CM | POA: Insufficient documentation

## 2013-04-10 DIAGNOSIS — I10 Essential (primary) hypertension: Secondary | ICD-10-CM

## 2013-04-10 DIAGNOSIS — Z79899 Other long term (current) drug therapy: Secondary | ICD-10-CM

## 2013-04-10 DIAGNOSIS — I4891 Unspecified atrial fibrillation: Secondary | ICD-10-CM

## 2013-04-10 NOTE — Progress Notes (Signed)
Patient ID: [name], female   DOB: 05/24/1919, 79 y.o.   MRN: [id number]    [address], [address] [place], [place]  [address] Phone: [phone] Fax:  [phone]  Date:  04/10/2013   ID:  [name], DOB [birth year], MRN [id number]  PCP:  Carollee Herter, MD   ASSESSMENT:  1. Acute on chronic diastolic heart failure improving after diuresis with a more intense dose of furosemide (120 mg twice a day for the last 2 doses) 2. Atrial fibrillation with good rate control 3. Digoxin therapy with associated anorexia 4. Chronic kidney disease  PLAN:  1. Digoxin level and basic metabolic panel today 2. Discontinue digoxin 3. To keep her weight in the 140 pound range she will use 80 mg of furosemide twice a day if the weight is below 140; 120 mg every morning and 80 p.m. if the weight is between 140 and 142.5 pounds; and 120 mg twice a day if the weight is above 142.5 pounds.   SUBJECTIVE: [name] is a 79 y.o. female who had a recent hospital stay because of dizziness and cough. She diuresed 7 pounds while hospitalized. Spironolactone was discontinued. Since going home one week ago she is developing progressive swelling, cough, and dyspnea. O2 saturations decreased to less than 90% yesterday. Lower extremity edema was noted. Weight had increased to 144.5 pounds. I asked her to take 120 mg by mouth twice a day yesterday. She has lost 2-1/2 pounds. Her breathing is improved. Ambulation and lower extremity swelling have improved. Her appetite is poor. Creatinines while hospitalized were greater than 2.   Wt Readings from Last 3 Encounters:  04/10/13 146 lb (66.225 kg)  04/05/13 140 lb 1.6 oz (63.549 kg)  03/29/13 152 lb 1.9 oz (69.001 kg)     Past Medical History  Diagnosis Date  . Hypertension   . Osteoporosis   . Atrial fib/flutter, transient   . CHF (congestive heart failure)   . Vertigo   . Chronic renal insufficiency   . Hypothyroidism   .  Arthritis   . Anxiety   . On home oxygen therapy     uses O2 @ 2l/m nasally bedtime  . Tachy-brady syndrome      treated with amiodarone and permanent DDD pacemaker, 1993    Current Outpatient Prescriptions  Medication Sig Dispense Refill  . acetaminophen (TYLENOL ARTHRITIS PAIN) 650 MG CR tablet Take 650 mg by mouth every 8 (eight) hours as needed for pain.      Marland Kitchen amLODipine (NORVASC) 5 MG tablet Take 5 mg by mouth daily.      Marland Kitchen aspirin 81 MG tablet Take 81 mg by mouth daily.      Marland Kitchen atenolol (TENORMIN) 25 MG tablet Take 1 tablet (25 mg total) by mouth 2 (two) times daily.  90 tablet  3  . buPROPion (WELLBUTRIN XL) 150 MG 24 hr tablet Take 300 mg by mouth daily.      . chlorpheniramine-HYDROcodone (TUSSIONEX PENNKINETIC ER) 10-8 MG/5ML LQCR Take 5 mLs by mouth every 12 (twelve) hours as needed for cough (cough).  120 mL  0  . Cholecalciferol (VITAMIN D) 2000 UNITS tablet Take 2,000 Units by mouth daily.      . digoxin (LANOXIN) 0.125 MG tablet Pt to take 1/2 tablet once a day.      . estradiol (ESTRACE) 0.1 MG/GM vaginal cream Place 2 g vaginally at bedtime.       . furosemide (LASIX) 80 MG  tablet Take 80 mg by mouth 2 (two) times daily.       . hyoscyamine (LEVSIN/SL) 0.125 MG SL tablet Place 1 tablet (0.125 mg total) under the tongue every 4 (four) hours as needed for cramping.  15 tablet  0  . lactose free nutrition (BOOST PLUS) LIQD Take 237 mLs by mouth 2 (two) times daily between meals.       Marland Kitchen levothyroxine (SYNTHROID, LEVOTHROID) 88 MCG tablet Take 1 tablet (88 mcg total) by mouth daily before breakfast.  90 tablet  0  . loratadine (CLARITIN) 10 MG tablet Take 10 mg by mouth as needed for allergies.      . magnesium hydroxide (MILK OF MAGNESIA) 400 MG/5ML suspension Take 30 mLs by mouth daily as needed for constipation.      . NON FORMULARY Take 1 tablet by mouth daily. Ultra Flora IB once a day      . omeprazole (PRILOSEC) 20 MG capsule Take 40 mg by mouth every morning.      .  potassium chloride (K-DUR,KLOR-CON) 10 MEQ tablet Take 1 tablet (10 mEq total) by mouth daily.  90 tablet  3  . traMADol (ULTRAM) 50 MG tablet Take 50 mg by mouth at bedtime as needed for pain.      Marland Kitchen trimethoprim (TRIMPEX) 100 MG tablet Take 100 mg by mouth daily.      . Cranberry 450 MG TABS Take 1 tablet by mouth daily.      . Cyanocobalamin (VITAMIN B-12 PO) Take 1,000 mcg by mouth daily.       . Multiple Vitamins-Minerals (ICAPS PO) Take 1 capsule by mouth daily.       No current facility-administered medications for this visit.    Allergies:    Allergies  Allergen Reactions  . Quinidine Other (See Comments)    Skin peels  . Tiazac [Diltiazem Hcl] Hives    "don't remember how bad the reaction was" (04/24/2012)  . Warfarin Sodium Other (See Comments)    GI bleed  . Percocet [Oxycodone-Acetaminophen] Other (See Comments)    confusion  . Promethazine Hcl Other (See Comments)    confusion  . Amiodarone   . Procainamide Other (See Comments)    Lupus reaction  . Septra [Sulfamethoxazole W/Trimethoprim (Co-Trimoxazole)] Other (See Comments)    Dizziness and tremor "don't remember how bad the reaction was" (04/24/2012)  . Amoxicillin Rash  . Ciprofloxacin Rash  . Polocaine [Mepivacaine Hcl] Palpitations    Social History:  The patient  reports that she has never smoked. She has never used smokeless tobacco. She reports that she does not drink alcohol or use illicit drugs.   ROS:  Please see the history of present illness.   Decreased appetite   All other systems reviewed and negative.   OBJECTIVE: VS:  BP 140/68  Pulse 68  Ht 5\' 5"  (1.651 m)  Wt 146 lb (66.225 kg)  BMI 24.30 kg/m2 Well nourished, well developed, in no acute distress, stated age HEENT: normal Neck: JVD flat. Carotid bruit absent  Cardiac:  normal S1, S2; RRR; no murmur Lungs:  clear to auscultation bilaterally, no wheezing, rhonchi or rales Abd: soft, nontender, no hepatomegaly Ext: Edema trace  bilateral. Pulses 2+ Skin: warm and dry Neuro:  CNs 2-12 intact, no focal abnormalities noted  EKG:  Not performed       Signed, Darci Needle III, MD 04/10/2013 2:33 PM

## 2013-04-10 NOTE — Patient Instructions (Signed)
Your physician recommends that you continue on your current medications as directed. Please refer to the Current Medication list given to you today.  Labs today bmet and digoxin level( to be drawn at [facility] and results faxed to [phone] attn:Dr.Smith)  You have a follow up appointment scheduled 07/11/13 @1 :30pm

## 2013-04-12 ENCOUNTER — Other Ambulatory Visit: Payer: MEDICARE

## 2013-04-16 ENCOUNTER — Telehealth: Payer: Self-pay | Admitting: Interventional Cardiology

## 2013-04-16 LAB — BASIC METABOLIC PANEL
BUN/Creatinine Ratio: 15 (ref 11–26)
BUN: 28 mg/dL (ref 10–36)
CO2: 26 mmol/L (ref 18–29)
Chloride: 95 mmol/L — ABNORMAL LOW (ref 97–108)
GFR calc Af Amer: 28 mL/min/{1.73_m2} — ABNORMAL LOW (ref >59)
Sodium: 137 mmol/L (ref 134–144)

## 2013-04-16 NOTE — Telephone Encounter (Signed)
New Message,   Ramiro Harvest from [facility] called Pt has had a urine output of 500// Rales half way up her lungs on the right. And a quarter up on the left.. Very lethargic,, respiratory rate is 28 on 2 liters of oxygen. Please call back to discuss.

## 2013-04-16 NOTE — Telephone Encounter (Signed)
returned call [name]([facility]) pt weight  is 144.4lb today.pt given instructions by Dr.Smith on weight and lasix instructions at last o/v..adv pt per Dr.Smith since pt is lethargic there is something going on that is non cardiac. and to f/u with pt pcp. Thayer Ohm verbalized understanding and stated she will call Dr.Lalonde

## 2013-04-18 ENCOUNTER — Telehealth: Payer: Self-pay | Admitting: Interventional Cardiology

## 2013-04-18 DIAGNOSIS — I5032 Chronic diastolic (congestive) heart failure: Secondary | ICD-10-CM

## 2013-04-18 NOTE — Telephone Encounter (Signed)
New Message  Pt was recently seen// directrix was changed to the maximum dosage.. Pt is still gaining wt/// swollen in ankles// oxygen level is decreasing again.. Unable to take it (oxygen) off without it getting to low// coughing a little more// please call back to discuss.

## 2013-04-18 NOTE — Telephone Encounter (Signed)
returned call to pt daughter [name]. she sts that the pt is up in weight to 2.5lbs from her last o/v with Dr.Smith. Dawn sts that the pt has edema, and sob. pt O2 stat on Room air is 89-90. pt is on 2 liters of O2 continuesely.pt is currently taking max dose of lasix 120mg  bid  as instructed at pt last o/v visit. Pt urine output was 1000 cc yesterday.Per Dr.Smith pt is to increase lasix to 160mg  bid for no more that 3 doses.if weight returns to baseline before the 3 days pt to resume baseline dosage.pt is to come to the office on Monday 04/22/13 for a bmet., and Dr.Smith will take a look at pt.pt daughter adv if pt weight and symptoms have not decreased after 3 days of increased lasix pt may need to go to the ED for IV diuretic.pt daughter agreeable with plan and verbalized understanding.

## 2013-04-22 ENCOUNTER — Other Ambulatory Visit (INDEPENDENT_AMBULATORY_CARE_PROVIDER_SITE_OTHER): Payer: MEDICARE

## 2013-04-22 ENCOUNTER — Telehealth: Payer: Self-pay

## 2013-04-22 DIAGNOSIS — I5032 Chronic diastolic (congestive) heart failure: Secondary | ICD-10-CM

## 2013-04-22 LAB — BASIC METABOLIC PANEL
BUN: 32 mg/dL — ABNORMAL HIGH (ref 6–23)
CO2: 26 mEq/L (ref 19–32)
Calcium: 9.2 mg/dL (ref 8.4–10.5)
Chloride: 99 mEq/L (ref 96–112)
GFR: 25.71 mL/min — ABNORMAL LOW (ref >60.00)
Glucose, Bld: 103 mg/dL — ABNORMAL HIGH (ref 70–99)
Sodium: 134 mEq/L — ABNORMAL LOW (ref 135–145)

## 2013-04-22 NOTE — Telephone Encounter (Signed)
standing order for IV lasix 100mg  prn faxed to [facility] [phone]

## 2013-04-23 ENCOUNTER — Telehealth: Payer: Self-pay

## 2013-04-23 MED ORDER — FUROSEMIDE 80 MG PO TABS: 160.0000 mg | ORAL_TABLET | Freq: Two times a day (BID) | ORAL | Status: DC

## 2013-04-23 NOTE — Telephone Encounter (Signed)
Message copied by Jarvis Newcomer on Tue Apr 23, 2013  8:30 AM ------      Message from: Verdis Prime      Created: Mon Apr 22, 2013  7:25 PM       Labs are stable. Change lasix to 160 mg daily, in afternoon. Continue to weigh. Keep [place] informed of the weight. ------

## 2013-04-25 ENCOUNTER — Telehealth: Payer: Self-pay | Admitting: Interventional Cardiology

## 2013-04-25 NOTE — Telephone Encounter (Signed)
Returned call to pt daughter. Dawn given Dr.Smith instructions.To keep her weight in the 140 pound range she will use 80 mg of furosemide twice a day if the weight is below 140; 120 mg every morning and 80 p.m. if the weight is between 140 and 142.5 pounds; and 120 mg twice a day if the weight is above 142.5 pounds.if pt weight goes up like it did before pt daughter instructed to call the office.dawn was agreeable with plan and verbalized understanding.

## 2013-04-25 NOTE — Telephone Encounter (Signed)
New Problem:  Mrs [name] states her originals orders are not going work. She is requesting twice a week visits for several more weeks. [name] states she will discuss in more detail when the nurse calls her back.

## 2013-04-25 NOTE — Telephone Encounter (Signed)
Follow Up:  Pt's daughter, [name], is calling now... Dawn states her mom had 2300 cc output of urine last night. She states her mom has lost 3 lbs. Dawn wants to know if the pt needs to stay on the sliding scale of furosimide or go back to the dose she was put on Monday. Pt's daughter is requesting a call back.

## 2013-04-26 ENCOUNTER — Telehealth: Payer: Self-pay | Admitting: Interventional Cardiology

## 2013-04-26 NOTE — Telephone Encounter (Signed)
I would authorize the patient to be seen more frequently [facility] for HF f/u and whatever else they think she needs.

## 2013-04-26 NOTE — Telephone Encounter (Signed)
[name] from [facility] called regarding pt needing orders to continue to be seen. Thayer Ohm has only seen pt one day this week. Thayer Ohm would like to see pt at list 2 days a week for 3 to 4 week, because pt is still very sick. Thayer Ohm is aware that I will route this message to Dr. Katrinka Blazing for recommendations.

## 2013-04-26 NOTE — Telephone Encounter (Signed)
New message     Order have expired for [name]---need more orders.  She has only seen her once this week.

## 2013-04-29 NOTE — Telephone Encounter (Signed)
pt homehealth nurse Thayer Ohm.Given verbal authorization to increase visits at pt home per Dr.Smith.

## 2013-05-14 ENCOUNTER — Telehealth: Payer: Self-pay | Admitting: Interventional Cardiology

## 2013-05-14 NOTE — Telephone Encounter (Signed)
New problem   Pt is complaining of fatigue/sob and abdominal pain. Please advise or call pt.

## 2013-05-14 NOTE — Telephone Encounter (Signed)
returned call to pt daughter.pt daughter sts that pt is doing well.she spoke eith her today and she had no complaints.adv her I would recheck on pt tomorrow pt daughter verbslied understanding

## 2013-05-15 NOTE — Telephone Encounter (Signed)
called pt this morning pt sts that she is doing well.no swelling and no more sob than uasually.pt  stst that a new homehealth nurse came to her home yesterday her normal nurse was sick.the nurse yesterday was not familiar with her.pt weight is at goal and urine output is normal per her daughter [name].

## 2013-05-17 ENCOUNTER — Telehealth: Payer: Self-pay | Admitting: Family Medicine

## 2013-05-17 NOTE — Telephone Encounter (Signed)
Heather with home health called and wanted to give you a update on [name]. She states that pt has had a weight increase in one day of three pounds. She states yesterday she weighed 140.2 and today she weighs 143.2.  bp 150/80 heart rate 78 respiratory 18 oxygen 95 at 3 liters. She states pt's lungs are clear and has no shortness of breath. She states that pt seemed very anxious about her caregiver leaving which took place while she was there. She states they will be back on Monday.

## 2013-05-21 ENCOUNTER — Encounter: Payer: Self-pay | Admitting: *Deleted

## 2013-05-30 ENCOUNTER — Emergency Department (HOSPITAL_COMMUNITY)
Admission: EM | Admit: 2013-05-30 | Discharge: 2013-05-31 | Disposition: A | Discharge status: Home / Self Care | Payer: MEDICARE | Attending: Emergency Medicine | Admitting: Emergency Medicine

## 2013-05-30 ENCOUNTER — Other Ambulatory Visit: Payer: Self-pay

## 2013-05-30 ENCOUNTER — Emergency Department (HOSPITAL_COMMUNITY): Payer: MEDICARE

## 2013-05-30 ENCOUNTER — Telehealth: Payer: Self-pay | Admitting: Family Medicine

## 2013-05-30 ENCOUNTER — Encounter (HOSPITAL_COMMUNITY): Payer: Self-pay | Admitting: Emergency Medicine

## 2013-05-30 ENCOUNTER — Telehealth: Payer: Self-pay | Admitting: Interventional Cardiology

## 2013-05-30 DIAGNOSIS — Z9981 Dependence on supplemental oxygen: Secondary | ICD-10-CM | POA: Insufficient documentation

## 2013-05-30 DIAGNOSIS — Z792 Long term (current) use of antibiotics: Secondary | ICD-10-CM | POA: Insufficient documentation

## 2013-05-30 DIAGNOSIS — I5030 Unspecified diastolic (congestive) heart failure: Secondary | ICD-10-CM | POA: Insufficient documentation

## 2013-05-30 DIAGNOSIS — I495 Sick sinus syndrome: Secondary | ICD-10-CM | POA: Insufficient documentation

## 2013-05-30 DIAGNOSIS — Z79899 Other long term (current) drug therapy: Secondary | ICD-10-CM | POA: Insufficient documentation

## 2013-05-30 DIAGNOSIS — I509 Heart failure, unspecified: Secondary | ICD-10-CM

## 2013-05-30 DIAGNOSIS — Z950 Presence of cardiac pacemaker: Secondary | ICD-10-CM | POA: Insufficient documentation

## 2013-05-30 DIAGNOSIS — F411 Generalized anxiety disorder: Secondary | ICD-10-CM | POA: Insufficient documentation

## 2013-05-30 DIAGNOSIS — Z7982 Long term (current) use of aspirin: Secondary | ICD-10-CM | POA: Insufficient documentation

## 2013-05-30 DIAGNOSIS — I129 Hypertensive chronic kidney disease with stage 1 through stage 4 chronic kidney disease, or unspecified chronic kidney disease: Secondary | ICD-10-CM | POA: Insufficient documentation

## 2013-05-30 DIAGNOSIS — N189 Chronic kidney disease, unspecified: Secondary | ICD-10-CM | POA: Insufficient documentation

## 2013-05-30 DIAGNOSIS — M129 Arthropathy, unspecified: Secondary | ICD-10-CM | POA: Insufficient documentation

## 2013-05-30 DIAGNOSIS — Z880 Allergy status to penicillin: Secondary | ICD-10-CM | POA: Insufficient documentation

## 2013-05-30 DIAGNOSIS — E039 Hypothyroidism, unspecified: Secondary | ICD-10-CM | POA: Insufficient documentation

## 2013-05-30 DIAGNOSIS — I4891 Unspecified atrial fibrillation: Secondary | ICD-10-CM | POA: Insufficient documentation

## 2013-05-30 LAB — CBC WITH DIFFERENTIAL/PLATELET
BASOS ABS: 0 10*3/uL (ref 0.0–0.1)
Basophils Relative: 0 % (ref 0–1)
EOS ABS: 0.3 10*3/uL (ref 0.0–0.7)
EOS PCT: 3 % (ref 0–5)
HCT: 38 % (ref 36.0–46.0)
Hemoglobin: 12.5 g/dL (ref 12.0–15.0)
LYMPHS PCT: 16 % (ref 12–46)
Lymphs Abs: 1.4 10*3/uL (ref 0.7–4.0)
MCH: 31.4 pg (ref 26.0–34.0)
MCHC: 32.9 g/dL (ref 30.0–36.0)
MCV: 95.5 fL (ref 78.0–100.0)
Monocytes Absolute: 0.9 10*3/uL (ref 0.1–1.0)
Monocytes Relative: 11 % (ref 3–12)
NEUTROS PCT: 69 % (ref 43–77)
Neutro Abs: 5.8 10*3/uL (ref 1.7–7.7)
Platelets: 245 10*3/uL (ref 150–400)
RBC: 3.98 MIL/uL (ref 3.87–5.11)
RDW: 16.5 % — AB (ref 11.5–15.5)
WBC: 8.3 10*3/uL (ref 4.0–10.5)

## 2013-05-30 LAB — COMPREHENSIVE METABOLIC PANEL
ALK PHOS: 94 U/L (ref 39–117)
ALT: 23 U/L (ref 0–35)
AST: 29 U/L (ref 0–37)
Albumin: 3.5 g/dL (ref 3.5–5.2)
BUN: 36 mg/dL — ABNORMAL HIGH (ref 6–23)
CO2: 23 mEq/L (ref 19–32)
Calcium: 8.8 mg/dL (ref 8.4–10.5)
Chloride: 100 mEq/L (ref 96–112)
Creatinine, Ser: 1.73 mg/dL — ABNORMAL HIGH (ref 0.50–1.10)
GFR calc Af Amer: 29 mL/min — ABNORMAL LOW (ref >90)
GFR calc non Af Amer: 25 mL/min — ABNORMAL LOW (ref >90)
Glucose, Bld: 112 mg/dL — ABNORMAL HIGH (ref 70–99)
POTASSIUM: 3.8 meq/L (ref 3.7–5.3)
SODIUM: 139 meq/L (ref 137–147)
TOTAL PROTEIN: 6.9 g/dL (ref 6.0–8.3)
Total Bilirubin: 0.3 mg/dL (ref 0.3–1.2)

## 2013-05-30 LAB — POCT I-STAT TROPONIN I: Troponin i, poc: 0.01 ng/mL (ref 0.00–0.08)

## 2013-05-30 LAB — PRO B NATRIURETIC PEPTIDE: Pro B Natriuretic peptide (BNP): 1894 pg/mL — ABNORMAL HIGH (ref 0–450)

## 2013-05-30 NOTE — ED Provider Notes (Signed)
CSN: [id number]     Arrival date & time 05/30/13  2129 History   First MD Initiated Contact with Patient 05/30/13 2311     Chief Complaint  Patient presents with  . Congestive Heart Failure   (Consider location/radiation/quality/duration/timing/severity/associated sxs/prior Treatment) HPI This patient is a 80 year old woman brought to the emergency department by her daughter. The patient has grade 2 diastolic heart failure along with atrial fibrillation. She uses home oxygen therapy when necessary during the day and 2 L at night.  Her daughter and home health nurse are very concerned because the patient has had a 5 pound weight gain over the past week. Her urine output has been less than normal despite doubling in her dose of Lasix. This was recommended by the patient's home health care nurse. She is now taking 160 mg twice a day.  The patient is without complaints of shortness of breath. She denies any change in baseline dyspnea on exertion. She has not had chest pain. No cough or fever. No other changes to medications. The patient does not follow a low-sodium diet.    Past Medical History  Diagnosis Date  . Hypertension   . Osteoporosis   . Atrial fib/flutter, transient   . CHF (congestive heart failure)   . Vertigo   . Chronic renal insufficiency   . Hypothyroidism   . Arthritis   . Anxiety   . On home oxygen therapy     uses O2 @ 2l/m nasally bedtime  . Tachy-brady syndrome      treated with amiodarone and permanent DDD pacemaker, 1993   Past Surgical History  Procedure Laterality Date  . Knee arthroscopy      right  . Insert / replace / remove pacemaker    . Total hip arthroplasty      right  . Esophagogastroduodenoscopy N/A 11/26/2012    Procedure: ESOPHAGOGASTRODUODENOSCOPY (EGD);  Surgeon: Charna Elizabeth, MD;  Location: [facility];  Service: Endoscopy;  Laterality: N/A;  . Joint replacement      rt hip  . Shoulder surgery    . Esophagogastroduodenoscopy N/A  03/08/2013    Procedure: ESOPHAGOGASTRODUODENOSCOPY (EGD);  Surgeon: Theda Belfast, MD;  Location: [facility];  Service: Endoscopy;  Laterality: N/A;  . Pacemaker insertion     Family History  Problem Relation Age of Onset  . Hypertension Mother   . Heart disease Mother     Heart failure (vague history)   History  Substance Use Topics  . Smoking status: Never Smoker   . Smokeless tobacco: Never Used  . Alcohol Use: No   OB History   Grav Para Term Preterm Abortions TAB SAB Ect Mult Living                 Review of Systems Ten point review of symptoms performed and is negative with the exception of symptoms noted above.   Allergies  Quinidine; Tiazac; Warfarin sodium; Percocet; Promethazine hcl; Amiodarone; Procainamide; Septra; Amoxicillin; Ciprofloxacin; and Polocaine  Home Medications   Current Outpatient Rx  Name  Route  Sig  Dispense  Refill  . acetaminophen (TYLENOL ARTHRITIS PAIN) 650 MG CR tablet   Oral   Take 650 mg by mouth every 8 (eight) hours as needed for pain.         Marland Kitchen amLODipine (NORVASC) 5 MG tablet   Oral   Take 5 mg by mouth daily.         Marland Kitchen aspirin 81 MG tablet   Oral  Take 81 mg by mouth daily.         Marland Kitchen. atenolol (TENORMIN) 25 MG tablet   Oral   Take 1 tablet (25 mg total) by mouth 2 (two) times daily.   90 tablet   3   . buPROPion (WELLBUTRIN XL) 150 MG 24 hr tablet   Oral   Take 300 mg by mouth daily.         . chlorpheniramine-HYDROcodone (TUSSIONEX PENNKINETIC ER) 10-8 MG/5ML LQCR   Oral   Take 5 mLs by mouth every 12 (twelve) hours as needed for cough (cough).   120 mL   0   . Cholecalciferol (VITAMIN D) 2000 UNITS tablet   Oral   Take 2,000 Units by mouth daily.         . Cranberry 450 MG TABS   Oral   Take 1 tablet by mouth daily.         . Cyanocobalamin (VITAMIN B-12 PO)   Oral   Take 1,000 mcg by mouth daily.          . digoxin (LANOXIN) 0.125 MG tablet      Pt to take 1/2 tablet once a day.          . estradiol (ESTRACE) 0.1 MG/GM vaginal cream   Vaginal   Place 2 g vaginally at bedtime.          . furosemide (LASIX) 80 MG tablet   Oral   Take 2 tablets (160 mg total) by mouth 2 (two) times daily.         . hyoscyamine (LEVSIN/SL) 0.125 MG SL tablet   Sublingual   Place 1 tablet (0.125 mg total) under the tongue every 4 (four) hours as needed for cramping.   15 tablet   0   . lactose free nutrition (BOOST PLUS) LIQD   Oral   Take 237 mLs by mouth 2 (two) times daily between meals.          Marland Kitchen. levothyroxine (SYNTHROID, LEVOTHROID) 88 MCG tablet   Oral   Take 1 tablet (88 mcg total) by mouth daily before breakfast.   90 tablet   0   . loratadine (CLARITIN) 10 MG tablet   Oral   Take 10 mg by mouth as needed for allergies.         . magnesium hydroxide (MILK OF MAGNESIA) 400 MG/5ML suspension   Oral   Take 30 mLs by mouth daily as needed for constipation.         . Multiple Vitamins-Minerals (ICAPS PO)   Oral   Take 1 capsule by mouth daily.         . NON FORMULARY   Oral   Take 1 tablet by mouth daily. Ultra Flora IB once a day         . omeprazole (PRILOSEC) 20 MG capsule   Oral   Take 40 mg by mouth every morning.         . potassium chloride (K-DUR,KLOR-CON) 10 MEQ tablet   Oral   Take 1 tablet (10 mEq total) by mouth daily.   90 tablet   3   . traMADol (ULTRAM) 50 MG tablet   Oral   Take 50 mg by mouth at bedtime as needed for pain.         Marland Kitchen. trimethoprim (TRIMPEX) 100 MG tablet   Oral   Take 100 mg by mouth daily.          BP 127/88  Pulse 82  Temp(Src) 97.7 F (36.5 C) (Oral)  Resp 26  Ht 5\' 5"  (1.651 m)  Wt 149 lb 1.6 oz (67.631 kg)  BMI 24.81 kg/m2  SpO2 98% Physical Exam Gen: well developed and well nourished appearing Head: NCAT Eyes: PERL, EOMI Nose: no epistaixis or rhinorrhea Mouth/throat: mucosa is moist and pink Neck: supple, no stridor Lungs: Respiratory 20-24 per minute, CTA B, no wheezing,  rhonchi or rales CV: Irregularly irregular, systolic murmur, extremities appear well perfused.  Abd: soft, notender, nondistended Back: Marked kyphosis, no ttp, no cva ttp Skin: warm and dry Ext: normal to inspection, no dependent edema Neuro: CN ii-xii grossly intact, no focal deficits Psyche; normal affect,  calm and cooperative.   ED Course  Procedures (including critical care time) Labs Review  Results for orders placed during the hospital encounter of 05/30/13 (from the past 24 hour(s))  CBC WITH DIFFERENTIAL     Status: Abnormal   Collection Time    05/30/13 10:10 PM      Result Value Range   WBC 8.3  4.0 - 10.5 K/uL   RBC 3.98  3.87 - 5.11 MIL/uL   Hemoglobin 12.5  12.0 - 15.0 g/dL   HCT 29.5  62.1 - 30.8 %   MCV 95.5  78.0 - 100.0 fL   MCH 31.4  26.0 - 34.0 pg   MCHC 32.9  30.0 - 36.0 g/dL   RDW 65.7 (*) 84.6 - 96.2 %   Platelets 245  150 - 400 K/uL   Neutrophils Relative % 69  43 - 77 %   Neutro Abs 5.8  1.7 - 7.7 K/uL   Lymphocytes Relative 16  12 - 46 %   Lymphs Abs 1.4  0.7 - 4.0 K/uL   Monocytes Relative 11  3 - 12 %   Monocytes Absolute 0.9  0.1 - 1.0 K/uL   Eosinophils Relative 3  0 - 5 %   Eosinophils Absolute 0.3  0.0 - 0.7 K/uL   Basophils Relative 0  0 - 1 %   Basophils Absolute 0.0  0.0 - 0.1 K/uL  COMPREHENSIVE METABOLIC PANEL     Status: Abnormal   Collection Time    05/30/13 10:10 PM      Result Value Range   Sodium 139  137 - 147 mEq/L   Potassium 3.8  3.7 - 5.3 mEq/L   Chloride 100  96 - 112 mEq/L   CO2 23  19 - 32 mEq/L   Glucose, Bld 112 (*) 70 - 99 mg/dL   BUN 36 (*) 6 - 23 mg/dL   Creatinine, Ser 9.52 (*) 0.50 - 1.10 mg/dL   Calcium 8.8  8.4 - 84.1 mg/dL   Total Protein 6.9  6.0 - 8.3 g/dL   Albumin 3.5  3.5 - 5.2 g/dL   AST 29  0 - 37 U/L   ALT 23  0 - 35 U/L   Alkaline Phosphatase 94  39 - 117 U/L   Total Bilirubin 0.3  0.3 - 1.2 mg/dL   GFR calc non Af Amer 25 (*) >90 mL/min   GFR calc Af Amer 29 (*) >90 mL/min  PRO B  NATRIURETIC PEPTIDE     Status: Abnormal   Collection Time    05/30/13 10:10 PM      Result Value Range   Pro B Natriuretic peptide (BNP) 1894.0 (*) 0 - 450 pg/mL  POCT I-STAT TROPONIN I     Status: None   Collection Time  05/30/13 10:23 PM      Result Value Range   Troponin i, poc 0.01  0.00 - 0.08 ng/mL   Comment 3            Imaging Review Dg Chest 2 View  05/30/2013   CLINICAL DATA:  Shortness of breath.  EXAM: CHEST  2 VIEW  COMPARISON:  CT CHEST W/O CM dated 04/01/2013; DG CHEST 1V PORT dated 03/31/2013  FINDINGS: The lungs are well-aerated. Mild vascular congestion is noted. There is no evidence of focal opacification, pleural effusion or pneumothorax.  The heart is borderline enlarged. A pacemaker is noted at the left chest wall, with leads ending at the right atrium and right ventricle. No acute osseous abnormalities are seen. Left humeral hardware is grossly unremarkable in appearance. Clips are noted within the right upper quadrant, reflecting prior cholecystectomy.  IMPRESSION: Mild vascular congestion and borderline cardiomegaly ; lungs remain grossly clear.   Electronically Signed   By: Roanna Raider M.D.   On: 05/30/2013 23:19    EKG: Atrial fibrillation, normal axis, normal qrs complex, no acute ischemic changes, normal ST  T segments.   MDM  The patient is brought to the emergency department because family is concerned mainly for the patient's weight gain and diminished urine output. However, the patient has no respiratory complaints. Her lungs are clear to auscultation, her sats are 99% at baseline 2 L of oxygen, chest x-ray did not show any pulmonary edema, Her BNP level is the lowest that it has been several months. Renal function is at baseline.  I believe that the patient is safe for discharge. Admission is not indicated at this time. I have recommended that she continue her current dose of Lasix. The daughter will call Dr. Michaelle Copas office in the morning to arrange  followup either tomorrow or Monday. Return precautions discussed.    Brandt Loosen, MD 05/31/13 [phone]

## 2013-05-30 NOTE — ED Notes (Addendum)
Pts family states that she has only urinated 150 ml total. States that she took 160 of lasix this morning and 240 this afternoon per MD order. Family states that home health nurse reported pt had crackles in her lungs. Family also states that pt has been short of breath. States that pt has been using 3L [place] for the past month. Home health aid reports that pt was SOB [place] [employer] without oxygen on.

## 2013-05-30 NOTE — ED Notes (Addendum)
Per home health nurse: pt has been gaining weight all week. Pt is now taking 160 mg Lasix twice a day, pt intake for today 1450 mL, output 275 mL, pt c/o shortness of breath, chest pain, pt on 4 lpm O2. Pt in chronic afib. Pt is A&Ox4, respirations equal and unlabored on 4 liters of O2, skin warm and dry. [facility] recommended pt come to ED. Per pt: pt denies pain at this time.

## 2013-05-30 NOTE — ED Notes (Signed)
Pt 96% on 3L when standing at side of bed.

## 2013-05-30 NOTE — Telephone Encounter (Signed)
Pt's care provider called and stating that [name] parking placard has expired. They would like a new one completed. I am sending one back in you folder to be completed. Please call [phone] when complete.

## 2013-05-30 NOTE — Telephone Encounter (Signed)
New message  [facility] and stated patient has only put out 75cc of urine in the last 8 hours. If patient needs meds she needs the orders before 5pm before pharmacy closes, because there is no one to deliver tonight. Thayer OhmChris w/[facility] has not been to patients home since 05/28/13. She is on the way now. Please call and advise.

## 2013-05-30 NOTE — Discharge Instructions (Signed)
Heart Failure Heart failure means your heart has trouble pumping blood. This makes it hard for your body to work well. Heart failure is usually a long-term (chronic) condition. You must take good care of yourself and follow your doctor's treatment plan. HOME CARE  Take your heart medicine as told by your doctor.  Do not stop taking medicine unless your doctor tells you to.  Do not skip any dose of medicine.  Refill your medicines before they run out.  Take other medicines only as told by your doctor or pharmacist.  Stay active if told by your doctor. The elderly and people with severe heart failure should talk with a doctor about physical activity.  Eat heart healthy foods. Choose foods that are without trans fat and are low in saturated fat, cholesterol, and salt (sodium). This includes fresh or frozen fruits and vegetables, fish, lean meats, fat-free or low-fat dairy foods, whole grains, and high-fiber foods. Lentils and dried peas and beans (legumes) are also good choices.  Limit salt if told by your doctor.  Cook in a healthy way. Roast, grill, broil, bake, poach, steam, or stir-fry foods.  Limit fluids as told by your doctor.  Weigh yourself every morning. Do this after you pee (urinate) and before you eat breakfast. Write down your weight to give to your doctor.  Take your blood pressure and write it down if your doctor tell you to.  Ask your doctor how to check your pulse. Check your pulse as told.  Lose weight if told by your doctor.  Stop smoking or chewing tobacco. Do not use gum or patches that help you quit without your doctor's approval.  Schedule and go to doctor visits as told.  Nonpregnant women should have no more than 1 drink a day. Men should have no more than 2 drinks a day. Talk to your doctor about drinking alcohol.  Stop illegal drug use.  Stay current with shots (immunizations).  Manage your health conditions as told by your doctor.  Learn to manage  your stress.  Rest when you are tired.  If it is really hot outside:  Avoid intense activities.  Use air conditioning or fans, or get in a cooler place.  Avoid caffeine and alcohol.  Wear loose-fitting, lightweight, and light-colored clothing.  If it is really cold outside:  Avoid intense activities.  Layer your clothing.  Wear mittens or gloves, a hat, and a scarf when going outside.  Avoid alcohol.  Learn about heart failure and get support as needed.  Get help to maintain or improve your quality of life and your ability to care for yourself as needed. GET HELP IF:   You gain 03 lb/1.4 kg or more in 1 day or 05 lb/2.3 kg in a week.  You are more short of breath than usual.  You cannot do your normal activities.  You tire easily.  You cough more than normal, especially with activity.  You have any or more puffiness (swelling) in areas such as your hands, feet, ankles, or belly (abdomen).  You cannot sleep because it is hard to breathe.  You feel like your heart is beating fast (palpitations).  You get dizzy or lightheaded when you stand up. GET HELP RIGHT AWAY IF:   You have trouble breathing.  There is a change in mental status, such as becoming less alert or not being able to focus.  You have chest pain or discomfort.  You faint. MAKE SURE YOU:   Understand these  instructions.  Will watch your condition.  Will get help right away if you are not doing well or get worse. Document Released: 02/02/2008 Document Revised: 08/20/2012 Document Reviewed: 11/24/2011 [facility] Patient Information 2014 [employer] [place].   CONTINUE CURRENT DOSE OF LASIX AND FOLLOW UP WITH DR. Katrinka BlazingSMITH EITHER TOMORROW OR Monday.  RETURN TO THE ED IF YOU HAVE WORSENING SYMPTOMS OR ANY URGENT HEALTH CONCERNS.

## 2013-05-30 NOTE — Telephone Encounter (Signed)
[facility] RN, Ramiro HarvestChris Kennedy, reports: Patient has been retaining fluid past couple of days. UOP over past 24 hours = 1000cc/over past 8 hrs only 75cc. Weight up to 145#. Some SOB, crackles to bases, legs swollen mild-moderately, current BP 110/60, HR WNL, Girth 104cm, patient states she feels her "belly is tight". Patient has been receiving Lasix 160 mg by mouth twice daily for past couple of days with no decrease in weight. Dr. Katrinka BlazingSmith reviewed and advises the following: Patient to increase Lasix for tonight's dose only to 240 mg by mouth. Resume previous Lasix dose schedule tomorrow morning 05/31/13, as previously ordered. Dr. Katrinka BlazingSmith [facility] back tomorrow with update on patient's status and UOP/SOB assessment.  Information provided to Ramiro Harvesthris Kennedy, RN. She verbalized understanding and agreement with order/requests.  She states she sees patient tomorrow at 2:00 pm and will call Dr. Katrinka BlazingSmith at that time with an update.

## 2013-05-31 ENCOUNTER — Telehealth: Payer: Self-pay | Admitting: Internal Medicine

## 2013-05-31 ENCOUNTER — Telehealth: Payer: Self-pay | Admitting: Interventional Cardiology

## 2013-05-31 ENCOUNTER — Ambulatory Visit: Payer: MEDICARE | Admitting: Medical

## 2013-05-31 MED ORDER — FUROSEMIDE 80 MG PO TABS: 240.0000 mg | ORAL_TABLET | Freq: Every day | ORAL | Status: DC

## 2013-05-31 MED ORDER — METOLAZONE 2.5 MG PO TABS: ORAL_TABLET | ORAL | Status: DC

## 2013-05-31 NOTE — Telephone Encounter (Signed)
Per Pt's care provider this is an on going situation. Dr. Susann GivensLalonde has given them medication for this issues before and she took that and is feeling better. So per daughter's instructions appt was cancelled.

## 2013-05-31 NOTE — Telephone Encounter (Signed)
Is she going to the ED?  I see she was on schedule but not now

## 2013-05-31 NOTE — Telephone Encounter (Signed)
returned call to pt [facility] nurse Christy.she sts that pt weight is done 2lbs this a.m..Christy given Dr.Smith instructions pt to take Lasix 240mg  daily, pt to start metolazine  2.5mg  daily 1 hour before lasix dosage.pt to stop metaoazone once she is back to her baseline weight.she sts that pt c/o acute abdominal pain, has had a bowel movement, and would like to know if she needs fluid restriction.adv her that I would fwd question to Dr.Smith.she verbalized understanding

## 2013-05-31 NOTE — Telephone Encounter (Signed)
[facility] called stating that pt had bowel movement this morning and then after that she started having severe abdominal pain. The nurse can not hear any bowel sounds but can hear some rumbling. Pt is coming in to see you this afternoon @ 1.45pm

## 2013-05-31 NOTE — Telephone Encounter (Signed)
New message     Pt went to ER last night---talk to nurse about what to do from here---they did not keep her

## 2013-06-02 NOTE — Telephone Encounter (Signed)
No fluid restriction. Lets see how we do with metolazone and her current pattern of intake. Don't want to change too many things at once.

## 2013-06-03 ENCOUNTER — Telehealth: Payer: Self-pay | Admitting: Family Medicine

## 2013-06-03 NOTE — Telephone Encounter (Signed)
[facility] called and wanted you to know that [name] fell at her home yesterday. She states that she fell between toilet and bathtub. She states that [name] is bruised up pretty bad. She does states that [name]'s lungs appear to be clear. This is just an [place].

## 2013-06-04 NOTE — Telephone Encounter (Signed)
called to check on pt.[name] Pt aide sts that pt weight is down to 141lb.

## 2013-06-05 ENCOUNTER — Telehealth: Payer: Self-pay | Admitting: Interventional Cardiology

## 2013-06-05 NOTE — Telephone Encounter (Signed)
New message    Need re-certification orders for patient.

## 2013-06-05 NOTE — Telephone Encounter (Signed)
Okay 

## 2013-06-05 NOTE — Telephone Encounter (Signed)
Called [facility]/Christine back and left message regarding Dr. Katrinka BlazingSmith is out of the office today so request for recertification will be forwarded to him upon his return to office tomorrow, 06/06/13. Routed to Dr. Zada GirtSmith/Lisa Parris-God[place].

## 2013-06-06 ENCOUNTER — Ambulatory Visit (INDEPENDENT_AMBULATORY_CARE_PROVIDER_SITE_OTHER): Payer: MEDICARE | Admitting: Family Medicine

## 2013-06-06 ENCOUNTER — Encounter: Payer: Self-pay | Admitting: Family Medicine

## 2013-06-06 VITALS — BP 112/70 | HR 78 | Wt 139.0 lb

## 2013-06-06 DIAGNOSIS — R079 Chest pain, unspecified: Secondary | ICD-10-CM

## 2013-06-06 DIAGNOSIS — I509 Heart failure, unspecified: Secondary | ICD-10-CM

## 2013-06-06 DIAGNOSIS — R0781 Pleurodynia: Secondary | ICD-10-CM

## 2013-06-06 LAB — COMPREHENSIVE METABOLIC PANEL
ALT: 17 U/L (ref 0–35)
AST: 24 U/L (ref 0–37)
Albumin: 4.3 g/dL (ref 3.5–5.2)
Alkaline Phosphatase: 85 U/L (ref 39–117)
BUN: 57 mg/dL — ABNORMAL HIGH (ref 6–23)
CALCIUM: 10.1 mg/dL (ref 8.4–10.5)
CO2: 30 meq/L (ref 19–32)
Chloride: 86 mEq/L — ABNORMAL LOW (ref 96–112)
Creat: 2.57 mg/dL — ABNORMAL HIGH (ref 0.50–1.10)
GLUCOSE: 115 mg/dL — AB (ref 70–99)
Potassium: 3 mEq/L — ABNORMAL LOW (ref 3.5–5.3)
SODIUM: 132 meq/L — AB (ref 135–145)
TOTAL PROTEIN: 7.2 g/dL (ref 6.0–8.3)
Total Bilirubin: 0.5 mg/dL (ref 0.2–1.2)

## 2013-06-06 NOTE — Patient Instructions (Signed)
Make sure she takes a deep breath every so often and use a pillow

## 2013-06-06 NOTE — Progress Notes (Signed)
   Subjective:    Patient ID: [name], female    DOB: 08/22/1919, 80 y.o.   MRN: [id number]  HPI She is here for evaluation after 3 falls. The first one was noted in the emergency room on January 22. Since then she has fallen twice again. Most recently was 2 days ago sustaining an injury to her left lateral rib area as well as her sacral area. She continues on multiple diuretics. Her weight in the last several days is been in the 139 pound range. But pressure and pulse were recently taken which looks relatively good. He does not complaining of chest pain, abdominal pain. She now has caregivers around-the-clock. Home health nurses scheduled to come by tomorrow to report back to cardiology.   Review of Systems     Objective:   Physical Exam Alert and complaining of left rib pain. Exam of the left rib area does show contusion. Compression of the ribs does cause pain laterally. Abdominal exam shows no masses or tenderness. Lungs show decreased breath sounds but adequate ventilation was noted. ER record was reviewed.      Assessment & Plan:  CHF, acute on chronic - Plan: Comprehensive metabolic panel  Rib pain on left side  discussed possible rib fracture and since she is done release 48 hours with no evidence of pneumothorax or evidence of splenic involvement, no x-rays were ordered. They do have tramadol home and the daughter will administer appropriately.

## 2013-06-07 ENCOUNTER — Telehealth: Payer: Self-pay | Admitting: Interventional Cardiology

## 2013-06-07 ENCOUNTER — Telehealth: Payer: Self-pay | Admitting: *Deleted

## 2013-06-07 ENCOUNTER — Telehealth: Payer: Self-pay | Admitting: Family Medicine

## 2013-06-07 NOTE — Telephone Encounter (Signed)
New Problem:  [name] states last week her mom's diuretics were changed. She states she believes her mom has lost too much fluid. The pt is weak and dizzy. Her BP is 100/52. Also, she has a home health nurse that will be there this afternoon. [name] is asking if Dr. Katrinka BlazingSmith would like the nurse to give the patient some IV fluid.   [phone] Home Health Nurse - Ramiro Harvesthris Kennedy

## 2013-06-07 NOTE — Telephone Encounter (Signed)
Go ahead and set this up 

## 2013-06-07 NOTE — Telephone Encounter (Signed)
HHN Ramiro Harvest([name] Kennedy) w/[facility] calling stating [name] has had several falls over the past week, is orthostatic w/BP 80/50; has last about 8 lbs.  Was seen by Dr. Susann GivensLalonde yest because of the fall.  Lab work drawn and creat was 2.5. The family stopped her Metolazone this am and had reduced her Lasix to 80 mg a day. Thayer OhmChris states that Dr. Katrinka BlazingSmith was trying to keep her out of the hospital and wanted to know if could start IV at home to replace her fluid.  Spoke w/Dr. Katrinka BlazingSmith who advises to stop Metolazone;stop Lasix; stay on Kdur but take BID x 3 days; Bmet on Monday; weight q day; call if BP drops more or has any changes needs to call DOD on call this weekend.  Also may start IV of 1/2 NS at 75 cc/hr. Also needs to call Monday to give report of how she is doing. [name] verbalizes understanding and will follow instructions

## 2013-06-10 ENCOUNTER — Telehealth: Payer: Self-pay

## 2013-06-10 NOTE — Telephone Encounter (Signed)
pt daughter called in update on opt.over the weekend pt weight went down to 138lb.pt weight today 140lb.pt dtr held amlodipine through the weekend pt bp was 146/72.amlodipine restarted today.pt had a urine output of 1000cc over the weekend without diuretic.pt was able to get the 500cc of iv fluid on Friday. Pt [facility] nurse called to give bp today 100/60 sitting 90/60 standing.

## 2013-06-10 NOTE — Telephone Encounter (Signed)
lmom.pt is to resume lasix tomorrow 06/11/13 120mg  daily.pt is to weigh daily. they are to call the office if pt weight gets up to 145lb.

## 2013-06-11 ENCOUNTER — Telehealth: Payer: Self-pay | Admitting: Internal Medicine

## 2013-06-11 MED ORDER — HYDROCODONE-ACETAMINOPHEN 2.5-325 MG PO TABS: 1.0000 | ORAL_TABLET | Freq: Four times a day (QID) | ORAL | Status: DC | PRN

## 2013-06-11 MED ORDER — TRAMADOL HCL 50 MG PO TABS: 50.0000 mg | ORAL_TABLET | Freq: Every evening | ORAL | Status: DC | PRN

## 2013-06-11 NOTE — Telephone Encounter (Signed)
How often do you want pt to do physical therapy.

## 2013-06-11 NOTE — Telephone Encounter (Signed)
Dawn called and states that pt is still in a lot of pain so she is running out of the tramadol that they had. Dawn wants to know if you can call in tramadol 50mg  for her. She is also taking some hydrocodone the lowest dose which is expired and would like a refill on that if possible. Dawn wanted to let you know that she does have around of the clock care for her. Send tramadol [facility] and dawn can come pick up the hydrocodone if you will fill that for pt

## 2013-06-11 NOTE — Telephone Encounter (Signed)
I would assume that they have a protocol as to how often. I would guess 3 times per week

## 2013-06-11 NOTE — Telephone Encounter (Signed)
Faxed order to [facility]

## 2013-06-11 NOTE — Telephone Encounter (Signed)
Med was not printed and needs printed so pt daughter can come pick up

## 2013-06-12 ENCOUNTER — Other Ambulatory Visit: Payer: Self-pay | Admitting: Family Medicine

## 2013-06-12 NOTE — Telephone Encounter (Signed)
Is this ok to refill?  

## 2013-06-13 MED ORDER — LEVOTHYROXINE SODIUM 88 MCG PO TABS: 88.0000 ug | ORAL_TABLET | Freq: Every day | ORAL | Status: DC

## 2013-06-13 NOTE — Telephone Encounter (Signed)
Pt is not on the other 2 meds per dawn

## 2013-06-13 NOTE — Telephone Encounter (Signed)
Left message for dawn to call me back

## 2013-06-13 NOTE — Telephone Encounter (Signed)
Renew the thyroid medication to check with her daughter concerning the other 2 because I don't think we need to renew them

## 2013-06-13 NOTE — Addendum Note (Signed)
Addended by: Barbette OrLOWE, SABRINA A on: 06/13/2013 01:33 PM   Modules accepted: Orders

## 2013-06-17 NOTE — Telephone Encounter (Signed)
Follow up    Pt wt is 144.4.  Legs are swelling and pt is coughing

## 2013-06-17 NOTE — Telephone Encounter (Signed)
returned pt dtr call.Dawn st that pt weight us upto 144.4lb today.pt had lasix 120mg  yesterday, a urine output of 1450cc., but still gained 1lb today.pt has had 160mg  this am.Dawn given Dr.Smith instructions 2.5mg  metalazone this evening 30min later 120mg  of lasix.no diuretic to be given tomorrow am until update of pt weight and urine output is given to Dr.Smith she will call in the am with an update.pt dtr agreeable with plan and verbalized understanding

## 2013-06-18 ENCOUNTER — Telehealth: Payer: Self-pay | Admitting: Interventional Cardiology

## 2013-06-18 NOTE — Telephone Encounter (Signed)
New message    Pt's bp is 80/60 and pt is lightheaded and dizzy.  She has been dizzy since Sunday.  Lost 3lbs of wt since yesterday

## 2013-06-18 NOTE — Telephone Encounter (Signed)
Follow up     Daughter calling    Weight down  141.2  Today . 1400 cc urine output .    Weight on yesterday  144.4

## 2013-06-18 NOTE — Telephone Encounter (Signed)
returned call to pt homehealth nurse Thayer Ohmhris. she was given Dr.Smith instructions. Hold diuretic and Bp meds today, encourage fluid intake.call tomorrow with and update of weight and bp.Thayer OhmChris verbalized understanding.

## 2013-06-18 NOTE — Telephone Encounter (Signed)
returned pt call spoke with pt caregiver.she was given Dr.Smith resume baseline dose of lasix 120 mg daily.pt to weigh daily call if pt weight is up to 144-145lb.she verbalized understanding.

## 2013-06-19 ENCOUNTER — Ambulatory Visit
Admission: RE | Admit: 2013-06-19 | Discharge: 2013-06-19 | Disposition: A | Discharge status: Home / Self Care | Payer: MEDICARE | Source: Ambulatory Visit | Attending: Family Medicine | Admitting: Family Medicine

## 2013-06-19 ENCOUNTER — Telehealth: Payer: Self-pay | Admitting: Interventional Cardiology

## 2013-06-19 ENCOUNTER — Telehealth: Payer: Self-pay | Admitting: Family Medicine

## 2013-06-19 ENCOUNTER — Ambulatory Visit: Payer: Self-pay

## 2013-06-19 ENCOUNTER — Encounter: Payer: Self-pay | Admitting: Medical

## 2013-06-19 ENCOUNTER — Other Ambulatory Visit: Payer: Self-pay | Admitting: Medical

## 2013-06-19 ENCOUNTER — Ambulatory Visit (INDEPENDENT_AMBULATORY_CARE_PROVIDER_SITE_OTHER): Payer: MEDICARE | Admitting: Medical

## 2013-06-19 VITALS — BP 120/60 | HR 68 | Temp 98.6°F | Resp 18 | Wt 144.0 lb

## 2013-06-19 DIAGNOSIS — R0602 Shortness of breath: Secondary | ICD-10-CM

## 2013-06-19 DIAGNOSIS — I509 Heart failure, unspecified: Secondary | ICD-10-CM

## 2013-06-19 DIAGNOSIS — R05 Cough: Secondary | ICD-10-CM

## 2013-06-19 DIAGNOSIS — R059 Cough, unspecified: Secondary | ICD-10-CM

## 2013-06-19 DIAGNOSIS — R0902 Hypoxemia: Secondary | ICD-10-CM

## 2013-06-19 LAB — CBC WITH DIFFERENTIAL/PLATELET
HEMATOCRIT: 39.7 % (ref 36.0–46.0)
HEMOGLOBIN: 13 g/dL (ref 12.0–15.0)
LYMPHS ABS: 1.1 10*3/uL (ref 0.7–4.0)
LYMPHS PCT: 12 % (ref 12–46)
MCH: 30.4 pg (ref 26.0–34.0)
MCHC: 32.9 g/dL (ref 30.0–36.0)
MCV: 92.6 fL (ref 78.0–100.0)
MONO ABS: 0.3 10*3/uL (ref 0.1–1.0)
Monocytes Relative: 4 % (ref 3–12)
NEUTROS ABS: 7.6 10*3/uL (ref 1.7–7.7)
Neutrophils Relative %: 84 % — ABNORMAL HIGH (ref 43–77)
Platelets: 193 10*3/uL (ref 150–400)
RBC: 4.28 MIL/uL (ref 3.87–5.11)
RDW: 17.1 % — AB (ref 11.5–15.5)
WBC: 9.1 10*3/uL (ref 4.0–10.5)

## 2013-06-19 LAB — BASIC METABOLIC PANEL
BUN: 44 mg/dL — ABNORMAL HIGH (ref 6–23)
CO2: 27 mEq/L (ref 19–32)
Calcium: 9.1 mg/dL (ref 8.4–10.5)
Chloride: 90 mEq/L — ABNORMAL LOW (ref 96–112)
Creat: 1.9 mg/dL — ABNORMAL HIGH (ref 0.50–1.10)
Glucose, Bld: 96 mg/dL (ref 70–99)
Potassium: 3.1 mEq/L — ABNORMAL LOW (ref 3.5–5.3)
SODIUM: 136 meq/L (ref 135–145)

## 2013-06-19 LAB — POC INFLUENZA A&B (BINAX/QUICKVUE)
Influenza A, POC: NEGATIVE
Influenza B, POC: NEGATIVE

## 2013-06-19 MED ORDER — HYDROCODONE-HOMATROPINE 5-1.5 MG/5ML PO SYRP: 5.0000 mL | ORAL_SOLUTION | Freq: Three times a day (TID) | ORAL | Status: DC | PRN

## 2013-06-19 MED ORDER — AZITHROMYCIN 250 MG PO TABS: ORAL_TABLET | ORAL | Status: DC

## 2013-06-19 NOTE — Telephone Encounter (Signed)
Patients daughter is aware of Kristian CoveyShane Tysinger PA-C message. CLS

## 2013-06-19 NOTE — Telephone Encounter (Signed)
Thayer OhmChris from [facility] called today because she states pt yesterday's BP was in the 80's systolic today it was 110/70  In the PCP pt's BP was 12/60 today,Weight is the same 141 lbs. BP med's have been held including Lasix. Thayer OhmChris wants to be advice whether to start BP med's. Thayer OhmChris is aware that this message will be send to MD for recommendations.

## 2013-06-19 NOTE — Progress Notes (Signed)
Subjective:  [name] is a 80 y.o. female who presents for cough.  Accompanied by daughter and caregiver. She has multiple medical problems, CHF, on oxygen round the clock per cardiology, numerous medications.  She has been in her usual state of health until about 2 wk ago.  Came in for rib injury/contusion after falling twice.  Has been dealing with rib pain, but over the last few days has had worsening cough, rattly sounding chest, feeling weak.  BPs have been way lower than normal, they called cardiology yesterday about the blood pressure and he stopped all of her blood pressure medicines temporarily.  She is on oxygen around-the-clock, off oxygen normally runs in the mid-80s pulse ox.  Has felt a little worse short of breath last 2 days. Doesn't feel very well.  No other aggravating or relieving factors.  No other c/o.  The following portions of the patient's history were reviewed and updated as appropriate: allergies, current medications, past family history, past medical history, past social history, past surgical history and problem list.  ROS as in subjective  Past Medical History  Diagnosis Date  . Hypertension   . Osteoporosis   . Atrial fib/flutter, transient   . CHF (congestive heart failure)   . Vertigo   . Chronic renal insufficiency   . Hypothyroidism   . Arthritis   . Anxiety   . On home oxygen therapy     uses O2 @ 2l/m nasally bedtime  . Tachy-brady syndrome      treated with amiodarone and permanent DDD pacemaker, 1993     Objective: BP 120/60  Pulse 68  Temp(Src) 98.6 F (37 C) (Oral)  Resp 18  Wt 144 lb (65.318 kg)  SpO2 85%   General appearance: Alert, WD/WN, fatigued appearing                             Skin: warm, no rash, no diaphoresis                           Head: no sinus tenderness                            Eyes: conjunctiva normal, corneas clear, PERRLA                            Ears: pearly TMs, external ear canals normal               Nose: septum midline, turbinates swollen, with erythema and clear discharge             Mouth/throat: MMM, tongue normal, mild pharyngeal erythema                           Neck: supple, no adenopathy, no thyromegaly, nontender, no JVD                          Heart: RRR, normal S1, S2, no murmurs                         Lungs: decreased breath sounds, +rales left mid fields, no rales                Extremities: no edema, nontender  Assessment: Encounter Diagnoses  Name Primary?  . Cough Yes  . SOB (shortness of breath)   . Hypoxemia   . CHF (congestive heart failure)     Plan:  Pulse ox 85% off oxygen, but apparently not very different from baseline. Oxygen nasal cannula from home placed back on her.  Flu test negative.  Will send for CXR, STAT labs sent .   We will call with results and plan.

## 2013-06-19 NOTE — Telephone Encounter (Signed)
New message   Blood pressure  110/70 . After lasix / blood pressure med's were on hold . Weight is the same 141.2.  Sound like she getting a upper resp  infection going to see. Dr. Sharlot GowdaJohn Lalonde. 99.7 as temp .    Please advise on blood pressure med  & lasix today.

## 2013-06-19 NOTE — Telephone Encounter (Signed)
Message copied by Janeice RobinsonSCALES, Madina Galati L on Wed Jun 19, 2013  5:10 PM ------      Message from: Jac CanavanYSINGER, DAVID S      Created: Wed Jun 19, 2013  4:47 PM       CXR shows no pneumonia, labs show low potassium, otherwise everything else stable or fine.  No elevated of white count either.             Given her symptoms, lets use a course of antibiotic for bronchitis/early lung infection, she can begin the Hycodan cough syrup.             She really needs to be good about hydrating throughout the day, rest, continue oxygen.            Call or come in Friday for recheck to make sure things are improving            Save in telephone message.  Abx sent. ------

## 2013-06-19 NOTE — Addendum Note (Signed)
Addended by: Janeice RobinsonSCALES, Derrel Moore L on: 06/19/2013 01:41 PM   Modules accepted: Orders

## 2013-06-19 NOTE — Telephone Encounter (Signed)
Furosemide 120 mg daily. Metolazone 2.5 mg 30 -60 minutes prior to Lasix once a week, each monday

## 2013-06-20 NOTE — Telephone Encounter (Signed)
Follow UP:  [name] Gave 80 of lasix last night instead of 120... BP is 110/60... Pt still lost 3 lbs... Thayer OhmChris needs orders for BP meds and lasix.

## 2013-06-20 NOTE — Telephone Encounter (Signed)
As I recommended below.

## 2013-06-20 NOTE — Telephone Encounter (Signed)
Called [name] [facility]( HH nurse) and she is aware of Dr.Smith's orders.

## 2013-06-21 ENCOUNTER — Telehealth: Payer: Self-pay | Admitting: Medical

## 2013-06-21 NOTE — Telephone Encounter (Signed)
Call and see how things are now.  Is she using the cough medication, any productive cough, wheezing, SOB, fever?

## 2013-06-24 ENCOUNTER — Ambulatory Visit: Payer: MEDICARE | Admitting: Interventional Cardiology

## 2013-06-26 NOTE — Telephone Encounter (Signed)
Patient states that she has some cough and yes she is using the cough medication. She said she has the SOB but she is using her oxygen for that. No fever. Then she says she coughs all the time. The caregiver had step out for a few minutes. I will call her back and see what the caregiver has to say. CLS

## 2013-06-26 NOTE — Telephone Encounter (Signed)
Daughter dawn notified

## 2013-06-26 NOTE — Telephone Encounter (Signed)
If worse in the next 24-48 hours, consider recheck with Dr. Susann GivensLalonde.

## 2013-06-26 NOTE — Telephone Encounter (Signed)
Daughter called back and states that her temp is 97.1. She listened to her lungs and she had wheezing but cleared up when she coughed. Productive cough and it is frequent. Cough syrup not working as well. Her o2 is 95% with 3 liters of oxygen and they hooked up a humidifier to see if that would help any.

## 2013-06-28 ENCOUNTER — Ambulatory Visit (INDEPENDENT_AMBULATORY_CARE_PROVIDER_SITE_OTHER): Payer: MEDICARE | Admitting: Family Medicine

## 2013-06-28 ENCOUNTER — Encounter: Payer: Self-pay | Admitting: Family Medicine

## 2013-06-28 VITALS — BP 120/82 | HR 72 | Temp 97.9°F | Wt 139.0 lb

## 2013-06-28 DIAGNOSIS — R05 Cough: Secondary | ICD-10-CM

## 2013-06-28 DIAGNOSIS — R059 Cough, unspecified: Secondary | ICD-10-CM | POA: Diagnosis not present

## 2013-06-28 DIAGNOSIS — R4181 Age-related cognitive decline: Secondary | ICD-10-CM

## 2013-06-28 DIAGNOSIS — I5032 Chronic diastolic (congestive) heart failure: Secondary | ICD-10-CM

## 2013-06-28 DIAGNOSIS — E876 Hypokalemia: Secondary | ICD-10-CM | POA: Diagnosis not present

## 2013-06-28 DIAGNOSIS — N183 Chronic kidney disease, stage 3 unspecified: Secondary | ICD-10-CM | POA: Diagnosis not present

## 2013-06-28 DIAGNOSIS — I10 Essential (primary) hypertension: Secondary | ICD-10-CM | POA: Diagnosis not present

## 2013-06-28 DIAGNOSIS — R54 Age-related physical debility: Secondary | ICD-10-CM

## 2013-06-28 MED ORDER — HYDROCOD POLST-CHLORPHEN POLST 10-8 MG/5ML PO LQCR: 2.5000 mL | Freq: Two times a day (BID) | ORAL | Status: DC | PRN

## 2013-06-28 MED ORDER — BENZONATATE 100 MG PO CAPS: 100.0000 mg | ORAL_CAPSULE | Freq: Three times a day (TID) | ORAL | Status: DC | PRN

## 2013-06-28 NOTE — Progress Notes (Signed)
Chief Complaint  Patient presents with  . sick    pt has finished her Z pack and she still feels bad, she has coughing spells and feels worse since she came in last week for a URI   Patient was seen 2/11 by Vincenza HewsShane for cough.  CXR didn't show acute changes.  CBC was normal.  Chem panel showed hypokalemia. She was treated with a z-pak, hycodan syrup.  Daughters report that she seems better overall since taking the antibiotic, but she is still coughing. She has run out of the hycodan syrup.  They sometimes give tessalon along with it which also helps.  The syrup helped, but sometimes needs to get her up and give a dose at nighttime.  Found that tussionex was more effective in the past, lasted longer.  She has only had very mild sedation from the meds, and she always has someone with her.  She  has continued cough.  She feels like there is postnasal drainage that is contributing to her cough.  Denies nasal congestion, sinus pain, headaches. Complaining of her chest feeling somewhat tight.  She is better overall, but has persistent cough.  O2 sat drops with extended coughing spells.  Worried that it will throw her into CHF exacerbation.  They present to have lungs rechecked, given her h/o CHF which also causes cough.  Daughter Dawn listened to her lungs and heard some wheezes (which cleared after cough), and wanted a doctor to re-evaluate her.  They felt like she wasn't drinking enough, tried hydrating gently, but patient complains she isn't feeling well.  Family feels like her cough sounds more upper respiratory than her CHF cough  Past Medical History  Diagnosis Date  . Hypertension   . Osteoporosis   . Atrial fib/flutter, transient   . CHF (congestive heart failure)   . Vertigo   . Chronic renal insufficiency   . Hypothyroidism   . Arthritis   . Anxiety   . On home oxygen therapy     uses O2 @ 2l/m nasally bedtime  . Tachy-brady syndrome      treated with amiodarone and permanent DDD pacemaker,  1993   Past Surgical History  Procedure Laterality Date  . Knee arthroscopy      right  . Insert / replace / remove pacemaker    . Total hip arthroplasty      right  . Esophagogastroduodenoscopy N/A 11/26/2012    Procedure: ESOPHAGOGASTRODUODENOSCOPY (EGD);  Surgeon: Charna ElizabethJyothi Mann, MD;  Location: [facility];  Service: Endoscopy;  Laterality: N/A;  . Joint replacement      rt hip  . Shoulder surgery    . Esophagogastroduodenoscopy N/A 03/08/2013    Procedure: ESOPHAGOGASTRODUODENOSCOPY (EGD);  Surgeon: Theda BelfastPatrick D Hung, MD;  [facility];  Service: Endoscopy;  Laterality: N/A;  . Pacemaker insertion     History   Social History  . Marital Status: Married    Spouse Name: N/A    Number of Children: N/A  . Years of Education: N/A   Occupational History  . Not on file.   Social History Main Topics  . Smoking status: Never Smoker   . Smokeless tobacco: Never Used  . Alcohol Use: No  . Drug Use: No  . Sexual Activity: Not Currently   Other Topics Concern  . Not on file   Social History Narrative   Lives at home with husband with daily caregiver.  Three daughters.     Current Outpatient Prescriptions on File Prior to Visit  Medication Sig Dispense Refill  . acetaminophen (TYLENOL ARTHRITIS PAIN) 650 MG CR tablet Take 650 mg by mouth every 8 (eight) hours as needed for pain.      Marland Kitchen amLODipine (NORVASC) 5 MG tablet Take 5 mg by mouth daily.      Marland Kitchen aspirin 81 MG tablet Take 81 mg by mouth daily.      Marland Kitchen atenolol (TENORMIN) 25 MG tablet Take 1 tablet (25 mg total) by mouth 2 (two) times daily.  90 tablet  3  . buPROPion (WELLBUTRIN XL) 150 MG 24 hr tablet Take 300 mg by mouth daily.      . furosemide (LASIX) 80 MG tablet Take 3 tablets (240 mg total) by mouth daily.      . Hydrocodone-Acetaminophen 2.5-325 MG TABS Take 1 tablet by mouth 4 (four) times daily as needed.  30 tablet  0  . hyoscyamine (LEVSIN/SL) 0.125 MG SL tablet Place 1 tablet (0.125 mg total) under the  tongue every 4 (four) hours as needed for cramping.  15 tablet  0  . lactose free nutrition (BOOST PLUS) LIQD Take 237 mLs by mouth 2 (two) times daily between meals.       Marland Kitchen levothyroxine (SYNTHROID, LEVOTHROID) 88 MCG tablet Take 1 tablet (88 mcg total) by mouth daily before breakfast.  90 tablet  0  . loratadine (CLARITIN) 10 MG tablet Take 10 mg by mouth as needed for allergies.      . magnesium hydroxide (MILK OF MAGNESIA) 400 MG/5ML suspension Take 30 mLs by mouth daily as needed for constipation.      . metolazone (ZAROXOLYN) 2.5 MG tablet TAKE 1 TABLET DAILY 1 HOUR BEFORE LASIX  30 tablet  3  . Multiple Vitamins-Minerals (ICAPS PO) Take 1 capsule by mouth daily.      . NON FORMULARY Take 1 tablet by mouth daily. Ultra Flora IB once a day      . omeprazole (PRILOSEC) 20 MG capsule Take 40 mg by mouth every morning.      . potassium chloride (K-DUR,KLOR-CON) 10 MEQ tablet Take 1 tablet (10 mEq total) by mouth daily.  90 tablet  3  . traMADol (ULTRAM) 50 MG tablet Take 1 tablet (50 mg total) by mouth at bedtime as needed. Bid-tid  50 tablet  1  . trimethoprim (TRIMPEX) 100 MG tablet Take 100 mg by mouth daily.      Marland Kitchen estradiol (ESTRACE) 0.1 MG/GM vaginal cream Place 2 g vaginally at bedtime.        No current facility-administered medications on file prior to visit.   Allergies  Allergen Reactions  . Quinidine Other (See Comments)    Skin peels  . Tiazac [Diltiazem Hcl] Hives    "don't remember how bad the reaction was" (04/24/2012)  . Warfarin Sodium Other (See Comments)    GI bleed  . Percocet [Oxycodone-Acetaminophen] Other (See Comments)    confusion  . Promethazine Hcl Other (See Comments)    confusion  . Amiodarone   . Procainamide Other (See Comments)    Lupus reaction  . Septra [Sulfamethoxazole W/Trimethoprim (Co-Trimoxazole)] Other (See Comments)    Dizziness and tremor "don't remember how bad the reaction was" (04/24/2012)  . Amoxicillin Rash  . Ciprofloxacin Rash   . Polocaine [Mepivacaine Hcl] Palpitations   ROS: denies fevers, nausea, vomiting.  Stools a little looser while on the antibiotics. Denies any muscle cramps or spasms.  Feels weak, some diminished appetite.  No bleeding, bruises, rashes, fainting, chest pain, palpitations.  PHYSICAL EXAM: BP 120/82  Pulse 72  Temp(Src) 97.9 F (36.6 C)  Wt 139 lb (63.05 kg) Elderly female, wearing oxygen, in no distress.  Speaks comfortably HEENT:  Conjunctiva clear.  OP clear without moist mucus membranes and no erythema Neck: no lymphadenopathy or mass Heart: rhythm seems regular, normal rate Lungs: clear bilaterally.  Good air movement.  No wheezes, rales, ronchi Skin: no rash Neuro: alert and oriented  Lab Results  Component Value Date   WBC 9.1 06/19/2013   HGB 13.0 06/19/2013   HCT 39.7 06/19/2013   MCV 92.6 06/19/2013   PLT 193 06/19/2013     Chemistry      Component Value Date/Time   NA 136 06/19/2013 1219   NA 137 04/10/2013 1527   K 3.1* 06/19/2013 1219   CL 90* 06/19/2013 1219   CO2 27 06/19/2013 1219   BUN 44* 06/19/2013 1219   BUN 28 04/10/2013 1527   CREATININE 1.90* 06/19/2013 1219   CREATININE 1.73* 05/30/2013 2210      Component Value Date/Time   CALCIUM 9.1 06/19/2013 1219   ALKPHOS 85 06/06/2013 1601   AST 24 06/06/2013 1601   ALT 17 06/06/2013 1601   BILITOT 0.5 06/06/2013 1601      ASSESSMENT/PLAN:  Cough - some improvement s/p z-pak, but ongoing cough.  add mucinex; tussionex with caution, tessalon prn.  reassured no exacerbation of CHF - Plan: chlorpheniramine-HYDROcodone (TUSSIONEX PENNKINETIC ER) 10-8 MG/5ML LQCR, benzonatate (TESSALON) 100 MG capsule  Chronic diastolic heart failure - stable  Chronic kidney disease, stage III (moderate) - stable per last labs  Essential hypertension - controlled  Frailty  Hypokalemia - low on check last week (which was prior to diarrhea from ABX).  recommended taking add'l 10 mEq daily until re-eval'd by cardiology on Monday.  asymptomatic   Refill tessalon Change to tussionex--use with caution Add mucinex (plain)  Take additional potassium supplement once daily through the weekend, and plan to have it rechecked by cardiology on Monday.  Return if increasing cough, shortness of breath, fever, poor oral intake (fluids/food), fevers, dizziness/worsening weakness.  Discussed risks of narcotics in elderly--she has caregivers at home.

## 2013-06-28 NOTE — Patient Instructions (Signed)
Add mucinex (plain) twice daily. Use the tussionex just at bedtime (can use during the day with extreme caution given sedation). You can use tessalon (benzonatate) during the day as needed for cough  Return if increasing cough, shortness of breath, fever, poor oral intake (fluids/food), fevers, dizziness/worsening weakness.  Take an additional potassium pill every day through the weekend.  Let the cardiologists know--likely should have repeat chem panel on Monday.

## 2013-07-01 ENCOUNTER — Telehealth: Payer: Self-pay | Admitting: Interventional Cardiology

## 2013-07-01 ENCOUNTER — Encounter: Payer: Self-pay | Admitting: Physician Assistant

## 2013-07-01 ENCOUNTER — Ambulatory Visit (INDEPENDENT_AMBULATORY_CARE_PROVIDER_SITE_OTHER): Payer: MEDICARE | Admitting: Physician Assistant

## 2013-07-01 VITALS — BP 120/72 | HR 81 | Ht 65.0 in | Wt 145.0 lb

## 2013-07-01 DIAGNOSIS — I5032 Chronic diastolic (congestive) heart failure: Secondary | ICD-10-CM | POA: Diagnosis not present

## 2013-07-01 DIAGNOSIS — E876 Hypokalemia: Secondary | ICD-10-CM

## 2013-07-01 DIAGNOSIS — N183 Chronic kidney disease, stage 3 unspecified: Secondary | ICD-10-CM

## 2013-07-01 DIAGNOSIS — Z950 Presence of cardiac pacemaker: Secondary | ICD-10-CM

## 2013-07-01 DIAGNOSIS — I10 Essential (primary) hypertension: Secondary | ICD-10-CM | POA: Diagnosis not present

## 2013-07-01 DIAGNOSIS — I4891 Unspecified atrial fibrillation: Secondary | ICD-10-CM | POA: Diagnosis not present

## 2013-07-01 DIAGNOSIS — I495 Sick sinus syndrome: Secondary | ICD-10-CM

## 2013-07-01 LAB — BASIC METABOLIC PANEL
BUN: 34 mg/dL — AB (ref 6–23)
CALCIUM: 9.5 mg/dL (ref 8.4–10.5)
CO2: 29 mEq/L (ref 19–32)
Chloride: 103 mEq/L (ref 96–112)
Creatinine, Ser: 1.9 mg/dL — ABNORMAL HIGH (ref 0.4–1.2)
GFR: 26.65 mL/min — ABNORMAL LOW (ref >60.00)
Glucose, Bld: 115 mg/dL — ABNORMAL HIGH (ref 70–99)
Potassium: 3.7 mEq/L (ref 3.5–5.1)
SODIUM: 140 meq/L (ref 135–145)

## 2013-07-01 NOTE — Progress Notes (Signed)
[address] [place], [place]  [address] Phone: [phone] Fax:  [phone]  Date:  07/01/2013   ID:  [name], DOB [birth year], MRN [id number]  PCP:  Carollee HerterLALONDE,JOHN CHARLES, MD  Cardiologist:  Dr. Verdis PrimeHenry Smith    History of Present Illness: [name] is a 80 y.o. female with a hx of HTN, parox chronic AFib, diastolic CHF, CKD, hypothyroidism, vertigo, OA, Tachy-Brady syndrome s/p pacemaker. She was previously on Amiodarone.  She had a life threatening spontaneous retroperitoneal bleed in 2005 and has been off of coumadin since.  Last seen by Dr. Verdis PrimeHenry Smith in 04/2013.  She has recently been seen by primary care for management of a cough.  Her HHRN asked that she also follow up with cardiology.    Echo (03/2013):  Mod focal basal hypertrophy, EF 60-65%, no RWMA, Gr 2 DD, mod LAE, mild RVE, severe RAE, mod TR, PASP 88 (severe pulmonary HTN).  Patient is here with her daughter and caretaker.  Her weights have been stable. Her breathing is overall stable.  She is chronically sleeping on 3 pillows.  She denies PND, edema.  She sleeps with O2.  She denies chest pain.  She denies syncope.    Recent Labs: 04/01/2013: TSH 1.568  05/30/2013: Pro B Natriuretic peptide (BNP) 1894.0*  06/06/2013: ALT 17  06/19/2013: Creatinine 1.90*; Hemoglobin 13.0; Potassium 3.1*   Wt Readings from Last 3 Encounters:  07/01/13 145 lb (65.772 kg)  06/28/13 139 lb (63.05 kg)  06/19/13 144 lb (65.318 kg)     Past Medical History  Diagnosis Date  . Hypertension   . Osteoporosis   . Atrial fib/flutter, transient   . CHF (congestive heart failure)   . Vertigo   . Chronic renal insufficiency   . Hypothyroidism   . Arthritis   . Anxiety   . On home oxygen therapy     uses O2 @ 2l/m nasally bedtime  . Tachy-brady syndrome      treated with amiodarone and permanent DDD pacemaker, 1993    Current Outpatient Prescriptions  Medication Sig Dispense Refill  . acetaminophen  (TYLENOL ARTHRITIS PAIN) 650 MG CR tablet Take 650 mg by mouth every 8 (eight) hours as needed for pain.      Marland Kitchen. amLODipine (NORVASC) 5 MG tablet Take 5 mg by mouth daily.      Marland Kitchen. aspirin 81 MG tablet Take 81 mg by mouth daily.      Marland Kitchen. atenolol (TENORMIN) 25 MG tablet Take 1 tablet (25 mg total) by mouth 2 (two) times daily.  90 tablet  3  . benzonatate (TESSALON) 100 MG capsule Take 1 capsule (100 mg total) by mouth 3 (three) times daily as needed for cough.  30 capsule  0  . buPROPion (WELLBUTRIN XL) 150 MG 24 hr tablet Take 300 mg by mouth daily.      . chlorpheniramine-HYDROcodone (TUSSIONEX PENNKINETIC ER) 10-8 MG/5ML LQCR Take 2.5-5 mLs by mouth every 12 (twelve) hours as needed for cough.  75 mL  0  . estradiol (ESTRACE) 0.1 MG/GM vaginal cream Place 2 g vaginally at bedtime.       . furosemide (LASIX) 80 MG tablet Take 120 mg by mouth daily.      . hyoscyamine (LEVSIN/SL) 0.125 MG SL tablet Place 1 tablet (0.125 mg total) under the tongue every 4 (four) hours as needed for cramping.  15 tablet  0  . lactose free nutrition (BOOST PLUS) LIQD Take 237 mLs by  mouth 2 (two) times daily between meals.       Marland Kitchen levothyroxine (SYNTHROID, LEVOTHROID) 88 MCG tablet Take 1 tablet (88 mcg total) by mouth daily before breakfast.  90 tablet  0  . loratadine (CLARITIN) 10 MG tablet Take 10 mg by mouth as needed for allergies.      . magnesium hydroxide (MILK OF MAGNESIA) 400 MG/5ML suspension Take 30 mLs by mouth daily as needed for constipation.      . Multiple Vitamins-Minerals (ICAPS PO) Take 1 capsule by mouth daily.      . NON FORMULARY Take 1 tablet by mouth daily. Ultra Flora IB once a day      . omeprazole (PRILOSEC) 20 MG capsule Take 40 mg by mouth every morning.      . potassium chloride (K-DUR,KLOR-CON) 10 MEQ tablet Take 1 tablet (10 mEq total) by mouth daily.  90 tablet  3  . traMADol (ULTRAM) 50 MG tablet Take 1 tablet (50 mg total) by mouth at bedtime as needed. Bid-tid  50 tablet  1  .  trimethoprim (TRIMPEX) 100 MG tablet Take 100 mg by mouth daily.      . metolazone (ZAROXOLYN) 2.5 MG tablet Take 2.5 mg by mouth once a week. TAKE 1 TABLET DAILY 1 HOUR BEFORE LASIX ON MONDAY       No current facility-administered medications for this visit.    Allergies:   Quinidine; Tiazac; Warfarin sodium; Percocet; Promethazine hcl; Amiodarone; Procainamide; Septra; Amoxicillin; Ciprofloxacin; and Polocaine   Social History:  The patient  reports that she has never smoked. She has never used smokeless tobacco. She reports that she does not drink alcohol or use illicit drugs.   Family History:  The patient's family history includes Heart disease in her mother; Hypertension in her mother.   ROS:  Please see the history of present illness.   She has a non-productive cough.  No fever.  She has had some diarrhea recently.   All other systems reviewed and negative.   PHYSICAL EXAM: VS:  BP 120/72  Pulse 81  Ht 5\' 5"  (1.651 m)  Wt 145 lb (65.772 kg)  BMI 24.13 kg/m2 Well nourished, well developed, in no acute distress HEENT: normal Neck: difficult to assess but no obvious JVD Cardiac:  normal S1, S2; irregularly irregular rhythm; no murmur Lungs:  clear to auscultation bilaterally, no wheezing, rhonchi or rales Abd: soft, nontender, no hepatomegaly Ext: no edema Skin: warm and dry Neuro:  CNs 2-12 intact, no focal abnormalities noted  EKG:  AFib, HR 81, inf-lat TWI, occ V pacing, no change from prior tracing  ASSESSMENT AND PLAN:  1. Chronic Diastolic CHF:  Volume appears stable.  Continue current Rx.  2. Hypokalemia:  Repeat BMET today.  If K+ remains low, adjust K+ to higher dose on Metolazone days and repeat in 1 week.   3. Atrial Fibrillation:  Rate controlled.  She is not a candidate for coumadin due to hx of spontaneous, life threatening retroperitoneal bleed.   4. Tachy-Brady Syndrome, s/p Pacemaker:  F/u with EP as planned.  5. Chronic Kidney Disease:  Repeat BMET today as  noted.  6. Hypertension:  Controlled.  7. Disposition:  Patient stable.  I offered to reschedule appointment with Dr. Verdis Prime for 2 mos from now.  She prefers to see him 07/11/13 as planned.   Signed, Tereso Newcomer, PA-C  07/01/2013 2:53 PM

## 2013-07-01 NOTE — Telephone Encounter (Signed)
New Message  [name]//[facility] called. She states that there are 4 outstanding orders 3 are skilled nursing and 1 is for Home health certification and plan of care.. Faxed on 06/18/2013.. Will refax if needed. Please call to confirm receipt of previous orders to prevent duplication

## 2013-07-01 NOTE — Patient Instructions (Signed)
Your physician assistant recommends that you have lab work today:  Basic Metabolic Panel  Your physician assistant recommends that you follow-up with Dr. Johney FrameAllred and Dr. Katrinka BlazingSmith as planned.  Your physician assistant recommends that you continue on your current medications as directed. Please refer to the Current Medication list given to you today.

## 2013-07-02 ENCOUNTER — Other Ambulatory Visit: Payer: Self-pay | Admitting: *Deleted

## 2013-07-02 DIAGNOSIS — I10 Essential (primary) hypertension: Secondary | ICD-10-CM

## 2013-07-03 ENCOUNTER — Encounter: Payer: MEDICARE | Admitting: Internal Medicine

## 2013-07-10 ENCOUNTER — Telehealth: Payer: Self-pay | Admitting: Internal Medicine

## 2013-07-10 NOTE — Telephone Encounter (Signed)
[name] [name] called to see if they could continue PT another 2 weeks 2 times a week. He will take a verbal over phone. Call [name] at [phone]

## 2013-07-10 NOTE — Telephone Encounter (Signed)
The order was given

## 2013-07-11 ENCOUNTER — Ambulatory Visit: Payer: MEDICARE | Admitting: Interventional Cardiology

## 2013-07-16 ENCOUNTER — Other Ambulatory Visit (INDEPENDENT_AMBULATORY_CARE_PROVIDER_SITE_OTHER): Payer: MEDICARE

## 2013-07-16 DIAGNOSIS — I5032 Chronic diastolic (congestive) heart failure: Secondary | ICD-10-CM

## 2013-07-16 LAB — BASIC METABOLIC PANEL
BUN: 34 mg/dL — AB (ref 6–23)
CHLORIDE: 101 meq/L (ref 96–112)
CO2: 24 meq/L (ref 19–32)
Calcium: 9.7 mg/dL (ref 8.4–10.5)
Creatinine, Ser: 1.9 mg/dL — ABNORMAL HIGH (ref 0.4–1.2)
GFR: 26 mL/min — ABNORMAL LOW (ref >60.00)
Glucose, Bld: 121 mg/dL — ABNORMAL HIGH (ref 70–99)
Potassium: 3.4 mEq/L — ABNORMAL LOW (ref 3.5–5.1)
Sodium: 138 mEq/L (ref 135–145)

## 2013-07-18 ENCOUNTER — Telehealth: Payer: Self-pay

## 2013-07-18 NOTE — Telephone Encounter (Signed)
Message copied by Jarvis NewcomerPARRIS-GODLEY, LISA S on Thu Jul 18, 2013  2:32 PM ------      Message from: Verdis PrimeSMITH, HENRY      Created: Thu Jul 18, 2013 11:42 AM       Stable function and low normal potassium ------

## 2013-07-18 NOTE — Telephone Encounter (Signed)
lmom.Stable function and low normal potassium

## 2013-07-22 NOTE — Telephone Encounter (Signed)
[name] is being disc[facility] this wk.pt doing well, able to venture out 3 -4 times a wk.pt family aware.[facility] to be called back in If/When needed.[facility]

## 2013-07-22 NOTE — Telephone Encounter (Signed)
New message    Discharge patient this week. Please call back when  needed.

## 2013-07-22 NOTE — Telephone Encounter (Signed)
lmom for Wynona CanesChristine to call back. to give more info

## 2013-07-23 ENCOUNTER — Telehealth: Payer: Self-pay | Admitting: Family Medicine

## 2013-07-23 NOTE — Telephone Encounter (Signed)
Call in ondansetron 4 mg orally disintegrating give 12. One every 6 hours when necessary nausea/vomiting

## 2013-07-23 NOTE — Telephone Encounter (Signed)
Medication called in 

## 2013-07-24 ENCOUNTER — Telehealth: Payer: Self-pay | Admitting: Family Medicine

## 2013-07-24 NOTE — Telephone Encounter (Signed)
Daughter [name] informed that pt can take up to 8 Imodium per day, and if not better call us tomorrow, may need to be seen

## 2013-07-24 NOTE — Telephone Encounter (Signed)
Let her know that they can take 8 Imodium a day

## 2013-07-24 NOTE — Telephone Encounter (Signed)
Dtr states Zofran helped nausea, but diarrhea still bad, pt has went 20+ times today and has taken 4 Imodium already.  Bad cramps.  She is drinking water. She wants to know if there is anything else you can call her in for diarrhea & cramps and if there are any other suggestions.  She doesn't want her to get dehydrated.

## 2013-07-25 ENCOUNTER — Telehealth: Payer: Self-pay | Admitting: Family Medicine

## 2013-07-25 ENCOUNTER — Emergency Department (HOSPITAL_BASED_OUTPATIENT_CLINIC_OR_DEPARTMENT_OTHER): Payer: MEDICARE

## 2013-07-25 ENCOUNTER — Inpatient Hospital Stay (HOSPITAL_BASED_OUTPATIENT_CLINIC_OR_DEPARTMENT_OTHER)
Admission: EM | Admit: 2013-07-25 | Discharge: 2013-07-28 | DRG: 392 | Disposition: A | Discharge status: Home / Self Care | Payer: MEDICARE | Attending: Internal Medicine | Admitting: Internal Medicine

## 2013-07-25 ENCOUNTER — Telehealth: Payer: Self-pay

## 2013-07-25 ENCOUNTER — Encounter (HOSPITAL_BASED_OUTPATIENT_CLINIC_OR_DEPARTMENT_OTHER): Payer: Self-pay | Admitting: Emergency Medicine

## 2013-07-25 DIAGNOSIS — N182 Chronic kidney disease, stage 2 (mild): Secondary | ICD-10-CM | POA: Diagnosis present

## 2013-07-25 DIAGNOSIS — Z950 Presence of cardiac pacemaker: Secondary | ICD-10-CM

## 2013-07-25 DIAGNOSIS — N179 Acute kidney failure, unspecified: Secondary | ICD-10-CM | POA: Diagnosis present

## 2013-07-25 DIAGNOSIS — E860 Dehydration: Secondary | ICD-10-CM | POA: Diagnosis present

## 2013-07-25 DIAGNOSIS — N183 Chronic kidney disease, stage 3 unspecified: Secondary | ICD-10-CM

## 2013-07-25 DIAGNOSIS — R079 Chest pain, unspecified: Secondary | ICD-10-CM | POA: Diagnosis present

## 2013-07-25 DIAGNOSIS — I4891 Unspecified atrial fibrillation: Secondary | ICD-10-CM | POA: Diagnosis present

## 2013-07-25 DIAGNOSIS — R42 Dizziness and giddiness: Secondary | ICD-10-CM

## 2013-07-25 DIAGNOSIS — M810 Age-related osteoporosis without current pathological fracture: Secondary | ICD-10-CM | POA: Diagnosis present

## 2013-07-25 DIAGNOSIS — R197 Diarrhea, unspecified: Secondary | ICD-10-CM | POA: Diagnosis present

## 2013-07-25 DIAGNOSIS — Z9981 Dependence on supplemental oxygen: Secondary | ICD-10-CM

## 2013-07-25 DIAGNOSIS — F411 Generalized anxiety disorder: Secondary | ICD-10-CM | POA: Diagnosis present

## 2013-07-25 DIAGNOSIS — I129 Hypertensive chronic kidney disease with stage 1 through stage 4 chronic kidney disease, or unspecified chronic kidney disease: Secondary | ICD-10-CM | POA: Diagnosis present

## 2013-07-25 DIAGNOSIS — M1990 Unspecified osteoarthritis, unspecified site: Secondary | ICD-10-CM

## 2013-07-25 DIAGNOSIS — R109 Unspecified abdominal pain: Secondary | ICD-10-CM | POA: Diagnosis present

## 2013-07-25 DIAGNOSIS — I509 Heart failure, unspecified: Secondary | ICD-10-CM | POA: Diagnosis present

## 2013-07-25 DIAGNOSIS — I5032 Chronic diastolic (congestive) heart failure: Secondary | ICD-10-CM | POA: Diagnosis present

## 2013-07-25 DIAGNOSIS — A080 Rotaviral enteritis: Secondary | ICD-10-CM | POA: Diagnosis present

## 2013-07-25 DIAGNOSIS — M549 Dorsalgia, unspecified: Secondary | ICD-10-CM

## 2013-07-25 DIAGNOSIS — E039 Hypothyroidism, unspecified: Secondary | ICD-10-CM | POA: Diagnosis present

## 2013-07-25 DIAGNOSIS — Z96649 Presence of unspecified artificial hip joint: Secondary | ICD-10-CM

## 2013-07-25 DIAGNOSIS — E876 Hypokalemia: Secondary | ICD-10-CM | POA: Diagnosis present

## 2013-07-25 DIAGNOSIS — R630 Anorexia: Secondary | ICD-10-CM

## 2013-07-25 HISTORY — DX: Gastro-esophageal reflux disease without esophagitis: K21.9

## 2013-07-25 LAB — CBC WITH DIFFERENTIAL/PLATELET
BASOS ABS: 0 10*3/uL (ref 0.0–0.1)
Basophils Relative: 0 % (ref 0–1)
Eosinophils Absolute: 0 10*3/uL (ref 0.0–0.7)
Eosinophils Relative: 0 % (ref 0–5)
HCT: 43 % (ref 36.0–46.0)
Hemoglobin: 14.1 g/dL (ref 12.0–15.0)
Lymphocytes Relative: 8 % — ABNORMAL LOW (ref 12–46)
Lymphs Abs: 1.5 10*3/uL (ref 0.7–4.0)
MCH: 31.5 pg (ref 26.0–34.0)
MCHC: 32.8 g/dL (ref 30.0–36.0)
MCV: 96 fL (ref 78.0–100.0)
Monocytes Absolute: 1.6 10*3/uL — ABNORMAL HIGH (ref 0.1–1.0)
Monocytes Relative: 9 % (ref 3–12)
NEUTROS ABS: 15.4 10*3/uL — AB (ref 1.7–7.7)
NEUTROS PCT: 83 % — AB (ref 43–77)
Platelets: 212 10*3/uL (ref 150–400)
RBC: 4.48 MIL/uL (ref 3.87–5.11)
RDW: 18.2 % — AB (ref 11.5–15.5)
WBC: 18.4 10*3/uL — ABNORMAL HIGH (ref 4.0–10.5)

## 2013-07-25 LAB — COMPREHENSIVE METABOLIC PANEL
ALBUMIN: 3.9 g/dL (ref 3.5–5.2)
ALT: 189 U/L — ABNORMAL HIGH (ref 0–35)
ALT: 178 U/L — AB (ref 0–35)
AST: 261 U/L — AB (ref 0–37)
AST: 219 U/L — ABNORMAL HIGH (ref 0–37)
Albumin: 2.9 g/dL — ABNORMAL LOW (ref 3.5–5.2)
Alkaline Phosphatase: 118 U/L — ABNORMAL HIGH (ref 39–117)
Alkaline Phosphatase: 87 U/L (ref 39–117)
BILIRUBIN TOTAL: 0.9 mg/dL (ref 0.3–1.2)
BILIRUBIN TOTAL: 0.6 mg/dL (ref 0.3–1.2)
BUN: 37 mg/dL — ABNORMAL HIGH (ref 6–23)
BUN: 35 mg/dL — ABNORMAL HIGH (ref 6–23)
CALCIUM: 8.3 mg/dL — AB (ref 8.4–10.5)
CHLORIDE: 90 meq/L — AB (ref 96–112)
CO2: 24 mEq/L (ref 19–32)
CO2: 21 meq/L (ref 19–32)
CREATININE: 1.9 mg/dL — AB (ref 0.50–1.10)
Calcium: 9.1 mg/dL (ref 8.4–10.5)
Chloride: 97 mEq/L (ref 96–112)
Creatinine, Ser: 1.7 mg/dL — ABNORMAL HIGH (ref 0.50–1.10)
GFR calc Af Amer: 25 mL/min — ABNORMAL LOW (ref >90)
GFR calc Af Amer: 29 mL/min — ABNORMAL LOW (ref >90)
GFR calc non Af Amer: 22 mL/min — ABNORMAL LOW (ref >90)
GFR, EST NON AFRICAN AMERICAN: 25 mL/min — AB (ref >90)
Glucose, Bld: 91 mg/dL (ref 70–99)
Glucose, Bld: 72 mg/dL (ref 70–99)
Potassium: 2.9 mEq/L — CL (ref 3.7–5.3)
Potassium: 3.3 mEq/L — ABNORMAL LOW (ref 3.7–5.3)
Sodium: 133 mEq/L — ABNORMAL LOW (ref 137–147)
Sodium: 134 mEq/L — ABNORMAL LOW (ref 137–147)
TOTAL PROTEIN: 7.8 g/dL (ref 6.0–8.3)
Total Protein: 6.1 g/dL (ref 6.0–8.3)

## 2013-07-25 LAB — URINE MICROSCOPIC-ADD ON

## 2013-07-25 LAB — URINALYSIS, ROUTINE W REFLEX MICROSCOPIC
Bilirubin Urine: NEGATIVE
GLUCOSE, UA: NEGATIVE mg/dL
Ketones, ur: NEGATIVE mg/dL
Leukocytes, UA: NEGATIVE
Nitrite: NEGATIVE
PH: 6 (ref 5.0–8.0)
Protein, ur: 30 mg/dL — AB
Specific Gravity, Urine: 1.015 (ref 1.005–1.030)
Urobilinogen, UA: 0.2 mg/dL (ref 0.0–1.0)

## 2013-07-25 LAB — GI PATHOGEN PANEL BY PCR, STOOL
C DIFFICILE TOXIN A/B: NEGATIVE
CAMPYLOBACTER BY PCR: NEGATIVE
Cryptosporidium by PCR: NEGATIVE
E COLI (ETEC) LT/ST: NEGATIVE
E COLI (STEC): NEGATIVE
E COLI 0157 BY PCR: NEGATIVE
G lamblia by PCR: NEGATIVE
NOROVIRUS G1/G2: NEGATIVE
Rotavirus A by PCR: POSITIVE
Salmonella by PCR: NEGATIVE
Shigella by PCR: NEGATIVE

## 2013-07-25 LAB — PRO B NATRIURETIC PEPTIDE: PRO B NATRI PEPTIDE: 4206 pg/mL — AB (ref 0–450)

## 2013-07-25 LAB — PHOSPHORUS: PHOSPHORUS: 2.7 mg/dL (ref 2.3–4.6)

## 2013-07-25 LAB — MAGNESIUM
Magnesium: 2.1 mg/dL (ref 1.5–2.5)
Magnesium: 2.1 mg/dL (ref 1.5–2.5)

## 2013-07-25 LAB — TROPONIN I

## 2013-07-25 LAB — OCCULT BLOOD X 1 CARD TO LAB, STOOL: FECAL OCCULT BLD: NEGATIVE

## 2013-07-25 LAB — LIPASE, BLOOD: LIPASE: 49 U/L (ref 11–59)

## 2013-07-25 MED ORDER — ASPIRIN 81 MG PO TABS: 81.0000 mg | ORAL_TABLET | Freq: Every day | ORAL | Status: DC

## 2013-07-25 MED ORDER — ACETAMINOPHEN 325 MG PO TABS: 650.0000 mg | ORAL_TABLET | Freq: Three times a day (TID) | ORAL | Status: DC | PRN

## 2013-07-25 MED ORDER — POTASSIUM CHLORIDE CRYS ER 20 MEQ PO TBCR
40.0000 meq | EXTENDED_RELEASE_TABLET | Freq: Once | ORAL | Status: AC
  Administered 2013-07-25: 40 meq via ORAL
  Filled 2013-07-25: qty 2

## 2013-07-25 MED ORDER — LEVOTHYROXINE SODIUM 88 MCG PO TABS
88.0000 ug | ORAL_TABLET | Freq: Every day | ORAL | Status: DC
  Administered 2013-07-26 – 2013-07-28 (×3): 88 ug via ORAL
  Filled 2013-07-25 (×4): qty 1

## 2013-07-25 MED ORDER — ATENOLOL 25 MG PO TABS
25.0000 mg | ORAL_TABLET | Freq: Once | ORAL | Status: AC
  Administered 2013-07-25: 25 mg via ORAL
  Filled 2013-07-25: qty 1

## 2013-07-25 MED ORDER — SODIUM CHLORIDE 0.9 % IV SOLN: INTRAVENOUS | Status: DC

## 2013-07-25 MED ORDER — MAGNESIUM HYDROXIDE 400 MG/5ML PO SUSP: 30.0000 mL | Freq: Every day | ORAL | Status: DC | PRN

## 2013-07-25 MED ORDER — HYDROCOD POLST-CHLORPHEN POLST 10-8 MG/5ML PO LQCR: 2.5000 mL | Freq: Two times a day (BID) | ORAL | Status: DC | PRN

## 2013-07-25 MED ORDER — ONDANSETRON HCL 4 MG PO TABS: 4.0000 mg | ORAL_TABLET | Freq: Four times a day (QID) | ORAL | Status: DC | PRN

## 2013-07-25 MED ORDER — ENOXAPARIN SODIUM 40 MG/0.4ML ~~LOC~~ SOLN
40.0000 mg | SUBCUTANEOUS | Status: DC
  Filled 2013-07-25: qty 0.4

## 2013-07-25 MED ORDER — ONDANSETRON HCL 4 MG/2ML IJ SOLN: 4.0000 mg | Freq: Four times a day (QID) | INTRAMUSCULAR | Status: DC | PRN

## 2013-07-25 MED ORDER — ASPIRIN EC 81 MG PO TBEC
81.0000 mg | DELAYED_RELEASE_TABLET | Freq: Every day | ORAL | Status: DC
  Administered 2013-07-25 – 2013-07-28 (×4): 81 mg via ORAL
  Filled 2013-07-25 (×4): qty 1

## 2013-07-25 MED ORDER — PANTOPRAZOLE SODIUM 40 MG PO TBEC
40.0000 mg | DELAYED_RELEASE_TABLET | Freq: Every day | ORAL | Status: DC
  Administered 2013-07-25 – 2013-07-28 (×4): 40 mg via ORAL
  Filled 2013-07-25 (×4): qty 1

## 2013-07-25 MED ORDER — SODIUM CHLORIDE 0.9 % IV BOLUS (SEPSIS)
500.0000 mL | Freq: Once | INTRAVENOUS | Status: AC
  Administered 2013-07-25: 500 mL via INTRAVENOUS

## 2013-07-25 MED ORDER — ENOXAPARIN SODIUM 30 MG/0.3ML ~~LOC~~ SOLN
30.0000 mg | SUBCUTANEOUS | Status: DC
  Administered 2013-07-25 – 2013-07-27 (×3): 30 mg via SUBCUTANEOUS
  Filled 2013-07-25 (×4): qty 0.3

## 2013-07-25 MED ORDER — POTASSIUM CHLORIDE CRYS ER 10 MEQ PO TBCR
10.0000 meq | EXTENDED_RELEASE_TABLET | Freq: Every day | ORAL | Status: DC
Start: 2013-07-25 — End: 2013-07-26
  Administered 2013-07-25 – 2013-07-26 (×2): 10 meq via ORAL
  Filled 2013-07-25 (×2): qty 1

## 2013-07-25 MED ORDER — BOOST PLUS PO LIQD
237.0000 mL | Freq: Two times a day (BID) | ORAL | Status: DC
  Administered 2013-07-26 – 2013-07-28 (×5): 237 mL via ORAL
  Filled 2013-07-25 (×8): qty 237

## 2013-07-25 MED ORDER — AMLODIPINE BESYLATE 5 MG PO TABS
5.0000 mg | ORAL_TABLET | Freq: Every day | ORAL | Status: DC
  Administered 2013-07-25 – 2013-07-28 (×4): 5 mg via ORAL
  Filled 2013-07-25 (×4): qty 1

## 2013-07-25 MED ORDER — ATENOLOL 25 MG PO TABS
25.0000 mg | ORAL_TABLET | Freq: Two times a day (BID) | ORAL | Status: DC
  Administered 2013-07-26 – 2013-07-28 (×5): 25 mg via ORAL
  Filled 2013-07-25 (×7): qty 1

## 2013-07-25 MED ORDER — BUPROPION HCL ER (XL) 300 MG PO TB24
300.0000 mg | ORAL_TABLET | Freq: Every day | ORAL | Status: DC
  Administered 2013-07-25 – 2013-07-28 (×4): 300 mg via ORAL
  Filled 2013-07-25 (×4): qty 1

## 2013-07-25 MED ORDER — ACETAMINOPHEN ER 650 MG PO TBCR: 650.0000 mg | EXTENDED_RELEASE_TABLET | Freq: Three times a day (TID) | ORAL | Status: DC | PRN

## 2013-07-25 MED ORDER — SODIUM CHLORIDE 0.9 % IJ SOLN
3.0000 mL | Freq: Two times a day (BID) | INTRAMUSCULAR | Status: DC
  Administered 2013-07-27 – 2013-07-28 (×2): 3 mL via INTRAVENOUS

## 2013-07-25 MED ORDER — ESTRADIOL 0.1 MG/GM VA CREA
1.5000 g | TOPICAL_CREAM | Freq: Every day | VAGINAL | Status: DC
  Administered 2013-07-26 – 2013-07-27 (×2): 0.25 via VAGINAL
  Filled 2013-07-25: qty 42.5

## 2013-07-25 MED ORDER — BENZONATATE 100 MG PO CAPS
100.0000 mg | ORAL_CAPSULE | Freq: Three times a day (TID) | ORAL | Status: DC | PRN
  Filled 2013-07-25: qty 1

## 2013-07-25 MED ORDER — TRIMETHOPRIM 100 MG PO TABS
100.0000 mg | ORAL_TABLET | Freq: Every day | ORAL | Status: DC
  Administered 2013-07-25 – 2013-07-28 (×4): 100 mg via ORAL
  Filled 2013-07-25 (×4): qty 1

## 2013-07-25 MED ORDER — POTASSIUM CHLORIDE 20 MEQ/15ML (10%) PO LIQD
ORAL | Status: AC
  Administered 2013-07-25: 40 meq
  Filled 2013-07-25: qty 30

## 2013-07-25 MED ORDER — LORATADINE 10 MG PO TABS
10.0000 mg | ORAL_TABLET | Freq: Every day | ORAL | Status: DC | PRN
  Filled 2013-07-25: qty 1

## 2013-07-25 MED ORDER — HYOSCYAMINE SULFATE 0.125 MG SL SUBL
0.1250 mg | SUBLINGUAL_TABLET | SUBLINGUAL | Status: DC | PRN
  Administered 2013-07-26: 0.125 mg via SUBLINGUAL
  Filled 2013-07-25: qty 1

## 2013-07-25 MED ORDER — SODIUM CHLORIDE 0.9 % IV SOLN: INTRAVENOUS | Status: AC

## 2013-07-25 MED ORDER — TRAMADOL HCL 50 MG PO TABS: 50.0000 mg | ORAL_TABLET | Freq: Every evening | ORAL | Status: DC | PRN

## 2013-07-25 NOTE — ED Notes (Signed)
K+ 2.9 reported to EDP

## 2013-07-25 NOTE — ED Provider Notes (Signed)
CSN: [id number]     Arrival date & time 07/25/13  1144 History   First MD Initiated Contact with Patient 07/25/13 1145     Chief Complaint  Patient presents with  . Diarrhea     (Consider location/radiation/quality/duration/timing/severity/associated sxs/prior Treatment) HPI Comments: Patient brought to the ER for evaluation of generalized weakness. She has had nausea, vomiting and diarrhea for 2 days. Her doctor called in medication for the nausea and vomiting, which has stopped. Patient has had persistent watery diarrhea and now has become very weak. She denies abdominal pain. There has not been fever. Patient denies chest pain and shortness of breath.  Patient is a 80 y.o. female presenting with diarrhea.  Diarrhea Associated symptoms: vomiting     Past Medical History  Diagnosis Date  . Hypertension   . Osteoporosis   . Atrial fib/flutter, transient   . CHF (congestive heart failure)   . Vertigo   . Chronic renal insufficiency   . Hypothyroidism   . Arthritis   . Anxiety   . On home oxygen therapy     uses O2 @ 2l/m nasally bedtime  . Tachy-brady syndrome      treated with amiodarone and permanent DDD pacemaker, 1993  . GERD (gastroesophageal reflux disease)   . Pacemaker    Past Surgical History  Procedure Laterality Date  . Knee arthroscopy Right   . Insert / replace / remove pacemaker    . Total hip arthroplasty Right   . Esophagogastroduodenoscopy N/A 11/26/2012    Procedure: ESOPHAGOGASTRODUODENOSCOPY (EGD);  Surgeon: Charna Elizabeth, MD;  Location: [facility];  Service: Endoscopy;  Laterality: N/A;  . Joint replacement      rt hip  . Shoulder surgery    . Esophagogastroduodenoscopy N/A 03/08/2013    Procedure: ESOPHAGOGASTRODUODENOSCOPY (EGD);  Surgeon: Theda Belfast, MD;  Location: [facility];  Service: Endoscopy;  Laterality: N/A;  . Pacemaker insertion    . Cholecystectomy     Family History  Problem Relation Age of Onset  . Hypertension Mother   .  Heart disease Mother     Heart failure (vague history)   History  Substance Use Topics  . Smoking status: Never Smoker   . Smokeless tobacco: Never Used  . Alcohol Use: No   OB History   Grav Para Term Preterm Abortions TAB SAB Ect Mult Living                 Review of Systems  Constitutional: Positive for fatigue.  Gastrointestinal: Positive for nausea, vomiting and diarrhea. Negative for blood in stool and anal bleeding.  All other systems reviewed and are negative.      Allergies  Quinidine; Tiazac; Warfarin sodium; Percocet; Promethazine hcl; Amiodarone; Procainamide; Warfarin and related; Amoxicillin; Ciprofloxacin; Polocaine; and Septra  Home Medications   No current outpatient prescriptions on file. BP 101/63  Pulse 81  Temp(Src) 98.1 F (36.7 C) (Oral)  Resp 16  Ht 5\' 5"  (1.651 m)  Wt 143 lb 12.8 oz (65.227 kg)  BMI 23.93 kg/m2  SpO2 93% Physical Exam  Constitutional: She is oriented to person, place, and time. She appears well-developed and well-nourished. No distress.  HENT:  Head: Normocephalic and atraumatic.  Right Ear: Hearing normal.  Left Ear: Hearing normal.  Nose: Nose normal.  Mouth/Throat: Oropharynx is clear and moist and mucous membranes are normal.  Eyes: Conjunctivae and EOM are normal. Pupils are equal, round, and reactive to light.  Neck: Normal range of motion. Neck supple.  Cardiovascular:  Regular rhythm, S1 normal and S2 normal.  Exam reveals no gallop and no friction rub.   No murmur heard. Pulmonary/Chest: Effort normal and breath sounds normal. No respiratory distress. She exhibits no tenderness.  Abdominal: Soft. Normal appearance and bowel sounds are normal. She exhibits distension. There is no hepatosplenomegaly. There is no tenderness. There is no rebound, no guarding, no tenderness at McBurney's point and negative Murphy's sign. No hernia.  Musculoskeletal: Normal range of motion.  Neurological: She is alert and oriented to  person, place, and time. She has normal strength. No cranial nerve deficit or sensory deficit. Coordination normal. GCS eye subscore is 4. GCS verbal subscore is 5. GCS motor subscore is 6.  Skin: Skin is warm, dry and intact. No rash noted. No cyanosis.  Psychiatric: She has a normal mood and affect. Her speech is normal and behavior is normal. Thought content normal.    ED Course  Procedures (including critical care time) Labs Review Labs Reviewed  CBC WITH DIFFERENTIAL - Abnormal; Notable for the following:    WBC 18.4 (*)    RDW 18.2 (*)    Neutrophils Relative % 83 (*)    Neutro Abs 15.4 (*)    Lymphocytes Relative 8 (*)    Monocytes Absolute 1.6 (*)    All other components within normal limits  COMPREHENSIVE METABOLIC PANEL - Abnormal; Notable for the following:    Sodium 133 (*)    Potassium 2.9 (*)    Chloride 90 (*)    BUN 37 (*)    Creatinine, Ser 1.90 (*)    AST 261 (*)    ALT 189 (*)    Alkaline Phosphatase 118 (*)    GFR calc non Af Amer 22 (*)    GFR calc Af Amer 25 (*)    All other components within normal limits  URINALYSIS, ROUTINE W REFLEX MICROSCOPIC - Abnormal; Notable for the following:    Hgb urine dipstick TRACE (*)    Protein, ur 30 (*)    All other components within normal limits  PRO B NATRIURETIC PEPTIDE - Abnormal; Notable for the following:    Pro B Natriuretic peptide (BNP) 4206.0 (*)    All other components within normal limits  URINE MICROSCOPIC-ADD ON - Abnormal; Notable for the following:    Bacteria, UA FEW (*)    Casts HYALINE CASTS (*)    All other components within normal limits  COMPREHENSIVE METABOLIC PANEL - Abnormal; Notable for the following:    Sodium 134 (*)    Potassium 3.3 (*)    BUN 35 (*)    Creatinine, Ser 1.70 (*)    Calcium 8.3 (*)    Albumin 2.9 (*)    AST 219 (*)    ALT 178 (*)    GFR calc non Af Amer 25 (*)    GFR calc Af Amer 29 (*)    All other components within normal limits  URINE CULTURE  GI PATHOGEN  PANEL BY PCR, STOOL  TROPONIN I  OCCULT BLOOD X 1 CARD TO LAB, STOOL  LIPASE, BLOOD  MAGNESIUM  TSH  MAGNESIUM  PHOSPHORUS  CBC  URINALYSIS, ROUTINE W REFLEX MICROSCOPIC  GI PATHOGEN PANEL BY PCR, STOOL  COMPREHENSIVE METABOLIC PANEL   Imaging Review [place] Abdomen Complete  07/25/2013   CLINICAL DATA:  Abdominal pain  EXAM: ULTRASOUND ABDOMEN COMPLETE  COMPARISON:  None.  FINDINGS: Gallbladder:  Surgically absent.  Common bile duct:  Diameter: 15 mm in diameter at the mid CBD.  Liver:  No focal lesion identified. Within normal limits in parenchymal echogenicity. There is mild intrahepatic biliary ductal dilatation.  IVC:  No abnormality visualized.  Pancreas:  Visualized portion unremarkable.  Spleen:  Size and appearance within normal limits.  Right Kidney:  Length: 9.2 cm. Echogenicity within normal limits. No mass or hydronephrosis visualized.  Left Kidney:  Length: 9.5 cm. Echogenicity within normal limits. No mass or hydronephrosis visualized.  Abdominal aorta:  No aneurysm visualized.  Other findings:  None.  IMPRESSION: 1. Prior cholecystectomy. Mild common bile duct dilatation measuring up to 15 mm at the mid common bile duct and tapering distally. There is no choledocholithiasis. There is mild intrahepatic biliary ductal dilatation. If there is further clinical concern recommend MRCP. The overall appearance is similar to the prior CT of the abdomen dated 10/30/2012.   Electronically Signed   By: Elige Ko   On: 07/25/2013 14:51   Dg Abd Acute W/chest  07/25/2013   CLINICAL DATA:  Diarrhea  EXAM: ACUTE ABDOMEN SERIES (ABDOMEN 2 VIEW & CHEST 1 VIEW)  COMPARISON:  None.  FINDINGS: There is no evidence of dilated bowel loops or free intraperitoneal air. There are a few colonic air-fluid levels as can be seen with diarrhea. No radiopaque calculi or other significant radiographic abnormality is seen. There is a dual lead cardiac pacer. Stable cardiomegaly. Bilateral chronic bronchitic changes.  No focal consolidation, pleural effusion or pneumothorax. Prior ORIF of the left proximal humerus. .  IMPRESSION: Few colonic air-fluid levels as can be seen with diarrhea.   Electronically Signed   By: Elige Ko   On: 07/25/2013 13:10     EKG Interpretation   Date/Time:  Thursday July 25 2013 12:13:45 EDT Ventricular Rate:  100 PR Interval:    QRS Duration: 92 QT Interval:  296 QTC Calculation: 381 R Axis:   38 Text Interpretation:  Atrial fibrillation with premature ventricular or  aberrantly conducted complexes Marked ST abnormality, possible inferior  subendocardial injury Abnormal ECG No significant change since last  tracing Confirmed by Eyleen Rawlinson  MD, Keyari Kleeman ([id number]) on 07/25/2013  12:19:44 PM      MDM   Final diagnoses:  Dehydration  Abdominal pain    Patient presents to the ER for evaluation of nausea, vomiting and diarrhea. She reports that her vomiting has been controlled with Zofran prescribed by the primary care doctor, but she continues to have significant episodes of watery diarrhea. She has been feeling extremely weak since this started. Patient does have a history of congestive heart failure, cannot tolerate large amounts of IV fluids here, was gently hydrated. Lab work reveals a leukocytosis, unclear etiology at this time. She has moderate hypokalemia which will be initially corrected with by mouth potassium. The patient was found to have elevated LFTs. She does have a history of cholecystectomy. Ultrasound was performed to evaluate for possible ductal dilatation, there is some dilatation, but unchanged from previous CT. Etiology of the elevated LFTs at this time is unclear, possibly infectious (doubt cholangitis and bacterial infection, but possible, viral also possible) or secondary to medications. She will require further management of her diarrhea. She has had multiple episodes here in the ER. She has been using Imodium without improvement. Stool was sent for C.  difficile and bacterial antigens, still pending. Patient will be hospitalized at [facility] for further management.    Gilda Crease, MD 07/26/13 [id number]

## 2013-07-25 NOTE — ED Notes (Signed)
Discharge condition note incorrect

## 2013-07-25 NOTE — H&P (Signed)
[facility] History and Physical  [name] ZOX:[id number] DOB: Jul 11, 1919 DOA: 07/25/2013  Referring physician: ED physician PCP: Carollee Herter, MD   Chief Complaint: diarrhea   HPI:  Pt is 80 yo pleasant female with HTN, hypothyroidism, CKD stage I - II, who was transferred from [facility] emergency department to [facility] hospital for further evaluation of generalized weakness, non bloody vomiting and diarrhea that started 2 days prior to this admission. Pt denies similar events in the past, no known sick contacts or exposures, no fevers, chills, no specific urinary concerns, no specific focal neurological symptoms. Pt also denies chest pain , shortness of breath.   In ED, pt noted to be hemodynamically stable,   Assessment and Plan: Active Problems:   N/VD - unclear etiology at this time - will obtain GI stool panel by PCR - provide supportive care with IVF, analgesia, antiemetics as needed   Acute on chronic renal failure - likely secondary to pre renal etiology from dehydration and use of Lasix - will hold lasix for today and will place on gentle hydration for 8 hours  - repeat BMP in AM   Grade II diastolic CHF, per last 2 D ECHO 03/2013 - clinically dry on exam - hold Lasix for today and possibly resume in AM based on clinical status  - weight today 143 lbs which is apparently her baseline    Hypokalemia - secondary to diarrhea, vomiting  - will supplement and will check A1C - repeat BMP in AM   Transaminitis  - mild CBD dilatation on Abd [place], pt has no tenderness on exam - repeat CMET In AM, consider MRCP as per recommendation, may also need GI consult in AM   Leukocytosis  - secondary to acute illness, no abx provided as no clear infectious source noted - supportive care and repeat CBC in AM  Radiological Exams on Admission:  US Abdomen Complete   07/25/2013  Prior cholecystectomy. Mild common bile duct dilatation measuring up to 15 mm at the mid common bile  duct and tapering distally. There is no choledocholithiasis. There is mild intrahepatic biliary ductal dilatation. If there is further clinical concern recommend MRCP.   Dg Abd Acute W/chest   07/25/2013  Few colonic air-fluid levels as can be seen with diarrhea.     Code Status: Full Family Communication: Pt at bedside Disposition Plan: Admit for further evaluation    Review of Systems:  Constitutional: Negative for fever, chills. Negative for diaphoresis.  HENT: Negative for hearing loss, ear pain, nosebleeds, congestion, sore throat, neck pain, tinnitus and ear discharge.   Eyes: Negative for blurred vision, double vision, photophobia, pain, discharge and redness.  Respiratory: Negative for cough, hemoptysis, sputum production, shortness of breath, wheezing and stridor.   Cardiovascular: Negative for chest pain, palpitations, orthopnea, claudication and leg swelling.  Gastrointestinal: Negative for heartburn, constipation, blood in stool and melena.  Genitourinary: Negative for dysuria, urgency, frequency, hematuria and flank pain.  Musculoskeletal: Negative for myalgias, back pain, joint pain and falls.  Skin: Negative for itching and rash.  Neurological: Negative for tingling, tremors, sensory change, speech change, and headaches.  Endo/Heme/Allergies: Negative for environmental allergies and polydipsia. Does not bruise/bleed easily.  Psychiatric/Behavioral: Negative for suicidal ideas. The patient is not nervous/anxious.      Past Medical History  Diagnosis Date  . Hypertension   . Osteoporosis   . Atrial fib/flutter, transient   . CHF (congestive heart failure)   . Vertigo   . Chronic renal  insufficiency   . Hypothyroidism   . Arthritis   . Anxiety   . On home oxygen therapy     uses O2 @ 2l/m nasally bedtime  . Tachy-brady syndrome      treated with amiodarone and permanent DDD pacemaker, 1993    Past Surgical History  Procedure Laterality Date  . Knee arthroscopy       right  . Insert / replace / remove pacemaker    . Total hip arthroplasty      right  . Esophagogastroduodenoscopy N/A 11/26/2012    Procedure: ESOPHAGOGASTRODUODENOSCOPY (EGD);  Surgeon: Charna Elizabeth, MD;  Location: [facility] ENDOSCOPY;  Service: Endoscopy;  Laterality: N/A;  . Joint replacement      rt hip  . Shoulder surgery    . Esophagogastroduodenoscopy N/A 03/08/2013    Procedure: ESOPHAGOGASTRODUODENOSCOPY (EGD);  Surgeon: Theda Belfast, MD;  Location: [facility];  Service: Endoscopy;  Laterality: N/A;  . Pacemaker insertion      Social History:  reports that she has never smoked. She has never used smokeless tobacco. She reports that she does not drink alcohol or use illicit drugs.  Allergies  Allergen Reactions  . Quinidine Other (See Comments)    Skin peels  . Tiazac [Diltiazem Hcl] Hives    "don't remember how bad the reaction was" (04/24/2012)  . Warfarin Sodium Other (See Comments)    GI bleed  . Percocet [Oxycodone-Acetaminophen] Other (See Comments)    confusion  . Promethazine Hcl Other (See Comments)    confusion  . Amiodarone   . Procainamide Other (See Comments)    Lupus reaction  . Septra [Sulfamethoxazole W/Trimethoprim (Co-Trimoxazole)] Other (See Comments)    Dizziness and tremor "don't remember how bad the reaction was" (04/24/2012)  . Warfarin And Related   . Amoxicillin Rash  . Ciprofloxacin Rash  . Polocaine [Mepivacaine Hcl] Palpitations    Family History  Problem Relation Age of Onset  . Hypertension Mother   . Heart disease Mother     Heart failure (vague history)    Medication Sig  Hydrocodone-Acetaminophen 2.5-325 MG  Take by mouth.  Loperamide HCl (IMODIUM PO) Take by mouth.  Ondansetron HCl (ZOFRAN PO) Take by mouth.  acetaminophen  650 MG CR tablet Take 650 mg every 8 (eight) hours as needed for pain.  amLODipine (NORVASC) 5 MG tablet Take 5 mg by mouth daily.  aspirin 81 MG tablet Take 81 mg by mouth daily.  atenolol (TENORMIN)  25 MG tablet Take 1 tablet (25 mg total) by mouth 2 (two) times daily.  benzonatate (TESSALON) 100 MG capsule BID PRN cough   buPROPion 150 MG 24 hr tablet Take 300 mg by mouth daily.  chlorpheniramine-HYDROcodone (TUSSIONEX PENNKINETIC ER) 10-8 MG/5ML LQCR Take 2.5-5 mLs by mouth every 12 (twelve) hours as needed for cough.  estradiol (ESTRACE) 0.1 MG/GM vaginal cream Place 2 g vaginally at bedtime.   furosemide (LASIX) 80 MG tablet Take 120 mg by mouth daily.  hyoscyamine (LEVSIN/SL) 0.125 MG SL tablet Place 1 tablet (0.125 mg total) under the tongue every 4 (four) hours as needed for cramping.  lactose free nutrition (BOOST PLUS) LIQD Take 237 mLs by mouth 2 (two) times daily between meals.   levothyroxine (SYNTHROID, LEVOTHROID) 88 MCG tablet Take 1 tablet (88 mcg total) by mouth daily before breakfast.  loratadine (CLARITIN) 10 MG tablet Take 10 mg by mouth as needed for allergies.  magnesium hydroxide (MILK OF MAGNESIA) 400 MG/5ML suspension Take 30 mLs by mouth daily as  needed for constipation.  metolazone (ZAROXOLYN) 2.5 MG tablet Take 2.5 mg by mouth once a week.   Multiple Vitamins-Minerals (ICAPS PO) Take 1 capsule by mouth daily.  NON FORMULARY Take 1 tablet by mouth daily. Ultra Flora IB once a day  omeprazole (PRILOSEC) 20 MG capsule Take 40 mg by mouth every morning.  potassium chloride 10 MEQ tablet Take 1 tablet (10 mEq total) by mouth daily.  traMADol (ULTRAM) 50 MG tablet Take 1 tablet  by mouth at bedtime as needed. Bid-tid  trimethoprim (TRIMPEX) 100 MG tablet Take 100 mg by mouth daily.    Physical Exam: Filed Vitals:   07/25/13 1147 07/25/13 1451 07/25/13 1558 07/25/13 1700  BP: 150/78 122/53 128/59 126/76  Pulse: 94 92 88 88  Temp: 98.4 F (36.9 C)   98.2 F (36.8 C)  TempSrc: Oral   Oral  Resp: 20  25   Height:    5\' 5"  (1.651 m)  Weight:    65.227 kg (143 lb 12.8 oz)  SpO2: 91%  96% 92%    Physical Exam  Constitutional: Appears well-developed and  well-nourished. No distress.  HENT: Normocephalic. External right and left ear normal. Dry MM Eyes: Conjunctivae and EOM are normal. PERRLA, no scleral icterus.  Neck: Normal ROM. Neck supple. No JVD. No tracheal deviation. No thyromegaly.  CVS: RRR, S1/S2 +, no murmurs, no gallops, no carotid bruit.  Pulmonary: Effort and breath sounds normal, no stridor, rhonchi, wheezes, rales.  Abdominal: Soft. BS +,  no distension, no tenderness, rebound or guarding.  Musculoskeletal: Normal range of motion. No edema and no tenderness.  Lymphadenopathy: No lymphadenopathy noted, cervical, inguinal. Neuro: Alert. Normal reflexes, muscle tone coordination. No cranial nerve deficit. Skin: Skin is warm and dry. No rash noted. Not diaphoretic. No erythema. No pallor.  Psychiatric: Normal mood and affect. Behavior, judgment, thought content normal.   Labs on Admission:  Basic Metabolic Panel:  Recent Labs Lab 07/25/13 1215 07/25/13 1522  NA 133*  --   K 2.9*  --   CL 90*  --   CO2 24  --   GLUCOSE 91  --   BUN 37*  --   CREATININE 1.90*  --   CALCIUM 9.1  --   MG  --  2.1   Liver Function Tests:  Recent Labs Lab 07/25/13 1215  AST 261*  ALT 189*  ALKPHOS 118*  BILITOT 0.9  PROT 7.8  ALBUMIN 3.9    Recent Labs Lab 07/25/13 1215  LIPASE 49   CBC:  Recent Labs Lab 07/25/13 1215  WBC 18.4*  NEUTROABS 15.4*  HGB 14.1  HCT 43.0  MCV 96.0  PLT 212   Cardiac Enzymes:  Recent Labs Lab 07/25/13 1215  TROPONINI <0.30   EKG: Normal sinus rhythm, no ST/T wave changes  Debbora PrestoMAGICK-Christophr Calix, MD  [facility] Pager [phone]  If 7PM-7AM, please contact night-coverage [email or web address] Password [email or web address] 07/25/2013, 6:05 PM

## 2013-07-25 NOTE — Telephone Encounter (Signed)
tsd  °

## 2013-07-25 NOTE — ED Notes (Signed)
Attempt to call report to 5W, RN unable.  Will attempt again in 10 minutes.

## 2013-07-25 NOTE — Progress Notes (Signed)
Addendum -- GI panel positive for rotavirus   Debbora PrestoMAGICK-Tammi Boulier, MD  [facility] Pager [phone]  If 7PM-7AM, please contact night-coverage [email or web address] Password [email or web address]

## 2013-07-25 NOTE — ED Notes (Signed)
Diarrhea since Tuesday, has been taking imodium, took 2 at 9:15am,  Was given medicine for n/v which stopped her nausea, no fever

## 2013-07-26 ENCOUNTER — Inpatient Hospital Stay (HOSPITAL_COMMUNITY): Payer: MEDICARE

## 2013-07-26 ENCOUNTER — Encounter (HOSPITAL_COMMUNITY): Payer: Self-pay | Admitting: General Practice

## 2013-07-26 DIAGNOSIS — R42 Dizziness and giddiness: Secondary | ICD-10-CM

## 2013-07-26 DIAGNOSIS — A080 Rotaviral enteritis: Secondary | ICD-10-CM | POA: Diagnosis present

## 2013-07-26 DIAGNOSIS — I4891 Unspecified atrial fibrillation: Secondary | ICD-10-CM

## 2013-07-26 DIAGNOSIS — R197 Diarrhea, unspecified: Secondary | ICD-10-CM

## 2013-07-26 LAB — COMPREHENSIVE METABOLIC PANEL
ALK PHOS: 83 U/L (ref 39–117)
ALT: 147 U/L — ABNORMAL HIGH (ref 0–35)
AST: 156 U/L — AB (ref 0–37)
Albumin: 2.7 g/dL — ABNORMAL LOW (ref 3.5–5.2)
BILIRUBIN TOTAL: 0.6 mg/dL (ref 0.3–1.2)
BUN: 33 mg/dL — ABNORMAL HIGH (ref 6–23)
CALCIUM: 8.4 mg/dL (ref 8.4–10.5)
CHLORIDE: 102 meq/L (ref 96–112)
CO2: 20 mEq/L (ref 19–32)
Creatinine, Ser: 1.52 mg/dL — ABNORMAL HIGH (ref 0.50–1.10)
GFR calc non Af Amer: 29 mL/min — ABNORMAL LOW (ref >90)
GFR, EST AFRICAN AMERICAN: 33 mL/min — AB (ref >90)
GLUCOSE: 67 mg/dL — AB (ref 70–99)
POTASSIUM: 3.5 meq/L — AB (ref 3.7–5.3)
SODIUM: 139 meq/L (ref 137–147)
Total Protein: 5.7 g/dL — ABNORMAL LOW (ref 6.0–8.3)

## 2013-07-26 LAB — CBC
HCT: 36.2 % (ref 36.0–46.0)
HEMOGLOBIN: 11.9 g/dL — AB (ref 12.0–15.0)
MCH: 31.5 pg (ref 26.0–34.0)
MCHC: 32.9 g/dL (ref 30.0–36.0)
MCV: 95.8 fL (ref 78.0–100.0)
Platelets: 166 10*3/uL (ref 150–400)
RBC: 3.78 MIL/uL — ABNORMAL LOW (ref 3.87–5.11)
RDW: 18.2 % — ABNORMAL HIGH (ref 11.5–15.5)
WBC: 10.3 10*3/uL (ref 4.0–10.5)

## 2013-07-26 LAB — TSH: TSH: 1.483 u[IU]/mL (ref 0.350–4.500)

## 2013-07-26 MED ORDER — LOPERAMIDE HCL 2 MG PO CAPS
2.0000 mg | ORAL_CAPSULE | ORAL | Status: DC | PRN
  Administered 2013-07-26 – 2013-07-27 (×5): 2 mg via ORAL
  Filled 2013-07-26 (×5): qty 1

## 2013-07-26 MED ORDER — GI COCKTAIL ~~LOC~~
30.0000 mL | Freq: Three times a day (TID) | ORAL | Status: DC | PRN
  Filled 2013-07-26: qty 30

## 2013-07-26 MED ORDER — LACTATED RINGERS IV SOLN
INTRAVENOUS | Status: DC
  Administered 2013-07-26: 10:00:00 via INTRAVENOUS

## 2013-07-26 MED ORDER — SODIUM CHLORIDE 0.9 % IV SOLN
INTRAVENOUS | Status: DC
  Administered 2013-07-26: 21:00:00 via INTRAVENOUS

## 2013-07-26 MED ORDER — POTASSIUM CHLORIDE CRYS ER 20 MEQ PO TBCR
40.0000 meq | EXTENDED_RELEASE_TABLET | Freq: Every day | ORAL | Status: DC
  Administered 2013-07-26 – 2013-07-28 (×3): 40 meq via ORAL
  Filled 2013-07-26 (×4): qty 2

## 2013-07-26 MED ORDER — MORPHINE SULFATE 2 MG/ML IJ SOLN: 1.0000 mg | INTRAMUSCULAR | Status: DC | PRN

## 2013-07-26 MED ORDER — IOHEXOL 300 MG/ML  SOLN
25.0000 mL | INTRAMUSCULAR | Status: AC
  Administered 2013-07-26 (×2): 25 mL via ORAL

## 2013-07-26 MED ORDER — MORPHINE SULFATE 2 MG/ML IJ SOLN
INTRAMUSCULAR | Status: AC
  Administered 2013-07-26: 2 mg via INTRAVENOUS
  Filled 2013-07-26: qty 1

## 2013-07-26 NOTE — Telephone Encounter (Signed)
lm

## 2013-07-26 NOTE — Progress Notes (Addendum)
PROGRESS NOTE  [name] QMV:[id number] DOB: [birth year] DOA: 07/25/2013 PCP: Carollee HerterLALONDE,JOHN CHARLES, MD  Subjective: 80 y.o. Female with history of HTN, hypothyroidism, CKD Stage I-II admitted with rotavirus gastroenteritis and secondary dehydration. Today pt is reporting chest pain, likely from repositioning after waking up, but claims the gastroenteritis has resolved.    Assessment/Plan:  Diarrhea/Nausea/Vomiting   -Pt reported 2 day history of nausea, vomiting and diarrhea upon admission. GI pathogen panel reveals this is 2/2 to rotavirus infection. C. Diff panel was negative.  -Today pt has reported no further N/V/D since admission. -Continue with IVF, analgesia, and antiemetics prn.  Dehydration -2/2 to vomiting and diarrhea.  -IVF and supportive measures started yesterday. -Continue to monitor.  Chest Pain - likely 2/2 to repositioning this morning, per nurse. Pain is reproducible with palpation.  - Stat EKG showed no acute changes since last EKG done 07/01/13. IV Morphine administered. - Continue to monitor vitals.  Acute on chronic renal failure  - likely secondary to pre renal etiology from dehydration and use of Lasix  - lasix held and placed on gentle hydration for 8 hours  - repeat BMP in AM   Grade II diastolic CHF, per last 2 D ECHO 03/2013  - Chronic diastolic CHF, no evidence of decompensation.  - hold Lasix for today and possibly resume tomorrow based on clinical status  - weight today 143 lbs which is apparently her baseline   Hypokalemia  - secondary to diarrhea, vomiting  - Potassium supplemented yesterday, will give an additional 40 mEq today. - repeat BMP in AM   Transaminitis  - Mild CBD dilatation on Abd US, pt has no tenderness on exam  - Repeat CMET shows ALT/AST trending down - MRCP can not be done because of pacemaker, I will obtain CT with oral contrast only.  Leukocytosis  - resolved 3/20. - secondary to acute illness, no abx  provided as etiology is viral  - supportive care and repeat CBC in AM     DVT Prophylaxis:  Lovenox  Code Status: full Family Communication: patient states understanding Disposition Plan: remain inpatient   Consultants:  none  Procedures:  EKG 07/26/2013  Antibiotics:  none  Objective: Filed Vitals:   07/25/13 1558 07/25/13 1700 07/25/13 2149 07/26/13 0605  BP: 128/59 126/76 104/65 101/63  Pulse: 88 88 90 81  Temp:  98.2 F (36.8 C) 98.4 F (36.9 C) 98.1 F (36.7 C)  TempSrc:  Oral Oral Oral  Resp: 25  16 16   Height:  5\' 5"  (1.651 m)    Weight:  65.227 kg (143 lb 12.8 oz)    SpO2: 96% 92% 97% 93%   No intake or output data in the 24 hours ending 07/26/13 0950 Filed Weights   07/25/13 1700  Weight: 65.227 kg (143 lb 12.8 oz)    Exam: General: Well developed, well nourished, in mild distress, appears stated age  HEENT:  No pharyngeal erythema or exudates, moist mucus membranes   Cardiovascular: RRR, S1 S2 auscultated, no rubs, murmurs or gallops. Sternum is tender to palpation.  Respiratory: Clear to auscultation bilaterally with equal chest rise  Abdomen: Soft, nontender, nondistended, + bowel sounds  Extremities: warm dry without cyanosis clubbing or edema.  Neuro: AAOx3, cranial nerves grossly intact.   Skin: Without rashes exudates or nodules.   Psych: Normal affect and demeanor with intact judgement and insight   Data Reviewed: Basic Metabolic Panel:  Recent Labs Lab 07/25/13 1215 07/25/13 1522 07/25/13 2146 07/26/13 0500  NA 133*  --  134* 139  K 2.9*  --  3.3* 3.5*  CL 90*  --  97 102  CO2 24  --  21 20  GLUCOSE 91  --  72 67*  BUN 37*  --  35* 33*  CREATININE 1.90*  --  1.70* 1.52*  CALCIUM 9.1  --  8.3* 8.4  MG  --  2.1 2.1  --   PHOS  --   --  2.7  --    Liver Function Tests:  Recent Labs Lab 07/25/13 1215 07/25/13 2146 07/26/13 0500  AST 261* 219* 156*  ALT 189* 178* 147*  ALKPHOS 118* 87 83  BILITOT 0.9 0.6 0.6  PROT  7.8 6.1 5.7*  ALBUMIN 3.9 2.9* 2.7*    Recent Labs Lab 07/25/13 1215  LIPASE 49   CBC:  Recent Labs Lab 07/25/13 1215 07/26/13 0706  WBC 18.4* 10.3  NEUTROABS 15.4*  --   HGB 14.1 11.9*  HCT 43.0 36.2  MCV 96.0 95.8  PLT 212 166   Cardiac Enzymes:  Recent Labs Lab 07/25/13 1215  TROPONINI <0.30   BNP (last 3 results)  Recent Labs  04/03/13 0614 05/30/13 2210 07/25/13 1215  PROBNP 2567.0* 1894.0* 4206.0*     Studies: US Abdomen Complete  07/25/2013   CLINICAL DATA:  Abdominal pain  EXAM: ULTRASOUND ABDOMEN COMPLETE  COMPARISON:  None.  FINDINGS: Gallbladder:  Surgically absent.  Common bile duct:  Diameter: 15 mm in diameter at the mid CBD.  Liver:  No focal lesion identified. Within normal limits in parenchymal echogenicity. There is mild intrahepatic biliary ductal dilatation.  IVC:  No abnormality visualized.  Pancreas:  Visualized portion unremarkable.  Spleen:  Size and appearance within normal limits.  Right Kidney:  Length: 9.2 cm. Echogenicity within normal limits. No mass or hydronephrosis visualized.  Left Kidney:  Length: 9.5 cm. Echogenicity within normal limits. No mass or hydronephrosis visualized.  Abdominal aorta:  No aneurysm visualized.  Other findings:  None.  IMPRESSION: 1. Prior cholecystectomy. Mild common bile duct dilatation measuring up to 15 mm at the mid common bile duct and tapering distally. There is no choledocholithiasis. There is mild intrahepatic biliary ductal dilatation. If there is further clinical concern recommend MRCP. The overall appearance is similar to the prior CT of the abdomen dated 10/30/2012.   Electronically Signed   By: Elige Ko   On: 07/25/2013 14:51   Dg Abd Acute W/chest  07/25/2013   CLINICAL DATA:  Diarrhea  EXAM: ACUTE ABDOMEN SERIES (ABDOMEN 2 VIEW & CHEST 1 VIEW)  COMPARISON:  None.  FINDINGS: There is no evidence of dilated bowel loops or free intraperitoneal air. There are a few colonic air-fluid levels as can  be seen with diarrhea. No radiopaque calculi or other significant radiographic abnormality is seen. There is a dual lead cardiac pacer. Stable cardiomegaly. Bilateral chronic bronchitic changes. No focal consolidation, pleural effusion or pneumothorax. Prior ORIF of the left proximal humerus. .  IMPRESSION: Few colonic air-fluid levels as can be seen with diarrhea.   Electronically Signed   By: Elige Ko   On: 07/25/2013 13:10    Scheduled Meds: . amLODipine  5 mg Oral Daily  . aspirin EC  81 mg Oral Daily  . atenolol  25 mg Oral BID  . buPROPion  300 mg Oral Daily  . enoxaparin (LOVENOX) injection  30 mg Subcutaneous Q24H  . estradiol  1.5 g Vaginal QHS  . lactose free nutrition  237 mL Oral BID BM  . levothyroxine  88 mcg Oral QAC breakfast  . pantoprazole  40 mg Oral Daily  . potassium chloride  10 mEq Oral Daily  . sodium chloride  3 mL Intravenous Q12H  . trimethoprim  100 mg Oral Daily   Continuous Infusions: . lactated ringers      Curt Bears, PA-S     [facility] Pager [phone]. If 7PM-7AM, please contact night-coverage at [email or web address], password [facility] 07/26/2013, 9:50 AM  LOS: 1 day    Addendum  Patient seen and examined, chart and data base reviewed.  I agree with the above assessment and plan.  For full details please see Mrs. [employer], PA-S note.  I reviewed and addended the above note as needed.   Clint Lipps, MD [facility] Pager: [phone] 07/26/2013, 11:17 AM

## 2013-07-27 DIAGNOSIS — N183 Chronic kidney disease, stage 3 unspecified: Secondary | ICD-10-CM

## 2013-07-27 DIAGNOSIS — A080 Rotaviral enteritis: Principal | ICD-10-CM

## 2013-07-27 LAB — CBC
HCT: 36.1 % (ref 36.0–46.0)
HEMOGLOBIN: 11.8 g/dL — AB (ref 12.0–15.0)
MCH: 31.4 pg (ref 26.0–34.0)
MCHC: 32.7 g/dL (ref 30.0–36.0)
MCV: 96 fL (ref 78.0–100.0)
Platelets: 176 10*3/uL (ref 150–400)
RBC: 3.76 MIL/uL — AB (ref 3.87–5.11)
RDW: 18.2 % — ABNORMAL HIGH (ref 11.5–15.5)
WBC: 8.9 10*3/uL (ref 4.0–10.5)

## 2013-07-27 LAB — COMPREHENSIVE METABOLIC PANEL
ALBUMIN: 2.8 g/dL — AB (ref 3.5–5.2)
ALT: 99 U/L — ABNORMAL HIGH (ref 0–35)
AST: 73 U/L — AB (ref 0–37)
Alkaline Phosphatase: 75 U/L (ref 39–117)
BILIRUBIN TOTAL: 0.5 mg/dL (ref 0.3–1.2)
BUN: 32 mg/dL — ABNORMAL HIGH (ref 6–23)
CALCIUM: 8.8 mg/dL (ref 8.4–10.5)
CHLORIDE: 106 meq/L (ref 96–112)
CO2: 16 mEq/L — ABNORMAL LOW (ref 19–32)
Creatinine, Ser: 1.52 mg/dL — ABNORMAL HIGH (ref 0.50–1.10)
GFR calc Af Amer: 33 mL/min — ABNORMAL LOW (ref >90)
GFR calc non Af Amer: 29 mL/min — ABNORMAL LOW (ref >90)
Glucose, Bld: 70 mg/dL (ref 70–99)
Potassium: 4 mEq/L (ref 3.7–5.3)
Sodium: 138 mEq/L (ref 137–147)
TOTAL PROTEIN: 5.8 g/dL — AB (ref 6.0–8.3)

## 2013-07-27 NOTE — Progress Notes (Signed)
PROGRESS NOTE  [name] ZOX:[id number] DOB: [birth year] DOA: 07/25/2013 PCP: Carollee Herter, MD  Subjective: 80 y.o. Female with history of HTN, hypothyroidism, CKD Stage I-II admitted with rotavirus gastroenteritis and secondary dehydration.  Feels better, did not wake up for diarrhea at night.  Assessment/Plan:  Diarrhea/Nausea/Vomiting   -Pt reported 2 day history of nausea, vomiting and diarrhea upon admission. GI pathogen panel reveals this is 2/2 to rotavirus infection. C. Diff panel was negative.  -Today pt has reported no further N/V/D since admission. -Continue with IVF, analgesia, and antiemetics prn. -She is improving.  Dehydration -2/2 to vomiting and diarrhea.  -IVF and supportive measures started yesterday. -Continue to monitor.  Chest Pain - likely 2/2 to repositioning this morning, per nurse. Pain is reproducible with palpation.  - Stat EKG showed no acute changes since last EKG done 07/01/13. IV Morphine administered. - Continue to monitor vitals.  Chronic renal failure  - Patient has chronic kidney disease, she is at baseline of about 1.5. - lasix held and placed on gentle hydration for 8 hours  - repeat BMP in AM   Grade II diastolic CHF, per last 2 D ECHO 03/2013  - Chronic diastolic CHF, no evidence of decompensation.  - hold Lasix for today and possibly resume tomorrow based on clinical status  - weight today 143 lbs which is apparently her baseline, I will DC the IV fluids.   Hypokalemia  - secondary to diarrhea, vomiting  - Potassium supplemented yesterday, will give an additional 40 mEq today. - repeat BMP in AM   Transaminitis  - Mild CBD dilatation on Abd [place], pt has no tenderness on exam  - Repeat CMET shows ALT/AST trending down - CT scan done for transaminitis showed gastric wall thickening, could be gastritis versus malignancy. - Patient will need GI followup.  Leukocytosis  - resolved 3/20. - secondary to acute illness, no  abx provided as etiology is viral  - supportive care and repeat CBC in AM     DVT Prophylaxis:  Lovenox  Code Status: full Family Communication: patient states understanding Disposition Plan: remain inpatient   Consultants:  none  Procedures:  EKG 07/26/2013  Antibiotics:  none  Objective: Filed Vitals:   07/26/13 0937 07/26/13 2121 07/27/13 0552 07/27/13 0944  BP: 125/52 138/85 116/65 130/70  Pulse: 95 92 93 88  Temp:  98.5 F (36.9 C) 97.9 F (36.6 C)   TempSrc:  Oral Oral   Resp:  18 18   Height:      Weight:      SpO2:  100% 100%     Intake/Output Summary (Last 24 hours) at 07/27/13 1012 Last data filed at 07/27/13 0955  Gross per 24 hour  Intake 953.67 ml  Output      0 ml  Net 953.67 ml   Filed Weights   07/25/13 1700  Weight: 65.227 kg (143 lb 12.8 oz)    Exam: General: Well developed, well nourished, in mild distress, appears stated age  HEENT:  No pharyngeal erythema or exudates, moist mucus membranes   Cardiovascular: RRR, S1 S2 auscultated, no rubs, murmurs or gallops. Sternum is tender to palpation.  Respiratory: Clear to auscultation bilaterally with equal chest rise  Abdomen: Soft, nontender, nondistended, + bowel sounds  Extremities: warm dry without cyanosis clubbing or edema.  Neuro: AAOx3, cranial nerves grossly intact.   Skin: Without rashes exudates or nodules.   Psych: Normal affect and demeanor with intact judgement and insight   Data  Reviewed: Basic Metabolic Panel:  Recent Labs Lab 07/25/13 1215 07/25/13 1522 07/25/13 2146 07/26/13 0500 07/27/13 0626  NA 133*  --  134* 139 138  K 2.9*  --  3.3* 3.5* 4.0  CL 90*  --  97 102 106  CO2 24  --  21 20 16*  GLUCOSE 91  --  72 67* 70  BUN 37*  --  35* 33* 32*  CREATININE 1.90*  --  1.70* 1.52* 1.52*  CALCIUM 9.1  --  8.3* 8.4 8.8  MG  --  2.1 2.1  --   --   PHOS  --   --  2.7  --   --    Liver Function Tests:  Recent Labs Lab 07/25/13 1215 07/25/13 2146  07/26/13 0500 07/27/13 0626  AST 261* 219* 156* 73*  ALT 189* 178* 147* 99*  ALKPHOS 118* 87 83 75  BILITOT 0.9 0.6 0.6 0.5  PROT 7.8 6.1 5.7* 5.8*  ALBUMIN 3.9 2.9* 2.7* 2.8*    Recent Labs Lab 07/25/13 1215  LIPASE 49   CBC:  Recent Labs Lab 07/25/13 1215 07/26/13 0706 07/27/13 0626  WBC 18.4* 10.3 8.9  NEUTROABS 15.4*  --   --   HGB 14.1 11.9* 11.8*  HCT 43.0 36.2 36.1  MCV 96.0 95.8 96.0  PLT 212 166 176   Cardiac Enzymes:  Recent Labs Lab 07/25/13 1215  TROPONINI <0.30   BNP (last 3 results)  Recent Labs  04/03/13 0614 05/30/13 2210 07/25/13 1215  PROBNP 2567.0* 1894.0* 4206.0*     Studies: Ct Abdomen Pelvis Wo Contrast  07/26/2013   CLINICAL DATA:  Copious diarrhea, history hypertension, atrial fibrillation, CHF, chronic renal insufficiency, GERD  EXAM: CT ABDOMEN AND PELVIS WITHOUT CONTRAST  TECHNIQUE: Multidetector CT imaging of the abdomen and pelvis was performed following the standard protocol without intravenous contrast. Sagittal and coronal MPR images reconstructed from axial data set. Patient drank dilute oral contrast for exam  COMPARISON:  10/30/2012  FINDINGS: Pacemaker leads right atrium and right ventricle.  Minimal basilar atelectasis versus scarring.  Gallbladder surgically absent.  Slight prominence of the pancreatic head unchanged from previous study.  Hypodensities within the pancreatic head correspond to a mildly dilated distal CBD and pancreatic duct, similar to prior studies.  Scattered atherosclerotic calcifications without aneurysm.  Minimal intrahepatic biliary dilatation.  Remainder of liver, spleen, kidneys, and adrenal glands normal appearance.  Sigmoid diverticulosis without evidence of diverticulitis.  Bowel loops otherwise normal appearance.  Wall thickening at gastric fundus and cardia unchanged from previous exam, cannot exclude tumor.  Unremarkable bladder, ureters, appendix, uterus and adnexae.  No mass, adenopathy, free  fluid, or inflammatory process.  Orthopedic hardware right hip with scattered beam hardening artifacts.  Osseous demineralization with scattered degenerative disc disease changes thoracolumbar spine.  IMPRESSION: Focal wall thickening of the gastric cardia/fundus, unable to exclude tumor, recommend endoscopic assessment to exclude gastric neoplasm.  Distal colonic diverticulosis.  No other intra-abdominal or intrapelvic abnormalities.  Specifically no cause for diarrhea identified.   Electronically Signed   By: Ulyses SouthwardMark  Boles M.D.   On: 07/26/2013 17:31   [place] Abdomen Complete  07/25/2013   CLINICAL DATA:  Abdominal pain  EXAM: ULTRASOUND ABDOMEN COMPLETE  COMPARISON:  None.  FINDINGS: Gallbladder:  Surgically absent.  Common bile duct:  Diameter: 15 mm in diameter at the mid CBD.  Liver:  No focal lesion identified. Within normal limits in parenchymal echogenicity. There is mild intrahepatic biliary ductal dilatation.  IVC:  No  abnormality visualized.  Pancreas:  Visualized portion unremarkable.  Spleen:  Size and appearance within normal limits.  Right Kidney:  Length: 9.2 cm. Echogenicity within normal limits. No mass or hydronephrosis visualized.  Left Kidney:  Length: 9.5 cm. Echogenicity within normal limits. No mass or hydronephrosis visualized.  Abdominal aorta:  No aneurysm visualized.  Other findings:  None.  IMPRESSION: 1. Prior cholecystectomy. Mild common bile duct dilatation measuring up to 15 mm at the mid common bile duct and tapering distally. There is no choledocholithiasis. There is mild intrahepatic biliary ductal dilatation. If there is further clinical concern recommend MRCP. The overall appearance is similar to the prior CT of the abdomen dated 10/30/2012.   Electronically Signed   By: Elige Ko   On: 07/25/2013 14:51   Dg Abd Acute W/chest  07/25/2013   CLINICAL DATA:  Diarrhea  EXAM: ACUTE ABDOMEN SERIES (ABDOMEN 2 VIEW & CHEST 1 VIEW)  COMPARISON:  None.  FINDINGS: There is no evidence  of dilated bowel loops or free intraperitoneal air. There are a few colonic air-fluid levels as can be seen with diarrhea. No radiopaque calculi or other significant radiographic abnormality is seen. There is a dual lead cardiac pacer. Stable cardiomegaly. Bilateral chronic bronchitic changes. No focal consolidation, pleural effusion or pneumothorax. Prior ORIF of the left proximal humerus. .  IMPRESSION: Few colonic air-fluid levels as can be seen with diarrhea.   Electronically Signed   By: Elige Ko   On: 07/25/2013 13:10    Scheduled Meds: . amLODipine  5 mg Oral Daily  . aspirin EC  81 mg Oral Daily  . atenolol  25 mg Oral BID  . buPROPion  300 mg Oral Daily  . enoxaparin (LOVENOX) injection  30 mg Subcutaneous Q24H  . estradiol  1.5 g Vaginal QHS  . lactose free nutrition  237 mL Oral BID BM  . levothyroxine  88 mcg Oral QAC breakfast  . pantoprazole  40 mg Oral Daily  . potassium chloride SA  40 mEq Oral Daily  . sodium chloride  3 mL Intravenous Q12H  . trimethoprim  100 mg Oral Daily   Continuous Infusions: . sodium chloride 50 mL/hr at 07/26/13 2046    [facility], PA-S     [facility] Pager [phone]. If 7PM-7AM, please contact night-coverage at [email or web address], password [facility] 07/27/2013, 10:12 AM  LOS: 2 days    Addendum  Patient seen and examined, chart and data base reviewed.  I agree with the above assessment and plan.  For full details please see Mrs. [employer], PA-S note.  I reviewed and addended the above note as needed.   Clint Lipps, MD [facility] Pager: [phone] 07/27/2013, 10:12 AM

## 2013-07-27 NOTE — Progress Notes (Signed)
Patient's SP02 98% on 3L while in bed resting. Sp02 89% on room air while in bed resting. Patient ambulated Sp02 88%-90% while ambulating on room air. Pt tolerated ambulation in hall well.

## 2013-07-28 DIAGNOSIS — M549 Dorsalgia, unspecified: Secondary | ICD-10-CM

## 2013-07-28 DIAGNOSIS — M129 Arthropathy, unspecified: Secondary | ICD-10-CM

## 2013-07-28 DIAGNOSIS — R630 Anorexia: Secondary | ICD-10-CM

## 2013-07-28 LAB — COMPREHENSIVE METABOLIC PANEL
ALT: 73 U/L — AB (ref 0–35)
AST: 56 U/L — ABNORMAL HIGH (ref 0–37)
Albumin: 2.8 g/dL — ABNORMAL LOW (ref 3.5–5.2)
Alkaline Phosphatase: 77 U/L (ref 39–117)
BUN: 25 mg/dL — ABNORMAL HIGH (ref 6–23)
CO2: 15 meq/L — AB (ref 19–32)
CREATININE: 1.33 mg/dL — AB (ref 0.50–1.10)
Calcium: 9.1 mg/dL (ref 8.4–10.5)
Chloride: 108 mEq/L (ref 96–112)
GFR calc Af Amer: 39 mL/min — ABNORMAL LOW (ref >90)
GFR, EST NON AFRICAN AMERICAN: 34 mL/min — AB (ref >90)
GLUCOSE: 71 mg/dL (ref 70–99)
Potassium: 4.1 mEq/L (ref 3.7–5.3)
Sodium: 139 mEq/L (ref 137–147)
Total Bilirubin: 0.5 mg/dL (ref 0.3–1.2)
Total Protein: 5.8 g/dL — ABNORMAL LOW (ref 6.0–8.3)

## 2013-07-28 MED ORDER — FUROSEMIDE 10 MG/ML IJ SOLN
40.0000 mg | Freq: Once | INTRAMUSCULAR | Status: AC
  Administered 2013-07-28: 40 mg via INTRAVENOUS
  Filled 2013-07-28: qty 4

## 2013-07-28 NOTE — Discharge Summary (Signed)
Physician Discharge Summary  [name]:[id number] DOB: 05-12-1919 DOA: 07/25/2013  PCP: Carollee Herter, MD  Admit date: 07/25/2013 Discharge date: 07/28/2013  Time spent: 40 minutes  Recommendations for Outpatient Follow-up:  1. Followup with primary care physician one week. 2. Check CMP in one week, check bicarbonate, renal function and AST/ALT.  Discharge Diagnoses:  Principal Problem:   Rotaviral enteritis Active Problems:   Abdominal pain   Dehydration   Diarrhea   Discharge Condition: Stable  Diet recommendation: Heart healthy  Filed Weights   07/25/13 1700  Weight: 65.227 kg (143 lb 12.8 oz)    History of present illness:  Pt is 80 yo pleasant female with HTN, hypothyroidism, CKD stage I - II, who was transferred from [facility] emergency department to [facility] hospital for further evaluation of generalized weakness, non bloody vomiting and diarrhea that started 2 days prior to this admission. Pt denies similar events in the past, no known sick contacts or exposures, no fevers, chills, no specific urinary concerns, no specific focal neurological symptoms. Pt also denies chest pain , shortness of breath.  In ED, pt noted to be hemodynamically stable,    Hospital Course:   Rotavirus gastroenteritis  -Pt reported 2 day history of nausea, vomiting and diarrhea upon admission. GI pathogen panel reveals this is 2/2 to rotavirus infection. C. Diff panel was negative.  -Nausea, vomiting and diarrhea resolved.  -Treated with with IVF, analgesia, and antiemetics prn.   Dehydration  -2/2 to vomiting and diarrhea.  -IVF and supportive measures started yesterday.   Chest Pain  - likely 2/2 to repositioning this morning, per nurse. Pain is reproducible with palpation.  - Stat EKG showed no acute changes since last EKG done 07/01/13. IV Morphine administered.  - Continue to monitor vitals.   Acute on chronic renal failure  - likely secondary to pre renal etiology from  dehydration and use of Lasix  - lasix held and placed on gentle hydration for 8 hours  - Acidosis with bicarbonate of 15, non-anion gap, likely secondary to renal failure/diarrhea. - Check BMP in 1 week for followup.  Grade II diastolic CHF, per last 2 D ECHO 03/2013  - Chronic diastolic CHF, no evidence of decompensation.  - hold Lasix for today and possibly resume tomorrow based on clinical status  - weight today 143 lbs which is apparently her baseline   Hypokalemia  - secondary to diarrhea, vomiting  - Potassium repleted with oral supplements.  Transaminitis  - Mild CBD dilatation on Abd [place], pt has no tenderness on exam  - Repeat CMET shows ALT/AST trending down  - MRCP can not be done because of pacemaker. -CT of abdomen and pelvis did not show acute findings.  Leukocytosis  - resolved 3/20.  - secondary to acute illness, no abx provided as etiology is viral  - supportive care and repeat CBC in AM   Procedures:  None  Consultations:  None  Discharge Exam: Filed Vitals:   07/28/13 0948  BP: 146/72  Pulse: 93  Temp:   Resp:    General: Alert and awake, oriented x3, not in any acute distress. HEENT: anicteric sclera, pupils reactive to light and accommodation, EOMI CVS: S1-S2 clear, no murmur rubs or gallops Chest: clear to auscultation bilaterally, no wheezing, rales or rhonchi Abdomen: soft nontender, nondistended, normal bowel sounds, no organomegaly Extremities: no cyanosis, clubbing or edema noted bilaterally Neuro: Cranial nerves II-XII intact, no focal neurological deficits  Discharge Instructions  Discharge Orders  Future Appointments Provider Department Dept Phone   08/19/2013 1:30 PM Lesleigh Noe, MD [facility] [phone]   08/28/2013 2:15 PM Hillis Range, MD [facility] [phone]   Future Orders Complete By Expires   Diet - low sodium heart healthy  As directed    Increase activity slowly  As  directed        Medication List         amLODipine 5 MG tablet  Commonly known as:  NORVASC  Take 5 mg by mouth daily.     aspirin EC 81 MG tablet  Take 81 mg by mouth daily.     atenolol 25 MG tablet  Commonly known as:  TENORMIN  Take 25 mg by mouth 2 (two) times daily. 10am and 6pm     buPROPion 300 MG 24 hr tablet  Commonly known as:  WELLBUTRIN XL  Take 300 mg by mouth daily.     estradiol 0.1 MG/GM vaginal cream  Commonly known as:  ESTRACE  Place 2 g vaginally at bedtime.     furosemide 80 MG tablet  Commonly known as:  LASIX  Take 120 mg by mouth daily.     ICAPS PO  Take 1 capsule by mouth daily.     lactose free nutrition Liqd  Take 237 mLs by mouth See admin instructions. Take 237 mls every morning, may take an additional 237 mls in the evening depending on meal intake     levothyroxine 88 MCG tablet  Commonly known as:  SYNTHROID, LEVOTHROID  Take 1 tablet (88 mcg total) by mouth daily before breakfast.     loperamide 2 MG tablet  Commonly known as:  IMODIUM A-D  Take 2-4 mg by mouth as needed for diarrhea or loose stools. Take 2 tablets at first loose stool, then take 1 tablet as needed for more diarrhea     metolazone 2.5 MG tablet  Commonly known as:  ZAROXOLYN  Take 2.5 mg by mouth once a week. Take 30 minutes before Lasix on Mondays     NON FORMULARY  Take 1 tablet by mouth daily. Ultra Flora IB once a day     omeprazole 20 MG capsule  Commonly known as:  PRILOSEC  Take 40 mg by mouth daily.     ondansetron 4 MG tablet  Commonly known as:  ZOFRAN  Take 4 mg by mouth every 6 (six) hours as needed for nausea or vomiting.     potassium chloride 10 MEQ tablet  Commonly known as:  K-DUR,KLOR-CON  Take 1 tablet (10 mEq total) by mouth daily.     traMADol 50 MG tablet  Commonly known as:  ULTRAM  Take 50 mg by mouth daily as needed (pain).     trimethoprim 100 MG tablet  Commonly known as:  TRIMPEX  Take 100 mg by mouth daily.      TYLENOL ARTHRITIS PAIN 650 MG CR tablet  Generic drug:  acetaminophen  Take 650 mg by mouth 2 (two) times daily.     Vitamin D3 2000 UNITS Tabs  Take 2,000 Units by mouth daily.       Allergies  Allergen Reactions  . Quinidine Other (See Comments)    Skin peels  . Tiazac [Diltiazem Hcl] Hives    "don't remember how bad the reaction was" (04/24/2012)  . Warfarin Sodium Other (See Comments)    GI bleed  . Percocet [Oxycodone-Acetaminophen] Other (See Comments)    confusion  .  Promethazine Hcl Other (See Comments)    confusion  . Amiodarone Other (See Comments)    Lung problem per daughter  . Procainamide Other (See Comments)    Lupus reaction  . Warfarin And Related Other (See Comments)    Abdominal bleed  . Amoxicillin Rash  . Ciprofloxacin Rash  . Polocaine [Mepivacaine Hcl] Palpitations  . Septra [Sulfamethoxazole W/Trimethoprim (Co-Trimoxazole)] Rash and Other (See Comments)    Dizziness and tremor "don't remember how bad the reaction was" (04/24/2012)       Follow-up Information   Follow up with Carollee HerterLALONDE,JOHN CHARLES, MD In 1 week.   Specialty:  Family Medicine   Contact information:   [address] [place] [address] [phone]        The results of significant diagnostics from this hospitalization (including imaging, microbiology, ancillary and laboratory) are listed below for reference.    Significant Diagnostic Studies: Ct Abdomen Pelvis Wo Contrast  07/26/2013   CLINICAL DATA:  Copious diarrhea, history hypertension, atrial fibrillation, CHF, chronic renal insufficiency, GERD  EXAM: CT ABDOMEN AND PELVIS WITHOUT CONTRAST  TECHNIQUE: Multidetector CT imaging of the abdomen and pelvis was performed following the standard protocol without intravenous contrast. Sagittal and coronal MPR images reconstructed from axial data set. Patient drank dilute oral contrast for exam  COMPARISON:  10/30/2012  FINDINGS: Pacemaker leads right atrium and right  ventricle.  Minimal basilar atelectasis versus scarring.  Gallbladder surgically absent.  Slight prominence of the pancreatic head unchanged from previous study.  Hypodensities within the pancreatic head correspond to a mildly dilated distal CBD and pancreatic duct, similar to prior studies.  Scattered atherosclerotic calcifications without aneurysm.  Minimal intrahepatic biliary dilatation.  Remainder of liver, spleen, kidneys, and adrenal glands normal appearance.  Sigmoid diverticulosis without evidence of diverticulitis.  Bowel loops otherwise normal appearance.  Wall thickening at gastric fundus and cardia unchanged from previous exam, cannot exclude tumor.  Unremarkable bladder, ureters, appendix, uterus and adnexae.  No mass, adenopathy, free fluid, or inflammatory process.  Orthopedic hardware right hip with scattered beam hardening artifacts.  Osseous demineralization with scattered degenerative disc disease changes thoracolumbar spine.  IMPRESSION: Focal wall thickening of the gastric cardia/fundus, unable to exclude tumor, recommend endoscopic assessment to exclude gastric neoplasm.  Distal colonic diverticulosis.  No other intra-abdominal or intrapelvic abnormalities.  Specifically no cause for diarrhea identified.   Electronically Signed   By: Ulyses SouthwardMark  Boles M.D.   On: 07/26/2013 17:31   [place] Abdomen Complete  07/25/2013   CLINICAL DATA:  Abdominal pain  EXAM: ULTRASOUND ABDOMEN COMPLETE  COMPARISON:  None.  FINDINGS: Gallbladder:  Surgically absent.  Common bile duct:  Diameter: 15 mm in diameter at the mid CBD.  Liver:  No focal lesion identified. Within normal limits in parenchymal echogenicity. There is mild intrahepatic biliary ductal dilatation.  IVC:  No abnormality visualized.  Pancreas:  Visualized portion unremarkable.  Spleen:  Size and appearance within normal limits.  Right Kidney:  Length: 9.2 cm. Echogenicity within normal limits. No mass or hydronephrosis visualized.  Left Kidney:  Length:  9.5 cm. Echogenicity within normal limits. No mass or hydronephrosis visualized.  Abdominal aorta:  No aneurysm visualized.  Other findings:  None.  IMPRESSION: 1. Prior cholecystectomy. Mild common bile duct dilatation measuring up to 15 mm at the mid common bile duct and tapering distally. There is no choledocholithiasis. There is mild intrahepatic biliary ductal dilatation. If there is further clinical concern recommend MRCP. The overall appearance is similar to the prior CT  of the abdomen dated 10/30/2012.   Electronically Signed   By: Elige Ko   On: 07/25/2013 14:51   Dg Abd Acute W/chest  07/25/2013   CLINICAL DATA:  Diarrhea  EXAM: ACUTE ABDOMEN SERIES (ABDOMEN 2 VIEW & CHEST 1 VIEW)  COMPARISON:  None.  FINDINGS: There is no evidence of dilated bowel loops or free intraperitoneal air. There are a few colonic air-fluid levels as can be seen with diarrhea. No radiopaque calculi or other significant radiographic abnormality is seen. There is a dual lead cardiac pacer. Stable cardiomegaly. Bilateral chronic bronchitic changes. No focal consolidation, pleural effusion or pneumothorax. Prior ORIF of the left proximal humerus. .  IMPRESSION: Few colonic air-fluid levels as can be seen with diarrhea.   Electronically Signed   By: Elige Ko   On: 07/25/2013 13:10    Microbiology: No results found for this or any previous visit (from the past 240 hour(s)).   Labs: Basic Metabolic Panel:  Recent Labs Lab 07/25/13 1215 07/25/13 1522 07/25/13 2146 07/26/13 0500 07/27/13 0626 07/28/13 0713  NA 133*  --  134* 139 138 139  K 2.9*  --  3.3* 3.5* 4.0 4.1  CL 90*  --  97 102 106 108  CO2 24  --  21 20 16* 15*  GLUCOSE 91  --  72 67* 70 71  BUN 37*  --  35* 33* 32* 25*  CREATININE 1.90*  --  1.70* 1.52* 1.52* 1.33*  CALCIUM 9.1  --  8.3* 8.4 8.8 9.1  MG  --  2.1 2.1  --   --   --   PHOS  --   --  2.7  --   --   --    Liver Function Tests:  Recent Labs Lab 07/25/13 1215 07/25/13 2146  07/26/13 0500 07/27/13 0626 07/28/13 0713  AST 261* 219* 156* 73* 56*  ALT 189* 178* 147* 99* 73*  ALKPHOS 118* 87 83 75 77  BILITOT 0.9 0.6 0.6 0.5 0.5  PROT 7.8 6.1 5.7* 5.8* 5.8*  ALBUMIN 3.9 2.9* 2.7* 2.8* 2.8*    Recent Labs Lab 07/25/13 1215  LIPASE 49   No results found for this basename: AMMONIA,  in the last 168 hours CBC:  Recent Labs Lab 07/25/13 1215 07/26/13 0706 07/27/13 0626  WBC 18.4* 10.3 8.9  NEUTROABS 15.4*  --   --   HGB 14.1 11.9* 11.8*  HCT 43.0 36.2 36.1  MCV 96.0 95.8 96.0  PLT 212 166 176   Cardiac Enzymes:  Recent Labs Lab 07/25/13 1215  TROPONINI <0.30   BNP: BNP (last 3 results)  Recent Labs  04/03/13 0614 05/30/13 2210 07/25/13 1215  PROBNP 2567.0* 1894.0* 4206.0*   CBG: No results found for this basename: GLUCAP,  in the last 168 hours     Signed:  Azlin Zilberman A  [facility] 07/28/2013, 10:50 AM

## 2013-07-29 ENCOUNTER — Telehealth: Payer: Self-pay | Admitting: Internal Medicine

## 2013-07-29 NOTE — Telephone Encounter (Signed)
[facility]ami[facility]Chris [facility] a report on [facility]. Pt BP is 100/60, HR 100, Resp 20-24 and temp 98.7. Pt has lost 5.8 pounds and having diarrhea with golden liquid stools and is very dehydrated. LM on [name] VM to return my call regardin

## 2013-07-29 NOTE — Telephone Encounter (Signed)
Ramiro Harvesthris Kennedy from [facility] called stating that pt was discharged over the weekend from the hospital and they like to home health within 24 hours after being discharged. Dr. Verdis PrimeHenry Smith is out of the office today so they can not get an ok from him. But they are calling here to see if you would ok for her to start home health again. Call Christ @ [phone]

## 2013-08-05 ENCOUNTER — Telehealth: Payer: Self-pay | Admitting: Internal Medicine

## 2013-08-05 ENCOUNTER — Telehealth: Payer: Self-pay

## 2013-08-05 NOTE — Telephone Encounter (Signed)
[facility]Bar[facility] and stated that she would like to have a recertification order to continue seeing pt at home for another 60days. Britta MccreedyBarbara stated that she went in and saw pt on Friday and pt was very dehydrated and not feeling well, she states she advised pt to intake plenty of fluids. Britta MccreedyBarbara can be reached at [phone]. Please advise.Marland Kitchen..Marland Kitchen

## 2013-08-05 NOTE — Telephone Encounter (Signed)
Barbara from [facility] called and stated that she would like to have a recertification order to continue seeing pt at home for another 60days. Barbara stated that she went in and saw pt on Friday and pt was very dehydrated and not feeling well, she states she advised pt to intake plenty of fluids. Barbara can be reached at [phone]. Please advise...  

## 2013-08-06 ENCOUNTER — Encounter: Payer: Self-pay | Admitting: Family Medicine

## 2013-08-06 ENCOUNTER — Ambulatory Visit (INDEPENDENT_AMBULATORY_CARE_PROVIDER_SITE_OTHER): Payer: MEDICARE | Admitting: Family Medicine

## 2013-08-06 VITALS — BP 110/70 | HR 64 | Wt 144.0 lb

## 2013-08-06 DIAGNOSIS — A080 Rotaviral enteritis: Secondary | ICD-10-CM

## 2013-08-06 LAB — CBC WITH DIFFERENTIAL/PLATELET
Basophils Absolute: 0 10*3/uL (ref 0.0–0.1)
Basophils Relative: 0 % (ref 0–1)
EOS PCT: 1 % (ref 0–5)
Eosinophils Absolute: 0.1 10*3/uL (ref 0.0–0.7)
HCT: 41.1 % (ref 36.0–46.0)
Hemoglobin: 13.3 g/dL (ref 12.0–15.0)
LYMPHS ABS: 2 10*3/uL (ref 0.7–4.0)
LYMPHS PCT: 22 % (ref 12–46)
MCH: 30.4 pg (ref 26.0–34.0)
MCHC: 32.4 g/dL (ref 30.0–36.0)
MCV: 94.1 fL (ref 78.0–100.0)
MONOS PCT: 6 % (ref 3–12)
Monocytes Absolute: 0.5 10*3/uL (ref 0.1–1.0)
Neutro Abs: 6.5 10*3/uL (ref 1.7–7.7)
Neutrophils Relative %: 71 % (ref 43–77)
Platelets: 340 10*3/uL (ref 150–400)
RBC: 4.37 MIL/uL (ref 3.87–5.11)
RDW: 16.8 % — AB (ref 11.5–15.5)
WBC: 9.1 10*3/uL (ref 4.0–10.5)

## 2013-08-06 LAB — COMPREHENSIVE METABOLIC PANEL
ALBUMIN: 3.7 g/dL (ref 3.5–5.2)
ALT: 21 U/L (ref 0–35)
AST: 21 U/L (ref 0–37)
Alkaline Phosphatase: 75 U/L (ref 39–117)
BUN: 22 mg/dL (ref 6–23)
CALCIUM: 9.1 mg/dL (ref 8.4–10.5)
CHLORIDE: 105 meq/L (ref 96–112)
CO2: 25 meq/L (ref 19–32)
CREATININE: 1.65 mg/dL — AB (ref 0.50–1.10)
GLUCOSE: 140 mg/dL — AB (ref 70–99)
Potassium: 3.8 mEq/L (ref 3.5–5.3)
Sodium: 140 mEq/L (ref 135–145)
Total Bilirubin: 0.3 mg/dL (ref 0.2–1.2)
Total Protein: 6.1 g/dL (ref 6.0–8.3)

## 2013-08-06 NOTE — Progress Notes (Signed)
   Subjective:    Patient ID: [name] Reason, female    DOB: December 16, 1919, 80 y.o.   MRN: [id number]  HPI She is here for followup after recent hospitalization and treatment for gastroenteritis it turned out to be rotavirus. She continues to complain of weakness. She also is still having loose stools. She has no other complaints. She is scheduled to see cardiology in the near future.   Review of Systems     Objective:   Physical Exam Alert and in no distress. Abdominal exam shows decreased bowel sounds without masses or tenderness. The hospital discharge summary and record were reviewed.      Assessment & Plan:  Viral gastroenteritis due to rotaviruses - Plan: CBC with Differential, Comprehensive metabolic panel

## 2013-08-15 ENCOUNTER — Ambulatory Visit (INDEPENDENT_AMBULATORY_CARE_PROVIDER_SITE_OTHER): Payer: MEDICARE | Admitting: Family Medicine

## 2013-08-15 ENCOUNTER — Encounter: Payer: Self-pay | Admitting: Family Medicine

## 2013-08-15 VITALS — BP 110/90 | Wt 144.0 lb

## 2013-08-15 DIAGNOSIS — S92912A Unspecified fracture of left toe(s), initial encounter for closed fracture: Secondary | ICD-10-CM

## 2013-08-15 DIAGNOSIS — Z87440 Personal history of urinary (tract) infections: Secondary | ICD-10-CM

## 2013-08-15 DIAGNOSIS — I5032 Chronic diastolic (congestive) heart failure: Secondary | ICD-10-CM

## 2013-08-15 DIAGNOSIS — S92919A Unspecified fracture of unspecified toe(s), initial encounter for closed fracture: Secondary | ICD-10-CM

## 2013-08-15 NOTE — Progress Notes (Signed)
   Subjective:    Patient ID: [name], female    DOB: [birth year], 80 y.o.   MRN: [id number]  HPI She is here for multiple concerns. She complains of left fourth and fifth toe pain started at night. She does not give a history of any injury to this. She also has difficulty with bilateral foot swelling. She does have a history of diastolic dysfunction and presently is taking 120 of Lasix in the morning and has been taking 80 in the evening. Her weight is around 140 pounds. Her daughter also thinks that her mental status is slightly changed and is concerned over possibility of UTI causing this which it has done in the past.   Review of Systems     Objective:   Physical Exam Like discoloration is noted in the fourth and fifth toes with pain especially with motion of the fourth toe. X-ray does show a fracture.       Assessment & Plan:  Toe fracture, left  History of UTI  Chronic diastolic heart failure  recommend they continue with the 120 in the morning and 80 at night and be aware that a pound or 2 different is all that is needed. Prescription written for wooden shoe which she is to wear for several weeks.

## 2013-08-15 NOTE — Patient Instructions (Signed)
Use the wooden shoe for a couple weeks.

## 2013-08-16 ENCOUNTER — Encounter: Payer: Self-pay | Admitting: Internal Medicine

## 2013-08-16 ENCOUNTER — Ambulatory Visit (INDEPENDENT_AMBULATORY_CARE_PROVIDER_SITE_OTHER): Payer: MEDICARE | Admitting: Internal Medicine

## 2013-08-16 VITALS — BP 107/53 | HR 93 | Ht 62.0 in | Wt 144.0 lb

## 2013-08-16 DIAGNOSIS — I4891 Unspecified atrial fibrillation: Secondary | ICD-10-CM

## 2013-08-16 DIAGNOSIS — Z950 Presence of cardiac pacemaker: Secondary | ICD-10-CM

## 2013-08-16 DIAGNOSIS — I10 Essential (primary) hypertension: Secondary | ICD-10-CM

## 2013-08-16 DIAGNOSIS — I495 Sick sinus syndrome: Secondary | ICD-10-CM | POA: Insufficient documentation

## 2013-08-16 LAB — MDC_IDC_ENUM_SESS_TYPE_INCLINIC
Battery Impedance: 2000 Ohm
Date Time Interrogation Session: 20150410160213
Implantable Pulse Generator Model: 5826
Implantable Pulse Generator Serial Number: 1904874
Lead Channel Impedance Value: 522 Ohm
Lead Channel Setting Pacing Amplitude: 2.5 V
Lead Channel Setting Pacing Pulse Width: 0.4 ms
Lead Channel Setting Sensing Sensitivity: 1.5 mV
MDC IDC MSMT BATTERY VOLTAGE: 2.79 V
MDC IDC MSMT LEADCHNL RV PACING THRESHOLD AMPLITUDE: 0.75 V
MDC IDC MSMT LEADCHNL RV PACING THRESHOLD PULSEWIDTH: 0.4 ms
MDC IDC MSMT LEADCHNL RV SENSING INTR AMPL: 4.9 mV

## 2013-08-16 NOTE — Patient Instructions (Signed)
Your physician wants you to follow-up in: 12 months with Dr Allred You will receive a reminder letter in the mail two months in advance. If you don't receive a letter, please call our office to schedule the follow-up appointment.  

## 2013-08-16 NOTE — Progress Notes (Signed)
Danielle Herter, MD: Primary Cardiologist:  Dr Cherene Julian is a 80 y.o. female with a h/o bradycardia/ tachycardia syndrome sp PPM (SJM) by Dr Amil Amen who presents today to establish care in the Electrophysiology device clinic.   The patient reports doing very well since having a pacemaker implanted and remains very active despite her age.  She reports some leg weakness which limits her activity.  Today, she  denies symptoms of palpitations, chest pain,  lower extremity edema (above baseline), dizziness, presyncope, syncope, or neurologic sequela.  She has chronic SOB for which she is on home O2. The patientis tolerating medications without difficulties and is otherwise without complaint today.   Past Medical History  Diagnosis Date  . Essential hypertension     controlled  . Osteoporosis   . Persistent atrial fibrillation     probably permanent  . Vertigo   . Chronic renal insufficiency   . Hypothyroidism   . Arthritis   . Anxiety   . On home oxygen therapy     uses O2 @ 2l/m nasally bedtime  . Tachy-brady syndrome      treated with amiodarone and permanent DDD pacemaker, 1993  . GERD (gastroesophageal reflux disease)   . Atrial flutter   . Encounter for long-term (current) use of other medications     prior bleeding with coumadin, presently on asa  . Chronic diastolic heart failure   . Coronary atherosclerosis of unspecified type of vessel, native or graft     asymptomatic  . Anemia, unspecified   . Depression   . Retroperitoneal bleed 2005    LIFE THREATENING spontaneous retroperitoneal bleed on coumadin therapy 2005  . Respiratory failure 07/2011   Past Surgical History  Procedure Laterality Date  . Knee arthroscopy Right   . Total hip arthroplasty Right   . Esophagogastroduodenoscopy N/A 11/26/2012    Procedure: ESOPHAGOGASTRODUODENOSCOPY (EGD);  Surgeon: Charna Elizabeth, MD;  Location: [facility];  Service: Endoscopy;  Laterality: N/A;  . Joint  replacement      rt hip  . Shoulder surgery    . Esophagogastroduodenoscopy N/A 03/08/2013    Procedure: ESOPHAGOGASTRODUODENOSCOPY (EGD);  Surgeon: Theda Belfast, MD;  Location: [facility];  Service: Endoscopy;  Laterality: N/A;  . Pacemaker insertion    . Cholecystectomy  10/08/1997    PPM implanted by Dr Amil Amen for tachy/brady syndrome  . Pacemaker generator change  6/10/20087    generator change Zephyr XL DR by Dr Amil Amen    History   Social History  . Marital Status: Married    Spouse Name: N/A    Number of Children: N/A  . Years of Education: N/A   Occupational History  . Not on file.   Social History Main Topics  . Smoking status: Never Smoker   . Smokeless tobacco: Never Used  . Alcohol Use: No  . Drug Use: No  . Sexual Activity: Not Currently   Other Topics Concern  . Not on file   Social History Narrative   Lives at home near [employer].  Widowed.  Three daughters.      Family History  Problem Relation Age of Onset  . Hypertension Mother   . Heart disease Mother     Heart failure (vague history)    Allergies  Allergen Reactions  . Quinidine Other (See Comments)    Skin peels  . Tiazac [Diltiazem Hcl] Hives    "don't remember how bad the reaction was" (04/24/2012)  . Warfarin Sodium  Other (See Comments)    Life-threatening retroperitoneal bleed in 2005  . Percocet [Oxycodone-Acetaminophen] Other (See Comments)    confusion  . Promethazine Hcl Other (See Comments)    confusion  . Amiodarone Other (See Comments)    Lung problem per daughter  . Procainamide Other (See Comments)    Lupus reaction  . Warfarin And Related Other (See Comments)    Abdominal bleed  . Amoxicillin Rash  . Ciprofloxacin Rash  . Polocaine [Mepivacaine Hcl] Palpitations and Rash  . Septra [Sulfamethoxazole W/Trimethoprim (Co-Trimoxazole)] Rash and Other (See Comments)    Dizziness and tremor "don't remember how bad the reaction was" (04/24/2012)    Current  Outpatient Prescriptions  Medication Sig Dispense Refill  . acetaminophen (TYLENOL ARTHRITIS PAIN) 650 MG CR tablet Take 650 mg by mouth 2 (two) times daily.       Marland Kitchen amLODipine (NORVASC) 5 MG tablet Take 5 mg by mouth daily.      Marland Kitchen aspirin EC 81 MG tablet Take 81 mg by mouth daily.      Marland Kitchen atenolol (TENORMIN) 25 MG tablet Take 25 mg by mouth 2 (two) times daily. 10am and 6pm      . buPROPion (WELLBUTRIN XL) 300 MG 24 hr tablet Take 300 mg by mouth daily.      . Cholecalciferol (VITAMIN D3) 2000 UNITS TABS Take 2,000 Units by mouth daily.      Marland Kitchen estradiol (ESTRACE) 0.1 MG/GM vaginal cream Place 2 g vaginally at bedtime.       . furosemide (LASIX) 80 MG tablet Take 120 mg by mouth daily.      Marland Kitchen lactose free nutrition (BOOST PLUS) LIQD Take 237 mLs by mouth See admin instructions. Take 237 mls every morning, may take an additional 237 mls in the evening depending on meal intake      . levothyroxine (SYNTHROID, LEVOTHROID) 88 MCG tablet Take 1 tablet (88 mcg total) by mouth daily before breakfast.  90 tablet  0  . loperamide (IMODIUM A-D) 2 MG tablet Take 2-4 mg by mouth as needed for diarrhea or loose stools. Take 2 tablets at first loose stool, then take 1 tablet as needed for more diarrhea      . metolazone (ZAROXOLYN) 2.5 MG tablet Take 2.5 mg by mouth once a week. Take 30 minutes before Lasix on Mondays      . Multiple Vitamins-Minerals (ICAPS PO) Take 1 capsule by mouth daily.      . NON FORMULARY Take 1 tablet by mouth daily. Ultra Flora IB once a day      . omeprazole (PRILOSEC) 20 MG capsule Take 40 mg by mouth daily.       . ondansetron (ZOFRAN) 4 MG tablet Take 4 mg by mouth every 6 (six) hours as needed for nausea or vomiting.      . potassium chloride (K-DUR,KLOR-CON) 10 MEQ tablet Take 1 tablet (10 mEq total) by mouth daily.  90 tablet  3  . traMADol (ULTRAM) 50 MG tablet Take 50 mg by mouth daily as needed (pain).      Marland Kitchen trimethoprim (TRIMPEX) 100 MG tablet Take 100 mg by mouth daily.        No current facility-administered medications for this visit.    ROS- all systems are reviewed and negative except as per HPI  Physical Exam: Filed Vitals:   08/16/13 1518  BP: 107/53  Pulse: 93  Height: 5\' 2"  (1.575 m)  Weight: 144 lb (65.318 kg)  SpO2: 93%  GEN- The patient is elderly appearing, alert and oriented x 3 today.   Head- normocephalic, atraumatic Eyes-  Sclera clear, conjunctiva pink Ears- hearing intact Oropharynx- clear Neck- supple,  Lungs- Clear to ausculation bilaterally, normal work of breathing Chest- pacemaker pocket is well healed Heart- Regular rate and rhythm  GI- soft, NT, ND, + BS Extremities- no clubbing, cyanosis, +1 edema MS- diffuse muscle atrophy Skin- no rash or lesion Psych- euthymic mood, full affect Neuro- strength and sensation are intact  Pacemaker interrogation- reviewed in detail today,  See PACEART report  Assessment and Plan:  1. Tachy/brady syndrome She appears to have been in afib for more than a year I have therefore reprogrammed her device VVIR today. No other changes  2. afib She declines anticoagulation due to concerns of bleeding No changes today  3. HTN Stable No change required today  She will continue TTMs every 3 months Follow-up with Dr Katrinka BlazingSmith as scheduled Return to see Nehemiah SettleBrooke in the device clinic in 1 year

## 2013-08-19 ENCOUNTER — Ambulatory Visit (INDEPENDENT_AMBULATORY_CARE_PROVIDER_SITE_OTHER): Payer: MEDICARE | Admitting: Interventional Cardiology

## 2013-08-19 ENCOUNTER — Other Ambulatory Visit (INDEPENDENT_AMBULATORY_CARE_PROVIDER_SITE_OTHER): Payer: MEDICARE

## 2013-08-19 ENCOUNTER — Encounter: Payer: Self-pay | Admitting: Interventional Cardiology

## 2013-08-19 VITALS — BP 140/72 | HR 90 | Ht 65.0 in | Wt 144.1 lb

## 2013-08-19 DIAGNOSIS — I4891 Unspecified atrial fibrillation: Secondary | ICD-10-CM

## 2013-08-19 DIAGNOSIS — I10 Essential (primary) hypertension: Secondary | ICD-10-CM

## 2013-08-19 DIAGNOSIS — R350 Frequency of micturition: Secondary | ICD-10-CM

## 2013-08-19 DIAGNOSIS — I495 Sick sinus syndrome: Secondary | ICD-10-CM

## 2013-08-19 DIAGNOSIS — I5032 Chronic diastolic (congestive) heart failure: Secondary | ICD-10-CM

## 2013-08-19 LAB — POCT URINALYSIS DIPSTICK
BILIRUBIN UA: NEGATIVE
Blood, UA: NEGATIVE
Glucose, UA: NEGATIVE
KETONES UA: NEGATIVE
Nitrite, UA: NEGATIVE
PROTEIN UA: NEGATIVE
Urobilinogen, UA: NEGATIVE
pH, UA: 7

## 2013-08-19 NOTE — Patient Instructions (Signed)
Your physician recommends that you continue on your current medications as directed. Please refer to the Current Medication list given to you today.  Your physician wants you to follow-up in: 6 months with Dr.Smith You will receive a reminder letter in the mail two months in advance. If you don't receive a letter, please call our office to schedule the follow-up appointment.  

## 2013-08-19 NOTE — Progress Notes (Signed)
Patient ID: [name], female   DOB: 1919/10/02, 80 y.o.   MRN: [id number]    [address] [place], [place]  [address] Phone: [phone]) [phone] Fax:  [phone]  Date:  08/19/2013   ID:  Danielle Ducklene B Sweeny, DOB 1919/10/02, MRN [id number]  PCP:  Carollee HerterLALONDE,JOHN CHARLES, MD   ASSESSMENT:  1. Chronic diastolic heart failure stable with metolazone, maintaining stable weight 2. Chronic atrial fibrillation, with rate control 3. Elderly and frail 4. Unable to use anticoagulation therapy  PLAN:  1. Continue to follow weights adjust diuretic regimen as needed 2. Continue oxygen at night and when necessary during the day if O2 sats get lower than 93%  3. I encouraged the patient to increase caloric intake to maintain her muscle mass 3. Moderate tension knee-high support stockings    SUBJECTIVE: Danielle Ducklene B Scarano is a 80 y.o. female who is doing relatively well. Since addition of metolazone her weight has been relatively stable. Her daughter is a Engineer, civil (consulting)nurse and understands how to manipulate the diuretics to keep her weight relatively stable. She denies chest pain. She complains of fatigue and decreased energy. She sleeps okay. Appetite is poor.   Wt Readings from Last 3 Encounters:  08/19/13 144 lb 1.9 oz (65.372 kg)  08/16/13 144 lb (65.318 kg)  08/15/13 144 lb (65.318 kg)     Past Medical History  Diagnosis Date  . Essential hypertension     controlled  . Osteoporosis   . Persistent atrial fibrillation     probably permanent  . Vertigo   . Chronic renal insufficiency   . Hypothyroidism   . Arthritis   . Anxiety   . On home oxygen therapy     uses O2 @ 2l/m nasally bedtime  . Tachy-brady syndrome      treated with amiodarone and permanent DDD pacemaker, 1993  . GERD (gastroesophageal reflux disease)   . Atrial flutter   . Encounter for long-term (current) use of other medications     prior bleeding with coumadin, presently on asa  . Chronic diastolic heart failure    . Coronary atherosclerosis of unspecified type of vessel, native or graft     asymptomatic  . Anemia, unspecified   . Depression   . Retroperitoneal bleed 2005    LIFE THREATENING spontaneous retroperitoneal bleed on coumadin therapy 2005  . Respiratory failure 07/2011    Current Outpatient Prescriptions  Medication Sig Dispense Refill  . acetaminophen (TYLENOL ARTHRITIS PAIN) 650 MG CR tablet Take 650 mg by mouth 2 (two) times daily.       Marland Kitchen. amLODipine (NORVASC) 5 MG tablet Take 5 mg by mouth daily.      Marland Kitchen. aspirin EC 81 MG tablet Take 81 mg by mouth daily.      Marland Kitchen. atenolol (TENORMIN) 25 MG tablet Take 25 mg by mouth 2 (two) times daily. 10am and 6pm      . buPROPion (WELLBUTRIN XL) 300 MG 24 hr tablet Take 300 mg by mouth daily.      . Cholecalciferol (VITAMIN D3) 2000 UNITS TABS Take 2,000 Units by mouth daily.      Marland Kitchen. estradiol (ESTRACE) 0.1 MG/GM vaginal cream Place 2 g vaginally at bedtime.       . furosemide (LASIX) 80 MG tablet Take 120 mg by mouth daily.      Marland Kitchen. lactose free nutrition (BOOST PLUS) LIQD Take 237 mLs by mouth See admin instructions. Take 237 mls every morning, may take an additional  237 mls in the evening depending on meal intake      . levothyroxine (SYNTHROID, LEVOTHROID) 88 MCG tablet Take 1 tablet (88 mcg total) by mouth daily before breakfast.  90 tablet  0  . loperamide (IMODIUM A-D) 2 MG tablet Take 2-4 mg by mouth as needed for diarrhea or loose stools. Take 2 tablets at first loose stool, then take 1 tablet as needed for more diarrhea      . metolazone (ZAROXOLYN) 2.5 MG tablet Take 2.5 mg by mouth once a week. Take 30 minutes before Lasix on Mondays      . Multiple Vitamins-Minerals (ICAPS PO) Take 1 capsule by mouth daily.      . NON FORMULARY Take 1 tablet by mouth daily. Ultra Flora IB once a day      . omeprazole (PRILOSEC) 20 MG capsule Take 40 mg by mouth daily.       . ondansetron (ZOFRAN) 4 MG tablet Take 4 mg by mouth every 6 (six) hours as needed  for nausea or vomiting.      . potassium chloride (K-DUR,KLOR-CON) 10 MEQ tablet Take 1 tablet (10 mEq total) by mouth daily.  90 tablet  3  . traMADol (ULTRAM) 50 MG tablet Take 50 mg by mouth daily as needed (pain).      Marland Kitchen. trimethoprim (TRIMPEX) 100 MG tablet Take 100 mg by mouth daily.       No current facility-administered medications for this visit.    Allergies:    Allergies  Allergen Reactions  . Quinidine Other (See Comments)    Skin peels  . Tiazac [Diltiazem Hcl] Hives    "don't remember how bad the reaction was" (04/24/2012)  . Warfarin Sodium Other (See Comments)    Life-threatening retroperitoneal bleed in 2005  . Percocet [Oxycodone-Acetaminophen] Other (See Comments)    confusion  . Promethazine Hcl Other (See Comments)    confusion  . Amiodarone Other (See Comments)    Lung problem per daughter  . Procainamide Other (See Comments)    Lupus reaction  . Warfarin And Related Other (See Comments)    Abdominal bleed  . Amoxicillin Rash  . Ciprofloxacin Rash  . Polocaine [Mepivacaine Hcl] Palpitations and Rash  . Septra [Sulfamethoxazole W/Trimethoprim (Co-Trimoxazole)] Rash and Other (See Comments)    Dizziness and tremor "don't remember how bad the reaction was" (04/24/2012)    Social History:  The patient  reports that she has never smoked. She has never used smokeless tobacco. She reports that she does not drink alcohol or use illicit drugs.   ROS:  Please see the history of present illness.   Weight is been stable. She has not had syncope.   All other systems reviewed and negative.   OBJECTIVE: VS:  BP 140/72  Pulse 90  Ht 5\' 5"  (1.651 m)  Wt 144 lb 1.9 oz (65.372 kg)  BMI 23.98 kg/m2 Well nourished, well developed, in no acute distress, elderly and frail  HEENT: normal Neck: JVD moderate elevation. Carotid bruit absent  Cardiac:  normal S1, S2; IIRR; no murmur Lungs:  clear to auscultation bilaterally, no wheezing, rhonchi or rales Abd: soft,  nontender, no hepatomegaly Ext: Edema trace ankle edema. Pulses 2+  Skin: warm and dry Neuro:  CNs 2-12 intact, no focal abnormalities noted  EKG:  Not repeated       Signed, Darci NeedleHenry W. B. Smith III, MD 08/19/2013 1:48 PM

## 2013-08-26 ENCOUNTER — Encounter: Payer: Self-pay | Admitting: Internal Medicine

## 2013-08-28 ENCOUNTER — Encounter: Payer: MEDICARE | Admitting: Internal Medicine

## 2013-09-04 ENCOUNTER — Telehealth: Payer: Self-pay | Admitting: Interventional Cardiology

## 2013-09-04 NOTE — Telephone Encounter (Signed)
New message     Pt complaining when taken a deep breathe by her pacemaker.  Continue to have dizziness, due to metolazone2.5 mg

## 2013-09-06 ENCOUNTER — Telehealth: Payer: Self-pay | Admitting: Internal Medicine

## 2013-09-06 NOTE — Telephone Encounter (Signed)
[employer] services have been restored. Daughter aware of this and first ttm is scheduled for July.

## 2013-09-06 NOTE — Telephone Encounter (Signed)
New message     Talked to [place] at last appt---pacer box no longer works.  The company said she needed an enrollment from from us to get a new box because the old box had been cancelled

## 2013-09-26 ENCOUNTER — Other Ambulatory Visit: Payer: Self-pay | Admitting: Interventional Cardiology

## 2013-09-26 ENCOUNTER — Other Ambulatory Visit: Payer: Self-pay | Admitting: Family Medicine

## 2013-09-26 NOTE — Telephone Encounter (Signed)
Can you clarify which dose the patient should be taking? Thanks, MI

## 2013-11-26 ENCOUNTER — Encounter: Payer: Self-pay | Admitting: Medical

## 2013-11-26 ENCOUNTER — Ambulatory Visit (INDEPENDENT_AMBULATORY_CARE_PROVIDER_SITE_OTHER): Payer: MEDICARE | Admitting: Medical

## 2013-11-26 VITALS — BP 132/70 | HR 60 | Temp 97.5°F | Wt 142.0 lb

## 2013-11-26 DIAGNOSIS — R195 Other fecal abnormalities: Secondary | ICD-10-CM

## 2013-11-26 DIAGNOSIS — R109 Unspecified abdominal pain: Secondary | ICD-10-CM

## 2013-11-26 LAB — POCT URINALYSIS DIPSTICK
Bilirubin, UA: NEGATIVE
GLUCOSE UA: NEGATIVE
Ketones, UA: NEGATIVE
NITRITE UA: NEGATIVE
Protein, UA: NEGATIVE
Spec Grav, UA: <=1.005
Urobilinogen, UA: NEGATIVE
pH, UA: 7

## 2013-11-26 NOTE — Patient Instructions (Signed)
  Thank you for giving me the opportunity to serve you today.    Your diagnosis today includes: Encounter Diagnoses  Name Primary?  . Abdominal pain, unspecified site Yes  . Abnormal stools     Specific recommendations today include:  Cut back on acidic and spicy foods such as orange juice, tomatoes, citrus fruits, tomato based foods  Keep the diet more bland and consistent   Eat vegetables daily  Clean all fresh fruits and vegetables  Pain medications, big portions of food, meat and cheese can contribute to constipation  So hydrate well with water daily, eat some fiber daily in the diet (grains)  continue the probiotic daily  Short term she can use Pepto Bismol for upset stomach  For cramping/loose stool she can use the Hyocyamine tablets she has  For constipation, she can use a little milk of magnesium  If in the next few days she continues to have loose stools >3 times daily, collect the stool samples, and return for labs  Call back if not improving.

## 2013-11-26 NOTE — Progress Notes (Signed)
   Subjective:    Patient ID: [name], female    DOB: [birth year], 80 y.o.   MRN: [id number]  HPI  Patient is presenting lower abdominal pain for 1 month.  Here with care giver who helps with history and I did speak to her daughter via phone regarding her symptoms.  Patient states that the pain comes and goes throughout the day.  Caregiver notes in recent weeks, having 2-4 stools/BMs daily, sometimes loose, sometimes hard and constipated.  Urinates more frequently at night, no changes during the day. She has also had decreased appetite in the last 2 week.  She has been eating more tomatoes and citrus of late.   Prior to a month ago was having regularly daily BM solid formed.  this past week daily to 3+ BM daily.    Denies fevers, nausea, vomiting, blood in stool, dysuria, hematuria, confusion, myalgias, chest pain, sob. Has a history of UTIs, last one was this past Spring, resolved with antibiotics. Has tried Tramadol with some relief. Drinking up to 64 oz of water per day, limited due to her chronic diastolic HF. She is currently taking Lasix daily and Metolazone when her weight exceeds 144lbs, which they have had to do recently in the last 2 weeks.  No recent antibiotics.   No other aggravating or relieving factors.  No other c/o.  Review of Systems As in subjective.    Objective:   Physical Exam  BP 132/70  Pulse 60  Temp(Src) 97.5 F (36.4 C) (Oral)  Wt 142 lb (64.411 kg)   General appearance: alert, no distress, WD/WN, wincing intermittently Heart: RRR, normal S1, S2, no murmurs Lungs: CTA bilaterally, no wheezes, rhonchi, or rales Abdomen: +bs, soft, slight left mid abdomen tenderness, otherwise non tender, non distended, no masses, no hepatomegaly, no splenomegaly, no CVA tenderness Pulses: 2+ symmetric, upper and lower extremities, normal cap refill Ext: trace edema bilaterally      Assessment & Plan:   Encounter Diagnoses  Name Primary?  . Abdominal pain,  unspecified site Yes  . Abnormal stools    Discussed her symptoms, concern.   reviewed vital today, CT abdomen pelvis from March when she had similar but worse symptoms, reviewed labs from March.    Her daughter by phone states that every year at this time she tends to get similar symptoms after eating more tomatoes from the garden.  After further discussion, she apparently drinks a big serving of orange juice daily, eats several tomatoes daily, eats other citrus fruits, and some chocolate, some fried foods regularly.  We discussed possible cause. UA not all that significant today.  Discussed possibly getting some labs, but they decline today.  She will c/t probiotic, she will cut back on acidic and spicy and high caffeine foods, increase water intake, make more bland food choices for now, work on getting fiber daily, and if no improving in the next few days, consider labs, stool studies.   discussed her medications including Levsin, Imodium, milk of magnesia for constipation.    We will await their call back.   Patient was seen in conjunction with PA student Wallis BambergMario Mani, and I have also evaluated and examined patient, agree with student's notes, student supervised by me.

## 2013-12-13 ENCOUNTER — Other Ambulatory Visit: Payer: Self-pay

## 2013-12-13 ENCOUNTER — Other Ambulatory Visit: Payer: Self-pay | Admitting: Interventional Cardiology

## 2013-12-13 ENCOUNTER — Other Ambulatory Visit: Payer: Self-pay | Admitting: Family Medicine

## 2013-12-13 MED ORDER — LEVOTHYROXINE SODIUM 88 MCG PO TABS: ORAL_TABLET | ORAL | Status: DC

## 2013-12-15 ENCOUNTER — Other Ambulatory Visit: Payer: Self-pay | Admitting: Interventional Cardiology

## 2013-12-18 ENCOUNTER — Other Ambulatory Visit: Payer: Self-pay

## 2013-12-18 MED ORDER — POTASSIUM CHLORIDE CRYS ER 10 MEQ PO TBCR: 10.0000 meq | EXTENDED_RELEASE_TABLET | Freq: Every day | ORAL | Status: DC

## 2013-12-25 ENCOUNTER — Telehealth: Payer: Self-pay | Admitting: Interventional Cardiology

## 2013-12-25 NOTE — Telephone Encounter (Signed)
New message      Clarify medication k dur klor con dosage

## 2013-12-25 NOTE — Telephone Encounter (Signed)
The pts correct Potassium dosage of 10 meq. daily has been called in to [facility].

## 2013-12-26 ENCOUNTER — Emergency Department (HOSPITAL_COMMUNITY)
Admission: EM | Admit: 2013-12-26 | Discharge: 2013-12-26 | Disposition: A | Discharge status: Home / Self Care | Payer: MEDICARE | Attending: Emergency Medicine | Admitting: Emergency Medicine

## 2013-12-26 ENCOUNTER — Emergency Department (HOSPITAL_COMMUNITY): Payer: MEDICARE

## 2013-12-26 ENCOUNTER — Ambulatory Visit (INDEPENDENT_AMBULATORY_CARE_PROVIDER_SITE_OTHER): Payer: MEDICARE | Admitting: Family Medicine

## 2013-12-26 ENCOUNTER — Encounter (HOSPITAL_COMMUNITY): Payer: Self-pay | Admitting: Emergency Medicine

## 2013-12-26 DIAGNOSIS — S99929A Unspecified injury of unspecified foot, initial encounter: Secondary | ICD-10-CM | POA: Diagnosis present

## 2013-12-26 DIAGNOSIS — K219 Gastro-esophageal reflux disease without esophagitis: Secondary | ICD-10-CM | POA: Diagnosis not present

## 2013-12-26 DIAGNOSIS — S81009A Unspecified open wound, unspecified knee, initial encounter: Secondary | ICD-10-CM | POA: Insufficient documentation

## 2013-12-26 DIAGNOSIS — IMO0002 Reserved for concepts with insufficient information to code with codable children: Secondary | ICD-10-CM | POA: Insufficient documentation

## 2013-12-26 DIAGNOSIS — Z79899 Other long term (current) drug therapy: Secondary | ICD-10-CM | POA: Diagnosis not present

## 2013-12-26 DIAGNOSIS — Z880 Allergy status to penicillin: Secondary | ICD-10-CM | POA: Diagnosis not present

## 2013-12-26 DIAGNOSIS — Y9389 Activity, other specified: Secondary | ICD-10-CM | POA: Insufficient documentation

## 2013-12-26 DIAGNOSIS — D649 Anemia, unspecified: Secondary | ICD-10-CM | POA: Diagnosis not present

## 2013-12-26 DIAGNOSIS — I5032 Chronic diastolic (congestive) heart failure: Secondary | ICD-10-CM | POA: Diagnosis not present

## 2013-12-26 DIAGNOSIS — Z950 Presence of cardiac pacemaker: Secondary | ICD-10-CM | POA: Insufficient documentation

## 2013-12-26 DIAGNOSIS — S91009A Unspecified open wound, unspecified ankle, initial encounter: Principal | ICD-10-CM

## 2013-12-26 DIAGNOSIS — Z9981 Dependence on supplemental oxygen: Secondary | ICD-10-CM | POA: Diagnosis not present

## 2013-12-26 DIAGNOSIS — Y92009 Unspecified place in unspecified non-institutional (private) residence as the place of occurrence of the external cause: Secondary | ICD-10-CM | POA: Insufficient documentation

## 2013-12-26 DIAGNOSIS — F329 Major depressive disorder, single episode, unspecified: Secondary | ICD-10-CM | POA: Diagnosis not present

## 2013-12-26 DIAGNOSIS — Z7982 Long term (current) use of aspirin: Secondary | ICD-10-CM | POA: Insufficient documentation

## 2013-12-26 DIAGNOSIS — Z8709 Personal history of other diseases of the respiratory system: Secondary | ICD-10-CM | POA: Insufficient documentation

## 2013-12-26 DIAGNOSIS — W010XXA Fall on same level from slipping, tripping and stumbling without subsequent striking against object, initial encounter: Secondary | ICD-10-CM | POA: Insufficient documentation

## 2013-12-26 DIAGNOSIS — S8990XA Unspecified injury of unspecified lower leg, initial encounter: Secondary | ICD-10-CM | POA: Insufficient documentation

## 2013-12-26 DIAGNOSIS — I6789 Other cerebrovascular disease: Secondary | ICD-10-CM | POA: Insufficient documentation

## 2013-12-26 DIAGNOSIS — S81809A Unspecified open wound, unspecified lower leg, initial encounter: Principal | ICD-10-CM

## 2013-12-26 DIAGNOSIS — I129 Hypertensive chronic kidney disease with stage 1 through stage 4 chronic kidney disease, or unspecified chronic kidney disease: Secondary | ICD-10-CM | POA: Insufficient documentation

## 2013-12-26 DIAGNOSIS — M810 Age-related osteoporosis without current pathological fracture: Secondary | ICD-10-CM | POA: Diagnosis not present

## 2013-12-26 DIAGNOSIS — Z792 Long term (current) use of antibiotics: Secondary | ICD-10-CM | POA: Diagnosis not present

## 2013-12-26 DIAGNOSIS — E039 Hypothyroidism, unspecified: Secondary | ICD-10-CM | POA: Diagnosis not present

## 2013-12-26 DIAGNOSIS — S99919A Unspecified injury of unspecified ankle, initial encounter: Secondary | ICD-10-CM

## 2013-12-26 DIAGNOSIS — I2510 Atherosclerotic heart disease of native coronary artery without angina pectoris: Secondary | ICD-10-CM | POA: Diagnosis not present

## 2013-12-26 DIAGNOSIS — Z23 Encounter for immunization: Secondary | ICD-10-CM | POA: Insufficient documentation

## 2013-12-26 DIAGNOSIS — M129 Arthropathy, unspecified: Secondary | ICD-10-CM | POA: Insufficient documentation

## 2013-12-26 DIAGNOSIS — F3289 Other specified depressive episodes: Secondary | ICD-10-CM | POA: Insufficient documentation

## 2013-12-26 DIAGNOSIS — S81011A Laceration without foreign body, right knee, initial encounter: Secondary | ICD-10-CM

## 2013-12-26 DIAGNOSIS — N189 Chronic kidney disease, unspecified: Secondary | ICD-10-CM | POA: Diagnosis not present

## 2013-12-26 DIAGNOSIS — F411 Generalized anxiety disorder: Secondary | ICD-10-CM | POA: Diagnosis not present

## 2013-12-26 MED ORDER — TETANUS-DIPHTH-ACELL PERTUSSIS 5-2.5-18.5 LF-MCG/0.5 IM SUSP
0.5000 mL | Freq: Once | INTRAMUSCULAR | Status: AC
  Administered 2013-12-26: 0.5 mL via INTRAMUSCULAR
  Filled 2013-12-26: qty 0.5

## 2013-12-26 MED ORDER — CEPHALEXIN 250 MG PO CAPS
500.0000 mg | ORAL_CAPSULE | Freq: Once | ORAL | Status: AC
  Administered 2013-12-26: 500 mg via ORAL
  Filled 2013-12-26: qty 2

## 2013-12-26 MED ORDER — LIDOCAINE-EPINEPHRINE 2 %-1:100000 IJ SOLN
30.0000 mL | Freq: Once | INTRAMUSCULAR | Status: DC
  Filled 2013-12-26: qty 30

## 2013-12-26 MED ORDER — ACETAMINOPHEN 325 MG PO TABS
650.0000 mg | ORAL_TABLET | Freq: Once | ORAL | Status: AC
  Administered 2013-12-26: 650 mg via ORAL
  Filled 2013-12-26: qty 2

## 2013-12-26 MED ORDER — CEPHALEXIN 500 MG PO CAPS: 500.0000 mg | ORAL_CAPSULE | Freq: Four times a day (QID) | ORAL | Status: DC

## 2013-12-26 NOTE — Progress Notes (Signed)
   Subjective:    Patient ID: [name], female    DOB: 1920/04/02, 80 y.o.   MRN: [id number]  HPI She sustained an injury to her right knee in a fall earlier today. She is brought here for further evaluation. The exact mechanism and reason behind the fall was not fully evaluated.    Review of Systems     Objective:   Physical Exam Right knee exam shows a horizontal 3 inch laceration that is still actively bleeding. No knee effusion is noted. The patella seemed intact       Assessment & Plan:   Encounter Diagnosis  Name Primary?  . Laceration of knee, right, initial encounter Yes   I have decided to refer her to the emergency room for further evaluation due to possible patellar, and for laceration repair.

## 2013-12-26 NOTE — ED Notes (Signed)
Was out on patio and got foot hung in door way  Landed on rt knee bleeding controlled at this time  Did n olt hit head or pass out has lac to rt knee cap

## 2013-12-26 NOTE — ED Provider Notes (Signed)
CSN: [id number]     Arrival date & time 12/26/13  1218 History   This chart was scribed for non-physician practitioner Wynetta Emery, PA-C, working with Danielle Jester, DO, by [name], ED Scribe. This patient was seen in room TR10C/TR10C and the patient's care was started at 12:44 PM.  First MD Initiated Contact with Patient 12/26/13 1239     Chief Complaint  Patient presents with  . Knee Pain    The history is provided by the patient and a relative. No language interpreter was used.    HPI Comments: [name] is a 80 y.o. female, with a h/o osteoporosis,  who presents to the Emergency Department complaining of a fall which occurred two hours ago when the pt tripped on the door threshold and landed on the floor upon her right knee . She denies LOC. She is unsure if she had a head impact. [name] reports a laceration to her right knee cap with associated pain. She also complains of pain to her left neck. The pt is unsure of her last tetanus.  Patient denies chest pain, shortness of breath, abdominal pain, neck pain, nausea vomiting, change in vision, dysarthria, ataxia.   Past Medical History  Diagnosis Date  . Essential hypertension     controlled  . Osteoporosis   . Persistent atrial fibrillation     probably permanent  . Vertigo   . Chronic renal insufficiency   . Hypothyroidism   . Arthritis   . Anxiety   . On home oxygen therapy     uses O2 @ 2l/m nasally bedtime  . Tachy-brady syndrome      treated with amiodarone and permanent DDD pacemaker, 1993  . GERD (gastroesophageal reflux disease)   . Atrial flutter   . Encounter for long-term (current) use of other medications     prior bleeding with coumadin, presently on asa  . Chronic diastolic heart failure   . Coronary atherosclerosis of unspecified type of vessel, native or graft     asymptomatic  . Anemia, unspecified   . Depression   . Retroperitoneal bleed 2005    LIFE THREATENING spontaneous  retroperitoneal bleed on coumadin therapy 2005  . Respiratory failure 07/2011   Past Surgical History  Procedure Laterality Date  . Knee arthroscopy Right   . Total hip arthroplasty Right   . Esophagogastroduodenoscopy N/A 11/26/2012    Procedure: ESOPHAGOGASTRODUODENOSCOPY (EGD);  Surgeon: Charna Elizabeth, MD;  Location: [facility];  Service: Endoscopy;  Laterality: N/A;  . Joint replacement      rt hip  . Shoulder surgery    . Esophagogastroduodenoscopy N/A 03/08/2013    Procedure: ESOPHAGOGASTRODUODENOSCOPY (EGD);  Surgeon: Theda Belfast, MD;  Location: [facility];  Service: Endoscopy;  Laterality: N/A;  . Pacemaker insertion    . Cholecystectomy  10/08/1997    PPM implanted by Dr Amil Amen for tachy/brady syndrome  . Pacemaker generator change  6/10/20087    generator change Zephyr XL DR by Dr Amil Amen   Family History  Problem Relation Age of Onset  . Hypertension Mother   . Heart disease Mother     Heart failure (vague history)   History  Substance Use Topics  . Smoking status: Never Smoker   . Smokeless tobacco: Never Used  . Alcohol Use: No   Review of Systems  A complete 10 system review of systems was obtained, and all systems were negative except where indicated in the HPI and PE.    Allergies  Quinidine;  Tiazac; Warfarin sodium; Percocet; Promethazine hcl; Amiodarone; Procainamide; Warfarin and related; Amoxicillin; Ciprofloxacin; Polocaine; and Septra  Home Medications   Prior to Admission medications   Medication Sig Start Date End Date Taking? Authorizing Provider  acetaminophen (TYLENOL ARTHRITIS PAIN) 650 MG CR tablet Take 650 mg by mouth 2 (two) times daily.     Historical Provider, MD  amLODipine (NORVASC) 5 MG tablet Take 5 mg by mouth daily.    Historical Provider, MD  aspirin EC 81 MG tablet Take 81 mg by mouth daily.    Historical Provider, MD  atenolol (TENORMIN) 25 MG tablet Take 25 mg by mouth 2 (two) times daily. 10am and 6pm    Historical Provider,  MD  atenolol (TENORMIN) 25 MG tablet Take 1 tablet by mouth two  times daily 12/13/13   Lyn RecordsHenry W Smith III, MD  atenolol (TENORMIN) 25 MG tablet Take 1 tablet by mouth two  times daily    Lyn RecordsHenry W Smith III, MD  buPROPion (WELLBUTRIN XL) 300 MG 24 hr tablet Take 300 mg by mouth daily.    Historical Provider, MD  cephALEXin (KEFLEX) 500 MG capsule Take 1 capsule (500 mg total) by mouth 4 (four) times daily. 12/26/13   Emilia BeckKaitlyn Szekalski, PA-C  Cholecalciferol (VITAMIN D3) 2000 UNITS TABS Take 2,000 Units by mouth daily.    Historical Provider, MD  estradiol (ESTRACE) 0.1 MG/GM vaginal cream Place 2 g vaginally at bedtime.     Historical Provider, MD  furosemide (LASIX) 80 MG tablet Take 1.5 tablets (120 mg total) by mouth daily.    Lyn RecordsHenry W Smith III, MD  hyoscyamine (LEVSIN SL) 0.125 MG SL tablet PLACE 1 TABLET (0.125 MG TOTAL) UNDER THE TONGUE EVERY 4 (FOUR) HOURS AS NEEDED FOR CRAMPING. 09/26/13   Ronnald NianJohn C Lalonde, MD  lactose free nutrition (BOOST PLUS) LIQD Take 237 mLs by mouth See admin instructions. Take 237 mls every morning, may take an additional 237 mls in the evening depending on meal intake    Historical Provider, MD  levothyroxine (SYNTHROID, LEVOTHROID) 88 MCG tablet Take 1 tablet by mouth   daily before breakfast. 12/13/13   Ronnald NianJohn C Lalonde, MD  loperamide (IMODIUM A-D) 2 MG tablet Take 2-4 mg by mouth as needed for diarrhea or loose stools. Take 2 tablets at first loose stool, then take 1 tablet as needed for more diarrhea    Historical Provider, MD  metolazone (ZAROXOLYN) 2.5 MG tablet Take 2.5 mg by mouth once a week. Take 30 minutes before Lasix on Mondays 05/31/13   Lyn RecordsHenry W Smith III, MD  Multiple Vitamins-Minerals (ICAPS PO) Take 1 capsule by mouth daily.    Historical Provider, MD  NON FORMULARY Take 1 tablet by mouth daily. Ultra Flora IB once a day    Historical Provider, MD  omeprazole (PRILOSEC) 20 MG capsule Take 40 mg by mouth daily.     Historical Provider, MD  ondansetron (ZOFRAN) 4  MG tablet Take 4 mg by mouth every 6 (six) hours as needed for nausea or vomiting.    Historical Provider, MD  potassium chloride (K-DUR) 10 MEQ tablet Take 1 tablet by mouth  daily    Lyn RecordsHenry W Smith III, MD  potassium chloride (K-DUR,KLOR-CON) 10 MEQ tablet Take 1 tablet (10 mEq total) by mouth daily. 12/18/13   Lyn RecordsHenry W Smith III, MD  traMADol (ULTRAM) 50 MG tablet Take 50 mg by mouth daily as needed (pain).    Historical Provider, MD  trimethoprim (TRIMPEX) 100 MG tablet Take 100  mg by mouth daily.    Historical Provider, MD   Triage Vitals: BP 111/89  Pulse 69  Temp(Src) 97.8 F (36.6 C)  Resp 20  SpO2 95%  Physical Exam  Nursing note and vitals reviewed. Constitutional: She is oriented to person, place, and time. She appears well-developed and well-nourished. No distress.  HENT:  Head: Normocephalic and atraumatic.  Mouth/Throat: Oropharynx is clear and moist.  Eyes: Conjunctivae and EOM are normal. Pupils are equal, round, and reactive to light.  Neck: Normal range of motion. Neck supple.  No midline C-spine  tenderness to palpation or step-offs appreciated. Patient has full range of motion without pain.   Cardiovascular: Normal rate, regular rhythm and intact distal pulses.   Pulmonary/Chest: Effort normal and breath sounds normal. No stridor. No respiratory distress. She has no wheezes. She has no rales. She exhibits no tenderness.  Abdominal: Soft. Bowel sounds are normal. There is no tenderness.  Musculoskeletal: Normal range of motion.  Right knee:  7cm full thickness laceration to anterior knee. FROM. No effusion or crepitance. Anterior and posterior drawer show no abnormal laxity. Stable to valgus and varus stress. Joint lines are non-tender. Neurovascularly intact. Pt ambulates with antalgic gait.    Neurological: She is alert and oriented to person, place, and time.  Follows commands, Clear, goal oriented speech, Strength is 5 out of 5x4 extremities, patient ambulates with  a coordinated in nonantalgic gait. Sensation is grossly intact.   Psychiatric: She has a normal mood and affect.    ED Course  Procedures (including critical care time)  2:36 PM- LACERATION REPAIR PROCEDURE NOTE The patient's identification was confirmed and consent was obtained. This procedure was performed by Wynetta Emery, PA-C,  at 2:36 PM. Site: Right knee Sterile procedures observed Anesthetic used (type and amt): 2% lidocaine without epi; 5 mL Suture type/size: 3.0  Length: 7 cm  # of Sutures: 9  Technique: Running, walking  Complexity: simple  Antibx ointment applied Tetanus ordered Site anesthetized, irrigated with NS, explored without evidence of foreign body, wound well approximated, site covered with dry, sterile dressing.  Patient tolerated procedure well without complications. Instructions for care discussed verbally and patient provided with additional written instructions for homecare and f/u.  DIAGNOSTIC STUDIES: Oxygen Saturation is 95% on room air, normal by my interpretation.    COORDINATION OF CARE:  12:47 PM- Discussed treatment plan with patient, and the patient agreed to the plan. The plan includes imaging, sutures, and a tetanus booster.      Labs Review Labs Reviewed - No data to display  Imaging Review Dg Knee Complete 4 Views Right  12/26/2013   CLINICAL DATA:  Status post fall striking the right knee  EXAM: RIGHT KNEE - COMPLETE 4+ VIEW  COMPARISON:  Right knee series of April 24, 2010  FINDINGS: The bones are osteopenic. There is no acute fracture nor dislocation. The joint spaces are reasonably well maintained. There is beaking of the tibial spines. There is a small peripheral osteophyte from the lateral tibial plateau. The patella is absent. There is popliteal artery calcification. There is irregularity of the skin surface anteriorly.  IMPRESSION: No acute knee for fracture or dislocation is demonstrated. There is no definite diffusion.  There are mild degenerative changes. 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   Electronically Signed   By: David  [place]   On: 12/26/2013 14:10     EKG Interpretation None      MDM   Final diagnoses:  Knee laceration,  right, initial encounter    Filed Vitals:   12/26/13 1234 12/26/13 1607  BP: 111/89 136/64  Pulse: 69 77  Temp: 97.8 F (36.6 C) 98 F (36.7 C)  TempSrc:  Oral  Resp: 20   SpO2: 95% 98%    Medications  acetaminophen (TYLENOL) tablet 650 mg (650 mg Oral Given 12/26/13 1309)  Tdap (BOOSTRIX) injection 0.5 mL (0.5 mLs Intramuscular Given 12/26/13 1311)  cephALEXin (KEFLEX) capsule 500 mg (500 mg Oral Given 12/26/13 1640)    Nya B Teaney is a 80 y.o. female presenting with slip and fall at home with large laceration to right knee. Patient is unsure whether there was head trauma. Considering her age CT head and C-spine will be ordered. X-ray of knee with no acute abnormality she has a patella which was removed secondary to nonhealing fracture in the remote past. Wound is aerated profusely and closed with running locking sutures. Patient will be started on Keflex. Advised wound care and follow PCP for suture removal.  Head CT and cervical spine CT without acute abnormalities. Patient  refused pain medications on multiple occasions. Advised Tylenol for pain relief at home. She ambulates with a walker, fall precautions discussed.  Evaluation does not show pathology that would require ongoing emergent intervention or inpatient treatment. Pt is hemodynamically stable and mentating appropriately. Discussed findings and plan with patient/guardian, who agrees with care plan. All questions answered. Return precautions discussed and outpatient follow up given.   I personally performed the services described in this documentation, which was scribed in my presence. The recorded information has been reviewed and is accurate.   Wynetta Emery, PA-C 12/27/13 1537

## 2013-12-26 NOTE — Discharge Instructions (Signed)
Keep wound dry and do not remove dressing for 24 hours if possible. After that, wash gently morning and night (every 12 hours) with soap and water. Use a topical antibiotic ointment and cover with a bandaid or gauze.  °  °Do NOT use rubbing alcohol or hydrogen peroxide, do not soak the area °  °Present to your primary care doctor or the urgent care of your choice, or the ED for suture removal in 7-10 days. °  °Every attempt was made to remove foreign body (contaminants) from the wound.  However, there is always a chance that some may remain in the wound. This can  increase your risk of infection. °  °If you see signs of infection (warmth, redness, tenderness, pus, sharp increase in pain, fever, red streaking in the skin) immediately return to the emergency department. °  °After the wound heals fully, apply sunscreen for 6-12 months to minimize scarring.  ° ° °Laceration Care, Adult °A laceration is a cut or lesion that goes through all layers of the skin and into the tissue just beneath the skin. °TREATMENT  °Some lacerations may not require closure. Some lacerations may not be able to be closed due to an increased risk of infection. It is important to see your caregiver as soon as possible after an injury to minimize the risk of infection and maximize the opportunity for successful closure. °If closure is appropriate, pain medicines may be given, if needed. The wound will be cleaned to help prevent infection. Your caregiver will use stitches (sutures), staples, wound glue (adhesive), or skin adhesive strips to repair the laceration. These tools bring the skin edges together to allow for faster healing and a better cosmetic outcome. However, all wounds will heal with a scar. Once the wound has healed, scarring can be minimized by covering the wound with sunscreen during the day for 1 full year. °HOME CARE INSTRUCTIONS  °For sutures or staples: °· Keep the wound clean and dry. °· If you were given a bandage  (dressing), you should change it at least once a day. Also, change the dressing if it becomes wet or dirty, or as directed by your caregiver. °· Wash the wound with soap and water 2 times a day. Rinse the wound off with water to remove all soap. Pat the wound dry with a clean towel. °· After cleaning, apply a thin layer of the antibiotic ointment as recommended by your caregiver. This will help prevent infection and keep the dressing from sticking. °· You may shower as usual after the first 24 hours. Do not soak the wound in water until the sutures are removed. °· Only take over-the-counter or prescription medicines for pain, discomfort, or fever as directed by your caregiver. °· Get your sutures or staples removed as directed by your caregiver. °For skin adhesive strips: °· Keep the wound clean and dry. °· Do not get the skin adhesive strips wet. You may bathe carefully, using caution to keep the wound dry. °· If the wound gets wet, pat it dry with a clean towel. °· Skin adhesive strips will fall off on their own. You may trim the strips as the wound heals. Do not remove skin adhesive strips that are still stuck to the wound. They will fall off in time. °For wound adhesive: °· You may briefly wet your wound in the shower or bath. Do not soak or scrub the wound. Do not swim. Avoid periods of heavy perspiration until the skin adhesive has fallen off   on its own. After showering or bathing, gently pat the wound dry with a clean towel. °· Do not apply liquid medicine, cream medicine, or ointment medicine to your wound while the skin adhesive is in place. This may loosen the film before your wound is healed. °· If a dressing is placed over the wound, be careful not to apply tape directly over the skin adhesive. This may cause the adhesive to be pulled off before the wound is healed. °· Avoid prolonged exposure to sunlight or tanning lamps while the skin adhesive is in place. Exposure to ultraviolet light in the first  year will darken the scar. °· The skin adhesive will usually remain in place for 5 to 10 days, then naturally fall off the skin. Do not pick at the adhesive film. °You may need a tetanus shot if: °· You cannot remember when you had your last tetanus shot. °· You have never had a tetanus shot. °If you get a tetanus shot, your arm may swell, get red, and feel warm to the touch. This is common and not a problem. If you need a tetanus shot and you choose not to have one, there is a rare chance of getting tetanus. Sickness from tetanus can be serious. °SEEK MEDICAL CARE IF:  °· You have redness, swelling, or increasing pain in the wound. °· You see a red line that goes away from the wound. °· You have yellowish-white fluid (pus) coming from the wound. °· You have a fever. °· You notice a bad smell coming from the wound or dressing. °· Your wound breaks open before or after sutures have been removed. °· You notice something coming out of the wound such as wood or glass. °· Your wound is on your hand or foot and you cannot move a finger or toe. °SEEK IMMEDIATE MEDICAL CARE IF:  °· Your pain is not controlled with prescribed medicine. °· You have severe swelling around the wound causing pain and numbness or a change in color in your arm, hand, leg, or foot. °· Your wound splits open and starts bleeding. °· You have worsening numbness, weakness, or loss of function of any joint around or beyond the wound. °· You develop painful lumps near the wound or on the skin anywhere on your body. °MAKE SURE YOU:  °· Understand these instructions. °· Will watch your condition. °· Will get help right away if you are not doing well or get worse. °Document Released: 04/25/2005 Document Revised: 07/18/2011 Document Reviewed: 10/19/2010 °ExitCare® Patient Information ©2015 [employer]. This information is not intended to replace advice given to you by your health care provider. Make sure you discuss any questions you have with your health  care provider. ° ° °

## 2013-12-28 NOTE — ED Provider Notes (Signed)
Medical screening examination/treatment/procedure(s) were performed by non-physician practitioner and as supervising physician I was immediately available for consultation/collaboration.   EKG Interpretation None        Samuel JesterKathleen Caetano Oberhaus, DO 12/28/13 1553

## 2014-01-02 ENCOUNTER — Other Ambulatory Visit: Payer: Self-pay

## 2014-01-03 ENCOUNTER — Other Ambulatory Visit: Payer: Self-pay | Admitting: Family Medicine

## 2014-01-03 ENCOUNTER — Other Ambulatory Visit: Payer: Self-pay | Admitting: Interventional Cardiology

## 2014-02-12 ENCOUNTER — Other Ambulatory Visit (INDEPENDENT_AMBULATORY_CARE_PROVIDER_SITE_OTHER): Payer: MEDICARE

## 2014-02-12 DIAGNOSIS — Z23 Encounter for immunization: Secondary | ICD-10-CM

## 2014-02-14 ENCOUNTER — Ambulatory Visit (INDEPENDENT_AMBULATORY_CARE_PROVIDER_SITE_OTHER): Payer: MEDICARE | Admitting: Podiatry

## 2014-02-14 ENCOUNTER — Encounter: Payer: Self-pay | Admitting: Podiatry

## 2014-02-14 VITALS — BP 127/68 | HR 79 | Resp 16 | Ht 65.0 in | Wt 130.0 lb

## 2014-02-14 DIAGNOSIS — B351 Tinea unguium: Secondary | ICD-10-CM

## 2014-02-14 DIAGNOSIS — M79676 Pain in unspecified toe(s): Secondary | ICD-10-CM

## 2014-02-14 NOTE — Progress Notes (Signed)
   Subjective:    Patient ID: [name], female    DOB: 22-Nov-1919, 80 y.o.   MRN: [id number]  HPI Comments: Ms. Eddie CandleCummings, 80 year old female, presents the office with her caregiver for complaints of bilateral toe nail pain. She states that she has a lot of pain on the bilateral big toe nail for which she states is ingrowing and she tries to dig it out herself. She denies any redness or drainage from the nail sites. Nails are particularly painful with shoe gear. No other complaints at this time.     Review of Systems  Constitutional: Positive for activity change, appetite change and fatigue.  HENT: Positive for tinnitus.   Gastrointestinal: Positive for constipation.  Endocrine: Positive for polydipsia and polyuria.  Musculoskeletal: Positive for back pain.  Neurological: Positive for weakness.  Hematological: Bruises/bleeds easily.  Psychiatric/Behavioral: The patient is nervous/anxious.   All other systems reviewed and are negative.      Objective:   Physical Exam AAO x3, NAD DP/PT pulses decreased, CRT less than 3 seconds. Protective sensation intact with Dorann OuSimms Weinstein monofilament, slight decrease in vibratory sensation, Achilles tendon reflex intact. Nails hypertrophic, dystrophic, elongated, brittle, discolored x10. Incurvation on the medial and lateral nail border of bilateral hallux. On the right lateral aspect of the hallux there is evidence of subungual hematoma with lifting of the underlying nail from the nailbed distally. No surrounding erythema or drainage. No open lesions or pre-ulcerative lesions. MMT 5/5, ROM WNL No calf pain, swelling, warmth.      Assessment & Plan:  80 year old female with symptomatic onychomycosis and incurvation of bilateral hallux nails and subungual hematoma on the right hallux nail. -Treatment options discussed including alternatives, risks, complications. -Nail sharply debrided x10 without complications. -Right lateral border of  the hallux nail with subungual hematoma in lifting of the nail. Nail was debrided of loose nail. Patient was concerned about the appearance of the nail discussed with her that should grow out as the nail grows. The nail that was removed was loose and had dried blood underneath it. Underlying skin was intact. -Followup in 3 months or sooner if any problems are to arise or any changes symptoms. In the meantime call the office with any questions, concerns, change in symptoms.

## 2014-02-14 NOTE — Patient Instructions (Signed)
Ingrown Toenail An ingrown toenail occurs when the sharp edge of your toenail grows into the skin. Causes of ingrown toenails include toenails clipped too far back or poorly fitting shoes. Activities involving sudden stops (basketball, tennis) causing "toe jamming" may lead to an ingrown nail. HOME CARE INSTRUCTIONS   Soak the whole foot in warm soapy water for 20 minutes, 3 times per day.  You may lift the edge of the nail away from the sore skin by wedging a small piece of cotton under the corner of the nail. Be careful not to dig (traumatize) and cause more injury to the area.  Wear shoes that fit well. While the ingrown nail is causing problems, sandals may be beneficial.  Trim your toenails regularly and carefully. Cut your toenails straight across, not in a curve. This will prevent injury to the skin at the corners of the toenail.  Keep your feet clean and dry.  Crutches may be helpful early in treatment if walking is painful.  Antibiotics, if prescribed, should be taken as directed.  Return for a wound check in 2 days or as directed.  Only take over-the-counter or prescription medicines for pain, discomfort, or fever as directed by your caregiver. SEEK IMMEDIATE MEDICAL CARE IF:   You have a fever.  You have increasing pain, redness, swelling, or heat at the wound site.  Your toe is not better in 7 days. If conservative treatment is not successful, surgical removal of a portion or all of the nail may be necessary. MAKE SURE YOU:   Understand these instructions.  Will watch your condition.  Will get help right away if you are not doing well or get worse. Document Released: 04/22/2000 Document Revised: 07/18/2011 Document Reviewed: 04/16/2008 ExitCare Patient Information 2015 [employer]. This information is not intended to replace advice given to you by your health care provider. Make sure you discuss any questions you have with your health care provider.  

## 2014-02-25 ENCOUNTER — Encounter: Payer: Self-pay | Admitting: Interventional Cardiology

## 2014-02-25 ENCOUNTER — Ambulatory Visit (INDEPENDENT_AMBULATORY_CARE_PROVIDER_SITE_OTHER): Payer: MEDICARE | Admitting: Interventional Cardiology

## 2014-02-25 VITALS — BP 110/70 | HR 80 | Ht 65.0 in | Wt 142.0 lb

## 2014-02-25 DIAGNOSIS — N183 Chronic kidney disease, stage 3 unspecified: Secondary | ICD-10-CM

## 2014-02-25 DIAGNOSIS — I5032 Chronic diastolic (congestive) heart failure: Secondary | ICD-10-CM

## 2014-02-25 DIAGNOSIS — I10 Essential (primary) hypertension: Secondary | ICD-10-CM

## 2014-02-25 LAB — BASIC METABOLIC PANEL
BUN: 40 mg/dL — ABNORMAL HIGH (ref 6–23)
CO2: 22 meq/L (ref 19–32)
Calcium: 9.8 mg/dL (ref 8.4–10.5)
Chloride: 102 mEq/L (ref 96–112)
Creatinine, Ser: 2.3 mg/dL — ABNORMAL HIGH (ref 0.4–1.2)
GFR: 21.3 mL/min — AB (ref >60.00)
GLUCOSE: 103 mg/dL — AB (ref 70–99)
POTASSIUM: 3.4 meq/L — AB (ref 3.5–5.1)
Sodium: 141 mEq/L (ref 135–145)

## 2014-02-25 NOTE — Patient Instructions (Signed)
Your physician recommends that you continue on your current medications as directed. Please refer to the Current Medication list given to you today.  Lab today:[facility]  Your physician recommends that you schedule a follow-up appointment with Dr.Allred for a pacer check asap  Your physician wants you to follow-up in: 6 months with Dr.Smith You will receive a reminder letter in the mail two months in advance. If you don't receive a letter, please call our office to schedule the follow-up appointment.

## 2014-02-25 NOTE — Progress Notes (Signed)
Patient ID: [name], female   DOB: 06-Apr-1920, 80 y.o.   MRN: [id number]    [address] [place], [place]  [address] Phone: [phone] Fax:  [phone]  Date:  02/25/2014   ID:  [name], DOB 06-Apr-1920, MRN [id number]  PCP:  Carollee HerterLALONDE,JOHN CHARLES, MD   ASSESSMENT:  1. Chronic diastolic heart failure, stable with stable weights and sliding scale diuretic regimen based upon weights. Metolazone is still being used perhaps once per month 2. Chronic atrial fibrillation/flutter, with rate control 3. Prior bleeding on anticoagulation, therefore not being used 4. Chronic hypoxemia on continuous oxygen therapy  PLAN:  1. Continue the same treatment regimen including continuous oxygen therapy 2. Basic metabolic panel will be obtained today 3. Clinical followup in 6-8 months.    SUBJECTIVE: [name] is a 80 y.o. female who is doing well. She complains of having to be on oxygen most of the time. She has not had syncope or excessive edema. She denies transient neurological symptoms. Appetite is been poor. She is getting increasingly frail and has difficulty with balance.   Wt Readings from Last 3 Encounters:  02/25/14 142 lb (64.411 kg)  02/14/14 130 lb (58.968 kg)  11/26/13 142 lb (64.411 kg)     Past Medical History  Diagnosis Date  . Essential hypertension     controlled  . Osteoporosis   . Persistent atrial fibrillation     probably permanent  . Vertigo   . Chronic renal insufficiency   . Hypothyroidism   . Arthritis   . Anxiety   . On home oxygen therapy     uses O2 @ 2l/m nasally bedtime  . Tachy-brady syndrome      treated with amiodarone and permanent DDD pacemaker, 1993  . GERD (gastroesophageal reflux disease)   . Atrial flutter   . Encounter for long-term (current) use of other medications     prior bleeding with coumadin, presently on asa  . Chronic diastolic heart failure   . Coronary atherosclerosis of unspecified  type of vessel, native or graft     asymptomatic  . Anemia, unspecified   . Depression   . Retroperitoneal bleed 2005    LIFE THREATENING spontaneous retroperitoneal bleed on coumadin therapy 2005  . Respiratory failure 07/2011    Current Outpatient Prescriptions  Medication Sig Dispense Refill  . acetaminophen (TYLENOL ARTHRITIS PAIN) 650 MG CR tablet Take 650 mg by mouth 2 (two) times daily.       Marland Kitchen. amLODipine (NORVASC) 5 MG tablet Take 1 tablet by mouth once a day  90 tablet  0  . aspirin EC 81 MG tablet Take 81 mg by mouth daily.      Marland Kitchen. atenolol (TENORMIN) 25 MG tablet Take 25 mg by mouth 2 (two) times daily. 10am and 6pm      . atenolol (TENORMIN) 25 MG tablet Take 1 tablet by mouth two  times daily  180 tablet  0  . atenolol (TENORMIN) 25 MG tablet Take 1 tablet by mouth two  times daily  180 tablet  0  . buPROPion (WELLBUTRIN XL) 300 MG 24 hr tablet Take 300 mg by mouth daily.      . Cholecalciferol (VITAMIN D3) 2000 UNITS TABS Take 2,000 Units by mouth daily.      Marland Kitchen. estradiol (ESTRACE) 0.1 MG/GM vaginal cream Place 2 g vaginally at bedtime.       . furosemide (LASIX) 80 MG tablet Take 1.5 tablets (  120 mg total) by mouth daily.  135 tablet  2  . hyoscyamine (LEVSIN SL) 0.125 MG SL tablet PLACE 1 TABLET (0.125 MG TOTAL) UNDER THE TONGUE EVERY 4 (FOUR) HOURS AS NEEDED FOR CRAMPING.  15 tablet  0  . levothyroxine (SYNTHROID, LEVOTHROID) 88 MCG tablet Take 1 tablet by mouth   daily before breakfast.  90 tablet  0  . loperamide (IMODIUM A-D) 2 MG tablet Take 2-4 mg by mouth as needed for diarrhea or loose stools. Take 2 tablets at first loose stool, then take 1 tablet as needed for more diarrhea      . metolazone (ZAROXOLYN) 2.5 MG tablet Take 2.5 mg by mouth once a week. Take 30 minutes before Lasix on Mondays      . Multiple Vitamins-Minerals (ICAPS PO) Take 1 capsule by mouth daily.      . mupirocin ointment (BACTROBAN) 2 %       . NON FORMULARY Take 1 tablet by mouth daily. Ultra  Flora IB once a day      . omeprazole (PRILOSEC) 20 MG capsule Take 40 mg by mouth daily.       . ondansetron (ZOFRAN) 4 MG tablet Take 4 mg by mouth every 6 (six) hours as needed for nausea or vomiting.      . potassium chloride (K-DUR) 10 MEQ tablet       . potassium chloride (K-DUR,KLOR-CON) 10 MEQ tablet Take 1 tablet (10 mEq total) by mouth daily.  90 tablet  1  . traMADol (ULTRAM) 50 MG tablet Take 50 mg by mouth daily as needed (pain).      Marland Kitchen trimethoprim (TRIMPEX) 100 MG tablet Take 100 mg by mouth daily.       No current facility-administered medications for this visit.    Allergies:    Allergies  Allergen Reactions  . Quinidine Other (See Comments)    Skin peels  . Tiazac [Diltiazem Hcl] Hives    "don't remember how bad the reaction was" (04/24/2012)  . Warfarin Sodium Other (See Comments)    Life-threatening retroperitoneal bleed in 2005  . Percocet [Oxycodone-Acetaminophen] Other (See Comments)    confusion  . Promethazine Hcl Other (See Comments)    confusion  . Amiodarone Other (See Comments)    Lung problem per daughter  . Procainamide Other (See Comments)    Lupus reaction  . Warfarin And Related Other (See Comments)    Abdominal bleed  . Amoxicillin Rash  . Ciprofloxacin Rash  . Polocaine [Mepivacaine Hcl] Palpitations and Rash  . Septra [Sulfamethoxazole W/Trimethoprim (Co-Trimoxazole)] Rash and Other (See Comments)    Dizziness and tremor "don't remember how bad the reaction was" (04/24/2012)    Social History:  The patient  reports that she has never smoked. She has never used smokeless tobacco. She reports that she does not drink alcohol or use illicit drugs.   ROS:  Please see the history of present illness.   No blood in the urine or stool. No transient neurological complaints.   All other systems reviewed and negative.   OBJECTIVE: VS:  BP 110/70  Pulse 80  Ht 5\' 5"  (1.651 m)  Wt 142 lb (64.411 kg)  BMI 23.63 kg/m2  SpO2 85% Well nourished,  well developed, in no acute distress, appears somewhat puffy. HEENT: normal Neck: JVD flat. Carotid bruit no bruits  Cardiac:  normal S1, S2; RRR; no murmur Lungs:  clear to auscultation bilaterally, no wheezing, rhonchi or rales Abd: soft, nontender, no hepatomegaly Ext:  Edema none. Pulses 2+ Skin: warm and dry Neuro:  CNs 2-12 intact, no focal abnormalities noted  EKG:  Not performed       Signed, Darci NeedleHenry W. B. Jocelyne Reinertsen III, MD 02/25/2014 2:02 PM

## 2014-02-26 ENCOUNTER — Telehealth: Payer: Self-pay | Admitting: *Deleted

## 2014-02-26 DIAGNOSIS — N289 Disorder of kidney and ureter, unspecified: Secondary | ICD-10-CM

## 2014-02-26 DIAGNOSIS — E876 Hypokalemia: Secondary | ICD-10-CM

## 2014-02-26 NOTE — Telephone Encounter (Signed)
Advised patient, verbalized understanding.   Notes Recorded by Lesleigh NoeHenry W Smith III, MD on 02/26/2014 at 4:45 PM She is volume contracted. Creat is 2.3. Decrease furosemide to 80 mg daily. Potassium is low but will improve with decreased furosemide. Continue to follow weights and inform if they start to climb or metolazone needs to be used more frequently.

## 2014-02-27 ENCOUNTER — Telehealth: Payer: Self-pay

## 2014-02-27 NOTE — Telephone Encounter (Signed)
Called to give lab results. Pt rqst they be given to her caretaker. She is volume contracted. Creat is 2.3. Decrease furosemide to 80 mg daily. Potassium is low but will improve with decreased furosemide. Continue to follow weights and inform if they start to climb or metolazone needs to be used more frequently.pt caretaker verbalized understanding.

## 2014-02-27 NOTE — Telephone Encounter (Signed)
Message copied by Jarvis NewcomerPARRIS-GODLEY, Esiah Bazinet S on Thu Feb 27, 2014 10:11 AM ------      Message from: Verdis PrimeSMITH, HENRY      Created: Wed Feb 26, 2014  4:45 PM       She is volume contracted. Creat is 2.3. Decrease furosemide to 80 mg daily. Potassium is low but will improve with decreased furosemide. Continue to follow weights and inform if they start to climb or metolazone needs to be used more frequently. ------

## 2014-03-06 ENCOUNTER — Other Ambulatory Visit (INDEPENDENT_AMBULATORY_CARE_PROVIDER_SITE_OTHER): Payer: MEDICARE | Admitting: *Deleted

## 2014-03-06 ENCOUNTER — Ambulatory Visit (INDEPENDENT_AMBULATORY_CARE_PROVIDER_SITE_OTHER): Payer: MEDICARE | Admitting: *Deleted

## 2014-03-06 DIAGNOSIS — E876 Hypokalemia: Secondary | ICD-10-CM

## 2014-03-06 DIAGNOSIS — N289 Disorder of kidney and ureter, unspecified: Secondary | ICD-10-CM

## 2014-03-06 DIAGNOSIS — I495 Sick sinus syndrome: Secondary | ICD-10-CM

## 2014-03-06 LAB — MDC_IDC_ENUM_SESS_TYPE_INCLINIC
Date Time Interrogation Session: 20151029110856
Implantable Pulse Generator Model: 5826
Lead Channel Impedance Value: 505 Ohm
Lead Channel Pacing Threshold Amplitude: 0.75 V
Lead Channel Sensing Intrinsic Amplitude: 4.1 mV
Lead Channel Setting Pacing Amplitude: 2.5 V
Lead Channel Setting Pacing Pulse Width: 0.4 ms
MDC IDC MSMT BATTERY IMPEDANCE: 2200 Ohm
MDC IDC MSMT BATTERY VOLTAGE: 2.75 V
MDC IDC MSMT LEADCHNL RV PACING THRESHOLD PULSEWIDTH: 0.4 ms
MDC IDC PG SERIAL: 1904874
MDC IDC SET LEADCHNL RV SENSING SENSITIVITY: 1.5 mV
MDC IDC STAT BRADY RV PERCENT PACED: 22 %

## 2014-03-06 LAB — BASIC METABOLIC PANEL
BUN: 28 mg/dL — ABNORMAL HIGH (ref 6–23)
CHLORIDE: 105 meq/L (ref 96–112)
CO2: 21 meq/L (ref 19–32)
CREATININE: 2 mg/dL — AB (ref 0.4–1.2)
Calcium: 9.4 mg/dL (ref 8.4–10.5)
GFR: 25.06 mL/min — ABNORMAL LOW (ref >60.00)
Glucose, Bld: 105 mg/dL — ABNORMAL HIGH (ref 70–99)
POTASSIUM: 4 meq/L (ref 3.5–5.1)
Sodium: 138 mEq/L (ref 135–145)

## 2014-03-06 NOTE — Progress Notes (Signed)
Pacemaker check in clinic. Normal device function. Thresholds, sensing, impedances consistent with previous measurements.  R-waves unipolar tip 2.475mV.  Device reprogrammed bipolar sensing after measured R-waves 4.1-4.219mV bipolar.   Device programmed to maximize longevity. No high ventricular rates noted. A-fib, - coumadin due to patient intolerance.  Device programmed at appropriate safety margins. Histogram distribution appropriate for patient activity level. Device programmed to optimize intrinsic conduction. Estimated longevity 4.75 years.  Patient education completed.  ROV 6 months with Dr. Johney FrameAllred.

## 2014-03-12 ENCOUNTER — Encounter: Payer: Self-pay | Admitting: Internal Medicine

## 2014-03-13 ENCOUNTER — Telehealth: Payer: Self-pay

## 2014-03-13 NOTE — Telephone Encounter (Signed)
-----   Message from Henry W Smith III, MD sent at 03/10/2014  8:10 AM EST ----- Labs improved. Has she gains weight? That she have dyspnea? 

## 2014-03-14 ENCOUNTER — Other Ambulatory Visit: Payer: Self-pay | Admitting: Family Medicine

## 2014-03-14 ENCOUNTER — Other Ambulatory Visit: Payer: Self-pay | Admitting: Interventional Cardiology

## 2014-03-14 NOTE — Telephone Encounter (Signed)
-----   Message from Lesleigh NoeHenry W Smith III, MD sent at 03/10/2014  8:10 AM EST ----- Labs improved. Has she gains weight? That she have dyspnea?

## 2014-03-14 NOTE — Telephone Encounter (Signed)
Pt daughter aware of lab results with verbal understanding.  Labs improved. Has she gains weight? That she have dyspnea? Pt daughter sts that pt weight have been stable between 138-140lb Pt sob is stable O2 stst 94-96 Update fwd to Dr.Smith

## 2014-03-14 NOTE — Telephone Encounter (Signed)
-----   Message from Henry W Smith III, MD sent at 03/10/2014  8:10 AM EST ----- Labs improved. Has she gains weight? That she have dyspnea? 

## 2014-03-14 NOTE — Telephone Encounter (Signed)
Follow up ° ° ° ° °Want lab results °

## 2014-03-28 ENCOUNTER — Other Ambulatory Visit: Payer: Self-pay | Admitting: Interventional Cardiology

## 2014-05-16 ENCOUNTER — Ambulatory Visit: Payer: MEDICARE | Admitting: Podiatry

## 2014-05-22 ENCOUNTER — Ambulatory Visit (INDEPENDENT_AMBULATORY_CARE_PROVIDER_SITE_OTHER): Payer: MEDICARE | Admitting: Family Medicine

## 2014-05-22 ENCOUNTER — Encounter: Payer: Self-pay | Admitting: Family Medicine

## 2014-05-22 VITALS — BP 110/50 | HR 74 | Temp 97.7°F | Wt 135.0 lb

## 2014-05-22 DIAGNOSIS — J069 Acute upper respiratory infection, unspecified: Secondary | ICD-10-CM

## 2014-05-22 NOTE — Progress Notes (Signed)
   Subjective:    Patient ID: [name], female    DOB: 12-May-1919, 81 y.o.   MRN: [id number]  HPI She complains that last night she started having difficulty with a slight sore throat, fatigue, dry cough. No earache, fever, chills. She does use oxygen regularly and in the last month has been using this more than in the past. She does have an underlying history of CHF.   Review of Systems     Objective:   Physical Exam alert and in no distress. Tympanic membranes and canals are normal. Throat is clear. Tonsils are normal. Neck is supple without adenopathy or thyromegaly. Cardiac exam shows a regular sinus rhythm without murmurs or gallops. Lungs are clear to auscultation.        Assessment & Plan:  Acute URI  recommend supportive care including using Tylenol for aches and pains as well as cough med of choice. Explained that she will probably be coughing up more in the neck several days. They will call if any questions.

## 2014-06-30 ENCOUNTER — Other Ambulatory Visit: Payer: Self-pay | Admitting: Family Medicine

## 2014-06-30 ENCOUNTER — Other Ambulatory Visit: Payer: Self-pay | Admitting: Interventional Cardiology

## 2014-07-03 ENCOUNTER — Other Ambulatory Visit: Payer: Self-pay | Admitting: Interventional Cardiology

## 2014-07-03 MED ORDER — METOLAZONE 2.5 MG PO TABS: 2.5000 mg | ORAL_TABLET | ORAL | Status: DC

## 2014-07-11 ENCOUNTER — Inpatient Hospital Stay (HOSPITAL_COMMUNITY)
Admission: EM | Admit: 2014-07-11 | Discharge: 2014-07-22 | DRG: 480 | Disposition: A | Discharge status: Skilled Nursing Facility | Payer: MEDICARE | Attending: Internal Medicine | Admitting: Internal Medicine

## 2014-07-11 ENCOUNTER — Encounter (HOSPITAL_COMMUNITY): Payer: Self-pay | Admitting: Emergency Medicine

## 2014-07-11 ENCOUNTER — Emergency Department (HOSPITAL_COMMUNITY): Payer: MEDICARE

## 2014-07-11 DIAGNOSIS — T502X5A Adverse effect of carbonic-anhydrase inhibitors, benzothiadiazides and other diuretics, initial encounter: Secondary | ICD-10-CM | POA: Diagnosis present

## 2014-07-11 DIAGNOSIS — J449 Chronic obstructive pulmonary disease, unspecified: Secondary | ICD-10-CM | POA: Diagnosis present

## 2014-07-11 DIAGNOSIS — I272 Other secondary pulmonary hypertension: Secondary | ICD-10-CM | POA: Diagnosis present

## 2014-07-11 DIAGNOSIS — I2510 Atherosclerotic heart disease of native coronary artery without angina pectoris: Secondary | ICD-10-CM | POA: Diagnosis present

## 2014-07-11 DIAGNOSIS — Y92019 Unspecified place in single-family (private) house as the place of occurrence of the external cause: Secondary | ICD-10-CM

## 2014-07-11 DIAGNOSIS — I482 Chronic atrial fibrillation: Secondary | ICD-10-CM | POA: Diagnosis present

## 2014-07-11 DIAGNOSIS — Z7982 Long term (current) use of aspirin: Secondary | ICD-10-CM

## 2014-07-11 DIAGNOSIS — K5900 Constipation, unspecified: Secondary | ICD-10-CM | POA: Diagnosis present

## 2014-07-11 DIAGNOSIS — J9621 Acute and chronic respiratory failure with hypoxia: Secondary | ICD-10-CM | POA: Diagnosis present

## 2014-07-11 DIAGNOSIS — I252 Old myocardial infarction: Secondary | ICD-10-CM

## 2014-07-11 DIAGNOSIS — I480 Paroxysmal atrial fibrillation: Secondary | ICD-10-CM | POA: Diagnosis present

## 2014-07-11 DIAGNOSIS — S7292XK Unspecified fracture of left femur, subsequent encounter for closed fracture with nonunion: Secondary | ICD-10-CM

## 2014-07-11 DIAGNOSIS — Z881 Allergy status to other antibiotic agents status: Secondary | ICD-10-CM

## 2014-07-11 DIAGNOSIS — S72142A Displaced intertrochanteric fracture of left femur, initial encounter for closed fracture: Secondary | ICD-10-CM | POA: Diagnosis not present

## 2014-07-11 DIAGNOSIS — M25552 Pain in left hip: Secondary | ICD-10-CM | POA: Diagnosis not present

## 2014-07-11 DIAGNOSIS — R0602 Shortness of breath: Secondary | ICD-10-CM

## 2014-07-11 DIAGNOSIS — Z79899 Other long term (current) drug therapy: Secondary | ICD-10-CM

## 2014-07-11 DIAGNOSIS — Z79891 Long term (current) use of opiate analgesic: Secondary | ICD-10-CM

## 2014-07-11 DIAGNOSIS — R7989 Other specified abnormal findings of blood chemistry: Secondary | ICD-10-CM | POA: Diagnosis present

## 2014-07-11 DIAGNOSIS — E876 Hypokalemia: Secondary | ICD-10-CM | POA: Diagnosis present

## 2014-07-11 DIAGNOSIS — I5032 Chronic diastolic (congestive) heart failure: Secondary | ICD-10-CM | POA: Diagnosis present

## 2014-07-11 DIAGNOSIS — N183 Chronic kidney disease, stage 3 unspecified: Secondary | ICD-10-CM | POA: Diagnosis present

## 2014-07-11 DIAGNOSIS — E039 Hypothyroidism, unspecified: Secondary | ICD-10-CM | POA: Diagnosis present

## 2014-07-11 DIAGNOSIS — K219 Gastro-esophageal reflux disease without esophagitis: Secondary | ICD-10-CM | POA: Diagnosis present

## 2014-07-11 DIAGNOSIS — Z8249 Family history of ischemic heart disease and other diseases of the circulatory system: Secondary | ICD-10-CM

## 2014-07-11 DIAGNOSIS — Z880 Allergy status to penicillin: Secondary | ICD-10-CM

## 2014-07-11 DIAGNOSIS — I10 Essential (primary) hypertension: Secondary | ICD-10-CM | POA: Diagnosis present

## 2014-07-11 DIAGNOSIS — R778 Other specified abnormalities of plasma proteins: Secondary | ICD-10-CM | POA: Diagnosis present

## 2014-07-11 DIAGNOSIS — I248 Other forms of acute ischemic heart disease: Secondary | ICD-10-CM | POA: Diagnosis present

## 2014-07-11 DIAGNOSIS — F329 Major depressive disorder, single episode, unspecified: Secondary | ICD-10-CM | POA: Diagnosis present

## 2014-07-11 DIAGNOSIS — I481 Persistent atrial fibrillation: Secondary | ICD-10-CM | POA: Diagnosis present

## 2014-07-11 DIAGNOSIS — J9690 Respiratory failure, unspecified, unspecified whether with hypoxia or hypercapnia: Secondary | ICD-10-CM

## 2014-07-11 DIAGNOSIS — W108XXA Fall (on) (from) other stairs and steps, initial encounter: Secondary | ICD-10-CM | POA: Diagnosis present

## 2014-07-11 DIAGNOSIS — Z9981 Dependence on supplemental oxygen: Secondary | ICD-10-CM

## 2014-07-11 DIAGNOSIS — Z96641 Presence of right artificial hip joint: Secondary | ICD-10-CM | POA: Diagnosis present

## 2014-07-11 DIAGNOSIS — R0600 Dyspnea, unspecified: Secondary | ICD-10-CM | POA: Insufficient documentation

## 2014-07-11 DIAGNOSIS — I4891 Unspecified atrial fibrillation: Secondary | ICD-10-CM | POA: Diagnosis present

## 2014-07-11 DIAGNOSIS — I129 Hypertensive chronic kidney disease with stage 1 through stage 4 chronic kidney disease, or unspecified chronic kidney disease: Secondary | ICD-10-CM | POA: Diagnosis present

## 2014-07-11 DIAGNOSIS — Z888 Allergy status to other drugs, medicaments and biological substances status: Secondary | ICD-10-CM

## 2014-07-11 DIAGNOSIS — F419 Anxiety disorder, unspecified: Secondary | ICD-10-CM | POA: Diagnosis present

## 2014-07-11 DIAGNOSIS — Z419 Encounter for procedure for purposes other than remedying health state, unspecified: Secondary | ICD-10-CM

## 2014-07-11 DIAGNOSIS — M1990 Unspecified osteoarthritis, unspecified site: Secondary | ICD-10-CM | POA: Diagnosis present

## 2014-07-11 DIAGNOSIS — R0902 Hypoxemia: Secondary | ICD-10-CM

## 2014-07-11 DIAGNOSIS — Z09 Encounter for follow-up examination after completed treatment for conditions other than malignant neoplasm: Secondary | ICD-10-CM

## 2014-07-11 DIAGNOSIS — S7292XA Unspecified fracture of left femur, initial encounter for closed fracture: Secondary | ICD-10-CM | POA: Diagnosis present

## 2014-07-11 DIAGNOSIS — M810 Age-related osteoporosis without current pathological fracture: Secondary | ICD-10-CM | POA: Diagnosis present

## 2014-07-11 DIAGNOSIS — N184 Chronic kidney disease, stage 4 (severe): Secondary | ICD-10-CM | POA: Diagnosis present

## 2014-07-11 DIAGNOSIS — I5033 Acute on chronic diastolic (congestive) heart failure: Secondary | ICD-10-CM | POA: Diagnosis present

## 2014-07-11 DIAGNOSIS — S72002A Fracture of unspecified part of neck of left femur, initial encounter for closed fracture: Secondary | ICD-10-CM | POA: Diagnosis present

## 2014-07-11 DIAGNOSIS — D649 Anemia, unspecified: Secondary | ICD-10-CM | POA: Diagnosis present

## 2014-07-11 DIAGNOSIS — Y92009 Unspecified place in unspecified non-institutional (private) residence as the place of occurrence of the external cause: Secondary | ICD-10-CM

## 2014-07-11 DIAGNOSIS — E43 Unspecified severe protein-calorie malnutrition: Secondary | ICD-10-CM | POA: Diagnosis present

## 2014-07-11 DIAGNOSIS — W19XXXA Unspecified fall, initial encounter: Secondary | ICD-10-CM

## 2014-07-11 DIAGNOSIS — S7290XA Unspecified fracture of unspecified femur, initial encounter for closed fracture: Secondary | ICD-10-CM | POA: Insufficient documentation

## 2014-07-11 DIAGNOSIS — E785 Hyperlipidemia, unspecified: Secondary | ICD-10-CM | POA: Diagnosis present

## 2014-07-11 DIAGNOSIS — Z6824 Body mass index (BMI) 24.0-24.9, adult: Secondary | ICD-10-CM

## 2014-07-11 LAB — CBC WITH DIFFERENTIAL/PLATELET
Basophils Absolute: 0 10*3/uL (ref 0.0–0.1)
Basophils Relative: 0 % (ref 0–1)
EOS PCT: 0 % (ref 0–5)
Eosinophils Absolute: 0 10*3/uL (ref 0.0–0.7)
HEMATOCRIT: 37.2 % (ref 36.0–46.0)
Hemoglobin: 11.9 g/dL — ABNORMAL LOW (ref 12.0–15.0)
LYMPHS ABS: 1.6 10*3/uL (ref 0.7–4.0)
LYMPHS PCT: 12 % (ref 12–46)
MCH: 29.4 pg (ref 26.0–34.0)
MCHC: 32 g/dL (ref 30.0–36.0)
MCV: 91.9 fL (ref 78.0–100.0)
Monocytes Absolute: 0.8 10*3/uL (ref 0.1–1.0)
Monocytes Relative: 6 % (ref 3–12)
Neutro Abs: 11.1 10*3/uL — ABNORMAL HIGH (ref 1.7–7.7)
Neutrophils Relative %: 82 % — ABNORMAL HIGH (ref 43–77)
Platelets: 241 10*3/uL (ref 150–400)
RBC: 4.05 MIL/uL (ref 3.87–5.11)
RDW: 16.8 % — ABNORMAL HIGH (ref 11.5–15.5)
WBC: 13.6 10*3/uL — ABNORMAL HIGH (ref 4.0–10.5)

## 2014-07-11 LAB — PROTIME-INR
INR: 1.1 (ref 0.00–1.49)
Prothrombin Time: 14.3 seconds (ref 11.6–15.2)

## 2014-07-11 MED ORDER — FENTANYL CITRATE 0.05 MG/ML IJ SOLN
50.0000 ug | INTRAMUSCULAR | Status: AC | PRN
  Administered 2014-07-11 – 2014-07-12 (×2): 50 ug via INTRAVENOUS
  Filled 2014-07-11 (×2): qty 2

## 2014-07-11 MED ORDER — ONDANSETRON HCL 4 MG/2ML IJ SOLN
4.0000 mg | Freq: Once | INTRAMUSCULAR | Status: AC
  Administered 2014-07-11: 4 mg via INTRAVENOUS
  Filled 2014-07-11: qty 2

## 2014-07-11 NOTE — ED Provider Notes (Signed)
CSN: [id number]     Arrival date & time 07/11/14  2037 History   First MD Initiated Contact with Patient 07/11/14 2231     Chief Complaint  Patient presents with  . Fall  . Hip Pain    left     (Consider location/radiation/quality/duration/timing/severity/associated sxs/prior Treatment) HPI [name] is a 81 y.o. female with a history of chronic diastolic heart failure, CAD, pacemaker, A. fib only on aspirin therapy comes in for evaluation of left hip pain. Patient is accompanied by family in the room states that at approximately 7 PM this evening patient was trying to walk up the steps while talking with friends and missed the step and fell forward onto her left side. She did not take any medications or try any interventions prior to EMS arrival. She reports minimal pain at rest, but reports 9/10 discomfort with any manipulation of her left leg at her hip. She denies any numbness, weakness or tingling. Denies any cool extremities. Received 50 mics of fentanyl from EMS which relieved some of her discomfort. Patient is on oxygen at home.  Past Medical History  Diagnosis Date  . Essential hypertension     controlled  . Osteoporosis   . Persistent atrial fibrillation     probably permanent  . Vertigo   . Chronic renal insufficiency   . Hypothyroidism   . Arthritis   . Anxiety   . On home oxygen therapy     uses O2 @ 2l/m nasally bedtime  . Tachy-brady syndrome      treated with amiodarone and permanent DDD pacemaker, 1993  . GERD (gastroesophageal reflux disease)   . Atrial flutter   . Encounter for long-term (current) use of other medications     prior bleeding with coumadin, presently on asa  . Chronic diastolic heart failure   . Coronary atherosclerosis of unspecified type of vessel, native or graft     asymptomatic  . Anemia, unspecified   . Depression   . Retroperitoneal bleed 2005    LIFE THREATENING spontaneous retroperitoneal bleed on coumadin therapy 2005  .  Respiratory failure 07/2011   Past Surgical History  Procedure Laterality Date  . Knee arthroscopy Right   . Total hip arthroplasty Right   . Esophagogastroduodenoscopy N/A 11/26/2012    Procedure: ESOPHAGOGASTRODUODENOSCOPY (EGD);  Surgeon: Charna Elizabeth, MD;  Location: [facility];  Service: Endoscopy;  Laterality: N/A;  . Joint replacement      rt hip  . Shoulder surgery    . Esophagogastroduodenoscopy N/A 03/08/2013    Procedure: ESOPHAGOGASTRODUODENOSCOPY (EGD);  Surgeon: Theda Belfast, MD;  Location: [facility];  Service: Endoscopy;  Laterality: N/A;  . Pacemaker insertion    . Cholecystectomy  10/08/1997    PPM implanted by Dr Amil Amen for tachy/brady syndrome  . Pacemaker generator change  6/10/20087    generator change Zephyr XL DR by Dr Amil Amen   Family History  Problem Relation Age of Onset  . Hypertension Mother   . Heart disease Mother     Heart failure (vague history)   History  Substance Use Topics  . Smoking status: Never Smoker   . Smokeless tobacco: Never Used  . Alcohol Use: No   OB History    No data available     Review of Systems A 10 point review of systems was completed and was negative except for pertinent positives and negatives as mentioned in the history of present illness     Allergies  Quinidine; Tiazac; Warfarin  sodium; Percocet; Promethazine hcl; Amiodarone; Procainamide; Warfarin and related; Amoxicillin; Ciprofloxacin; Polocaine; and Septra  Home Medications   Prior to Admission medications   Medication Sig Start Date End Date Taking? Authorizing Provider  acetaminophen (TYLENOL ARTHRITIS PAIN) 650 MG CR tablet Take 650 mg by mouth 2 (two) times daily.    Yes Historical Provider, MD  amLODipine (NORVASC) 5 MG tablet Take 1 tablet by mouth once a day 07/01/14  Yes Lyn Records III, MD  aspirin EC 81 MG tablet Take 81 mg by mouth daily.   Yes Historical Provider, MD  atenolol (TENORMIN) 25 MG tablet Take 1 tablet by mouth two  times  daily 07/01/14  Yes Lyn Records III, MD  buPROPion (WELLBUTRIN XL) 300 MG 24 hr tablet Take 300 mg by mouth daily.   Yes Historical Provider, MD  Cholecalciferol (VITAMIN D3) 2000 UNITS TABS Take 2,000 Units by mouth daily.   Yes Historical Provider, MD  estradiol (ESTRACE) 0.1 MG/GM vaginal cream Place 2 g vaginally at bedtime.    Yes Historical Provider, MD  furosemide (LASIX) 80 MG tablet Take 1.5 tablets (120 mg  total) by mouth daily. Patient taking differently:  once a day 07/01/14  Yes Lesleigh Noe, MD  levothyroxine (SYNTHROID, LEVOTHROID) 88 MCG tablet Take 1 tablet by mouth  daily before breakfast 06/30/14  Yes Ronnald Nian, MD  metolazone (ZAROXOLYN) 2.5 MG tablet Take 1 tablet (2.5 mg total) by mouth once a week. Take 30 minutes before Lasix on Mondays 07/03/14  Yes Lyn Records III, MD  Multiple Vitamins-Minerals (ICAPS PO) Take 1 capsule by mouth daily.   Yes Historical Provider, MD  NON FORMULARY Take 1 tablet by mouth daily. Ultra Flora IB once a day   Yes Historical Provider, MD  omeprazole (PRILOSEC) 20 MG capsule Take 40 mg by mouth daily.    Yes Historical Provider, MD  potassium chloride (K-DUR) 10 MEQ tablet Take 1 tablet by mouth  daily 07/01/14  Yes Lyn Records III, MD  potassium chloride (K-DUR,KLOR-CON) 10 MEQ tablet Take 1 tablet (10 mEq total) by mouth daily. 12/18/13  Yes Lyn Records III, MD  traMADol (ULTRAM) 50 MG tablet Take 50 mg by mouth daily as needed (pain).   Yes Historical Provider, MD  trimethoprim (TRIMPEX) 100 MG tablet Take 100 mg by mouth daily.   Yes Historical Provider, MD  hyoscyamine (LEVSIN SL) 0.125 MG SL tablet PLACE 1 TABLET (0.125 MG TOTAL) UNDER THE TONGUE EVERY 4 (FOUR) HOURS AS NEEDED FOR CRAMPING. 09/26/13   Ronnald Nian, MD  loperamide (IMODIUM A-D) 2 MG tablet Take 2-4 mg by mouth as needed for diarrhea or loose stools. Take 2 tablets at first loose stool, then take 1 tablet as needed for more diarrhea    Historical Provider, MD   mupirocin ointment (BACTROBAN) 2 %  12/27/13   Historical Provider, MD  ondansetron (ZOFRAN) 4 MG tablet Take 4 mg by mouth every 6 (six) hours as needed for nausea or vomiting.    Historical Provider, MD   BP 129/66 mmHg  Pulse 82  Temp(Src) 97.4 F (36.3 C) (Oral)  Resp 18  Ht  (1.575 m)  Wt 133 lb (60.328 kg)  BMI 24.32 kg/m2  SpO2 95% Physical Exam  Constitutional: She is oriented to person, place, and time. She appears well-developed and well-nourished.  HENT:  Head: Normocephalic and atraumatic.  Mouth/Throat: Oropharynx is clear and moist.  Eyes: Conjunctivae are normal. Pupils are  equal, round, and reactive to light. Right eye exhibits no discharge. Left eye exhibits no discharge. No scleral icterus.  Neck: Neck supple.  Cardiovascular: Normal rate, regular rhythm and normal heart sounds.   Pulmonary/Chest: Effort normal and breath sounds normal. No respiratory distress. She has no wheezes. She has no rales.  Abdominal: Soft. There is no tenderness.  Musculoskeletal: She exhibits no tenderness.  No obvious deformity noted to left hip. No abrasions or other evidence of open fracture. Maintains motor and sensation in bilateral lower extremities. Distal pulses intact  Neurological: She is alert and oriented to person, place, and time.  Cranial Nerves II-XII grossly intact  Skin: Skin is warm and dry. No rash noted.  Psychiatric: She has a normal mood and affect.  Nursing note and vitals reviewed.   ED Course  Procedures (including critical care time) Labs Review Labs Reviewed  BASIC METABOLIC PANEL - Abnormal; Notable for the following:    Potassium 3.3 (*)    Glucose, Bld 130 (*)    BUN 39 (*)    Creatinine, Ser 2.11 (*)    GFR calc non Af Amer 19 (*)    GFR calc Af Amer 22 (*)    All other components within normal limits  CBC WITH DIFFERENTIAL/PLATELET - Abnormal; Notable for the following:    WBC 13.6 (*)    Hemoglobin 11.9 (*)    RDW 16.8 (*)     Neutrophils Relative % 82 (*)    Neutro Abs 11.1 (*)    All other components within normal limits  PROTIME-INR  BRAIN NATRIURETIC PEPTIDE  TROPONIN I  TYPE AND SCREEN    Imaging Review Dg Chest 1 View  07/11/2014   CLINICAL DATA:  Status post fall. Respiratory failure. Initial encounter.  EXAM: CHEST  1 VIEW  COMPARISON:  Chest radiograph performed 07/25/2013  FINDINGS: The lungs are well-aerated. Pulmonary vascularity is at the upper limits of normal. Densities at the lung bases reflect overlying costochondral calcification. There is no evidence of focal opacification, pleural effusion or pneumothorax.  The cardiomediastinal silhouette is borderline enlarged. A pacemaker is noted overlying the left chest wall, with leads ending overlying the right atrium and right ventricle. No acute osseous abnormalities are seen. Left proximal humeral hardware is grossly unremarkable, though incompletely imaged.  IMPRESSION: No acute cardiopulmonary process seen; borderline cardiomegaly. No displaced rib fractures identified.   Electronically Signed   By: Roanna Raider M.D.   On: 07/11/2014 22:55   Dg Hip Unilat With Pelvis 2-3 Views Left  07/11/2014   CLINICAL DATA:  Fall this evening with left hip pain.  EXAM: LEFT HIP (WITH PELVIS) 2-3 VIEWS  COMPARISON:  None.  FINDINGS: Right hip arthroplasty. Lower lumbar spondylosis. Vascular calcifications.  Comminuted intratrochanteric left femur fracture.  No dislocation  IMPRESSION: Comminuted intratrochanteric left femur fracture.   Electronically Signed   By: Jeronimo Greaves M.D.   On: 07/11/2014 22:45     EKG Interpretation   Date/Time:  Friday July 11 2014 23:32:11 EST Ventricular Rate:  84 PR Interval:    QRS Duration: 95 QT Interval:  370 QTC Calculation: 437 R Axis:   78 Text Interpretation:  Atrial fibrillation Repol abnrm suggests ischemia,  anterolateral Baseline wander in lead(s) V1 No significant change since  last tracing Confirmed by DOCHERTY   MD, MEGAN ([id number]) on 07/12/2014 12:15:19  AM     Meds given in ED:  Medications  fentaNYL (SUBLIMAZE) injection 50 mcg (50 mcg Intravenous Given 07/11/14 2342)  ondansetron [facility](ZOFRAN) injection 4 mg (4 mg Intravenous Given 07/11/14 2342)    New Prescriptions   No medications on file   Filed Vitals:   07/11/14 2045 07/11/14 2046 07/11/14 2323 07/12/14 0020  BP:  158/79 150/68 129/66  Pulse:  75 94 82  Temp:  97.4 F (36.3 C) 97.4 F (36.3 C) 97.4 F (36.3 C)  TempSrc:  Oral Oral Oral  Resp:  18 18 18   Height:    5\' 2"  (1.575 m)  Weight:    133 lb (60.328 kg)  SpO2: 98% 90% 94% 95%    MDM  81 year old female found to have comminuted intertrochanteric fracture of left femur. On aspirin therapy for chronic A. fib. Pain managed in ED. Internal medicine, [place] to see in ED and admit. Will consult Ortho. [facility], he anticipates being able to take patient to surgery in the morning. Requests NPO status. Vital signs are stable, patient appears well and is stable for admission at this time. Discussed plan for admission and subsequent surgery with patient and family and they are amenable to this plan. Prior to patient admission, I discussed and reviewed this case with Dr. Micheline Mazeocherty.    Final diagnoses:  Fall at home  Femur fracture, left, closed, initial encounter        Sharlene MottsBenjamin W Jaydah Stahle, PA-C 07/12/14 [phone]  Toy CookeyMegan Docherty, MD 07/12/14 [phone]

## 2014-07-11 NOTE — ED Notes (Addendum)
Per EMS , pt. From home with complaint of fall at 7pm this evening, pt. Reported that she just came back from dinner with friends and missed a step when got home and fell, denies LOC, denies on blood thinner, pain at 10/10 on her left hip . No report of obvious deformity on affected hip. Alert and oriented x2, uses O2 per [place] at home at 3l/min.  Received of IV fentanyl on board EMS, claimed somewhat relief of pain.

## 2014-07-11 NOTE — ED Notes (Signed)
Bed: [facility]25 Expected date:  Expected time:  Means of arrival:  Comments: Pt in room 

## 2014-07-11 NOTE — ED Notes (Signed)
[facility] Expected date:  Expected time:  Means of arrival:  Comments: Ems 81 year old fall hip

## 2014-07-11 NOTE — ED Notes (Signed)
Pts family at bedside

## 2014-07-11 NOTE — Progress Notes (Signed)
CSW met with pt at bedside. Daughter and caretaker were present.  Pt confirms that she presents to [place] due to falling. Pt states that she fell due to missing a step. Pt informed CSW that she lives at home alone in [place], but has a caretaker who is often with her. The caretaker is through a private agency named "[employer]".  Pt states that she is able to complete her ADL's independently. Also, pt informed CSW that she is not interested in a facility, and that she does not feel as though she needs extra help at home.  Daughter/Dawn [place] [phone]  Willette Brace [phone] ED CSW 07/11/2014 10:40 PM

## 2014-07-12 ENCOUNTER — Inpatient Hospital Stay (HOSPITAL_COMMUNITY): Payer: MEDICARE

## 2014-07-12 ENCOUNTER — Other Ambulatory Visit (HOSPITAL_COMMUNITY): Payer: Self-pay

## 2014-07-12 ENCOUNTER — Encounter (HOSPITAL_COMMUNITY): Payer: Self-pay | Admitting: Internal Medicine

## 2014-07-12 DIAGNOSIS — Z880 Allergy status to penicillin: Secondary | ICD-10-CM | POA: Diagnosis not present

## 2014-07-12 DIAGNOSIS — I10 Essential (primary) hypertension: Secondary | ICD-10-CM

## 2014-07-12 DIAGNOSIS — S72002A Fracture of unspecified part of neck of left femur, initial encounter for closed fracture: Secondary | ICD-10-CM | POA: Diagnosis not present

## 2014-07-12 DIAGNOSIS — Z888 Allergy status to other drugs, medicaments and biological substances status: Secondary | ICD-10-CM | POA: Diagnosis not present

## 2014-07-12 DIAGNOSIS — I4891 Unspecified atrial fibrillation: Secondary | ICD-10-CM | POA: Diagnosis not present

## 2014-07-12 DIAGNOSIS — M810 Age-related osteoporosis without current pathological fracture: Secondary | ICD-10-CM | POA: Diagnosis present

## 2014-07-12 DIAGNOSIS — F419 Anxiety disorder, unspecified: Secondary | ICD-10-CM | POA: Diagnosis present

## 2014-07-12 DIAGNOSIS — Z9981 Dependence on supplemental oxygen: Secondary | ICD-10-CM | POA: Diagnosis not present

## 2014-07-12 DIAGNOSIS — E876 Hypokalemia: Secondary | ICD-10-CM | POA: Diagnosis present

## 2014-07-12 DIAGNOSIS — I270 Primary pulmonary hypertension: Secondary | ICD-10-CM

## 2014-07-12 DIAGNOSIS — I481 Persistent atrial fibrillation: Secondary | ICD-10-CM | POA: Diagnosis not present

## 2014-07-12 DIAGNOSIS — I252 Old myocardial infarction: Secondary | ICD-10-CM | POA: Diagnosis not present

## 2014-07-12 DIAGNOSIS — I509 Heart failure, unspecified: Secondary | ICD-10-CM

## 2014-07-12 DIAGNOSIS — R7989 Other specified abnormal findings of blood chemistry: Secondary | ICD-10-CM

## 2014-07-12 DIAGNOSIS — S72142A Displaced intertrochanteric fracture of left femur, initial encounter for closed fracture: Secondary | ICD-10-CM | POA: Diagnosis present

## 2014-07-12 DIAGNOSIS — Z96641 Presence of right artificial hip joint: Secondary | ICD-10-CM | POA: Diagnosis present

## 2014-07-12 DIAGNOSIS — I5032 Chronic diastolic (congestive) heart failure: Secondary | ICD-10-CM

## 2014-07-12 DIAGNOSIS — N184 Chronic kidney disease, stage 4 (severe): Secondary | ICD-10-CM | POA: Diagnosis present

## 2014-07-12 DIAGNOSIS — Z7982 Long term (current) use of aspirin: Secondary | ICD-10-CM | POA: Diagnosis not present

## 2014-07-12 DIAGNOSIS — I5033 Acute on chronic diastolic (congestive) heart failure: Secondary | ICD-10-CM | POA: Diagnosis present

## 2014-07-12 DIAGNOSIS — R0600 Dyspnea, unspecified: Secondary | ICD-10-CM | POA: Diagnosis not present

## 2014-07-12 DIAGNOSIS — M7989 Other specified soft tissue disorders: Secondary | ICD-10-CM | POA: Diagnosis not present

## 2014-07-12 DIAGNOSIS — Z881 Allergy status to other antibiotic agents status: Secondary | ICD-10-CM | POA: Diagnosis not present

## 2014-07-12 DIAGNOSIS — S72002S Fracture of unspecified part of neck of left femur, sequela: Secondary | ICD-10-CM | POA: Diagnosis not present

## 2014-07-12 DIAGNOSIS — M1990 Unspecified osteoarthritis, unspecified site: Secondary | ICD-10-CM | POA: Diagnosis present

## 2014-07-12 DIAGNOSIS — Z01810 Encounter for preprocedural cardiovascular examination: Secondary | ICD-10-CM

## 2014-07-12 DIAGNOSIS — Y92019 Unspecified place in single-family (private) house as the place of occurrence of the external cause: Secondary | ICD-10-CM | POA: Diagnosis not present

## 2014-07-12 DIAGNOSIS — Z6824 Body mass index (BMI) 24.0-24.9, adult: Secondary | ICD-10-CM | POA: Diagnosis not present

## 2014-07-12 DIAGNOSIS — I480 Paroxysmal atrial fibrillation: Secondary | ICD-10-CM | POA: Diagnosis not present

## 2014-07-12 DIAGNOSIS — N183 Chronic kidney disease, stage 3 (moderate): Secondary | ICD-10-CM

## 2014-07-12 DIAGNOSIS — Z79899 Other long term (current) drug therapy: Secondary | ICD-10-CM | POA: Diagnosis not present

## 2014-07-12 DIAGNOSIS — S7292XA Unspecified fracture of left femur, initial encounter for closed fracture: Secondary | ICD-10-CM | POA: Diagnosis not present

## 2014-07-12 DIAGNOSIS — E43 Unspecified severe protein-calorie malnutrition: Secondary | ICD-10-CM | POA: Diagnosis present

## 2014-07-12 DIAGNOSIS — T502X5A Adverse effect of carbonic-anhydrase inhibitors, benzothiadiazides and other diuretics, initial encounter: Secondary | ICD-10-CM | POA: Diagnosis present

## 2014-07-12 DIAGNOSIS — D649 Anemia, unspecified: Secondary | ICD-10-CM | POA: Diagnosis present

## 2014-07-12 DIAGNOSIS — I2510 Atherosclerotic heart disease of native coronary artery without angina pectoris: Secondary | ICD-10-CM | POA: Diagnosis present

## 2014-07-12 DIAGNOSIS — I129 Hypertensive chronic kidney disease with stage 1 through stage 4 chronic kidney disease, or unspecified chronic kidney disease: Secondary | ICD-10-CM | POA: Diagnosis present

## 2014-07-12 DIAGNOSIS — J449 Chronic obstructive pulmonary disease, unspecified: Secondary | ICD-10-CM | POA: Diagnosis present

## 2014-07-12 DIAGNOSIS — I482 Chronic atrial fibrillation: Secondary | ICD-10-CM | POA: Diagnosis not present

## 2014-07-12 DIAGNOSIS — K219 Gastro-esophageal reflux disease without esophagitis: Secondary | ICD-10-CM | POA: Diagnosis present

## 2014-07-12 DIAGNOSIS — I248 Other forms of acute ischemic heart disease: Secondary | ICD-10-CM | POA: Diagnosis present

## 2014-07-12 DIAGNOSIS — E039 Hypothyroidism, unspecified: Secondary | ICD-10-CM | POA: Diagnosis present

## 2014-07-12 DIAGNOSIS — I272 Other secondary pulmonary hypertension: Secondary | ICD-10-CM | POA: Diagnosis present

## 2014-07-12 DIAGNOSIS — K5900 Constipation, unspecified: Secondary | ICD-10-CM | POA: Diagnosis present

## 2014-07-12 DIAGNOSIS — J9621 Acute and chronic respiratory failure with hypoxia: Secondary | ICD-10-CM | POA: Diagnosis present

## 2014-07-12 DIAGNOSIS — Z79891 Long term (current) use of opiate analgesic: Secondary | ICD-10-CM | POA: Diagnosis not present

## 2014-07-12 DIAGNOSIS — F329 Major depressive disorder, single episode, unspecified: Secondary | ICD-10-CM | POA: Diagnosis present

## 2014-07-12 DIAGNOSIS — Z8249 Family history of ischemic heart disease and other diseases of the circulatory system: Secondary | ICD-10-CM | POA: Diagnosis not present

## 2014-07-12 DIAGNOSIS — W108XXA Fall (on) (from) other stairs and steps, initial encounter: Secondary | ICD-10-CM | POA: Diagnosis present

## 2014-07-12 DIAGNOSIS — E785 Hyperlipidemia, unspecified: Secondary | ICD-10-CM | POA: Diagnosis present

## 2014-07-12 DIAGNOSIS — M25552 Pain in left hip: Secondary | ICD-10-CM | POA: Diagnosis present

## 2014-07-12 LAB — URINALYSIS, ROUTINE W REFLEX MICROSCOPIC
BILIRUBIN URINE: NEGATIVE
Glucose, UA: NEGATIVE mg/dL
Hgb urine dipstick: NEGATIVE
KETONES UR: NEGATIVE mg/dL
LEUKOCYTES UA: NEGATIVE
Nitrite: NEGATIVE
PROTEIN: NEGATIVE mg/dL
Specific Gravity, Urine: 1.013 (ref 1.005–1.030)
UROBILINOGEN UA: 0.2 mg/dL (ref 0.0–1.0)
pH: 6.5 (ref 5.0–8.0)

## 2014-07-12 LAB — COMPREHENSIVE METABOLIC PANEL
ALT: 13 U/L (ref 0–35)
AST: 22 U/L (ref 0–37)
Albumin: 3.6 g/dL (ref 3.5–5.2)
Alkaline Phosphatase: 83 U/L (ref 39–117)
Anion gap: 7 (ref 5–15)
BUN: 39 mg/dL — ABNORMAL HIGH (ref 6–23)
CO2: 29 mmol/L (ref 19–32)
Calcium: 8.7 mg/dL (ref 8.4–10.5)
Chloride: 101 mmol/L (ref 96–112)
Creatinine, Ser: 1.94 mg/dL — ABNORMAL HIGH (ref 0.50–1.10)
GFR calc Af Amer: 25 mL/min — ABNORMAL LOW (ref >90)
GFR calc non Af Amer: 21 mL/min — ABNORMAL LOW (ref >90)
Glucose, Bld: 117 mg/dL — ABNORMAL HIGH (ref 70–99)
POTASSIUM: 3.2 mmol/L — AB (ref 3.5–5.1)
SODIUM: 137 mmol/L (ref 135–145)
Total Bilirubin: 0.8 mg/dL (ref 0.3–1.2)
Total Protein: 6.1 g/dL (ref 6.0–8.3)

## 2014-07-12 LAB — CBC WITH DIFFERENTIAL/PLATELET
Basophils Absolute: 0 10*3/uL (ref 0.0–0.1)
Basophils Relative: 0 % (ref 0–1)
Eosinophils Absolute: 0.1 10*3/uL (ref 0.0–0.7)
Eosinophils Relative: 1 % (ref 0–5)
HCT: 36.1 % (ref 36.0–46.0)
Hemoglobin: 11.3 g/dL — ABNORMAL LOW (ref 12.0–15.0)
Lymphocytes Relative: 15 % (ref 12–46)
Lymphs Abs: 1.9 10*3/uL (ref 0.7–4.0)
MCH: 28.7 pg (ref 26.0–34.0)
MCHC: 31.3 g/dL (ref 30.0–36.0)
MCV: 91.6 fL (ref 78.0–100.0)
MONOS PCT: 11 % (ref 3–12)
Monocytes Absolute: 1.4 10*3/uL — ABNORMAL HIGH (ref 0.1–1.0)
NEUTROS PCT: 73 % (ref 43–77)
Neutro Abs: 9.5 10*3/uL — ABNORMAL HIGH (ref 1.7–7.7)
PLATELETS: 237 10*3/uL (ref 150–400)
RBC: 3.94 MIL/uL (ref 3.87–5.11)
RDW: 16.8 % — ABNORMAL HIGH (ref 11.5–15.5)
WBC: 12.9 10*3/uL — ABNORMAL HIGH (ref 4.0–10.5)

## 2014-07-12 LAB — BASIC METABOLIC PANEL
Anion gap: 8 (ref 5–15)
BUN: 39 mg/dL — AB (ref 6–23)
CALCIUM: 9.3 mg/dL (ref 8.4–10.5)
CO2: 26 mmol/L (ref 19–32)
CREATININE: 2.11 mg/dL — AB (ref 0.50–1.10)
Chloride: 102 mmol/L (ref 96–112)
GFR calc Af Amer: 22 mL/min — ABNORMAL LOW (ref >90)
GFR, EST NON AFRICAN AMERICAN: 19 mL/min — AB (ref >90)
Glucose, Bld: 130 mg/dL — ABNORMAL HIGH (ref 70–99)
Potassium: 3.3 mmol/L — ABNORMAL LOW (ref 3.5–5.1)
SODIUM: 136 mmol/L (ref 135–145)

## 2014-07-12 LAB — SURGICAL PCR SCREEN
MRSA, PCR: NEGATIVE
STAPHYLOCOCCUS AUREUS: NEGATIVE

## 2014-07-12 LAB — TYPE AND SCREEN
ABO/RH(D): A POS
Antibody Screen: NEGATIVE

## 2014-07-12 LAB — TROPONIN I
TROPONIN I: 0.11 ng/mL — AB (ref <0.031)
TROPONIN I: 0.12 ng/mL — AB (ref <0.031)
TROPONIN I: 0.11 ng/mL — AB (ref <0.031)
Troponin I: 0.11 ng/mL — ABNORMAL HIGH (ref <0.031)

## 2014-07-12 LAB — BRAIN NATRIURETIC PEPTIDE: B Natriuretic Peptide: 299.6 pg/mL — ABNORMAL HIGH (ref 0.0–100.0)

## 2014-07-12 MED ORDER — DOCUSATE SODIUM 100 MG PO CAPS
100.0000 mg | ORAL_CAPSULE | Freq: Two times a day (BID) | ORAL | Status: DC
  Administered 2014-07-13 – 2014-07-20 (×13): 100 mg via ORAL
  Filled 2014-07-12 (×14): qty 1

## 2014-07-12 MED ORDER — PANTOPRAZOLE SODIUM 40 MG IV SOLR
40.0000 mg | Freq: Every day | INTRAVENOUS | Status: DC
  Administered 2014-07-12: 40 mg via INTRAVENOUS
  Filled 2014-07-12: qty 40

## 2014-07-12 MED ORDER — METHOCARBAMOL 500 MG PO TABS
500.0000 mg | ORAL_TABLET | Freq: Four times a day (QID) | ORAL | Status: DC | PRN
  Administered 2014-07-12 – 2014-07-17 (×8): 500 mg via ORAL
  Filled 2014-07-12 (×8): qty 1

## 2014-07-12 MED ORDER — MORPHINE SULFATE 2 MG/ML IJ SOLN
1.0000 mg | INTRAMUSCULAR | Status: DC | PRN
  Administered 2014-07-12: 2 mg via INTRAVENOUS
  Administered 2014-07-12: 1 mg via INTRAVENOUS
  Administered 2014-07-13 – 2014-07-17 (×28): 2 mg via INTRAVENOUS
  Filled 2014-07-12 (×31): qty 1

## 2014-07-12 MED ORDER — SODIUM CHLORIDE 0.9 % IV SOLN
INTRAVENOUS | Status: DC
  Administered 2014-07-12: 20:00:00 via INTRAVENOUS
  Administered 2014-07-13: 500 mL via INTRAVENOUS
  Administered 2014-07-17: 19:00:00 via INTRAVENOUS

## 2014-07-12 MED ORDER — METOPROLOL TARTRATE 1 MG/ML IV SOLN
2.5000 mg | Freq: Two times a day (BID) | INTRAVENOUS | Status: AC
  Administered 2014-07-12 (×3): 2.5 mg via INTRAVENOUS
  Filled 2014-07-12 (×3): qty 5

## 2014-07-12 MED ORDER — POTASSIUM CHLORIDE CRYS ER 20 MEQ PO TBCR
40.0000 meq | EXTENDED_RELEASE_TABLET | Freq: Once | ORAL | Status: AC
  Administered 2014-07-12: 40 meq via ORAL
  Filled 2014-07-12: qty 2

## 2014-07-12 MED ORDER — MORPHINE SULFATE 2 MG/ML IJ SOLN
0.5000 mg | INTRAMUSCULAR | Status: DC | PRN
  Administered 2014-07-12: 0.5 mg via INTRAVENOUS
  Filled 2014-07-12: qty 1

## 2014-07-12 MED ORDER — HYDROCODONE-ACETAMINOPHEN 5-325 MG PO TABS
1.0000 | ORAL_TABLET | Freq: Four times a day (QID) | ORAL | Status: DC | PRN
  Administered 2014-07-12: 2 via ORAL
  Administered 2014-07-12: 1 via ORAL
  Administered 2014-07-12: 2 via ORAL
  Administered 2014-07-12: 1 via ORAL
  Administered 2014-07-13 – 2014-07-14 (×6): 2 via ORAL
  Administered 2014-07-15: 1 via ORAL
  Administered 2014-07-15: 2 via ORAL
  Administered 2014-07-15: 1 via ORAL
  Administered 2014-07-16 (×3): 2 via ORAL
  Filled 2014-07-12 (×2): qty 1
  Filled 2014-07-12 (×4): qty 2
  Filled 2014-07-12: qty 1
  Filled 2014-07-12 (×6): qty 2
  Filled 2014-07-12: qty 1
  Filled 2014-07-12 (×3): qty 2

## 2014-07-12 MED ORDER — POTASSIUM CHLORIDE 10 MEQ/100ML IV SOLN
10.0000 meq | INTRAVENOUS | Status: DC
  Administered 2014-07-12: 10 meq via INTRAVENOUS
  Filled 2014-07-12: qty 100

## 2014-07-12 MED ORDER — FUROSEMIDE 10 MG/ML IJ SOLN
40.0000 mg | Freq: Every day | INTRAMUSCULAR | Status: DC
  Administered 2014-07-12: 40 mg via INTRAVENOUS
  Filled 2014-07-12: qty 4

## 2014-07-12 MED ORDER — DEXTROSE 5 % IV SOLN
500.0000 mg | Freq: Four times a day (QID) | INTRAVENOUS | Status: DC | PRN
  Filled 2014-07-12 (×2): qty 5

## 2014-07-12 MED ORDER — LEVOTHYROXINE SODIUM 100 MCG IV SOLR
50.0000 ug | Freq: Every day | INTRAVENOUS | Status: DC
  Administered 2014-07-12 – 2014-07-13 (×2): 50 ug via INTRAVENOUS
  Filled 2014-07-12 (×2): qty 5

## 2014-07-12 MED ORDER — ATENOLOL 25 MG PO TABS
25.0000 mg | ORAL_TABLET | Freq: Two times a day (BID) | ORAL | Status: DC
  Administered 2014-07-13 – 2014-07-22 (×13): 25 mg via ORAL
  Filled 2014-07-12 (×16): qty 1

## 2014-07-12 MED ORDER — POTASSIUM CHLORIDE 10 MEQ/100ML IV SOLN
10.0000 meq | INTRAVENOUS | Status: AC
  Administered 2014-07-12 (×2): 10 meq via INTRAVENOUS
  Filled 2014-07-12 (×2): qty 100

## 2014-07-12 MED ORDER — BUPROPION HCL ER (XL) 300 MG PO TB24
300.0000 mg | ORAL_TABLET | Freq: Every day | ORAL | Status: DC
  Administered 2014-07-13 – 2014-07-22 (×10): 300 mg via ORAL
  Filled 2014-07-12 (×10): qty 1

## 2014-07-12 NOTE — Consult Note (Signed)
Reason for Consult:left hip fracture Referring Physician: TRH/EDP  HPI: [name] is an 81 y.o. female with a history of HTN, PAF not anticoagulated 2/2 RP bleed, SSS s/p PPM, diastolic HF who presents with L femur fracture. She states that earlier today she tripped on her steps to the kitchen with subsequent mechanical fall. She reports that she was reasonably active at home but did not leave home on her home. She is able to perform all ADLs without difficulty as well as light housework but does not vacuum, etc. She lives alone w/ family closeby and she has a caretaker who helps her. She is on chronic O2 thought to be 2/2 diastolic HF that she wears all the time. Re: CHF, she takes lasix daily with metolazone 2-3x/wk to augment diuresis.  Past Medical History  Diagnosis Date  . Essential hypertension     controlled  . Osteoporosis   . Persistent atrial fibrillation     probably permanent  . Vertigo   . Chronic renal insufficiency   . Hypothyroidism   . Arthritis   . Anxiety   . On home oxygen therapy     uses O2 @ 2l/m nasally bedtime  . Tachy-brady syndrome      treated with amiodarone and permanent DDD pacemaker, 1993  . GERD (gastroesophageal reflux disease)   . Atrial flutter   . Encounter for long-term (current) use of other medications     prior bleeding with coumadin, presently on asa  . Chronic diastolic heart failure   . Coronary atherosclerosis of unspecified type of vessel, native or graft     asymptomatic  . Anemia, unspecified   . Depression   . Retroperitoneal bleed 2005    LIFE THREATENING spontaneous retroperitoneal bleed on coumadin therapy 2005  . Respiratory failure 07/2011    Past Surgical History  Procedure Laterality Date  . Knee arthroscopy Right   . Total hip arthroplasty Right   . Esophagogastroduodenoscopy N/A 11/26/2012    Procedure: ESOPHAGOGASTRODUODENOSCOPY (EGD);  Surgeon: Juanita Craver, MD;  Location: [facility];  Service: Endoscopy;   Laterality: N/A;  . Joint replacement      rt hip  . Shoulder surgery    . Esophagogastroduodenoscopy N/A 03/08/2013    Procedure: ESOPHAGOGASTRODUODENOSCOPY (EGD);  Surgeon: Beryle Beams, MD;  Location: [facility];  Service: Endoscopy;  Laterality: N/A;  . Pacemaker insertion    . Cholecystectomy  10/08/1997    PPM implanted by Dr Leonia Reeves for tachy/brady syndrome  . Pacemaker generator change  6/10/20087    generator change Zephyr XL DR by Dr Leonia Reeves    Family History  Problem Relation Age of Onset  . Hypertension Mother   . Heart disease Mother     Heart failure (vague history)    Social History:  reports that she has never smoked. She has never used smokeless tobacco. She reports that she does not drink alcohol or use illicit drugs.  Allergies:  Allergies  Allergen Reactions  . Quinidine Other (See Comments)    Skin peels  . Tiazac [Diltiazem Hcl] Hives    "don't remember how bad the reaction was" (04/24/2012)  . Warfarin Sodium Other (See Comments)    Life-threatening retroperitoneal bleed in 2005  . Percocet [Oxycodone-Acetaminophen] Other (See Comments)    confusion  . Promethazine Hcl Other (See Comments)    confusion  . Amiodarone Other (See Comments)    Lung problem per daughter  . Procainamide Other (See Comments)    Lupus reaction  .  Warfarin And Related Other (See Comments)    Abdominal bleed  . Amoxicillin Rash  . Ciprofloxacin Rash  . Polocaine [Mepivacaine Hcl] Palpitations and Rash  . Septra [Sulfamethoxazole W/Trimethoprim (Co-Trimoxazole)] Rash and Other (See Comments)    Dizziness and tremor "don't remember how bad the reaction was" (04/24/2012)    Medications:  Prior to Admission:  Prescriptions prior to admission  Medication Sig Dispense Refill Last Dose  . acetaminophen (TYLENOL ARTHRITIS PAIN) 650 MG CR tablet Take 650 mg by mouth 2 (two) times daily.    Past Week at Unknown time  . amLODipine (NORVASC) 5 MG tablet Take 1 tablet by mouth  once a day 90 tablet 1 07/11/2014 at Unknown time  . aspirin EC 81 MG tablet Take 81 mg by mouth daily.   07/11/2014 at Unknown time  . atenolol (TENORMIN) 25 MG tablet Take 1 tablet by mouth two  times daily 180 tablet 1 07/11/2014 at Unknown time  . buPROPion (WELLBUTRIN XL) 300 MG 24 hr tablet Take 300 mg by mouth daily.   07/11/2014 at Unknown time  . Cholecalciferol (VITAMIN D3) 2000 UNITS TABS Take 2,000 Units by mouth daily.   07/11/2014 at Unknown time  . estradiol (ESTRACE) 0.1 MG/GM vaginal cream Place 2 g vaginally at bedtime.    07/10/2014 at Unknown time  . furosemide (LASIX) 80 MG tablet Take 1.5 tablets (120 mg  total) by mouth daily. (Patient taking differently: 80mg  once a day) 135 tablet 1 07/11/2014 at Unknown time  . levothyroxine (SYNTHROID, LEVOTHROID) 88 MCG tablet Take 1 tablet by mouth  daily before breakfast 90 tablet 1 07/11/2014 at Unknown time  . metolazone (ZAROXOLYN) 2.5 MG tablet Take 1 tablet (2.5 mg total) by mouth once a week. Take 30 minutes before Lasix on Mondays 12 tablet 1 07/11/2014 at Unknown time  . Multiple Vitamins-Minerals (ICAPS PO) Take 1 capsule by mouth daily.   07/11/2014 at Unknown time  . NON FORMULARY Take 1 tablet by mouth daily. Ultra Flora IB once a day   07/11/2014 at Unknown time  . omeprazole (PRILOSEC) 20 MG capsule Take 40 mg by mouth daily.    07/11/2014 at Unknown time  . potassium chloride (K-DUR) 10 MEQ tablet Take 1 tablet by mouth  daily 90 tablet 1 07/11/2014 at Unknown time  . potassium chloride (K-DUR,KLOR-CON) 10 MEQ tablet Take 1 tablet (10 mEq total) by mouth daily. 90 tablet 1 07/11/2014 at Unknown time  . traMADol (ULTRAM) 50 MG tablet Take 50 mg by mouth daily as needed (pain).   07/10/2014 at Unknown time  . trimethoprim (TRIMPEX) 100 MG tablet Take 100 mg by mouth daily.   07/11/2014 at Unknown time  . hyoscyamine (LEVSIN SL) 0.125 MG SL tablet PLACE 1 TABLET (0.125 MG TOTAL) UNDER THE TONGUE EVERY 4 (FOUR) HOURS AS NEEDED FOR CRAMPING. 15 tablet 0  Taking  . loperamide (IMODIUM A-D) 2 MG tablet Take 2-4 mg by mouth as needed for diarrhea or loose stools. Take 2 tablets at first loose stool, then take 1 tablet as needed for more diarrhea   unknown at unknown  . mupirocin ointment (BACTROBAN) 2 %    Taking  . ondansetron (ZOFRAN) 4 MG tablet Take 4 mg by mouth every 6 (six) hours as needed for nausea or vomiting.   unknown at unknown    Results for orders placed or performed during the hospital encounter of 07/11/14 (from the past 48 hour(s))  Brain natriuretic peptide     Status:  Abnormal   Collection Time: 07/11/14 11:28 PM  Result Value Ref Range   B Natriuretic Peptide 299.6 (H) 0.0 - 100.0 pg/mL  Troponin I     Status: Abnormal   Collection Time: 07/11/14 11:28 PM  Result Value Ref Range   Troponin I 0.11 (H) <0.031 ng/mL    Comment:        PERSISTENTLY INCREASED TROPONIN VALUES IN THE RANGE OF 0.04-0.49 ng/mL CAN BE SEEN IN:       -UNSTABLE ANGINA       -CONGESTIVE HEART FAILURE       -MYOCARDITIS       -CHEST TRAUMA       -ARRYHTHMIAS       -LATE PRESENTING MYOCARDIAL INFARCTION       -COPD   CLINICAL FOLLOW-UP RECOMMENDED.   Basic metabolic panel     Status: Abnormal   Collection Time: 07/11/14 11:33 PM  Result Value Ref Range   Sodium 136 135 - 145 mmol/L   Potassium 3.3 (L) 3.5 - 5.1 mmol/L   Chloride 102 96 - 112 mmol/L   CO2 26 19 - 32 mmol/L   Glucose, Bld 130 (H) 70 - 99 mg/dL   BUN 39 (H) 6 - 23 mg/dL   Creatinine, Ser 2.11 (H) 0.50 - 1.10 mg/dL   Calcium 9.3 8.4 - 10.5 mg/dL   GFR calc non Af Amer 19 (L) >90 mL/min   GFR calc Af Amer 22 (L) >90 mL/min    Comment: (NOTE) The eGFR has been calculated using the CKD EPI equation. This calculation has not been validated in all clinical situations. eGFR's persistently <90 mL/min signify possible Chronic Kidney Disease.    Anion gap 8 5 - 15  CBC WITH DIFFERENTIAL     Status: Abnormal   Collection Time: 07/11/14 11:33 PM  Result Value Ref Range   WBC  13.6 (H) 4.0 - 10.5 K/uL   RBC 4.05 3.87 - 5.11 MIL/uL   Hemoglobin 11.9 (L) 12.0 - 15.0 g/dL   HCT 37.2 36.0 - 46.0 %   MCV 91.9 78.0 - 100.0 fL   MCH 29.4 26.0 - 34.0 pg   MCHC 32.0 30.0 - 36.0 g/dL   RDW 16.8 (H) 11.5 - 15.5 %   Platelets 241 150 - 400 K/uL   Neutrophils Relative % 82 (H) 43 - 77 %   Neutro Abs 11.1 (H) 1.7 - 7.7 K/uL   Lymphocytes Relative 12 12 - 46 %   Lymphs Abs 1.6 0.7 - 4.0 K/uL   Monocytes Relative 6 3 - 12 %   Monocytes Absolute 0.8 0.1 - 1.0 K/uL   Eosinophils Relative 0 0 - 5 %   Eosinophils Absolute 0.0 0.0 - 0.7 K/uL   Basophils Relative 0 0 - 1 %   Basophils Absolute 0.0 0.0 - 0.1 K/uL  Protime-INR     Status: None   Collection Time: 07/11/14 11:33 PM  Result Value Ref Range   Prothrombin Time 14.3 11.6 - 15.2 seconds   INR 1.10 0.00 - 1.49  Type and screen     Status: None   Collection Time: 07/11/14 11:33 PM  Result Value Ref Range   ABO/RH(D) A POS    Antibody Screen NEG    Sample Expiration 07/14/2014   Urinalysis, Routine w reflex microscopic     Status: None   Collection Time: 07/12/14  2:21 AM  Result Value Ref Range   Color, Urine YELLOW YELLOW   APPearance CLEAR CLEAR  Specific Gravity, Urine 1.013 1.005 - 1.030   pH 6.5 5.0 - 8.0   Glucose, UA NEGATIVE NEGATIVE mg/dL   Hgb urine dipstick NEGATIVE NEGATIVE   Bilirubin Urine NEGATIVE NEGATIVE   Ketones, ur NEGATIVE NEGATIVE mg/dL   Protein, ur NEGATIVE NEGATIVE mg/dL   Urobilinogen, UA 0.2 0.0 - 1.0 mg/dL   Nitrite NEGATIVE NEGATIVE   Leukocytes, UA NEGATIVE NEGATIVE    Comment: MICROSCOPIC NOT DONE ON URINES WITH NEGATIVE PROTEIN, BLOOD, LEUKOCYTES, NITRITE, OR GLUCOSE <1000 mg/dL.  Troponin I (q 6hr x 3)     Status: Abnormal   Collection Time: 07/12/14  3:20 AM  Result Value Ref Range   Troponin I 0.11 (H) <0.031 ng/mL    Comment:        PERSISTENTLY INCREASED TROPONIN VALUES IN THE RANGE OF 0.04-0.49 ng/mL CAN BE SEEN IN:       -UNSTABLE ANGINA       -CONGESTIVE HEART  FAILURE       -MYOCARDITIS       -CHEST TRAUMA       -ARRYHTHMIAS       -LATE PRESENTING MYOCARDIAL INFARCTION       -COPD   CLINICAL FOLLOW-UP RECOMMENDED.   Comprehensive metabolic panel     Status: Abnormal   Collection Time: 07/12/14  3:20 AM  Result Value Ref Range   Sodium 137 135 - 145 mmol/L   Potassium 3.2 (L) 3.5 - 5.1 mmol/L   Chloride 101 96 - 112 mmol/L   CO2 29 19 - 32 mmol/L   Glucose, Bld 117 (H) 70 - 99 mg/dL   BUN 39 (H) 6 - 23 mg/dL   Creatinine, Ser 1.94 (H) 0.50 - 1.10 mg/dL   Calcium 8.7 8.4 - 10.5 mg/dL   Total Protein 6.1 6.0 - 8.3 g/dL   Albumin 3.6 3.5 - 5.2 g/dL   AST 22 0 - 37 U/L   ALT 13 0 - 35 U/L   Alkaline Phosphatase 83 39 - 117 U/L   Total Bilirubin 0.8 0.3 - 1.2 mg/dL   GFR calc non Af Amer 21 (L) >90 mL/min   GFR calc Af Amer 25 (L) >90 mL/min    Comment: (NOTE) The eGFR has been calculated using the CKD EPI equation. This calculation has not been validated in all clinical situations. eGFR's persistently <90 mL/min signify possible Chronic Kidney Disease.    Anion gap 7 5 - 15  CBC WITH DIFFERENTIAL     Status: Abnormal   Collection Time: 07/12/14  3:20 AM  Result Value Ref Range   WBC 12.9 (H) 4.0 - 10.5 K/uL   RBC 3.94 3.87 - 5.11 MIL/uL   Hemoglobin 11.3 (L) 12.0 - 15.0 g/dL   HCT 36.1 36.0 - 46.0 %   MCV 91.6 78.0 - 100.0 fL   MCH 28.7 26.0 - 34.0 pg   MCHC 31.3 30.0 - 36.0 g/dL   RDW 16.8 (H) 11.5 - 15.5 %   Platelets 237 150 - 400 K/uL   Neutrophils Relative % 73 43 - 77 %   Neutro Abs 9.5 (H) 1.7 - 7.7 K/uL   Lymphocytes Relative 15 12 - 46 %   Lymphs Abs 1.9 0.7 - 4.0 K/uL   Monocytes Relative 11 3 - 12 %   Monocytes Absolute 1.4 (H) 0.1 - 1.0 K/uL   Eosinophils Relative 1 0 - 5 %   Eosinophils Absolute 0.1 0.0 - 0.7 K/uL   Basophils Relative 0 0 - 1 %  Basophils Absolute 0.0 0.0 - 0.1 K/uL    Dg Chest 1 View  07/11/2014   CLINICAL DATA:  Status post fall. Respiratory failure. Initial encounter.  EXAM: CHEST  1  VIEW  COMPARISON:  Chest radiograph performed 07/25/2013  FINDINGS: The lungs are well-aerated. Pulmonary vascularity is at the upper limits of normal. Densities at the lung bases reflect overlying costochondral calcification. There is no evidence of focal opacification, pleural effusion or pneumothorax.  The cardiomediastinal silhouette is borderline enlarged. A pacemaker is noted overlying the left chest wall, with leads ending overlying the right atrium and right ventricle. No acute osseous abnormalities are seen. Left proximal humeral hardware is grossly unremarkable, though incompletely imaged.  IMPRESSION: No acute cardiopulmonary process seen; borderline cardiomegaly. No displaced rib fractures identified.   Electronically Signed   By: Garald Balding M.D.   On: 07/11/2014 22:55   Dg Hip Unilat With Pelvis 2-3 Views Left  07/11/2014   CLINICAL DATA:  Fall this evening with left hip pain.  EXAM: LEFT HIP (WITH PELVIS) 2-3 VIEWS  COMPARISON:  None.  FINDINGS: Right hip arthroplasty. Lower lumbar spondylosis. Vascular calcifications.  Comminuted intratrochanteric left femur fracture.  No dislocation  IMPRESSION: Comminuted intratrochanteric left femur fracture.   Electronically Signed   By: Abigail Miyamoto M.D.   On: 07/11/2014 22:45     Vitals Temp:  [97.4 F (36.3 C)-98.3 F (36.8 C)] 98.3 F (36.8 C) (03/05 0508) Pulse Rate:  [67-94] 67 (03/05 0508) Resp:  [16-20] 20 (03/05 0508) BP: (129-158)/(61-79) 132/73 mmHg (03/05 0508) SpO2:  [90 %-99 %] 99 % (03/05 0508) Weight:  [60.328 kg (133 lb)] 60.328 kg (133 lb) (03/05 0020) Body mass index is 24.32 kg/(m^2).  Physical Exam: Thin WF in extreme discomfort, A and O. No bone or joint tenderness or instability in UE's, n/v intact UE's. Left hip diffusely tender, extreme pain with any attempts at mobilization. N/v intact distally LLE, fair ankle and digital motion, decreased secondary to pain. RLE stable    Assessment/Plan: Impression:  left  intertrochanteric hip fracture Extensive underlying medical comorbidities including severe pulmonary HTN and grade 4 renal insufficiency.  Treatment: I have discussed with patient treatment options and risks vs benefits thereof. I have also spoken with medical team and Dr. Caryl Comes. She is at heightened risk for perioperative morbidity/mortality, and a preop echocardiogram has been recommended.  Given extreme pain and functional limitations imposed by recent hip fracture, surgical stabilization is preferred course of management from orthopedic perspective. Unless compelling issues identified on echo, anticipate surgery Sunday, and I will communicate to my partners on call who will be available to provide definitive orthopedic care.  Renzo Vincelette M 07/12/2014, 9:18 AM

## 2014-07-12 NOTE — Clinical Social Work Psychosocial (Signed)
Clinical Social Work Department BRIEF PSYCHOSOCIAL ASSESSMENT 07/12/2014  Patient:  [name], [name]     Account Number:  [id number]     [place] date:  07/11/2014  Clinical Social Worker:  Dede Query, CLINICAL SOCIAL WORKER  Date/Time:  07/12/2014 11:31 AM  Referred by:  Physician  Date Referred:  07/12/2014 Referred for  SNF Placement   Other Referral:   Interview type:  Family Other interview type:   Dawn and Katharine Look both pt's daughters    PSYCHOSOCIAL DATA Living Status:  ALONE Admitted from facility:   Level of care:   Primary support name:  Dawn and Katharine Look Primary support relationship to patient:  CHILD, ADULT Degree of support available:   High both daughters are supportive and available    CURRENT CONCERNS  Other Concerns:    SOCIAL WORK ASSESSMENT / PLAN CSW met with pt and pt's daughter at bedside.  Pt was in process of getting a procedure so daughters stepped outside pt room to meet with CSW.  CSW introduced herself and explained role of CSW.  CSW prompted pt's daughters to discuss pt history and needs.  CSW explained SNF search process and provided active listening to family.  CSW provided list of SNFs and prompted discussion about choice of facility. CSW will fax pt information out to SNF in [place] area.   Assessment/plan status:  Psychosocial Support/Ongoing Assessment of Needs Other assessment/ plan:   Information/referral to community resources:   Send information to [facility] [employer] in [place] area    PATIENT'S/FAMILY'S RESPONSE TO PLAN OF CARE: Pt was getting a procedure done in room so daughters stepped outside to discuss pt history and needs for placement.  Pt's daughters stated that pt has been living on her own with sitters 7 days a week and 3 overnights a week for about 3 years.  Pt's daughters stated that [facility] to [facility] accident but has not been to any other facilities.  Pt's daughters are grateful for help with locating a SNF  for their mother and are hoping that [facility] in [facility] can accommodate pt.  Pt's daughters stated that pt will not be happy about needing a SNF and they are prepared to work with pt regarding her acceptance of the needed SNF   .Dede Query, LCSW [facility] Clinical Social Worker - Weekend Coverage cell #: [phone]

## 2014-07-12 NOTE — Clinical Social Work Placement (Signed)
Clinical Social Work Department CLINICAL SOCIAL WORK PLACEMENT NOTE 07/12/2014  Patient:  Tora DuckCUMMINGS,Cia B  Account Number:  [id number] Admit date:  07/11/2014  Clinical Social Worker:  Elray Bubaegina Iyanni Hepp, CLINICAL SOCIAL WORKER  Date/time:  07/12/2014 01:47 PM  Clinical Social Work is seeking post-discharge placement for this patient at the following level of care:   SKILLED NURSING   (*CSW will update this form in Epic as items are completed)   07/12/2014  [facility]/family provided with [facility] of Clinical Social Work's list of facilities offering this level of care within the geographic area requested by the patient (or if unable, by the patient's family).  07/12/2014  Patient/family informed of their freedom to choose among providers that offer the needed level of care, that participate in Medicare, Medicaid or managed care program needed by the patient, have an available bed and are willing to accept the patient.  07/12/2014  Patient/family informed of [facility]' ownership interest in [facility], as well as of the fact that they are under no obligation to receive care at this facility.  PASARR submitted to EDS on 07/12/2014 PASARR number received on 07/12/2014  FL2 transmitted to all facilities in geographic area requested by pt/family on  07/12/2014 FL2 transmitted to all facilities within larger geographic area on   Patient informed that his/her managed care company has contracts with or will negotiate with  certain facilities, including the following:     Patient/family informed of bed offers received:   Patient chooses bed at  Physician recommends and patient chooses bed at    Patient to be transferred to  on   Patient to be transferred to facility by  Patient and family notified of transfer on  Name of family member notified:    The following physician request were entered in Epic:   Additional Comments:  .Elray Bubaegina Gerrard Crystal, LCSW [facility] Clinical Social Worker - Weekend Coverage cell #: [phone]

## 2014-07-12 NOTE — Progress Notes (Signed)
Pt oxygen sat continues to range from 82-85% on 6 L [place].  Pt eating dinner, denies any pain unless she moves.  Tele shows a fib with HR 90.  Attempted to apply venti mask at 45%, Oxygen sat increased to 95% briefly, but is still fluctuating between 88-90.  Dr Izola PriceMyers notified, orders received.  Will monitor and transfer pt to step down for observation.

## 2014-07-12 NOTE — Consult Note (Cosign Needed)
CARDIOLOGY CONSULT NOTE   Patient ID: [name] MRN: [id number], DOB/AGE: [birth year]   Admit date: 07/11/2014 Date of Consult: 07/12/2014   Primary Physician: Carollee HerterLALONDE,JOHN CHARLES, MD Primary Cardiologist: Katrinka BlazingSmith  CC: Preoperative cardiac evaluation  Problem List  Past Medical History  Diagnosis Date  . Essential hypertension     controlled  . Osteoporosis   . Persistent atrial fibrillation     probably permanent  . Vertigo   . Chronic renal insufficiency   . Hypothyroidism   . Arthritis   . Anxiety   . On home oxygen therapy     uses O2 @ 2l/m nasally bedtime  . Tachy-brady syndrome      treated with amiodarone and permanent DDD pacemaker, 1993  . GERD (gastroesophageal reflux disease)   . Atrial flutter   . Encounter for long-term (current) use of other medications     prior bleeding with coumadin, presently on asa  . Chronic diastolic heart failure   . Coronary atherosclerosis of unspecified type of vessel, native or graft     asymptomatic  . Anemia, unspecified   . Depression   . Retroperitoneal bleed 2005    LIFE THREATENING spontaneous retroperitoneal bleed on coumadin therapy 2005  . Respiratory failure 07/2011    Past Surgical History  Procedure Laterality Date  . Knee arthroscopy Right   . Total hip arthroplasty Right   . Esophagogastroduodenoscopy N/A 11/26/2012    Procedure: ESOPHAGOGASTRODUODENOSCOPY (EGD);  Surgeon: Charna ElizabethJyothi Mann, MD;  Location: [facility];  Service: Endoscopy;  Laterality: N/A;  . Joint replacement      rt hip  . Shoulder surgery    . Esophagogastroduodenoscopy N/A 03/08/2013    Procedure: ESOPHAGOGASTRODUODENOSCOPY (EGD);  Surgeon: Theda BelfastPatrick D Hung, MD;  [facility];  Service: Endoscopy;  Laterality: N/A;  . Pacemaker insertion    . Cholecystectomy  10/08/1997    PPM implanted by Dr Amil AmenEdmunds for tachy/brady syndrome  . Pacemaker generator change  6/10/20087    generator change Zephyr XL DR by Dr Amil AmenEdmunds      Allergies  Allergies  Allergen Reactions  . Quinidine Other (See Comments)    Skin peels  . Tiazac [Diltiazem Hcl] Hives    "don't remember how bad the reaction was" (04/24/2012)  . Warfarin Sodium Other (See Comments)    Life-threatening retroperitoneal bleed in 2005  . Percocet [Oxycodone-Acetaminophen] Other (See Comments)    confusion  . Promethazine Hcl Other (See Comments)    confusion  . Amiodarone Other (See Comments)    Lung problem per daughter  . Procainamide Other (See Comments)    Lupus reaction  . Warfarin And Related Other (See Comments)    Abdominal bleed  . Amoxicillin Rash  . Ciprofloxacin Rash  . Polocaine [Mepivacaine Hcl] Palpitations and Rash  . Septra [Sulfamethoxazole W/Trimethoprim (Co-Trimoxazole)] Rash and Other (See Comments)    Dizziness and tremor "don't remember how bad the reaction was" (04/24/2012)    HPI  The patient is a 42F with a history of HTN, PAF not anticoagulated 2/2 RP bleed, SSS s/p PPM, diastolic HF who presents with L femur fracture. She states that earlier today she tripped on her steps to the kitchen and fell down. Evaluation in the ED demonstrated a left intertronchanteric hip fracture and she was recommended surgery. We are consulted to advise perioperative risk stratification.  She reports that she was reasonably active at home but did not leave home on her home. She is able to perform all ADLs without  difficulty as well as light housework but does not vacuum, etc. She lives alone w/ family closeby and she has a caretaker who helps her. She is on chronic O2 thought to be 2/2 diastolic HF that she wears all the time. Re: CHF, she takes lasix daily with metolazone 2-3x/wk to augment diuresis.  She does not carry a diagnosis of pulmonary HTN but her last TTE in 2014 demonstrated a peak PASP of .   She also has a history of PAF but is not currently anticoagulated due to a spontaneous RP bleed in 2005 while on warfarin.  In  the ED, a troponin was checked and was 0.11  Risk factors: HTN: Y HLD: Y (prior elevation in LFTs w/ statin) DM: N Smoker: N FHX: N  Inpatient Medications  . [START ON 07/13/2014] atenolol  25 mg Oral BID  . [START ON 07/13/2014] buPROPion  300 mg Oral Daily  . [START ON 07/13/2014] docusate sodium  100 mg Oral BID  . furosemide  40 mg Intravenous Daily  . levothyroxine  50 mcg Intravenous Daily  . metoprolol  2.5 mg Intravenous Q12H  . pantoprazole (PROTONIX) IV  40 mg Intravenous QHS  . potassium chloride  10 mEq Intravenous Q1 Hr x 2    Family History Family History  Problem Relation Age of Onset  . Hypertension Mother   . Heart disease Mother     Heart failure (vague history)     Social History History   Social History  . Marital Status: Married    Spouse Name: N/A  . Number of Children: N/A  . Years of Education: N/A   Occupational History  . Not on file.   Social History Main Topics  . Smoking status: Never Smoker   . Smokeless tobacco: Never Used  . Alcohol Use: No  . Drug Use: No  . Sexual Activity: Not Currently   Other Topics Concern  . Not on file   Social History Narrative   Lives at home near [employer].  Widowed.  Three daughters.       Review of Systems  General:  No chills, fever, night sweats or weight changes.  Cardiovascular:  No chest pain, dyspnea on exertion, edema, orthopnea, palpitations, paroxysmal nocturnal dyspnea. Dermatological: No rash, lesions/masses Respiratory: No cough, dyspnea Urologic: No hematuria, dysuria Abdominal:   No nausea, vomiting, diarrhea, bright red blood per rectum, melena, or hematemesis Neurologic:  No visual changes, wkns, changes in mental status. All other systems reviewed and are otherwise negative except as noted above.  Physical Exam  Blood pressure 152/61, pulse 88, temperature 97.8 F (36.6 C), temperature source Oral, resp. rate 16, height  (1.575 m), weight 133 lb (60.328 kg), SpO2  92 %.  General: Pleasant, NAD, lying flat Psych: Normal affect. Neuro: Alert and oriented X 3. Moves all extremities spontaneously. HEENT: Normal  Neck: Supple without bruits or JVD. Lungs:  Resp regular and unlabored, CTA. Assessed anteriorly only Heart: IRIR no s3, s4, or murmurs. Abdomen: Soft, non-tender, non-distended, BS + x 4.  Extremities: No clubbing, cyanosis or edema. DP/PT/Radials 2+ and equal bilaterally.Unable to move L lef  Labs   Recent Labs  07/11/14 2328  TROPONINI 0.11*   Lab Results  Component Value Date   WBC 13.6* 07/11/2014   HGB 11.9* 07/11/2014   HCT 37.2 07/11/2014   MCV 91.9 07/11/2014   PLT 241 07/11/2014    Recent Labs Lab 07/11/14 2333  NA 136  K 3.3*  CL  102  CO2 26  BUN 39*  CREATININE 2.11*  CALCIUM 9.3  GLUCOSE 130*   No results found for: CHOL, HDL, LDLCALC, TRIG Lab Results  Component Value Date   DDIMER 1.30* 04/01/2013    Radiology/Studies  Dg Chest 1 View  07/11/2014   CLINICAL DATA:  Status post fall. Respiratory failure. Initial encounter.  EXAM: CHEST  1 VIEW  COMPARISON:  Chest radiograph performed 07/25/2013  FINDINGS: The lungs are well-aerated. Pulmonary vascularity is at the upper limits of normal. Densities at the lung bases reflect overlying costochondral calcification. There is no evidence of focal opacification, pleural effusion or pneumothorax.  The cardiomediastinal silhouette is borderline enlarged. A pacemaker is noted overlying the left chest wall, with leads ending overlying the right atrium and right ventricle. No acute osseous abnormalities are seen. Left proximal humeral hardware is grossly unremarkable, though incompletely imaged.  IMPRESSION: No acute cardiopulmonary process seen; borderline cardiomegaly. No displaced rib fractures identified.   Electronically Signed   By: Roanna Raider M.D.   On: 07/11/2014 22:55   Dg Hip Unilat With Pelvis 2-3 Views Left  07/11/2014   CLINICAL DATA:  Fall this evening  with left hip pain.  EXAM: LEFT HIP (WITH PELVIS) 2-3 VIEWS  COMPARISON:  None.  FINDINGS: Right hip arthroplasty. Lower lumbar spondylosis. Vascular calcifications.  Comminuted intratrochanteric left femur fracture.  No dislocation  IMPRESSION: Comminuted intratrochanteric left femur fracture.   Electronically Signed   By: Jeronimo Greaves M.D.   On: 07/11/2014 22:45    ECG AF with inferolateral TWI, unchanged from prior.  TTE 04/01/2013 ------------------------------------------------------------ Study Conclusions  - Left ventricle: The cavity size was normal. There was moderate focal basal hypertrophy. Systolic function was normal. The estimated ejection fraction was in the range of 60% to 65%. Wall motion was normal; there were no regional wall motion abnormalities. Features are consistent with a pseudonormal left ventricular filling pattern, with concomitant abnormal relaxation and increased filling pressure (grade 2 diastolic dysfunction). - Left atrium: The atrium was moderately dilated. - Right ventricle: The cavity size was mildly dilated. Wall thickness was normal. - Right atrium: The atrium was severely dilated. - Tricuspid valve: Moderate regurgitation. - Pulmonary arteries: PA peak pressure: 88mm Hg (S). Impressions:  - The right ventricular systolic pressure was increased consistent with severe pulmonary hypertension.   ASSESSMENT AND PLAN 1. CHF with preserved EF, chronic 2. HTN 3. Likely severe pulmonary HTN 4. Paroxysmal AF 5. SSS with pacemaker  The patient is a 74F with a history of HTN, PAF not anticoagulated 2/2 RP bleed, SSS s/p PPM, diastolic HF who presents with L femur fracture. She is elderly and frail with multiple comorbidities and is on chronic home O2 for CHF and likely severe pulmonary HTN. She has been unable to tolerate anticoagulation due to RP hemorrhage. I doubt that she can perform 4 METs of activity without rest. Her RCRI  index is 2 (CHF, Cr>2) which gives her 6.6% risk of major cardiac event, but this likely underestimates her true risk. She also has a positive troponin that is likely due to supply/demand mismatch in the setting of her fracture. Therefore, she is likely high risk for intermediate risk surgery. We will repeat her TTE in the AM. If she continues to have high pulmonary pressures, she would likely benefit from advanced anesthesia support, particularly during induction of anesthesia because of her likely inability to tolerate extreme changes in BP. Would recommend the following: -Continue ASA -Continue BB but would do metoprolol  25mg  bid to start. Atenolol may be suboptimal choice given her CKD -Reasonable to defer on statin given elevated LFTs in the past -TTE ordered for AM. Her PASP will be particularly important in assessing the need additional anesthesia support. -Continue to trend cardiac biomarkers. Can consider heparin if markedly higher but with caution given her prior history of RP hemorrhage   Signed, Sammuel Hines, MD MPH 07/12/2014, 2:48 AM

## 2014-07-12 NOTE — Progress Notes (Signed)
Nursing staff reported hypoxia at rest, difficulty to maintain oxygen saturations > 88% on 6 L [place] and now transitioned to venti mask, no significant improvement. No respiratory distress at this time. WIll transfer pt to SDU for close monitoring overnight. Obtain one set of troponin now.  Debbora PrestoMAGICK-Amrita Radu, MD  [facility] Pager [phone] Cell [phone]  If 7PM-7AM, please contact night-coverage [email or web address] Password [email or web address]

## 2014-07-12 NOTE — Progress Notes (Addendum)
Patient Name: [name]      SUBJECTIVE:  Admitted with hip fracture  comorbities incl AF perm with PM [employer] (DDD with atrial undersensing) COPD, HFpEF requiring O2 chronic diuretics w prn Metalozone, severe Pulm HTN (88mm -2014) Renal insufficiency grade 4   She has been appropriately at being at high risk for surgery,  betablockers will be useful and attention to hypotension will be important Past Medical History  Diagnosis Date  . Essential hypertension     controlled  . Osteoporosis   . Persistent atrial fibrillation     probably permanent  . Vertigo   . Chronic renal insufficiency   . Hypothyroidism   . Arthritis   . Anxiety   . On home oxygen therapy     uses O2 @ 2l/m nasally bedtime  . Tachy-brady syndrome      treated with amiodarone and permanent DDD pacemaker, 1993  . GERD (gastroesophageal reflux disease)   . Atrial flutter   . Encounter for long-term (current) use of other medications     prior bleeding with coumadin, presently on asa  . Chronic diastolic heart failure   . Coronary atherosclerosis of unspecified type of vessel, native or graft     asymptomatic  . Anemia, unspecified   . Depression   . Retroperitoneal bleed 2005    LIFE THREATENING spontaneous retroperitoneal bleed on coumadin therapy 2005  . Respiratory failure 07/2011    Scheduled Meds:  Scheduled Meds: . [START ON 07/13/2014] atenolol  25 mg Oral BID  . [START ON 07/13/2014] buPROPion  300 mg Oral Daily  . [START ON 07/13/2014] docusate sodium  100 mg Oral BID  . furosemide  40 mg Intravenous Daily  . levothyroxine  50 mcg Intravenous Daily  . metoprolol  2.5 mg Intravenous Q12H  . pantoprazole (PROTONIX) IV  40 mg Intravenous QHS   Continuous Infusions:  HYDROcodone-acetaminophen, methocarbamol **OR** methocarbamol (ROBAXIN)  IV, morphine injection    PHYSICAL EXAM Filed Vitals:   07/12/14 0058 07/12/14 0201 07/12/14 0400 07/12/14 0508  BP: 142/61 152/61  132/73   Pulse: 73 88  67  Temp:  97.8 F (36.6 C)  98.3 F (36.8 C)  TempSrc:  Oral  Oral  Resp: 17 16 16 20   Height:      Weight:      SpO2: 96% 92% 94% 99%    Well developed and nourished in no acute distress HENT normal Neck supple with JVP->10 Clear laterally Irregular rate and rhythm, RV lift Abd-soft with active BS No Clubbing cyanosis edema Skin-warm and dry A & Oriented  Grossly normal sensory and motor function   TELEMETRY: Reviewed telemetry pt in *afib with competitive v pacing    Intake/Output Summary (Last 24 hours) at 07/12/14 0757 Last data filed at 07/12/14 0557  Gross per 24 hour  Intake    230 ml  Output      0 ml  Net    230 ml    LABS: Basic Metabolic Panel:  Recent Labs Lab 07/11/14 2333 07/12/14 0320  NA 136 137  K 3.3* 3.2*  CL 102 101  CO2 26 29  GLUCOSE 130* 117*  BUN 39* 39*  CREATININE 2.11* 1.94*  CALCIUM 9.3 8.7   Cardiac Enzymes:  Recent Labs  07/11/14 2328 07/12/14 0320  TROPONINI 0.11* 0.11*   CBC:  Recent Labs Lab 07/11/14 2333 07/12/14 0320  WBC 13.6* 12.9*  NEUTROABS 11.1* 9.5*  HGB 11.9* 11.3*  HCT 37.2 36.1  MCV 91.9 91.6  PLT 241 237   PROTIME:  Recent Labs  07/11/14 2333  LABPROT 14.3  INR 1.10   Liver Function Tests:  Recent Labs  07/12/14 0320  AST 22  ALT 13  ALKPHOS 83  BILITOT 0.8  PROT 6.1  ALBUMIN 3.6   No results for input(s): LIPASE, AMYLASE in the last 72 hours. BNP: BNP (last 3 results)  Recent Labs  07/11/14 2328  BNP 299.6*    ProBNP (last 3 results)  Recent Labs  07/25/13 1215  PROBNP 4206.0*    D-Dimer: No results for input(s): DDIMER in the last 72 hours. Hemoglobin A1C: No results for input(s): HGBA1C in the last 72 hours. Fasting Lipid Panel: No results for input(s): CHOL, HDL, LDLCALC, TRIG, CHOLHDL, LDLDIRECT in the last 72 hours. Thyroid Function Tests: No results for input(s): TSH, T4TOTAL, T3FREE, THYROIDAB in the last 72 hours.  Invalid  input(s): FREET3 Anemia Panel: No results for input(s): VITAMINB12, FOLATE, FERRITIN, TIBC, IRON, RETICCTPCT in the last 72 hours.   Device Interrogation:*    ASSESSMENT AND PLAN:  Active Problems:   Atrial fibrillation   Elevated troponin   Essential hypertension   Chronic diastolic heart failure   Chronic kidney disease, stage III (moderate)   Femur fracture, left   Closed left hip fracture   Hypokalemia   Pulmonary hypertension  Continue K repletion Await echo Spoke with soninlaw- dermatologist, daughter is ex ICU RN While risks are increased, at this point do not think are prohibiitve as the alternative may be bed boundedness Await ortho input   Signed, Sherryl Manges MD  07/12/2014  [phone]

## 2014-07-12 NOTE — Progress Notes (Signed)
Pt seen and examined at bedside. Admitted after midnight for further management of left intertrochanteric hip fracture. Still in discomfort this AM. Will increase the dose of Morphine to 1-2 mg for better pain control. Cardiology consulted for cardiac clearance, 2 D ECHO pending. Plan for surgery in AM. Appreciate ortho team and cardiology team assistance. Keep NPO after midnight. Please see full admission note by Dr. Adela Glimpseoutova.  Debbora PrestoMAGICK-Lina Hitch, MD  [facility] Pager [phone] Cell [phone]  If 7PM-7AM, please contact night-coverage [email or web address] Password [email or web address]

## 2014-07-12 NOTE — H&P (Signed)
PCP: Carollee Herter, MD  Cardiology [facility] Dr. Charlann Boxer  Chief Complaint:  Fall and left hip   HPI: [name] is a 81 y.o. female   has a past medical history of Essential hypertension; Osteoporosis; Persistent atrial fibrillation; Vertigo; Chronic renal insufficiency; Hypothyroidism; Arthritis; Anxiety; On home oxygen therapy; Tachy-brady syndrome; GERD (gastroesophageal reflux disease); Atrial flutter; Encounter for long-term (current) use of other medications; Chronic diastolic heart failure; Coronary atherosclerosis of unspecified type of vessel, native or graft; Anemia, unspecified; Depression; Retroperitoneal bleed (2005); and Respiratory failure (07/2011).   Presented with  Patient reports tripping on the steps to her kitchen and falling. Patient was in a lot of pain on the left. She denies hitting her head. Ems was called patient presented to [facility] and was found to have Left intertochanteric hip fracture. Patient denies any chest pain. She is able to walk well with a walker. She is fairly active. She does have hx of diastolic CHF she on 2L of oxygen at baseline. Recently her lasix have been decreased to 80 mg a day from 120 due to worsening renal function. Patient has some chronic lower extremity swelling.  Patient has hx of tachy-brady syndrome sp pacemaker. She is chronic A.fib but not a candidate for anticoagulation due to hx of massive retroperitoneal bleed.  Urgency department troponin was noted to be elevated at 0.11 patient denies any chest pain. Discussed with cardiology who will see her for preoperative clearance.   Hospitalist was called for admission for Left hip fracture  Review of Systems:    Pertinent positives include:  Fall,  dyspnea on exertion, shortness of breath at rest. Bilateral lower extremity swelling   Constitutional:  No weight loss, night sweats, Fevers, chills, fatigue, weight loss  HEENT:  No headaches,  Difficulty swallowing,Tooth/dental problems,Sore throat,  No sneezing, itching, ear ache, nasal congestion, post nasal drip,  Cardio-vascular:  No chest pain, Orthopnea, PND, anasarca, dizziness, palpitations.no  GI:  No heartburn, indigestion, abdominal pain, nausea, vomiting, diarrhea, change in bowel habits, loss of appetite, melena, blood in stool, hematemesis Resp:  noNo No excess mucus, no productive cough, No non-productive cough, No coughing up of blood.No change in color of mucus.No wheezing. Skin:  no rash or lesions. No jaundice GU:  no dysuria, change in color of urine, no urgency or frequency. No straining to urinate.  No flank pain.  Musculoskeletal:  No joint pain or no joint swelling. No decreased range of motion. No back pain.  Psych:  No change in mood or affect. No depression or anxiety. No memory loss.  Neuro: no localizing neurological complaints, no tingling, no weakness, no double vision, no gait abnormality, no slurred speech, no confusion  Otherwise ROS are negative except for above, 10 systems were reviewed  Past Medical History: Past Medical History  Diagnosis Date  . Essential hypertension     controlled  . Osteoporosis   . Persistent atrial fibrillation     probably permanent  . Vertigo   . Chronic renal insufficiency   . Hypothyroidism   . Arthritis   . Anxiety   . On home oxygen therapy     uses O2 @ 2l/m nasally bedtime  . Tachy-brady syndrome      treated with amiodarone and permanent DDD pacemaker, 1993  . GERD (gastroesophageal reflux disease)   . Atrial flutter   . Encounter for long-term (current) use of other medications     prior bleeding with coumadin, presently on  asa  . Chronic diastolic heart failure   . Coronary atherosclerosis of unspecified type of vessel, native or graft     asymptomatic  . Anemia, unspecified   . Depression   . Retroperitoneal bleed 2005    LIFE THREATENING spontaneous retroperitoneal bleed on coumadin  therapy 2005  . Respiratory failure 07/2011   Past Surgical History  Procedure Laterality Date  . Knee arthroscopy Right   . Total hip arthroplasty Right   . Esophagogastroduodenoscopy N/A 11/26/2012    Procedure: ESOPHAGOGASTRODUODENOSCOPY (EGD);  Surgeon: Charna Elizabeth, MD;  Location: [facility]OSCOPY;  Service: Endoscopy;  Laterality: N/A;  . Joint replacement      rt hip  . Shoulder surgery    . Esophagogastroduodenoscopy N/A 03/08/2013    Procedure: ESOPHAGOGASTRODUODENOSCOPY (EGD);  Surgeon: Theda Belfast, MD;  Location: [facility];  Service: Endoscopy;  Laterality: N/A;  . Pacemaker insertion    . Cholecystectomy  10/08/1997    PPM implanted by Dr Amil Amen for tachy/brady syndrome  . Pacemaker generator change  6/10/20087    generator change Zephyr XL DR by Dr Amil Amen     Medications: Prior to Admission medications   Medication Sig Start Date End Date Taking? Authorizing Provider  acetaminophen (TYLENOL ARTHRITIS PAIN) 650 MG CR tablet Take 650 mg by mouth 2 (two) times daily.    Yes Historical Provider, MD  amLODipine (NORVASC) 5 MG tablet Take 1 tablet by mouth once a day 07/01/14  Yes Lyn Records III, MD  aspirin EC 81 MG tablet Take 81 mg by mouth daily.   Yes Historical Provider, MD  atenolol (TENORMIN) 25 MG tablet Take 1 tablet by mouth two  times daily 07/01/14  Yes Lyn Records III, MD  buPROPion (WELLBUTRIN XL) 300 MG 24 hr tablet Take 300 mg by mouth daily.   Yes Historical Provider, MD  Cholecalciferol (VITAMIN D3) 2000 UNITS TABS Take 2,000 Units by mouth daily.   Yes Historical Provider, MD  estradiol (ESTRACE) 0.1 MG/GM vaginal cream Place 2 g vaginally at bedtime.    Yes Historical Provider, MD  furosemide (LASIX) 80 MG tablet Take 1.5 tablets (120 mg  total) by mouth daily. Patient taking differently:  once a day 07/01/14  Yes Lesleigh Noe, MD  levothyroxine (SYNTHROID, LEVOTHROID) 88 MCG tablet Take 1 tablet by mouth  daily before breakfast 06/30/14  Yes  Ronnald Nian, MD  metolazone (ZAROXOLYN) 2.5 MG tablet Take 1 tablet (2.5 mg total) by mouth once a week. Take 30 minutes before Lasix on Mondays 07/03/14  Yes Lyn Records III, MD  Multiple Vitamins-Minerals (ICAPS PO) Take 1 capsule by mouth daily.   Yes Historical Provider, MD  NON FORMULARY Take 1 tablet by mouth daily. Ultra Flora IB once a day   Yes Historical Provider, MD  omeprazole (PRILOSEC) 20 MG capsule Take 40 mg by mouth daily.    Yes Historical Provider, MD  potassium chloride (K-DUR) 10 MEQ tablet Take 1 tablet by mouth  daily 07/01/14  Yes Lyn Records III, MD  potassium chloride (K-DUR,KLOR-CON) 10 MEQ tablet Take 1 tablet (10 mEq total) by mouth daily. 12/18/13  Yes Lyn Records III, MD  traMADol (ULTRAM) 50 MG tablet Take 50 mg by mouth daily as needed (pain).   Yes Historical Provider, MD  trimethoprim (TRIMPEX) 100 MG tablet Take 100 mg by mouth daily.   Yes Historical Provider, MD  hyoscyamine (LEVSIN SL) 0.125 MG SL tablet PLACE 1 TABLET (0.125 MG TOTAL)  UNDER THE TONGUE EVERY 4 (FOUR) HOURS AS NEEDED FOR CRAMPING. 09/26/13   Ronnald Nian, MD  loperamide (IMODIUM A-D) 2 MG tablet Take 2-4 mg by mouth as needed for diarrhea or loose stools. Take 2 tablets at first loose stool, then take 1 tablet as needed for more diarrhea    Historical Provider, MD  mupirocin ointment (BACTROBAN) 2 %  12/27/13   Historical Provider, MD  ondansetron (ZOFRAN) 4 MG tablet Take 4 mg by mouth every 6 (six) hours as needed for nausea or vomiting.    Historical Provider, MD    Allergies:   Allergies  Allergen Reactions  . Quinidine Other (See Comments)    Skin peels  . Tiazac [Diltiazem Hcl] Hives    "don't remember how bad the reaction was" (04/24/2012)  . Warfarin Sodium Other (See Comments)    Life-threatening retroperitoneal bleed in 2005  . Percocet [Oxycodone-Acetaminophen] Other (See Comments)    confusion  . Promethazine Hcl Other (See Comments)    confusion  . Amiodarone Other  (See Comments)    Lung problem per daughter  . Procainamide Other (See Comments)    Lupus reaction  . Warfarin And Related Other (See Comments)    Abdominal bleed  . Amoxicillin Rash  . Ciprofloxacin Rash  . Polocaine [Mepivacaine Hcl] Palpitations and Rash  . Septra [Sulfamethoxazole W/Trimethoprim (Co-Trimoxazole)] Rash and Other (See Comments)    Dizziness and tremor "don't remember how bad the reaction was" (04/24/2012)    Social History:  Ambulatory  walker cane  Lives at home alone but sitter there 3 nights a week.      reports that she has never smoked. She has never used smokeless tobacco. She reports that she does not drink alcohol or use illicit drugs.    Family History: family history includes Heart disease in her mother; Hypertension in her mother.    Physical Exam: Patient Vitals for the past 24 hrs:  BP Temp Temp src Pulse Resp SpO2  07/11/14 2323 150/68 mmHg 97.4 F (36.3 C) Oral 94 18 94 %  07/11/14 2046 158/79 mmHg 97.4 F (36.3 C) Oral 75 18 90 %  07/11/14 2045 - - - - - 98 %  07/11/14 2038 - - - - - 93 %    1. General:  in No Acute distress 2. Psychological: Alert and   Oriented 3. Head/ENT:   Moist Mucous Membranes                          Head Non traumatic, neck supple                          Normal  Dentition 4. SKIN: normal  Skin turgor,  Skin clean Dry and intact no rash 5. Heart: Regular rate and rhythm no Murmur, Rub or gallop 6. Lungs:   no wheezes or crackles   7. Abdomen: Soft, non-tender, Non distended 8. Lower extremities: no clubbing, cyanosis, trace edema 9. Neurologically Grossly intact, moving all 4 extremities equally 10. MSK: Decreased range of motion in left hip  body mass index is unknown because there is no weight on file.   Labs on Admission:   Results for orders placed or performed during the hospital encounter of 07/11/14 (from the past 24 hour(s))  CBC WITH DIFFERENTIAL     Status: Abnormal   Collection Time:  07/11/14 11:33 PM  Result Value Ref Range  WBC 13.6 (H) 4.0 - 10.5 K/uL   RBC 4.05 3.87 - 5.11 MIL/uL   Hemoglobin 11.9 (L) 12.0 - 15.0 g/dL   HCT 84.137.2 32.436.0 - 40.146.0 %   MCV 91.9 78.0 - 100.0 fL   MCH 29.4 26.0 - 34.0 pg   MCHC 32.0 30.0 - 36.0 g/dL   RDW 02.716.8 (H) 25.311.5 - 66.415.5 %   Platelets 241 150 - 400 K/uL   Neutrophils Relative % 82 (H) 43 - 77 %   Neutro Abs 11.1 (H) 1.7 - 7.7 K/uL   Lymphocytes Relative 12 12 - 46 %   Lymphs Abs 1.6 0.7 - 4.0 K/uL   Monocytes Relative 6 3 - 12 %   Monocytes Absolute 0.8 0.1 - 1.0 K/uL   Eosinophils Relative 0 0 - 5 %   Eosinophils Absolute 0.0 0.0 - 0.7 K/uL   Basophils Relative 0 0 - 1 %   Basophils Absolute 0.0 0.0 - 0.1 K/uL  Protime-INR     Status: None   Collection Time: 07/11/14 11:33 PM  Result Value Ref Range   Prothrombin Time 14.3 11.6 - 15.2 seconds   INR 1.10 0.00 - 1.49    UA willl obtain   Lab Results  Component Value Date   HGBA1C 5.6 04/13/2012    CrCl cannot be calculated (Unknown ideal weight.).  BNP (last 3 results)  Recent Labs  07/25/13 1215  PROBNP 4206.0*    Other results:  I have pearsonaly reviewed this: ECG REPORT  Rate: 80  Rhythm: a.fib ST&T Change: ST depression in V4 V5 V6 Pierce to been changed from prior    There were no vitals filed for this visit.   Cultures:    Component Value Date/Time   SDES URINE, RANDOM 04/24/2012 1330   SPECREQUEST NONE 04/24/2012 1330   CULT  04/24/2012 1330    Multiple bacterial morphotypes present, none predominant. Suggest appropriate recollection if clinically indicated.   REPTSTATUS 04/25/2012 FINAL 04/24/2012 1330     Radiological Exams on Admission: Dg Chest 1 View  07/11/2014   CLINICAL DATA:  Status post fall. Respiratory failure. Initial encounter.  EXAM: CHEST  1 VIEW  COMPARISON:  Chest radiograph performed 07/25/2013  FINDINGS: The lungs are well-aerated. Pulmonary vascularity is at the upper limits of normal. Densities at the lung bases  reflect overlying costochondral calcification. There is no evidence of focal opacification, pleural effusion or pneumothorax.  The cardiomediastinal silhouette is borderline enlarged. A pacemaker is noted overlying the left chest wall, with leads ending overlying the right atrium and right ventricle. No acute osseous abnormalities are seen. Left proximal humeral hardware is grossly unremarkable, though incompletely imaged.  IMPRESSION: No acute cardiopulmonary process seen; borderline cardiomegaly. No displaced rib fractures identified.   Electronically Signed   By: Roanna RaiderJeffery  Chang M.D.   On: 07/11/2014 22:55   Dg Hip Unilat With Pelvis 2-3 Views Left  07/11/2014   CLINICAL DATA:  Fall this evening with left hip pain.  EXAM: LEFT HIP (WITH PELVIS) 2-3 VIEWS  COMPARISON:  None.  FINDINGS: Right hip arthroplasty. Lower lumbar spondylosis. Vascular calcifications.  Comminuted intratrochanteric left femur fracture.  No dislocation  IMPRESSION: Comminuted intratrochanteric left femur fracture.   Electronically Signed   By: Jeronimo GreavesKyle  Talbot M.D.   On: 07/11/2014 22:45    Chart has been reviewed  Assessment/Plan   81 year old female with extensive cardiac history including atrial fibrillation not on anticoagulation due to history of spontaneous bleeding, coronary artery disease, status post non-ST elevation  MI, history of diastolic heart failure on Lasix and metolazone requiring chronic oxygen use. History of pulmonary hypertension. Reasons after mechanical fall resulting in left hip fracture  Present on Admission:  . Femur fracture, left - as per orthopedics. [place] [facility] has been consulted and aware of the patient. Given her advanced age and multiple cardiac conditions patient is moderate to high risk. Recommend cardiology preop clearance given extensive history and elevated troponin. Ordered echogram. . Essential hypertension . Chronic kidney disease, stage III (moderate) - stable continue to monitor  creatinine  . Chronic diastolic heart failure - currently appears to be stable well compensated. While nothing by mouth continue Lasix IV avoid fluid overload  . Atrial fibrillation- While nothing by mouth Will give IV metoprolol instead of atenolol  . Elevated troponin - most likely secondary to heart failure and strain due to recent fall and fracture. Will continue to cycle cardiac enzymes. Obtain echogram in the morning. Cardiology is aware we'll consult   hypokalemia will replace and follow   Prophylaxis: SCD , Protonix  CODE STATUS:  FULL CODE    Other plan as per orders.  I have spent a total of 65 min on this admissionextra time was taken to discuss care with cardiology  Tymar Polyak 07/12/2014, 12:16 AM  [facility]  Pager [phone]   after 2 AM please page floor coverage PA If 7AM-7PM, please contact the day team taking care of the patient  [email or web address]  Password [email or web address]

## 2014-07-12 NOTE — Progress Notes (Signed)
Echocardiogram 2D Echocardiogram has been performed.  Danielle GrumblesMyers, Skai Lickteig J 07/12/2014, 11:20 AM

## 2014-07-13 ENCOUNTER — Inpatient Hospital Stay (HOSPITAL_COMMUNITY): Payer: MEDICARE

## 2014-07-13 ENCOUNTER — Encounter (HOSPITAL_COMMUNITY)
Admission: EM | Disposition: A | Discharge status: Skilled Nursing Facility | Payer: Self-pay | Source: Home / Self Care | Attending: Internal Medicine

## 2014-07-13 ENCOUNTER — Encounter (HOSPITAL_COMMUNITY): Payer: Self-pay | Admitting: Anesthesiology

## 2014-07-13 DIAGNOSIS — I509 Heart failure, unspecified: Secondary | ICD-10-CM

## 2014-07-13 DIAGNOSIS — I482 Chronic atrial fibrillation: Secondary | ICD-10-CM

## 2014-07-13 LAB — CBC
HCT: 36.2 % (ref 36.0–46.0)
Hemoglobin: 11 g/dL — ABNORMAL LOW (ref 12.0–15.0)
MCH: 28.4 pg (ref 26.0–34.0)
MCHC: 30.4 g/dL (ref 30.0–36.0)
MCV: 93.5 fL (ref 78.0–100.0)
Platelets: 201 10*3/uL (ref 150–400)
RBC: 3.87 MIL/uL (ref 3.87–5.11)
RDW: 17 % — AB (ref 11.5–15.5)
WBC: 11.6 10*3/uL — AB (ref 4.0–10.5)

## 2014-07-13 LAB — BASIC METABOLIC PANEL
ANION GAP: 5 (ref 5–15)
BUN: 29 mg/dL — AB (ref 6–23)
CALCIUM: 8.8 mg/dL (ref 8.4–10.5)
CHLORIDE: 107 mmol/L (ref 96–112)
CO2: 28 mmol/L (ref 19–32)
Creatinine, Ser: 1.65 mg/dL — ABNORMAL HIGH (ref 0.50–1.10)
GFR calc Af Amer: 30 mL/min — ABNORMAL LOW (ref >90)
GFR calc non Af Amer: 26 mL/min — ABNORMAL LOW (ref >90)
Glucose, Bld: 109 mg/dL — ABNORMAL HIGH (ref 70–99)
Potassium: 4 mmol/L (ref 3.5–5.1)
Sodium: 140 mmol/L (ref 135–145)

## 2014-07-13 LAB — URINE CULTURE
CULTURE: NO GROWTH
Colony Count: NO GROWTH
SPECIAL REQUESTS: NORMAL

## 2014-07-13 SURGERY — FIXATION, FRACTURE, INTERTROCHANTERIC, WITH INTRAMEDULLARY ROD
Anesthesia: General | Laterality: Left

## 2014-07-13 MED ORDER — FENTANYL CITRATE 0.05 MG/ML IJ SOLN
INTRAMUSCULAR | Status: DC | PRN
  Administered 2014-07-13: 25 ug via INTRAVENOUS

## 2014-07-13 MED ORDER — ISOPROPYL ALCOHOL 70 % SOLN
Status: AC
  Filled 2014-07-13: qty 480

## 2014-07-13 MED ORDER — HYDROGEN PEROXIDE 3 % EX SOLN
CUTANEOUS | Status: AC
  Filled 2014-07-13: qty 473

## 2014-07-13 MED ORDER — PROPOFOL 10 MG/ML IV BOLUS
INTRAVENOUS | Status: AC
  Filled 2014-07-13: qty 20

## 2014-07-13 MED ORDER — FUROSEMIDE 10 MG/ML IJ SOLN
80.0000 mg | Freq: Two times a day (BID) | INTRAMUSCULAR | Status: DC
  Administered 2014-07-13 – 2014-07-17 (×9): 80 mg via INTRAVENOUS
  Filled 2014-07-13 (×9): qty 8

## 2014-07-13 MED ORDER — KETAMINE HCL 10 MG/ML IJ SOLN
INTRAMUSCULAR | Status: AC
  Filled 2014-07-13: qty 1

## 2014-07-13 MED ORDER — LEVOTHYROXINE SODIUM 88 MCG PO TABS
88.0000 ug | ORAL_TABLET | Freq: Every day | ORAL | Status: DC
  Administered 2014-07-14 – 2014-07-22 (×9): 88 ug via ORAL
  Filled 2014-07-13 (×9): qty 1

## 2014-07-13 MED ORDER — PROPOFOL 10 MG/ML IV BOLUS
INTRAVENOUS | Status: AC
Start: 2014-07-13 — End: 2014-07-13
  Filled 2014-07-13: qty 20

## 2014-07-13 MED ORDER — FENTANYL CITRATE 0.05 MG/ML IJ SOLN
INTRAMUSCULAR | Status: AC
  Filled 2014-07-13: qty 2

## 2014-07-13 MED ORDER — FUROSEMIDE 10 MG/ML IJ SOLN
INTRAMUSCULAR | Status: AC
  Administered 2014-07-13: 80 mg
  Filled 2014-07-13: qty 8

## 2014-07-13 MED ORDER — FUROSEMIDE 10 MG/ML IJ SOLN: 80.0000 mg | Freq: Two times a day (BID) | INTRAMUSCULAR | Status: DC

## 2014-07-13 MED ORDER — PANTOPRAZOLE SODIUM 40 MG PO TBEC
40.0000 mg | DELAYED_RELEASE_TABLET | Freq: Every day | ORAL | Status: DC
  Administered 2014-07-13 – 2014-07-21 (×8): 40 mg via ORAL
  Filled 2014-07-13 (×7): qty 1

## 2014-07-13 NOTE — H&P (View-Only) (Signed)
Reason for Consult:left hip fracture Referring Physician: TRH/EDP  HPI: [name] is an 81 y.o. female with a history of HTN, PAF not anticoagulated 2/2 RP bleed, SSS s/p PPM, diastolic HF who presents with L femur fracture. She states that earlier today she tripped on her steps to the kitchen with subsequent mechanical fall. She reports that she was reasonably active at home but did not leave home on her home. She is able to perform all ADLs without difficulty as well as light housework but does not vacuum, etc. She lives alone w/ family closeby and she has a caretaker who helps her. She is on chronic O2 thought to be 2/2 diastolic HF that she wears all the time. Re: CHF, she takes lasix daily with metolazone 2-3x/wk to augment diuresis.  Past Medical History  Diagnosis Date  . Essential hypertension     controlled  . Osteoporosis   . Persistent atrial fibrillation     probably permanent  . Vertigo   . Chronic renal insufficiency   . Hypothyroidism   . Arthritis   . Anxiety   . On home oxygen therapy     uses O2 @ 2l/m nasally bedtime  . Tachy-brady syndrome      treated with amiodarone and permanent DDD pacemaker, 1993  . GERD (gastroesophageal reflux disease)   . Atrial flutter   . Encounter for long-term (current) use of other medications     prior bleeding with coumadin, presently on asa  . Chronic diastolic heart failure   . Coronary atherosclerosis of unspecified type of vessel, native or graft     asymptomatic  . Anemia, unspecified   . Depression   . Retroperitoneal bleed 2005    LIFE THREATENING spontaneous retroperitoneal bleed on coumadin therapy 2005  . Respiratory failure 07/2011    Past Surgical History  Procedure Laterality Date  . Knee arthroscopy Right   . Total hip arthroplasty Right   . Esophagogastroduodenoscopy N/A 11/26/2012    Procedure: ESOPHAGOGASTRODUODENOSCOPY (EGD);  Surgeon: Juanita Craver, MD;  Location: [facility];  Service: Endoscopy;   Laterality: N/A;  . Joint replacement      rt hip  . Shoulder surgery    . Esophagogastroduodenoscopy N/A 03/08/2013    Procedure: ESOPHAGOGASTRODUODENOSCOPY (EGD);  Surgeon: Beryle Beams, MD;  Location: [facility];  Service: Endoscopy;  Laterality: N/A;  . Pacemaker insertion    . Cholecystectomy  10/08/1997    PPM implanted by Dr Leonia Reeves for tachy/brady syndrome  . Pacemaker generator change  6/10/20087    generator change Zephyr XL DR by Dr Leonia Reeves    Family History  Problem Relation Age of Onset  . Hypertension Mother   . Heart disease Mother     Heart failure (vague history)    Social History:  reports that she has never smoked. She has never used smokeless tobacco. She reports that she does not drink alcohol or use illicit drugs.  Allergies:  Allergies  Allergen Reactions  . Quinidine Other (See Comments)    Skin peels  . Tiazac [Diltiazem Hcl] Hives    "don't remember how bad the reaction was" (04/24/2012)  . Warfarin Sodium Other (See Comments)    Life-threatening retroperitoneal bleed in 2005  . Percocet [Oxycodone-Acetaminophen] Other (See Comments)    confusion  . Promethazine Hcl Other (See Comments)    confusion  . Amiodarone Other (See Comments)    Lung problem per daughter  . Procainamide Other (See Comments)    Lupus reaction  .  Warfarin And Related Other (See Comments)    Abdominal bleed  . Amoxicillin Rash  . Ciprofloxacin Rash  . Polocaine [Mepivacaine Hcl] Palpitations and Rash  . Septra [Sulfamethoxazole W/Trimethoprim (Co-Trimoxazole)] Rash and Other (See Comments)    Dizziness and tremor "don't remember how bad the reaction was" (04/24/2012)    Medications:  Prior to Admission:  Prescriptions prior to admission  Medication Sig Dispense Refill Last Dose  . acetaminophen (TYLENOL ARTHRITIS PAIN) 650 MG CR tablet Take 650 mg by mouth 2 (two) times daily.    Past Week at Unknown time  . amLODipine (NORVASC) 5 MG tablet Take 1 tablet by mouth  once a day 90 tablet 1 07/11/2014 at Unknown time  . aspirin EC 81 MG tablet Take 81 mg by mouth daily.   07/11/2014 at Unknown time  . atenolol (TENORMIN) 25 MG tablet Take 1 tablet by mouth two  times daily 180 tablet 1 07/11/2014 at Unknown time  . buPROPion (WELLBUTRIN XL) 300 MG 24 hr tablet Take 300 mg by mouth daily.   07/11/2014 at Unknown time  . Cholecalciferol (VITAMIN D3) 2000 UNITS TABS Take 2,000 Units by mouth daily.   07/11/2014 at Unknown time  . estradiol (ESTRACE) 0.1 MG/GM vaginal cream Place 2 g vaginally at bedtime.    07/10/2014 at Unknown time  . furosemide (LASIX) 80 MG tablet Take 1.5 tablets (120 mg  total) by mouth daily. (Patient taking differently: 80mg  once a day) 135 tablet 1 07/11/2014 at Unknown time  . levothyroxine (SYNTHROID, LEVOTHROID) 88 MCG tablet Take 1 tablet by mouth  daily before breakfast 90 tablet 1 07/11/2014 at Unknown time  . metolazone (ZAROXOLYN) 2.5 MG tablet Take 1 tablet (2.5 mg total) by mouth once a week. Take 30 minutes before Lasix on Mondays 12 tablet 1 07/11/2014 at Unknown time  . Multiple Vitamins-Minerals (ICAPS PO) Take 1 capsule by mouth daily.   07/11/2014 at Unknown time  . NON FORMULARY Take 1 tablet by mouth daily. Ultra Flora IB once a day   07/11/2014 at Unknown time  . omeprazole (PRILOSEC) 20 MG capsule Take 40 mg by mouth daily.    07/11/2014 at Unknown time  . potassium chloride (K-DUR) 10 MEQ tablet Take 1 tablet by mouth  daily 90 tablet 1 07/11/2014 at Unknown time  . potassium chloride (K-DUR,KLOR-CON) 10 MEQ tablet Take 1 tablet (10 mEq total) by mouth daily. 90 tablet 1 07/11/2014 at Unknown time  . traMADol (ULTRAM) 50 MG tablet Take 50 mg by mouth daily as needed (pain).   07/10/2014 at Unknown time  . trimethoprim (TRIMPEX) 100 MG tablet Take 100 mg by mouth daily.   07/11/2014 at Unknown time  . hyoscyamine (LEVSIN SL) 0.125 MG SL tablet PLACE 1 TABLET (0.125 MG TOTAL) UNDER THE TONGUE EVERY 4 (FOUR) HOURS AS NEEDED FOR CRAMPING. 15 tablet 0  Taking  . loperamide (IMODIUM A-D) 2 MG tablet Take 2-4 mg by mouth as needed for diarrhea or loose stools. Take 2 tablets at first loose stool, then take 1 tablet as needed for more diarrhea   unknown at unknown  . mupirocin ointment (BACTROBAN) 2 %    Taking  . ondansetron (ZOFRAN) 4 MG tablet Take 4 mg by mouth every 6 (six) hours as needed for nausea or vomiting.   unknown at unknown    Results for orders placed or performed during the hospital encounter of 07/11/14 (from the past 48 hour(s))  Brain natriuretic peptide     Status:  Abnormal   Collection Time: 07/11/14 11:28 PM  Result Value Ref Range   B Natriuretic Peptide 299.6 (H) 0.0 - 100.0 pg/mL  Troponin I     Status: Abnormal   Collection Time: 07/11/14 11:28 PM  Result Value Ref Range   Troponin I 0.11 (H) <0.031 ng/mL    Comment:        PERSISTENTLY INCREASED TROPONIN VALUES IN THE RANGE OF 0.04-0.49 ng/mL CAN BE SEEN IN:       -UNSTABLE ANGINA       -CONGESTIVE HEART FAILURE       -MYOCARDITIS       -CHEST TRAUMA       -ARRYHTHMIAS       -LATE PRESENTING MYOCARDIAL INFARCTION       -COPD   CLINICAL FOLLOW-UP RECOMMENDED.   Basic metabolic panel     Status: Abnormal   Collection Time: 07/11/14 11:33 PM  Result Value Ref Range   Sodium 136 135 - 145 mmol/L   Potassium 3.3 (L) 3.5 - 5.1 mmol/L   Chloride 102 96 - 112 mmol/L   CO2 26 19 - 32 mmol/L   Glucose, Bld 130 (H) 70 - 99 mg/dL   BUN 39 (H) 6 - 23 mg/dL   Creatinine, Ser 2.11 (H) 0.50 - 1.10 mg/dL   Calcium 9.3 8.4 - 10.5 mg/dL   GFR calc non Af Amer 19 (L) >90 mL/min   GFR calc Af Amer 22 (L) >90 mL/min    Comment: (NOTE) The eGFR has been calculated using the CKD EPI equation. This calculation has not been validated in all clinical situations. eGFR's persistently <90 mL/min signify possible Chronic Kidney Disease.    Anion gap 8 5 - 15  CBC WITH DIFFERENTIAL     Status: Abnormal   Collection Time: 07/11/14 11:33 PM  Result Value Ref Range   WBC  13.6 (H) 4.0 - 10.5 K/uL   RBC 4.05 3.87 - 5.11 MIL/uL   Hemoglobin 11.9 (L) 12.0 - 15.0 g/dL   HCT 37.2 36.0 - 46.0 %   MCV 91.9 78.0 - 100.0 fL   MCH 29.4 26.0 - 34.0 pg   MCHC 32.0 30.0 - 36.0 g/dL   RDW 16.8 (H) 11.5 - 15.5 %   Platelets 241 150 - 400 K/uL   Neutrophils Relative % 82 (H) 43 - 77 %   Neutro Abs 11.1 (H) 1.7 - 7.7 K/uL   Lymphocytes Relative 12 12 - 46 %   Lymphs Abs 1.6 0.7 - 4.0 K/uL   Monocytes Relative 6 3 - 12 %   Monocytes Absolute 0.8 0.1 - 1.0 K/uL   Eosinophils Relative 0 0 - 5 %   Eosinophils Absolute 0.0 0.0 - 0.7 K/uL   Basophils Relative 0 0 - 1 %   Basophils Absolute 0.0 0.0 - 0.1 K/uL  Protime-INR     Status: None   Collection Time: 07/11/14 11:33 PM  Result Value Ref Range   Prothrombin Time 14.3 11.6 - 15.2 seconds   INR 1.10 0.00 - 1.49  Type and screen     Status: None   Collection Time: 07/11/14 11:33 PM  Result Value Ref Range   ABO/RH(D) A POS    Antibody Screen NEG    Sample Expiration 07/14/2014   Urinalysis, Routine w reflex microscopic     Status: None   Collection Time: 07/12/14  2:21 AM  Result Value Ref Range   Color, Urine YELLOW YELLOW   APPearance CLEAR CLEAR  Specific Gravity, Urine 1.013 1.005 - 1.030   pH 6.5 5.0 - 8.0   Glucose, UA NEGATIVE NEGATIVE mg/dL   Hgb urine dipstick NEGATIVE NEGATIVE   Bilirubin Urine NEGATIVE NEGATIVE   Ketones, ur NEGATIVE NEGATIVE mg/dL   Protein, ur NEGATIVE NEGATIVE mg/dL   Urobilinogen, UA 0.2 0.0 - 1.0 mg/dL   Nitrite NEGATIVE NEGATIVE   Leukocytes, UA NEGATIVE NEGATIVE    Comment: MICROSCOPIC NOT DONE ON URINES WITH NEGATIVE PROTEIN, BLOOD, LEUKOCYTES, NITRITE, OR GLUCOSE <1000 mg/dL.  Troponin I (q 6hr x 3)     Status: Abnormal   Collection Time: 07/12/14  3:20 AM  Result Value Ref Range   Troponin I 0.11 (H) <0.031 ng/mL    Comment:        PERSISTENTLY INCREASED TROPONIN VALUES IN THE RANGE OF 0.04-0.49 ng/mL CAN BE SEEN IN:       -UNSTABLE ANGINA       -CONGESTIVE HEART  FAILURE       -MYOCARDITIS       -CHEST TRAUMA       -ARRYHTHMIAS       -LATE PRESENTING MYOCARDIAL INFARCTION       -COPD   CLINICAL FOLLOW-UP RECOMMENDED.   Comprehensive metabolic panel     Status: Abnormal   Collection Time: 07/12/14  3:20 AM  Result Value Ref Range   Sodium 137 135 - 145 mmol/L   Potassium 3.2 (L) 3.5 - 5.1 mmol/L   Chloride 101 96 - 112 mmol/L   CO2 29 19 - 32 mmol/L   Glucose, Bld 117 (H) 70 - 99 mg/dL   BUN 39 (H) 6 - 23 mg/dL   Creatinine, Ser 1.94 (H) 0.50 - 1.10 mg/dL   Calcium 8.7 8.4 - 10.5 mg/dL   Total Protein 6.1 6.0 - 8.3 g/dL   Albumin 3.6 3.5 - 5.2 g/dL   AST 22 0 - 37 U/L   ALT 13 0 - 35 U/L   Alkaline Phosphatase 83 39 - 117 U/L   Total Bilirubin 0.8 0.3 - 1.2 mg/dL   GFR calc non Af Amer 21 (L) >90 mL/min   GFR calc Af Amer 25 (L) >90 mL/min    Comment: (NOTE) The eGFR has been calculated using the CKD EPI equation. This calculation has not been validated in all clinical situations. eGFR's persistently <90 mL/min signify possible Chronic Kidney Disease.    Anion gap 7 5 - 15  CBC WITH DIFFERENTIAL     Status: Abnormal   Collection Time: 07/12/14  3:20 AM  Result Value Ref Range   WBC 12.9 (H) 4.0 - 10.5 K/uL   RBC 3.94 3.87 - 5.11 MIL/uL   Hemoglobin 11.3 (L) 12.0 - 15.0 g/dL   HCT 36.1 36.0 - 46.0 %   MCV 91.6 78.0 - 100.0 fL   MCH 28.7 26.0 - 34.0 pg   MCHC 31.3 30.0 - 36.0 g/dL   RDW 16.8 (H) 11.5 - 15.5 %   Platelets 237 150 - 400 K/uL   Neutrophils Relative % 73 43 - 77 %   Neutro Abs 9.5 (H) 1.7 - 7.7 K/uL   Lymphocytes Relative 15 12 - 46 %   Lymphs Abs 1.9 0.7 - 4.0 K/uL   Monocytes Relative 11 3 - 12 %   Monocytes Absolute 1.4 (H) 0.1 - 1.0 K/uL   Eosinophils Relative 1 0 - 5 %   Eosinophils Absolute 0.1 0.0 - 0.7 K/uL   Basophils Relative 0 0 - 1 %  Basophils Absolute 0.0 0.0 - 0.1 K/uL    Dg Chest 1 View  07/11/2014   CLINICAL DATA:  Status post fall. Respiratory failure. Initial encounter.  EXAM: CHEST  1  VIEW  COMPARISON:  Chest radiograph performed 07/25/2013  FINDINGS: The lungs are well-aerated. Pulmonary vascularity is at the upper limits of normal. Densities at the lung bases reflect overlying costochondral calcification. There is no evidence of focal opacification, pleural effusion or pneumothorax.  The cardiomediastinal silhouette is borderline enlarged. A pacemaker is noted overlying the left chest wall, with leads ending overlying the right atrium and right ventricle. No acute osseous abnormalities are seen. Left proximal humeral hardware is grossly unremarkable, though incompletely imaged.  IMPRESSION: No acute cardiopulmonary process seen; borderline cardiomegaly. No displaced rib fractures identified.   Electronically Signed   By: Garald Balding M.D.   On: 07/11/2014 22:55   Dg Hip Unilat With Pelvis 2-3 Views Left  07/11/2014   CLINICAL DATA:  Fall this evening with left hip pain.  EXAM: LEFT HIP (WITH PELVIS) 2-3 VIEWS  COMPARISON:  None.  FINDINGS: Right hip arthroplasty. Lower lumbar spondylosis. Vascular calcifications.  Comminuted intratrochanteric left femur fracture.  No dislocation  IMPRESSION: Comminuted intratrochanteric left femur fracture.   Electronically Signed   By: Abigail Miyamoto M.D.   On: 07/11/2014 22:45     Vitals Temp:  [97.4 F (36.3 C)-98.3 F (36.8 C)] 98.3 F (36.8 C) (03/05 0508) Pulse Rate:  [67-94] 67 (03/05 0508) Resp:  [16-20] 20 (03/05 0508) BP: (129-158)/(61-79) 132/73 mmHg (03/05 0508) SpO2:  [90 %-99 %] 99 % (03/05 0508) Weight:  [60.328 kg (133 lb)] 60.328 kg (133 lb) (03/05 0020) Body mass index is 24.32 kg/(m^2).  Physical Exam: Thin WF in extreme discomfort, A and O. No bone or joint tenderness or instability in UE's, n/v intact UE's. Left hip diffusely tender, extreme pain with any attempts at mobilization. N/v intact distally LLE, fair ankle and digital motion, decreased secondary to pain. RLE stable    Assessment/Plan: Impression:  left  intertrochanteric hip fracture Extensive underlying medical comorbidities including severe pulmonary HTN and grade 4 renal insufficiency.  Treatment: I have discussed with patient treatment options and risks vs benefits thereof. I have also spoken with medical team and Dr. Caryl Comes. She is at heightened risk for perioperative morbidity/mortality, and a preop echocardiogram has been recommended.  Given extreme pain and functional limitations imposed by recent hip fracture, surgical stabilization is preferred course of management from orthopedic perspective. Unless compelling issues identified on echo, anticipate surgery Sunday, and I will communicate to my partners on call who will be available to provide definitive orthopedic care.  Attikus Bartoszek M 07/12/2014, 9:18 AM

## 2014-07-13 NOTE — Progress Notes (Signed)
Cardiology called preop with concern over increasing O2 requirement o/n. They cancelled the surgery for today and would like to diurese her before clearing her for OR. We will plan for surgery when is is medically optimized.  Discussed with daughter who is agreeable.

## 2014-07-13 NOTE — Progress Notes (Signed)
Patient ID: [name], female   DOB: [birth year], 81 y.o.   MRN: [id number]  [facility] PROGRESS NOTE  [name]:[id number] DOB: 1919-08-19 DOA: 07/11/2014 PCP: Carollee Herter, MD  Brief narrative:    81 y.o. female with HTN, atrial fibrillation but not on AC due to bleeding (was on Coumadin in 2005 but that was stopped), CKD stage IV, diastolic CHF, on oxygen at home, per family member also unknown lung injury from Amiodarone (pt was on for 8 years), PM [employer] (DDD with atrial undersensing) COPD, severe pulm HTN, presented to [facility] ED after an episode of fall and has subsequently sustained left hip fracture. Due to multiple co morbid conditions, cariology for consulted for pre op clearance prior to surgery.   Events since admission: 3/4 - admitted to telemetry unit, trop elevated 0.11 3/5 - persistently hypoxic with O2 sat in 80's on 6 L [place] and up to low 90's with venti mask, transferred to SDU   Assessment/Plan:    Active Problems:   Acute on chronic hypoxic respiratory failure - multifactorial and secondary to acute on chronic diastolic CHF, a-fib, COPD, pulm HTN - still with oxygen demand - needs close observation in SDU  - appreciate cardiology following - will continue diuresis with Lasix under cardiology surveillance - CXR pending this AM - holding off on surgery for now until pulmonary status stabilized   - weight trend since admission: 133 lbs --> 135 lbs - continue to monitor daily weights, strict I's and O's    Atrial fibrillation - rate controlled for now  - continue Atenolol     Elevated troponin - likely demand ischemia  - no need for further cycling  - pt with no chest pain this AM    Essential hypertension - SBP in 90's - close monitoring while on high dose of Lasix     Acute on chronic diastolic heart failure - management with Lasix as noted above     Chronic kidney disease, stage IV - Cr trending down 2.11 --> 1.65 - repeat  BMP in AM    Femur fracture, left - management per ortho - hold off on surgery until pulmonary status stabilized     Hypokalemia - supplemented and WNL    Hypothyroidism  - check TSH - continue synthroid     Severe PCM - in the context of acute illness - advance diet since no surgery planned today   DVT prophylaxis - Lovenox   Code Status: Full.  Family Communication:  plan of care discussed with daughter [name] over the phone and another daughter at bedside  Disposition Plan: Keep in SDU   IV access:  Peripheral IV  Procedures and diagnostic studies:    Dg Chest 1 View  07/11/2014 No acute cardiopulmonary process seen; borderline cardiomegaly. No displaced rib fractures identified.    Dg Chest Port 1 View  07/13/2014  Increased diffuse interstitial infiltrates, suspicious for interstitial edema. Stable cardiomegaly.     Dg Hip Unilat With Pelvis 2-3 Views Left  07/11/2014  Comminuted intratrochanteric left femur fracture.   Dg Femur Min 2 Views Left  07/12/2014 Comminuted intertrochanteric left femur fracture with varus angulation.     Medical Consultants:  Cardiology Ortho   Other Consultants:  None  IAnti-Infectives:   None   Debbora Presto, MD  [facility] Pager [phone]  If 7PM-7AM, please contact night-coverage [email or web address] Password [email or web address] 07/13/2014, 1:30 PM   LOS: 1 day   HPI/Subjective: No events overnight.  Objective: Filed Vitals:   07/13/14 1000 07/13/14 1100 07/13/14 1200 07/13/14 1300  BP: 114/45 121/46 117/98 98/31  Pulse: 97 87 81 80  Temp:      TempSrc:      Resp: 15 15 21 25   Height:      Weight:      SpO2: 97% 96% 92% 95%    Intake/Output Summary (Last 24 hours) at 07/13/14 1330 Last data filed at 07/13/14 1300  Gross per 24 hour  Intake    690 ml  Output   1030 ml  Net   -340 ml    Exam:   General:  Pt is alert, follows commands appropriately, frail and weak   Cardiovascular: Regular rate and rhythm, no rubs, no  gallops  Respiratory: Clear to auscultation bilaterally, bibasilar crackles   Abdomen: Soft, non tender, non distended, bowel sounds present, no guarding  Extremities: pulses DP and PT palpable bilaterally  Neuro: Grossly nonfocal  Data Reviewed: Basic Metabolic Panel:  Recent Labs Lab 07/11/14 2333 07/12/14 0320 07/13/14 0405  NA 136 137 140  K 3.3* 3.2* 4.0  CL 102 101 107  CO2 26 29 28   GLUCOSE 130* 117* 109*  BUN 39* 39* 29*  CREATININE 2.11* 1.94* 1.65*  CALCIUM 9.3 8.7 8.8   Liver Function Tests:  Recent Labs Lab 07/12/14 0320  AST 22  ALT 13  ALKPHOS 83  BILITOT 0.8  PROT 6.1  ALBUMIN 3.6   CBC:  Recent Labs Lab 07/11/14 2333 07/12/14 0320 07/13/14 0405  WBC 13.6* 12.9* 11.6*  NEUTROABS 11.1* 9.5*  --   HGB 11.9* 11.3* 11.0*  HCT 37.2 36.1 36.2  MCV 91.9 91.6 93.5  PLT 241 237 201   Cardiac Enzymes:  Recent Labs Lab 07/11/14 2328 07/12/14 0320 07/12/14 0914 07/12/14 1508  TROPONINI 0.11* 0.11* 0.12* 0.11*    Recent Results (from the past 240 hour(s))  Urine culture     Status: None   Collection Time: 07/12/14  2:25 AM  Result Value Ref Range Status   Specimen Description URINE, CATHETERIZED  Final   Special Requests Normal  Final   Colony Count NO GROWTH [facility]formed at [facility]   Final   Culture NO GROWTH Performed at [facility]   Final   Report Status 07/13/2014 FINAL  Final  Surgical pcr screen     Status: None   Collection Time: 07/12/14 12:54 PM  Result Value Ref Range Status   MRSA, PCR NEGATIVE NEGATIVE Final   Staphylococcus aureus NEGATIVE NEGATIVE Final     Scheduled Meds: . atenolol  25 mg Oral BID  . buPROPion  300 mg Oral Daily  . docusate sodium  100 mg Oral BID  . furosemide  80 mg Intravenous BID  . levothyroxine  50 mcg Intravenous Daily  . pantoprazole (PROTONIX) IV  40 mg Intravenous QHS   Continuous Infusions: . sodium chloride 500 mL (07/13/14 0900)

## 2014-07-13 NOTE — Progress Notes (Signed)
MD-please order best choice for anticoagulation therapy after surgery per request of orthopedic surgeon. Thanks!

## 2014-07-13 NOTE — Interval H&P Note (Signed)
History and Physical Interval Note:  07/13/2014 7:30 AM  [name]  has presented today for surgery, with the diagnosis of left intertrochanteric fracture  The various methods of treatment have been discussed with the patient and family. After consideration of risks, benefits and other options for treatment, the patient has consented to  Procedure(s): INTRAMEDULLARY (IM) NAIL INTERTROCHANTRIC (Left) as a surgical intervention .  The patient's history has been reviewed, patient examined, no change in status, stable for surgery.  I have reviewed the patient's chart and labs.  Questions were answered to the patient's satisfaction.    The patient had cardiology clearance last night. I was asked to assume care by my partner, Dr. Victorino DikeHewitt. I discussed the situation with the patient and her family. She is high risk for perioperative medical complications. However, if we do nothing, she still almost certainly have a bad outcome due to immobility. The risks, benefits, and alternatives were discussed with the patient. There are risks associated with the surgery including, but not limited to, problems with anesthesia (death), infection, dislocation, infection, instability (giving out of the joint), differences in leg length/angulation/rotation, fracture of bones, failure of implants, hematoma (blood accumulation) which may require surgical drainage, blood clots, pulmonary embolism, nerve injury (foot drop), and blood vessel injury. The patient understands these risks and elects to proceed.    Airyn Ellzey, Cloyde ReamsBrian James

## 2014-07-13 NOTE — Anesthesia Preprocedure Evaluation (Addendum)
Anesthesia Evaluation  Patient identified by MRN, date of birth, ID band Patient awake  General Assessment Comment: Essential hypertension    controlled . Osteoporosis  . Persistent atrial fibrillation    probably permanent . Vertigo  . Chronic renal insufficiency  . Hypothyroidism  . Arthritis  . Anxiety  . On home oxygen therapy    uses O2 @ 2l/m nasally bedtime . Tachy-brady syndrome    treated with amiodarone and permanent DDD pacemaker, 1993 . GERD (gastroesophageal reflux disease)  . Atrial flutter  . Encounter for long-term (current) use of other medications    prior bleeding with coumadin, presently on asa . Chronic diastolic heart failure  . Coronary atherosclerosis of unspecified type of vessel, native or graft    asymptomatic . Anemia, unspecified  . Depression  . Retroperitoneal bleed 2005   LIFE THREATENING spontaneous retroperitoneal bleed on coumadin therapy 2005 . Respiratory failure 07/2011       Reviewed: Allergy & Precautions, NPO status , Patient's Chart, lab work & pertinent test results  Airway Mallampati: II  TM Distance: >3 FB Neck ROM: Full    Dental no notable dental hx.    Pulmonary COPD oxygen dependent,  breath sounds clear to auscultation  Pulmonary exam normal       Cardiovascular Exercise Tolerance: Poor hypertension, Pt. on medications and Pt. on home beta blockers + CAD and + Past MI + dysrhythmias Atrial Fibrillation + pacemaker Rhythm:Regular Rate:Normal  ECHO 07-12-14: reviewed: EF normal, severe  pulmonary htn, diastolic dysfunction.  Cardiology consult note reviewed.    Neuro/Psych PSYCHIATRIC DISORDERS Anxiety Depression negative neurological ROS     GI/Hepatic negative GI ROS, Neg liver ROS, GERD-  Medicated,  Endo/Other  Hypothyroidism   Renal/GU Renal InsufficiencyRenal disease  negative  genitourinary   Musculoskeletal  (+) Arthritis -,   Abdominal   Peds negative pediatric ROS (+)  Hematology  (+) anemia ,   Anesthesia Other Findings   Reproductive/Obstetrics negative OB ROS                       Anesthesia Physical Anesthesia Plan  ASA: IV  Anesthesia Plan: Spinal   Post-op Pain Management:    Induction: Intravenous  Airway Management Planned:   Additional Equipment: Arterial line  Intra-op Plan:   Post-operative Plan: Possible Post-op intubation/ventilation  Informed Consent: I have reviewed the patients History and Physical, chart, labs and discussed the procedure including the risks, benefits and alternatives for the proposed anesthesia with the patient or authorized representative who has indicated his/her understanding and acceptance.   Dental advisory given  Plan Discussed with: CRNA  Anesthesia Plan Comments: (Re-Discussed spinal and general with patient and patient's daughter. Patient at high risk due to diastolic heart failure and pulmonary hypertension. Patient has been cleared by cardiology and hospitalists but at high risk for cardiopulmonary complications. Spinal may be beneficial. No aortic stenosis. Chronic back pain. Plan arterial line. Neosynephrine drip available. Ketamine for spinal placement. Patient and patient's daughter understand situation.  High risk for complications given co-morbidities re explained to patient and daughter. They wish to procede.     )   Anesthesia Quick Evaluation

## 2014-07-13 NOTE — Progress Notes (Signed)
Key Points: Use following P&T approved IV to PO non-antibiotic change policy.  Description contains the criteria that are approved Note: Policy Excludes:  Esophagectomy patientsPHARMACIST - PHYSICIAN COMMUNICATION DR:   Izola PriceMyers CONCERNING: IV to Oral Route Change Policy  RECOMMENDATION: This patient is receiving protonix and synthroid by the intravenous route.  Based on criteria approved by the Pharmacy and Therapeutics Committee, the intravenous medication(s) is/are being converted to the equivalent oral dose form(s).   DESCRIPTION: These criteria include:  The patient is eating (either orally or via tube) and/or has been taking other orally administered medications for a least 24 hours  The patient has no evidence of active gastrointestinal bleeding or impaired GI absorption (gastrectomy, short bowel, patient on TNA or NPO).  If you have questions about this conversion, please contact the Pharmacy Department  []   [phone]( [phone] )  [facility] []   [phone]( [phone] )  [facility]  []   [phone]( [phone] )  [facility] [x]   [phone]( [phone] )  [facility]  Earl ManyLegge, Luisantonio Adinolfi CottonwoodMarshall, [facility] CampusRPH 07/13/2014 1:36 PM

## 2014-07-13 NOTE — Progress Notes (Signed)
Patient Name: [name]      SUBJECTIVE:  Admitted with hip fracture  comorbities incl AF perm with PM St Jude (DDD with atrial undersensing) COPD, HFpEF requiring O2 chronic diuretics w prn Metalozone, severe Pulm HTN (88mm -2014) Renal insufficiency grade 4  She has been appropriately identified as at high risk for surgery, Overnight she has been noted to be increasingly hypoxic requiring high O2 support with persistent ischemia  She has hx apparently of amio lung injury which may be contributing     Past Medical History  Diagnosis Date  . Essential hypertension     controlled  . Osteoporosis   . Persistent atrial fibrillation     probably permanent  . Vertigo   . Chronic renal insufficiency   . Hypothyroidism   . Arthritis   . Anxiety   . On home oxygen therapy     uses O2 @ 2l/m nasally bedtime  . Tachy-brady syndrome      treated with amiodarone and permanent DDD pacemaker, 1993  . GERD (gastroesophageal reflux disease)   . Atrial flutter   . Encounter for long-term (current) use of other medications     prior bleeding with coumadin, presently on asa  . Chronic diastolic heart failure   . Coronary atherosclerosis of unspecified type of vessel, native or graft     asymptomatic  . Anemia, unspecified   . Depression   . Retroperitoneal bleed 2005    LIFE THREATENING spontaneous retroperitoneal bleed on coumadin therapy 2005  . Respiratory failure 07/2011    Scheduled Meds:  Scheduled Meds: . atenolol  25 mg Oral BID  . buPROPion  300 mg Oral Daily  . docusate sodium  100 mg Oral BID  . furosemide  40 mg Intravenous Daily  . levothyroxine  50 mcg Intravenous Daily  . pantoprazole (PROTONIX) IV  40 mg Intravenous QHS   Continuous Infusions: . sodium chloride 10 mL/hr at 07/12/14 2000   HYDROcodone-acetaminophen, methocarbamol **OR** methocarbamol (ROBAXIN)  IV, morphine injection    PHYSICAL EXAM Filed Vitals:   07/12/14 2230 07/12/14  2258 07/13/14 0000 07/13/14 0400  BP: 128/59  99/60 115/55  Pulse:   80 80  Temp:   99.1 F (37.3 C) 98.6 F (37 C)  TempSrc:   Axillary Axillary  Resp:   12 11  Height:      Weight:    135 lb 5.8 oz (61.4 kg)  SpO2:  95% 95% 95%    Well developed and nourished in no acute distress HENT normal Neck supple with JVP->10 Crackles  Irregular rate and rhythm, RV lift Abd-soft with active BS No Clubbing cyanosis edema Skin-warm and dry A & Oriented  Grossly normal sensory and motor function   TELEMETRY: Reviewed telemetry pt in *afib with competitive v pacing    Intake/Output Summary (Last 24 hours) at 07/13/14 0754 Last data filed at 07/13/14 0600  Gross per 24 hour  Intake    580 ml  Output   1650 ml  Net  -1070 ml    LABS: Basic Metabolic Panel:  Recent Labs Lab 07/11/14 2333 07/12/14 0320 07/13/14 0405  NA 136 137 140  K 3.3* 3.2* 4.0  CL 102 101 107  CO2 GLUCOSE 130* 117* 109*  BUN 39* 39* 29*  CREATININE 2.11* 1.94* 1.65*  CALCIUM 9.3 8.7 8.8   Cardiac Enzymes:  Recent Labs  07/12/14 0320 07/12/14 0914 07/12/14 1508  TROPONINI 0.11*  0.12* 0.11*   CBC:  Recent Labs Lab 07/11/14 2333 07/12/14 0320 07/13/14 0405  WBC 13.6* 12.9* 11.6*  NEUTROABS 11.1* 9.5*  --   HGB 11.9* 11.3* 11.0*  HCT 37.2 36.1 36.2  MCV 91.9 91.6 93.5  PLT 241 237 201   PROTIME:  Recent Labs  07/11/14 2333  LABPROT 14.3  INR 1.10   Liver Function Tests:  Recent Labs  07/12/14 0320  AST 22  ALT 13  ALKPHOS 83  BILITOT 0.8  PROT 6.1  ALBUMIN 3.6   No results for input(s): LIPASE, AMYLASE in the last 72 hours. BNP: BNP (last 3 results)  Recent Labs  07/11/14 2328  BNP 299.6*    ProBNP (last 3 results)  Recent Labs  07/25/13 1215  PROBNP 4206.0*    D-Dimer: No results for input(s): DDIMER in the last 72 hours. Hemoglobin A1C: No results for input(s): HGBA1C in the last 72 hours. Fasting Lipid Panel: No results for input(s):  CHOL, HDL, LDLCALC, TRIG, CHOLHDL, LDLDIRECT in the last 72 hours. Thyroid Function Tests: No results for input(s): TSH, T4TOTAL, T3FREE, THYROIDAB in the last 72 hours.  Invalid input(s): FREET3 Anemia Panel: No results for input(s): VITAMINB12, FOLATE, FERRITIN, TIBC, IRON, RETICCTPCT in the last 72 hours.  Echo shows normal LV function   ASSESSMENT AND PLAN:  Active Problems:   Atrial fibrillation   Elevated troponin   Essential hypertension   Chronic diastolic heart failure   Chronic kidney disease, stage III (moderate)   Femur fracture, left   Closed left hip fracture   Hypokalemia   Pulmonary hypertension  Progressive hypoxemia over yesterday Was transferred to ICU and is even now in the OR   Have jsut spoken with anesthesia-- she will be returned to ICU for tuning of her respiratory status  She needs diuresis will begin lisix 80 bid Get CXR and consider CCM       Signed, Sherryl MangesSteven Taleen Prosser MD  07/13/2014  [phone]

## 2014-07-14 DIAGNOSIS — E876 Hypokalemia: Secondary | ICD-10-CM

## 2014-07-14 DIAGNOSIS — I10 Essential (primary) hypertension: Secondary | ICD-10-CM

## 2014-07-14 DIAGNOSIS — I270 Primary pulmonary hypertension: Secondary | ICD-10-CM

## 2014-07-14 DIAGNOSIS — J9621 Acute and chronic respiratory failure with hypoxia: Secondary | ICD-10-CM | POA: Diagnosis present

## 2014-07-14 DIAGNOSIS — I5033 Acute on chronic diastolic (congestive) heart failure: Secondary | ICD-10-CM | POA: Diagnosis present

## 2014-07-14 DIAGNOSIS — S72142A Displaced intertrochanteric fracture of left femur, initial encounter for closed fracture: Secondary | ICD-10-CM | POA: Insufficient documentation

## 2014-07-14 LAB — BASIC METABOLIC PANEL
Anion gap: 7 (ref 5–15)
BUN: 31 mg/dL — AB (ref 6–23)
CALCIUM: 8.4 mg/dL (ref 8.4–10.5)
CO2: 31 mmol/L (ref 19–32)
CREATININE: 1.77 mg/dL — AB (ref 0.50–1.10)
Chloride: 100 mmol/L (ref 96–112)
GFR calc Af Amer: 28 mL/min — ABNORMAL LOW (ref >90)
GFR, EST NON AFRICAN AMERICAN: 24 mL/min — AB (ref >90)
GLUCOSE: 114 mg/dL — AB (ref 70–99)
Potassium: 3.2 mmol/L — ABNORMAL LOW (ref 3.5–5.1)
Sodium: 138 mmol/L (ref 135–145)

## 2014-07-14 LAB — CBC
HEMATOCRIT: 36.5 % (ref 36.0–46.0)
Hemoglobin: 11 g/dL — ABNORMAL LOW (ref 12.0–15.0)
MCH: 28.4 pg (ref 26.0–34.0)
MCHC: 30.1 g/dL (ref 30.0–36.0)
MCV: 94.1 fL (ref 78.0–100.0)
PLATELETS: 197 10*3/uL (ref 150–400)
RBC: 3.88 MIL/uL (ref 3.87–5.11)
RDW: 17.1 % — AB (ref 11.5–15.5)
WBC: 15.3 10*3/uL — ABNORMAL HIGH (ref 4.0–10.5)

## 2014-07-14 LAB — VITAMIN D 25 HYDROXY (VIT D DEFICIENCY, FRACTURES): Vit D, 25-Hydroxy: 75.3 ng/mL (ref 30.0–100.0)

## 2014-07-14 LAB — TSH: TSH: 0.67 u[IU]/mL (ref 0.350–4.500)

## 2014-07-14 LAB — MAGNESIUM: MAGNESIUM: 2 mg/dL (ref 1.5–2.5)

## 2014-07-14 MED ORDER — LIP MEDEX EX OINT
TOPICAL_OINTMENT | CUTANEOUS | Status: DC | PRN
  Filled 2014-07-14: qty 7

## 2014-07-14 MED ORDER — POTASSIUM CHLORIDE CRYS ER 20 MEQ PO TBCR
60.0000 meq | EXTENDED_RELEASE_TABLET | Freq: Once | ORAL | Status: AC
  Administered 2014-07-14: 60 meq via ORAL
  Filled 2014-07-14: qty 3

## 2014-07-14 MED ORDER — LIP MEDEX EX OINT
TOPICAL_OINTMENT | CUTANEOUS | Status: AC
  Filled 2014-07-14: qty 7

## 2014-07-14 MED ORDER — CETYLPYRIDINIUM CHLORIDE 0.05 % MT LIQD
7.0000 mL | Freq: Two times a day (BID) | OROMUCOSAL | Status: DC
  Administered 2014-07-14 – 2014-07-21 (×11): 7 mL via OROMUCOSAL

## 2014-07-14 NOTE — Progress Notes (Signed)
C/o of L hip pain. Still on NRB.  LLE: shortened and externally rotated. 2+ DP. +TA/GS/EHL. SILT.  L peritroch femur fx Spoke with cardiology- pt not clear for surgery today due to respiratory status. Current current care

## 2014-07-14 NOTE — Plan of Care (Signed)
Problem: Phase I Progression Outcomes Goal: Pre op pain controlled with appropriate interventions Outcome: Progressing Pt does c/o pain when moving in bed.  Vicodin 2 tabs and Morphine Sulfate 2mg  IV given prn. Goal: Pre op NPO per MD orders Outcome: Progressing Pt not cleared to have surgery today, will evaluate in the am.  Pt to remain NPO after MN in case she does go tomorrow.  CXR ordered for am, pt receiving IV Lasix as ordered.

## 2014-07-14 NOTE — Progress Notes (Signed)
SUBJECTIVE: Pt has c/o mild SOB this am and left hip pain. O2 requirements increased overnight. Now on partial NRB mask and high supplemental O2.   BP 132/45 mmHg  Pulse 74  Temp(Src) 98 F (36.7 C) (Oral)  Resp 24  Ht 5\' 2"  (1.575 m)  Wt 138 lb 10.7 oz (62.9 kg)  BMI 25.36 kg/m2  SpO2 94%  Intake/Output Summary (Last 24 hours) at 07/14/14 0636 Last data filed at 07/14/14 0600  Gross per 24 hour  Intake   1030 ml  Output   2065 ml  Net  -1035 ml    PHYSICAL EXAM General: Well developed, well nourished, in no acute distress. Alert and oriented x 3.  Psych:  Good affect, responds appropriately Neck: No JVD. No masses noted.  Lungs: Clear bilaterally with no wheezes or rhonci noted.  Heart: Irreg with no murmurs noted. Abdomen: Bowel sounds are present. Soft, non-tender.  Extremities: No lower extremity edema.   LABS: Basic Metabolic Panel:  Recent Labs  16/02/9602/06/16 0405 07/14/14 0355  NA 140 138  K 4.0 3.2*  CL 107 100  CO2 28 31  GLUCOSE 109* 114*  BUN 29* 31*  CREATININE 1.65* 1.77*  CALCIUM 8.8 8.4   CBC:  Recent Labs  07/11/14 2333 07/12/14 0320 07/13/14 0405 07/14/14 0355  WBC 13.6* 12.9* 11.6* 15.3*  NEUTROABS 11.1* 9.5*  --   --   HGB 11.9* 11.3* 11.0* 11.0*  HCT 37.2 36.1 36.2 36.5  MCV 91.9 91.6 93.5 94.1  PLT 241 237 201 197   Cardiac Enzymes:  Recent Labs  07/12/14 0320 07/12/14 0914 07/12/14 1508  TROPONINI 0.11* 0.12* 0.11*   Current Meds: . atenolol  25 mg Oral BID  . buPROPion  300 mg Oral Daily  . docusate sodium  100 mg Oral BID  . furosemide  80 mg Intravenous BID  . levothyroxine  88 mcg Oral QAC breakfast  . pantoprazole  40 mg Oral QHS   Echo 07/12/14: Left ventricle: The cavity size was normal. Systolic function was normal. The estimated ejection fraction was in the range of 55% to 60%. Images were inadequate for LV wall motion assessment. The study was not technically sufficient to allow evaluation of  LV diastolic dysfunction due to atrial fibrillation. Mild to moderate concentric and severe focal basal septal hypertrophy. Doppler parameters are consistent with high ventricular filling pressure. - Ventricular septum: The contour showed diastolic flattening. These changes are consistent with RV volume overload. - Aortic valve: Mildly calcified annulus. Trileaflet; mildly thickened leaflets. - Mitral valve: Mildly thickened leaflets . - Left atrium: The atrium was severely dilated. Volume/bsa, ES, (1-plane Simpson&'s, A2C): 49.4 ml/m^2. - Right ventricle: Pacer wire or catheter noted in right ventricle. Systolic function was mildly reduced. - Right atrium: The atrium was moderately dilated. Pacer wire or catheter noted in right atrium. - Tricuspid valve: There was moderate regurgitation. - Pulmonary arteries: PA peak pressure: 57 mm Hg (S). Moderately elevated pulmonary pressures.  ASSESSMENT AND PLAN:  1. Hip fracture: Planning for hip repair per ortho when pulmonary status is optimized.   2. Acute on chronic diastolic CHF: She is on IV Lasix. Good diuresis last 24 hours. Renal function is slightly worsened from yesterday but baseline creatinine is around 2.1-2.3.  Would continue IV Lasix today and attempt to optimize pulmonary status before surgery. If her O2 requirements come down during the day then could possibly take her to the OR this afternoon but may need 24 more  hours diuresis. Repeat BMET in am.   3. Elevated troponin: Felt to be demand ischemia. No ischemic evaluation is planned.   4. Chronic atrial fibrillation: Not on long term anti-coagulation due to prior bleeding on coumadin. Rate is controlled on atenolol.   5. Hypokalemia: Replace this am.   6. Chronic respiratory failure: Due to COPD/Pulmonary HTN, Diastolic CHF. She wears supplemental O2 at home.   MCALHANY,CHRISTOPHER  3/7/20166:36 AM

## 2014-07-14 NOTE — Progress Notes (Signed)
[facility] PROGRESS NOTE  MICKAYLA TROUTEN ZOX:[id number] DOB: [birth year] DOA: 07/11/2014 PCP: Carollee Herter, MD  Brief narrative:    81 y.o. 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 Due to multiple co morbid conditions, cariology for consulted for pre op clearance prior to surgery.   Events since admission: 3/4 - admitted to telemetry unit, trop elevated 0.11 3/5 - persistently hypoxic with O2 sat in 80's on 6 L [place] and up to low 90's with venti mask, transferred to SDU       Assessment/Plan: #1 acute on chronic respiratory failure Likely multifactorial secondary to acute on chronic diastolic heart failure in the setting of atrial fibrillation, chronic COPD on home O2, pulmonary hypertension. Patient on Venturi mask. Chest x-ray yesterday consistent with volume overload. Patient is -1.02 L over the past 24 hours. Current weight at 138 pounds. Troponins were elevated however felt to be secondary to demand ischemia. 2-D echo with EF of 55%. Continue current IV Lasix. Continue atenolol. Monitor oxygen requirements. Cardiology following and appreciate input and recommendations.  #2 acute on chronic diastolic heart failure Patient with elevated troponins felt to be secondary to a demand ischemia. 2-D echo with EF of 55%. Patient with good diuresis and is -1.02 L over the past 24 hours. Current weight 138 pounds. Continue IV Lasix and beta blocker. Cardiology following and appreciate input and recommendations.  #3 left intertrochanteric hip fracture Patient is being seen by orthopedics. Awaiting improvement in pulmonary status  prior to surgery per cardiology.  #4 hypothyroidism Continue home dose Synthroid.  #5 atrial fibrillation Continue atenolol for rate control. Patient not deemed a long-term anticoagulation candidate secondary to prior bleeding on Coumadin. Per cardiology.  #6 hypokalemia Likely secondary to diuresis. Replete.  #7 elevated troponin Felt to be secondary to demand ischemia. Per cardiology no ischemic workup planned at this time.  #8 COPD Stable. Nebs as needed.  #9 hypertension Stable. On atenolol. Monitor on IV Lasix.  #10 chronic kidney disease stage IV Currently stable. Baseline creatinine is 2.1-2.3. Monitor closely with diuresis.  #11 severe protein calorie malnutrition Continue current diet. Follow.  #12 prophylaxis SCDs for DVT prophylaxis.  Code Status: Full Family Communication: Updated patient and daughter at bedside. Disposition Plan: Remain in the step down.   Consultants:  Orthopedics: Dr. supple 07/12/2014  Cardiology: Dr. Ace Gins 07/12/2014  Procedures:  Chest x-ray 07/11/2014, 07/13/2014  2-D echo 07/12/2014  X-ray of the left femur 07/12/2014  X-ray of the left hip with pelvis 07/11/2014  Antibiotics:  None  HPI/Subjective: Patient with complaints of shortness of breath. Patient with complaints of left hip pain.  Objective: Filed Vitals:   07/14/14 0925  BP: 103/85  Pulse: 88  Temp:   Resp:     Intake/Output Summary (Last 24 hours) at 07/14/14 1010 Last data filed at 07/14/14 0800  Gross per 24 hour  Intake   1030 ml  Output   1935 ml  Net   -905 ml   Filed Weights   07/12/14 2000 07/13/14 0400 07/14/14 0400  Weight: 61.4 kg (135 lb 5.8 oz) 61.4 kg (135 lb 5.8 oz) 62.9  kg (138 lb 10.7 oz)    Exam:   General:  On Venturi mask.  Cardiovascular: Irregularly irregular. Positive JVD.  Respiratory: Bibasilar crackles. No wheezing.  Abdomen: Soft, nontender, nondistended, positive bowel sounds.  Musculoskeletal: No clubbing  cyanosis or edema. Left lower extremity shortened and externally rotated.  Data Reviewed: Basic Metabolic Panel:  Recent Labs Lab 07/11/14 2333 07/12/14 0320 07/13/14 0405 07/14/14 0355  NA 136 137 140 138  K 3.3* 3.2* 4.0 3.2*  CL 102 101 107 100  CO2 26 29 28 31   GLUCOSE 130* 117* 109* 114*  BUN 39* 39* 29* 31*  CREATININE 2.11* 1.94* 1.65* 1.77*  CALCIUM 9.3 8.7 8.8 8.4  MG  --   --   --  2.0   Liver Function Tests:  Recent Labs Lab 07/12/14 0320  AST 22  ALT 13  ALKPHOS 83  BILITOT 0.8  PROT 6.1  ALBUMIN 3.6   No results for input(s): LIPASE, AMYLASE in the last 168 hours. No results for input(s): AMMONIA in the last 168 hours. CBC:  Recent Labs Lab 07/11/14 2333 07/12/14 0320 07/13/14 0405 07/14/14 0355  WBC 13.6* 12.9* 11.6* 15.3*  NEUTROABS 11.1* 9.5*  --   --   HGB 11.9* 11.3* 11.0* 11.0*  HCT 37.2 36.1 36.2 36.5  MCV 91.9 91.6 93.5 94.1  PLT 241 237 201 197   Cardiac Enzymes:  Recent Labs Lab 07/11/14 2328 07/12/14 0320 07/12/14 0914 07/12/14 1508  TROPONINI 0.11* 0.11* 0.12* 0.11*   BNP (last 3 results)  Recent Labs  07/11/14 2328  BNP 299.6*    ProBNP (last 3 results)  Recent Labs  07/25/13 1215  PROBNP 4206.0*    CBG: No results for input(s): GLUCAP in the last 168 hours.  Recent Results (from the past 240 hour(s))  Urine culture     Status: None   Collection Time: 07/12/14  2:25 AM  Result Value Ref Range Status   Specimen Description URINE, CATHETERIZED  Final   Special Requests Normal  Final   Colony Count NO GROWTH Performed at [facility]   Final   Culture NO GROWTH Performed at [facility]   Final   Report Status 07/13/2014 FINAL  Final  Surgical pcr screen     Status: None   Collection Time: 07/12/14 12:54 PM  Result Value Ref Range Status   MRSA, PCR NEGATIVE NEGATIVE Final   Staphylococcus aureus NEGATIVE NEGATIVE Final    Comment:        The Xpert SA Assay (FDA approved for NASAL  specimens in patients over 79 years of age), is one component of a comprehensive surveillance program.  Test performance has been validated by [facility] for patients greater than or equal to 79 year old. It is not intended to diagnose infection nor to guide or monitor treatment.      Studies: Dg Chest Port 1 View  07/13/2014   CLINICAL DATA:  Shortness of breath. Congestive heart failure. Weakness. Left hip fracture. Pre-op respiratory exam  EXAM: PORTABLE CHEST - 1 VIEW  COMPARISON:  07/11/2014  FINDINGS: Cardiomegaly stable. Increased diffuse interstitial infiltrates are seen, consistent with mild interstitial edema. No evidence of focal pulmonary consolidation or pleural effusion. Transvenous pacemaker remains in appropriate position.  IMPRESSION: Increased diffuse interstitial infiltrates, suspicious for interstitial edema. Stable cardiomegaly.   Electronically Signed   By: Myles RosenthalJohn  Stahl M.D.   On: 07/13/2014 10:32   Dg Femur Min 2 Views Left  07/12/2014   CLINICAL  DATA:  Fall yesterday with left hip injury and pain. Subsequent encounter.  EXAM: LEFT FEMUR 2 VIEWS  COMPARISON:  07/11/2014  FINDINGS: A comminuted intertrochanteric left femur fracture is noted with varus angulation.  There is no evidence of subluxation or dislocation.  No other fractures are identified.  The mid and distal femur are unremarkable.  No focal bony lesions are identified.  IMPRESSION: Comminuted intertrochanteric left femur fracture with varus angulation.   Electronically Signed   By: Harmon Pier M.D.   On: 07/12/2014 17:02    Scheduled Meds: . antiseptic oral rinse  7 mL Mouth Rinse BID  . atenolol  25 mg Oral BID  . buPROPion  300 mg Oral Daily  . docusate sodium  100 mg Oral BID  . furosemide  80 mg Intravenous BID  . levothyroxine  88 mcg Oral QAC breakfast  . pantoprazole  40 mg Oral QHS   Continuous Infusions: . sodium chloride 500 mL (07/13/14 0900)    Principal Problem:   Acute on chronic  respiratory failure with hypoxia Active Problems:   Closed left hip fracture   Acute on chronic diastolic heart failure   Atrial fibrillation   Elevated troponin   Essential hypertension   Chronic diastolic heart failure   Chronic kidney disease, stage III (moderate)   Femur fracture, left   Hypokalemia   Pulmonary hypertension    Time spent: 40 minutes    Collan Schoenfeld M.D. [facility] Pager [phone]. If 7PM-7AM, please contact night-coverage at [email or web address], password [facility] 07/14/2014, 10:10 AM  LOS: 2 days

## 2014-07-15 ENCOUNTER — Encounter (HOSPITAL_COMMUNITY)
Admission: EM | Disposition: A | Discharge status: Skilled Nursing Facility | Payer: MEDICARE | Source: Home / Self Care | Attending: Internal Medicine

## 2014-07-15 ENCOUNTER — Inpatient Hospital Stay (HOSPITAL_COMMUNITY): Payer: MEDICARE

## 2014-07-15 DIAGNOSIS — I5033 Acute on chronic diastolic (congestive) heart failure: Secondary | ICD-10-CM

## 2014-07-15 DIAGNOSIS — J9621 Acute and chronic respiratory failure with hypoxia: Secondary | ICD-10-CM

## 2014-07-15 DIAGNOSIS — S7292XA Unspecified fracture of left femur, initial encounter for closed fracture: Secondary | ICD-10-CM

## 2014-07-15 DIAGNOSIS — R0902 Hypoxemia: Secondary | ICD-10-CM

## 2014-07-15 DIAGNOSIS — M7989 Other specified soft tissue disorders: Secondary | ICD-10-CM

## 2014-07-15 LAB — CBC
HEMATOCRIT: 39.1 % (ref 36.0–46.0)
Hemoglobin: 11.7 g/dL — ABNORMAL LOW (ref 12.0–15.0)
MCH: 28.3 pg (ref 26.0–34.0)
MCHC: 29.9 g/dL — AB (ref 30.0–36.0)
MCV: 94.4 fL (ref 78.0–100.0)
PLATELETS: 240 10*3/uL (ref 150–400)
RBC: 4.14 MIL/uL (ref 3.87–5.11)
RDW: 17.3 % — AB (ref 11.5–15.5)
WBC: 18.8 10*3/uL — AB (ref 4.0–10.5)

## 2014-07-15 LAB — BASIC METABOLIC PANEL
ANION GAP: 9 (ref 5–15)
BUN: 37 mg/dL — ABNORMAL HIGH (ref 6–23)
CALCIUM: 8.9 mg/dL (ref 8.4–10.5)
CO2: 28 mmol/L (ref 19–32)
Chloride: 102 mmol/L (ref 96–112)
Creatinine, Ser: 1.7 mg/dL — ABNORMAL HIGH (ref 0.50–1.10)
GFR calc non Af Amer: 25 mL/min — ABNORMAL LOW (ref >90)
GFR, EST AFRICAN AMERICAN: 29 mL/min — AB (ref >90)
Glucose, Bld: 114 mg/dL — ABNORMAL HIGH (ref 70–99)
Potassium: 4.3 mmol/L (ref 3.5–5.1)
SODIUM: 139 mmol/L (ref 135–145)

## 2014-07-15 LAB — BLOOD GAS, ARTERIAL
ACID-BASE EXCESS: 2.6 mmol/L — AB (ref 0.0–2.0)
Bicarbonate: 26.9 mEq/L — ABNORMAL HIGH (ref 20.0–24.0)
Drawn by: 103701
O2 Content: 15 L/min
O2 SAT: 89.2 %
Patient temperature: 98.6
TCO2: 28.2 mmol/L (ref 0–100)
pCO2 arterial: 42.2 mmHg (ref 35.0–45.0)
pH, Arterial: 7.42 (ref 7.350–7.450)
pO2, Arterial: 58.7 mmHg — ABNORMAL LOW (ref 80.0–100.0)

## 2014-07-15 LAB — BRAIN NATRIURETIC PEPTIDE: B Natriuretic Peptide: 359.3 pg/mL — ABNORMAL HIGH (ref 0.0–100.0)

## 2014-07-15 SURGERY — INSERTION, INTRAMEDULLARY ROD, FEMUR
Anesthesia: Choice | Site: Hip | Laterality: Left

## 2014-07-15 MED ORDER — METOLAZONE 5 MG PO TABS
5.0000 mg | ORAL_TABLET | Freq: Every day | ORAL | Status: DC
  Administered 2014-07-15 – 2014-07-18 (×4): 5 mg via ORAL
  Filled 2014-07-15 (×6): qty 1

## 2014-07-15 NOTE — Consult Note (Addendum)
PULMONARY / CRITICAL CARE MEDICINE   Name: [name] MRN: [id number] DOB: 1919/08/01    ADMISSION DATE:  07/11/2014 CONSULTATION DATE:  07/15/14  REFERRING MD :  Dr. Janee Morn [facility]  CHIEF COMPLAINT:  Shortness of breath, left hip fracture  INITIAL PRESENTATION: 81 year old female admitted on 07/12/2014 for a left hip fracture, pulmonary and crit of care medicine consultation for ongoing hypoxemic respiratory failure on 07/15/2014.  STUDIES:  07/12/2014 transthoracic echocardiogram> LVEF 55-60%, severe basal hypertrophy and moderate concentric hypertrophy, Doppler parameters consistent with high v entricular filling pressure, ventricular septum is consistent with RV volume overload, RVSP 55 mmHg, right atrium is moderately dilated, pacer wire noted, right ventricle systolic function mildly reduced.  SIGNIFICANT EVENTS:   HISTORY OF PRESENT ILLNESS:  This is a pleasant 81 year old female with a past medical history significant for diastolic heart failure who has been followed by Dr. Katrinka Blazing for many years who was admitted on 07/12/2014 after a fall at home caused a left hip fracture. She has been followed by the [facility], orthopedics and cardiology. During this hospital stay her oxygenation has been limited and she has been treated with IV diuresis. She has diuresed as much as 2 and half liters but yet she still remains hypoxemic with an abnormal chest x-ray. Pulmonary and critical care medicine was consulted for the same.  She has a remote history of amiodarone-induced lung toxicity which her family says was over 5 years ago. She has not been on amiodarone since then. At home, she uses 3 L of oxygen chronically and her caregiver who is with her here today states that it is not uncommon for her to drop into the 70s while ambulating. Typically, she will develop ankle swelling followed by worsening dyspnea and hypoxemia. She says that this improves on days  when they use metolazone in addition to her home dose of Lasix. This will lead to a very good diuretic response and then improvement in the ankle swelling, dyspnea, and oxygenation. On the days when her ankle swelling is worse she typically does have a bit more of a cough, but typically this resolves after using diuretic medication. She does not have a chronic cough.  During this hospital stay she has been noted to have a leukocytosis and an abnormal chest x-ray. She has not been febrile. She has not been coughing significantly during the hospital visit. Her mobility has been severely limited by left hip pain and she has been bedbound for the duration of the hospital visit. She has been treated with IV diuretics aggressively during this hospital stay.  She denies pain of any kind other than the hip pain.   PAST MEDICAL HISTORY :   has a past medical history of Essential hypertension; Osteoporosis; Persistent atrial fibrillation; Vertigo; Chronic renal insufficiency; Hypothyroidism; Arthritis; Anxiety; On home oxygen therapy; Tachy-brady syndrome; GERD (gastroesophageal reflux disease); Atrial flutter; Encounter for long-term (current) use of other medications; Chronic diastolic heart failure; Coronary atherosclerosis of unspecified type of vessel, native or graft; Anemia, unspecified; Depression; Retroperitoneal bleed (2005); and Respiratory failure (07/2011).  has past surgical history that includes Knee arthroscopy (Right); Total hip arthroplasty (Right); Esophagogastroduodenoscopy (N/A, 11/26/2012); Joint replacement; Shoulder surgery; Esophagogastroduodenoscopy (N/A, 03/08/2013); Pacemaker insertion; Cholecystectomy (10/08/1997); and Pacemaker generator change (6/10/20087). Prior to Admission medications   Medication Sig Start Date End Date Taking? Authorizing Provider  acetaminophen (TYLENOL ARTHRITIS PAIN) 650 MG CR tablet Take 650 mg by mouth 2 (two) times daily.    Yes Historical Provider,  MD   amLODipine (NORVASC) 5 MG tablet Take 1 tablet by mouth once a day 07/01/14  Yes Lyn RecordsHenry W Smith, MD  aspirin EC 81 MG tablet Take 81 mg by mouth daily.   Yes Historical Provider, MD  atenolol (TENORMIN) 25 MG tablet Take 1 tablet by mouth two  times daily 07/01/14  Yes Lyn RecordsHenry W Smith, MD  buPROPion (WELLBUTRIN XL) 300 MG 24 hr tablet Take 300 mg by mouth daily.   Yes Historical Provider, MD  Cholecalciferol (VITAMIN D3) 2000 UNITS TABS Take 2,000 Units by mouth daily.   Yes Historical Provider, MD  estradiol (ESTRACE) 0.1 MG/GM vaginal cream Place 2 g vaginally at bedtime.    Yes Historical Provider, MD  furosemide (LASIX) 80 MG tablet Take 1.5 tablets (120 mg  total) by mouth daily. Patient taking differently: 80mg  once a day 07/01/14  Yes Lyn RecordsHenry W Smith, MD  levothyroxine (SYNTHROID, LEVOTHROID) 88 MCG tablet Take 1 tablet by mouth  daily before breakfast 06/30/14  Yes Ronnald NianJohn C Lalonde, MD  metolazone (ZAROXOLYN) 2.5 MG tablet Take 1 tablet (2.5 mg total) by mouth once a week. Take 30 minutes before Lasix on Mondays 07/03/14  Yes Lyn RecordsHenry W Smith, MD  Multiple Vitamins-Minerals (ICAPS PO) Take 1 capsule by mouth daily.   Yes Historical Provider, MD  NON FORMULARY Take 1 tablet by mouth daily. Ultra Flora IB once a day   Yes Historical Provider, MD  omeprazole (PRILOSEC) 20 MG capsule Take 40 mg by mouth daily.    Yes Historical Provider, MD  potassium chloride (K-DUR) 10 MEQ tablet Take 1 tablet by mouth  daily 07/01/14  Yes Lyn RecordsHenry W Smith, MD  potassium chloride (K-DUR,KLOR-CON) 10 MEQ tablet Take 1 tablet (10 mEq total) by mouth daily. 12/18/13  Yes Lyn RecordsHenry W Smith, MD  traMADol (ULTRAM) 50 MG tablet Take 50 mg by mouth daily as needed (pain).   Yes Historical Provider, MD  trimethoprim (TRIMPEX) 100 MG tablet Take 100 mg by mouth daily.   Yes Historical Provider, MD  hyoscyamine (LEVSIN SL) 0.125 MG SL tablet PLACE 1 TABLET (0.125 MG TOTAL) UNDER THE TONGUE EVERY 4 (FOUR) HOURS AS NEEDED FOR CRAMPING. 09/26/13    Ronnald NianJohn C Lalonde, MD  loperamide (IMODIUM A-D) 2 MG tablet Take 2-4 mg by mouth as needed for diarrhea or loose stools. Take 2 tablets at first loose stool, then take 1 tablet as needed for more diarrhea    Historical Provider, MD  mupirocin ointment (BACTROBAN) 2 %  12/27/13   Historical Provider, MD  ondansetron (ZOFRAN) 4 MG tablet Take 4 mg by mouth every 6 (six) hours as needed for nausea or vomiting.    Historical Provider, MD   Allergies  Allergen Reactions  . Quinidine Other (See Comments)    Skin peels  . Tiazac [Diltiazem Hcl] Hives    "don't remember how bad the reaction was" (04/24/2012)  . Warfarin Sodium Other (See Comments)    Life-threatening retroperitoneal bleed in 2005  . Percocet [Oxycodone-Acetaminophen] Other (See Comments)    confusion  . Promethazine Hcl Other (See Comments)    confusion  . Amiodarone Other (See Comments)    Lung problem per daughter  . Procainamide Other (See Comments)    Lupus reaction  . Warfarin And Related Other (See Comments)    Abdominal bleed  . Amoxicillin Rash  . Ciprofloxacin Rash  . Polocaine [Mepivacaine Hcl] Palpitations and Rash  . Septra [Sulfamethoxazole W/Trimethoprim (Co-Trimoxazole)] Rash and Other (See Comments)    Dizziness  and tremor "don't remember how bad the reaction was" (04/24/2012)    FAMILY HISTORY:  indicated that her mother is deceased. She indicated that her father is deceased.  SOCIAL HISTORY:  reports that she has never smoked. She has never used smokeless tobacco. She reports that she does not drink alcohol or use illicit drugs.  REVIEW OF SYSTEMS:   Gen: Denies fever, chills, weight change, fatigue, night sweats HEENT: Denies blurred vision, double vision, hearing loss, tinnitus, sinus congestion, rhinorrhea, sore throat, neck stiffness, dysphagia PULM: per HPI CV: Denies chest pain, edema, orthopnea, paroxysmal nocturnal dyspnea, palpitations GI: Denies abdominal pain, nausea, vomiting, diarrhea,  hematochezia, melena, constipation, change in bowel habits GU: Denies dysuria, hematuria, polyuria, oliguria, urethral discharge Endocrine: Denies hot or cold intolerance, polyuria, polyphagia or appetite change Derm: Denies rash, dry skin, scaling or peeling skin change MSK: per HPI Heme: Denies easy bruising, bleeding, bleeding gums Neuro: Denies headache, numbness, weakness, slurred speech, loss of memory or consciousness   SUBJECTIVE:   VITAL SIGNS: Temp:  [97.1 F (36.2 C)-99 F (37.2 C)] 97.1 F (36.2 C) (03/08 0800) Pulse Rate:  [75-168] 83 (03/08 0400) Resp:  [11-36] 36 (03/08 0400) BP: (97-138)/(34-78) 97/74 mmHg (03/08 0400) SpO2:  [82 %-96 %] 96 % (03/08 0402) FiO2 (%):  [55 %] 55 % (03/07 1454) Weight:  [61.8 kg (136 lb 3.9 oz)] 61.8 kg (136 lb 3.9 oz) (03/08 0400) HEMODYNAMICS:   VENTILATOR SETTINGS: Vent Mode:  [-]  FiO2 (%):  [55 %] 55 % INTAKE / OUTPUT:  Intake/Output Summary (Last 24 hours) at 07/15/14 1000 Last data filed at 07/15/14 0600  Gross per 24 hour  Intake    450 ml  Output    810 ml  Net   -360 ml    PHYSICAL EXAMINATION: General:  Chronically ill appearing on a non-rebreather Neuro:  NCAT, MAEW, occasionally tearful but oriented and provides a good history HEENT:  NCAT, EOMi Cardiovascular:  Irreg irreg, no clear murmur,  Lungs:  scattered crackles, no wheezing Abdomen:  BS+, soft, nontender Musculoskeletal:  Mild muscle atrophy consistent with her advanced age Skin:  Thin, bruising noted, no rash or breakdown Ext: warm, acyanotic, no ankle swelling  LABS:  CBC  Recent Labs Lab 07/13/14 0405 07/14/14 0355 07/15/14 0413  WBC 11.6* 15.3* 18.8*  HGB 11.0* 11.0* 11.7*  HCT 36.2 36.5 39.1  PLT 201 197 240   Coag's  Recent Labs Lab 07/11/14 2333  INR 1.10   BMET  Recent Labs Lab 07/13/14 0405 07/14/14 0355 07/15/14 0413  NA 140 138 139  K 4.0 3.2* 4.3  CL 107 100 102  CO2 BUN 29* 31* 37*  CREATININE  1.65* 1.77* 1.70*  GLUCOSE 109* 114* 114*   Electrolytes  Recent Labs Lab 07/13/14 0405 07/14/14 0355 07/15/14 0413  CALCIUM 8.8 8.4 8.9  MG  --  2.0  --    Sepsis Markers No results for input(s): LATICACIDVEN, PROCALCITON, O2SATVEN in the last 168 hours. ABG No results for input(s): PHART, PCO2ART, PO2ART in the last 168 hours. Liver Enzymes  Recent Labs Lab 07/12/14 0320  AST 22  ALT 13  ALKPHOS 83  BILITOT 0.8  ALBUMIN 3.6   Cardiac Enzymes  Recent Labs Lab 07/12/14 0320 07/12/14 0914 07/12/14 1508  TROPONINI 0.11* 0.12* 0.11*   Glucose No results for input(s): GLUCAP in the last 168 hours.  Imaging  07/15/2014 chest x-ray> diffuse hazy opacities bilaterally with perhaps a more focal right upper  lobe area of airspace disease, likely bilateral pleural effusions November 2014 CT chest images reviewed> bilateral pleural effusions are mild to moderate in size, very mild interstitial changes in the periphery of the lung worse in the bases likely consistent with volume overload, no clear interstitial lung disease.  ASSESSMENT / PLAN:  Principal Problem:   Acute on chronic respiratory failure with hypoxia Active Problems:   Atrial fibrillation   Elevated troponin   Essential hypertension   Chronic diastolic heart failure   Chronic kidney disease, stage III (moderate)   Femur fracture, left   Closed left hip fracture   Hypokalemia   Pulmonary hypertension   Acute on chronic diastolic heart failure   Intertrochanteric fracture of left femur  Discussion> I have been asked to evaluate Miss Landstrom for acute hypoxemic respiratory failure. Chest imaging has been reviewed from this hospital stay as well as prior chest x-rays and a CT chest from about 3 years ago. She has a remote history of amiodarone-induced lung toxicity but on the CT chest from November 2014 I see very little evidence of interstitial lung disease.  Her family gives a very good history and it  sounds like the majority of her hypoxemia in the last several years has been associated with volume overload. According to records it sounds as if her Lasix had been adjusted prior to this hospital visit.  Objectively she is not in any respiratory distress and she is not coughing, she does have some fine crackles throughout her lungs. She does not have ankle edema but she has been supine for several days.  The differential diagnosis for her hypoxemic respiratory failure is acute pulmonary edema or a pulmonary embolism (though she has no history of chest pain), or less likely healthcare associated pneumonia (she has no cough or fever), or even less likely a new interstitial lung disease process.  I agree that she does not appear overtly volume overloaded at this point and her hypoxemic seems disproportionate to her exam and imaging findings.  However given her lengthy history of acute hypoxemia induced by heart failure I would favor continued diuresis prior to moving her to the CT scanner as I see little evidence for an alternate pulmonary process right now.  Recommendation: I think the best approach at this point is to continue diuresis for another 24 hours with the addition of metolazone as her family reports that this works well at home. If we do not see improvement in her hypoxemia by tomorrow morning then I would recommend a CT scan of the chest. Lower extremity doppler ultrasound now Would hold antibiotics at this time I would not go to the OR until we have seen improvement in her oxygenation   FAMILY  - Updates: daughters updated bedside and by phone 3/8    Heber Lochsloy, MD [facility] Pager: [phone] Cell: [phone] If no response, call [phone]  07/15/2014, 10:00 AM

## 2014-07-15 NOTE — Progress Notes (Signed)
[facility] PROGRESS NOTE  LAKARA WEILAND ION:[id number] DOB: 1919-12-24 DOA: 07/11/2014 PCP: Carollee Herter, MD  Brief narrative:    81 y.o. female with HTN, atrial fibrillation but not on AC due to bleeding (was on Coumadin in 2005 but that was stopped), CKD stage IV, diastolic CHF, on oxygen at home, per family member also unknown lung injury from Amiodarone (pt was on for 8 years), PM [employer] (DDD with atrial undersensing) COPD, severe pulm HTN, presented to [facility] ED after an episode of fall and has subsequently sustained left hip fracture. Due to multiple co morbid conditions, cariology for consulted for pre op clearance prior to surgery.   Events since admission: 3/4 - admitted to telemetry unit, trop elevated 0.11 3/5 - persistently hypoxic with O2 sat in 80's on 6 L [place] and up to low 90's with venti mask, transferred to SDU       Assessment/Plan: #1 acute on chronic respiratory failure Likely multifactorial secondary to acute on chronic diastolic heart failure in the setting of atrial fibrillation, chronic COPD on home O2, pulmonary hypertension. Patient on partial NRB mask, and desats into 80s on venturi mask. Chest x-ray today consistent with volume overload. Patient is -2.35L since admission 07/12/14. Current weight at 136 pounds from 138 pounds. Troponins were elevated however felt to be secondary to demand ischemia. 2-D echo with EF of 55%. Continue current IV Lasix. Continue atenolol. Zaroxyln to be added per PCCM. Monitor oxygen requirements. If no improvement in next 24 hours will get CT chest per pulmonary rxcs. Cardiology following and appreciate input and recommendations. PCCM ff and appreciate input and rxcs.   #2 acute on chronic diastolic heart failure Patient with elevated troponins felt to be secondary to a demand ischemia. 2-D echo with EF of 55%. Patient with good diuresis and is -2.35 L since admission 07/12/14. Current weight 136 LBs from 138 pounds. Continue IV  Lasix and beta blocker. Zaroxyln to be added per pulmonary. Cardiology following and appreciate input and recommendations.  #3 left intertrochanteric hip fracture Patient is being seen by orthopedics. Awaiting improvement in pulmonary status prior to surgery per cardiology.  #4 hypothyroidism Continue home dose Synthroid.  #5 atrial fibrillation Continue atenolol for rate control. Patient not deemed a long-term anticoagulation candidate secondary to prior bleeding on Coumadin. Per cardiology.  #6 hypokalemia Likely secondary to diuresis. Repleted.  #7 elevated troponin Felt to be secondary to demand ischemia. Per cardiology no ischemic workup planned at this time.  #8 COPD Stable. Nebs as needed.  #9 hypertension Stable. On atenolol. Monitor on IV Lasix.  #10 chronic kidney disease stage IV Currently stable. Baseline creatinine is 2.1-2.3. Monitor closely with diuresis.  #11 severe protein calorie malnutrition Continue current diet. Follow.  #12 prophylaxis SCDs for DVT prophylaxis.  Code Status: Full Family Communication: Updated patient and daughter at bedside. Disposition Plan: Remain in the step down.   Consultants:  Orthopedics: Dr. supple 07/12/2014  Cardiology: Dr. Ace Gins 07/12/2014  PCCM Dr Kendrick Fries 07/15/14  Procedures:  Chest x-ray 07/11/2014, 07/13/2014  2-D echo 07/12/2014  X-ray of the left femur 07/12/2014  X-ray of the left hip with pelvis 07/11/2014  Antibiotics:  None  HPI/Subjective: Patient with complaints of shortness of breath, when off partial NRB mask. Patient with complaints of left hip pain.  Objective: Filed Vitals:   07/15/14 0400  BP: 97/74  Pulse: 83  Temp: 98.2 F (36.8 C)  Resp: 36    Intake/Output Summary (Last 24 hours) at 07/15/14 0910  Last data filed at 07/15/14 0600  Gross per 24 hour  Intake    460 ml  Output   1040 ml  Net   -580 ml   Filed Weights   07/13/14 0400 07/14/14 0400 07/15/14 0400  Weight:  61.4 kg (135 lb 5.8 oz) 62.9 kg (138 lb 10.7 oz) 61.8 kg (136 lb 3.9 oz)    Exam:   General:  On partial Non rebreather mask.  Cardiovascular: Irregularly irregular. Positive JVD.  Respiratory: Bibasilar crackles. No wheezing.  Abdomen: Soft, nontender, nondistended, positive bowel sounds.  Musculoskeletal: No clubbing cyanosis or edema. Left lower extremity shortened and externally rotated.  Data Reviewed: Basic Metabolic Panel:  Recent Labs Lab 07/11/14 2333 07/12/14 0320 07/13/14 0405 07/14/14 0355 07/15/14 0413  NA 136 137 140 138 139  K 3.3* 3.2* 4.0 3.2* 4.3  CL 102 101 107 100 102  CO2 GLUCOSE 130* 117* 109* 114* 114*  BUN 39* 39* 29* 31* 37*  CREATININE 2.11* 1.94* 1.65* 1.77* 1.70*  CALCIUM 9.3 8.7 8.8 8.4 8.9  MG  --   --   --  2.0  --    Liver Function Tests:  Recent Labs Lab 07/12/14 0320  AST 22  ALT 13  ALKPHOS 83  BILITOT 0.8  PROT 6.1  ALBUMIN 3.6   No results for input(s): LIPASE, AMYLASE in the last 168 hours. No results for input(s): AMMONIA in the last 168 hours. CBC:  Recent Labs Lab 07/11/14 2333 07/12/14 0320 07/13/14 0405 07/14/14 0355 07/15/14 0413  WBC 13.6* 12.9* 11.6* 15.3* 18.8*  NEUTROABS 11.1* 9.5*  --   --   --   HGB 11.9* 11.3* 11.0* 11.0* 11.7*  HCT 37.2 36.1 36.2 36.5 39.1  MCV 91.9 91.6 93.5 94.1 94.4  PLT 241 237 201 197 240   Cardiac Enzymes:  Recent Labs Lab 07/11/14 2328 07/12/14 0320 07/12/14 0914 07/12/14 1508  TROPONINI 0.11* 0.11* 0.12* 0.11*   BNP (last 3 results)  Recent Labs  07/11/14 2328 07/15/14 0413  BNP 299.6* 359.3*    ProBNP (last 3 results)  Recent Labs  07/25/13 1215  PROBNP 4206.0*    CBG: No results for input(s): GLUCAP in the last 168 hours.  Recent Results (from the past 240 hour(s))  Urine culture     Status: None   Collection Time: 07/12/14  2:25 AM  Result Value Ref Range Status   Specimen Description URINE, CATHETERIZED  Final   Special  Requests Normal  Final   Colony Count NO GROWTH Performed at [facility]   Final   Culture NO GROWTH Performed at [facility]   Final   Report Status 07/13/2014 FINAL  Final  Surgical pcr screen     Status: None   Collection Time: 07/12/14 12:54 PM  Result Value Ref Range Status   MRSA, PCR NEGATIVE NEGATIVE Final   Staphylococcus aureus NEGATIVE NEGATIVE Final    Comment:        The Xpert SA Assay (FDA approved for NASAL specimens in patients over 35 years of age), is one component of a comprehensive surveillance program.  Test performance has been validated by [facility] for patients greater than or equal to 78 year old. It is not intended to diagnose infection nor to guide or monitor treatment.      Studies: Dg Chest Port 1 View  07/15/2014   CLINICAL DATA:  Shortness of breath.  EXAM: PORTABLE CHEST - 1 VIEW  COMPARISON:  07/13/2014.  FINDINGS: Mediastinum and hilar structures are normal. Cardiac pacer stable position. Stable cardiomegaly. Stable diffuse from interstitial prominence. Congestive heart failure could present in this fashion. Pneumonitis cannot be excluded. No pleural effusion or pneumothorax.  IMPRESSION: Persistent cardiomegaly with bilateral pulmonary interstitial prominence suggesting congestive heart failure. Pneumonitis cannot be excluded.   Electronically Signed   By: Maisie Fushomas  Register   On: 07/15/2014 07:19   Dg Chest Port 1 View  07/13/2014   CLINICAL DATA:  Shortness of breath. Congestive heart failure. Weakness. Left hip fracture. Pre-op respiratory exam  EXAM: PORTABLE CHEST - 1 VIEW  COMPARISON:  07/11/2014  FINDINGS: Cardiomegaly stable. Increased diffuse interstitial infiltrates are seen, consistent with mild interstitial edema. No evidence of focal pulmonary consolidation or pleural effusion. Transvenous pacemaker remains in appropriate position.  IMPRESSION: Increased diffuse interstitial infiltrates, suspicious for interstitial  edema. Stable cardiomegaly.   Electronically Signed   By: Myles RosenthalJohn  Stahl M.D.   On: 07/13/2014 10:32    Scheduled Meds: . antiseptic oral rinse  7 mL Mouth Rinse BID  . atenolol  25 mg Oral BID  . buPROPion  300 mg Oral Daily  . docusate sodium  100 mg Oral BID  . furosemide  80 mg Intravenous BID  . levothyroxine  88 mcg Oral QAC breakfast  . pantoprazole  40 mg Oral QHS   Continuous Infusions: . sodium chloride 500 mL (07/13/14 0900)    Principal Problem:   Acute on chronic respiratory failure with hypoxia Active Problems:   Closed left hip fracture   Acute on chronic diastolic heart failure   Atrial fibrillation   Elevated troponin   Essential hypertension   Chronic diastolic heart failure   Chronic kidney disease, stage III (moderate)   Femur fracture, left   Hypokalemia   Pulmonary hypertension   Intertrochanteric fracture of left femur    Time spent: 40 minutes    Cali Hope M.D. [facility] Pager [phone]. If 7PM-7AM, please contact night-coverage at [email or web address], password [email or web address] 07/15/2014, 9:10 AM  LOS: 3 days

## 2014-07-15 NOTE — Progress Notes (Signed)
SUBJECTIVE: No chest pain. Breathing seems to be slightly better.   BP 97/74 mmHg  Pulse 83  Temp(Src) 98.2 F (36.8 C) (Oral)  Resp 36  Ht  (1.575 m)  Wt 136 lb 3.9 oz (61.8 kg)  BMI 24.91 kg/m2  SpO2 96%  Intake/Output Summary (Last 24 hours) at 07/15/14 1610 Last data filed at 07/15/14 0600  Gross per 24 hour  Intake    610 ml  Output   1090 ml  Net   -480 ml    PHYSICAL EXAM General: Eldery female, in no acute distress. Alert and oriented x 3. Wearing NRB mask.  Psych:  Good affect, responds appropriately Neck: + JVD. No masses noted.  Lungs: Rhonci left lung with crackles in both bases. No wheezes  Heart: Irreg irreg with no murmurs noted. Abdomen: Bowel sounds are present. Soft, non-tender.  Extremities: No lower extremity edema.   LABS: Basic Metabolic Panel:  Recent Labs  96/04/54 0355 07/15/14 0413  NA 138 139  K 3.2* 4.3  CL 100 102  CO2 31 28  GLUCOSE 114* 114*  BUN 31* 37*  CREATININE 1.77* 1.70*  CALCIUM 8.4 8.9  MG 2.0  --    CBC:  Recent Labs  07/14/14 0355 07/15/14 0413  WBC 15.3* 18.8*  HGB 11.0* 11.7*  HCT 36.5 39.1  MCV 94.1 94.4  PLT 197 240   Current Meds: . antiseptic oral rinse  7 mL Mouth Rinse BID  . atenolol  25 mg Oral BID  . buPROPion  300 mg Oral Daily  . docusate sodium  100 mg Oral BID  . furosemide  80 mg Intravenous BID  . levothyroxine  88 mcg Oral QAC breakfast  . pantoprazole  40 mg Oral QHS   Echo 07/12/14: Left ventricle: The cavity size was normal. Systolic function was normal. The estimated ejection fraction was in the range of 55% to 60%. Images were inadequate for LV wall motion assessment. The study was not technically sufficient to allow evaluation of LV diastolic dysfunction due to atrial fibrillation. Mild to moderate concentric and severe focal basal septal hypertrophy. Doppler parameters are consistent with high ventricular filling pressure. - Ventricular septum: The  contour showed diastolic flattening. These changes are consistent with RV volume overload. - Aortic valve: Mildly calcified annulus. Trileaflet; mildly thickened leaflets. - Mitral valve: Mildly thickened leaflets . - Left atrium: The atrium was severely dilated. Volume/bsa, ES, (1-plane Simpson&'s, A2C): 49.4 ml/m^2. - Right ventricle: Pacer wire or catheter noted in right ventricle. Systolic function was mildly reduced. - Right atrium: The atrium was moderately dilated. Pacer wire or catheter noted in right atrium. - Tricuspid valve: There was moderate regurgitation. - Pulmonary arteries: PA peak pressure: 57 mm Hg (S). Moderately elevated pulmonary pressures.  ASSESSMENT AND PLAN:  1. Hip fracture: Planning for hip repair per ortho when pulmonary status is optimized.   2. Acute on chronic diastolic CHF: She is on IV Lasix and has had good diuresis last 48 hours. Renal function is stable. She is at her baseline weight but continues to require high levels of supplemental O2 with partial NRB mask. Chest x-ray this am by my review seems to show no resolution of her diffuse interstitial edema. WBC is elevated and rising. I do not think she would do well today with intubation for the surgical procedure and may not be easily extubated. I would continue IV Lasix today and hold off on surgery. I would get Pulmonary involved to  comment on her lung disease and see if there is anything they would add to optimize things for surgery later this week.   3. Elevated troponin: Felt to be demand ischemia. No ischemic evaluation is planned.   4. Chronic atrial fibrillation: Not on long term anti-coagulation due to prior bleeding on coumadin. Rate is controlled on atenolol.   5. Chronic respiratory failure: Due to amiodarone lung injury/? COPD/Pulmonary HTN, Diastolic CHF. She wears supplemental O2 at home but is clearly not at her baseline and not improving significantly with diuresis.        [name]  3/8/20166:32 AM

## 2014-07-15 NOTE — Care Management Note (Signed)
CARE MANAGEMENT NOTE 07/15/2014  Patient:  [name],[name]   Account Number:  [id number]  Date Initiated:  07/15/2014  Documentation initiated by:  Ronan Duecker  Subjective/Objective Assessment:   81 year old female admitted on 07/12/2014 for a left hip fracture, pulmonary and crit of care medicine consultation for ongoing hypoxemic respiratory failure on 07/15/2014.     Action/Plan:   snf when stable   Anticipated DC Date:  07/18/2014   Anticipated DC Plan:  SKILLED NURSING FACILITY  In-house referral  Clinical Social Worker      DC Planning Services  CM consult      [facility]   Choice offered to / List presented to:  NA   DME arranged  NA      DME agency  NA     HH arranged  NA      HH agency  NA   Status of service:  In process, will continue to follow Medicare Important Message given?   (If response is "NO", the following Medicare IM given date fields will be blank) Date Medicare IM given:   Medicare IM given by:   Date Additional Medicare IM given:   Additional Medicare IM given by:    Discharge Disposition:    Per UR Regulation:  Reviewed for med. necessity/level of care/duration of stay  If discussed at Long Length of Stay Meetings, dates discussed:    Comments:  July 15, 2014/Stanisha Lorenz L. Earlene Plateravis, RN, BSN, CCM. Case Management [facility] [phone] No discharge needs present of time of review.

## 2014-07-15 NOTE — Progress Notes (Addendum)
CSW continuing to follow.   Per unit progression meeting, pt has not yet been cleared by cardiology for surgery and remains on partial non-rebreather mask for oxygen.   Pt will need rehab at SNF following hospitalization and CSW will continue to follow to facilitate pt disposition needs when pt closer to being medically stable. Per report, pt family interested in [facility].   CSW to continue to follow to assist with disposition needs as appropriate.  [name], MSW, LCSW Clinical Social Work [phone]

## 2014-07-15 NOTE — Progress Notes (Addendum)
*  Preliminary Results* Bilateral lower extremity venous duplex completed. Bilateral lower extremities are negative for deep vein thrombosis. Spectral doppler of the lower extremity veins exhibit pulsatile flow, suggestive of elevated right sided heart pressure. There is no evidence of Baker's cyst bilaterally.  07/15/2014  Gertie FeyMichelle Irmgard Rampersaud, RVT, RDCS, RDMS

## 2014-07-16 ENCOUNTER — Inpatient Hospital Stay (HOSPITAL_COMMUNITY): Payer: MEDICARE

## 2014-07-16 DIAGNOSIS — S72002S Fracture of unspecified part of neck of left femur, sequela: Secondary | ICD-10-CM

## 2014-07-16 DIAGNOSIS — I4891 Unspecified atrial fibrillation: Secondary | ICD-10-CM

## 2014-07-16 DIAGNOSIS — R0600 Dyspnea, unspecified: Secondary | ICD-10-CM | POA: Insufficient documentation

## 2014-07-16 LAB — CBC WITH DIFFERENTIAL/PLATELET
BASOS PCT: 0 % (ref 0–1)
Basophils Absolute: 0 10*3/uL (ref 0.0–0.1)
EOS ABS: 0.2 10*3/uL (ref 0.0–0.7)
Eosinophils Relative: 2 % (ref 0–5)
HEMATOCRIT: 36.9 % (ref 36.0–46.0)
Hemoglobin: 11.5 g/dL — ABNORMAL LOW (ref 12.0–15.0)
LYMPHS PCT: 11 % — AB (ref 12–46)
Lymphs Abs: 1.4 10*3/uL (ref 0.7–4.0)
MCH: 28.7 pg (ref 26.0–34.0)
MCHC: 31.2 g/dL (ref 30.0–36.0)
MCV: 92 fL (ref 78.0–100.0)
MONO ABS: 1.6 10*3/uL — AB (ref 0.1–1.0)
Monocytes Relative: 13 % — ABNORMAL HIGH (ref 3–12)
Neutro Abs: 9.7 10*3/uL — ABNORMAL HIGH (ref 1.7–7.7)
Neutrophils Relative %: 75 % (ref 43–77)
Platelets: ADEQUATE 10*3/uL (ref 150–400)
RBC: 4.01 MIL/uL (ref 3.87–5.11)
RDW: 17.1 % — ABNORMAL HIGH (ref 11.5–15.5)
WBC: 12.8 10*3/uL — AB (ref 4.0–10.5)

## 2014-07-16 LAB — BASIC METABOLIC PANEL
Anion gap: 12 (ref 5–15)
BUN: 46 mg/dL — ABNORMAL HIGH (ref 6–23)
CALCIUM: 8.7 mg/dL (ref 8.4–10.5)
CHLORIDE: 94 mmol/L — AB (ref 96–112)
CO2: 31 mmol/L (ref 19–32)
Creatinine, Ser: 1.56 mg/dL — ABNORMAL HIGH (ref 0.50–1.10)
GFR calc Af Amer: 32 mL/min — ABNORMAL LOW (ref >90)
GFR, EST NON AFRICAN AMERICAN: 28 mL/min — AB (ref >90)
GLUCOSE: 102 mg/dL — AB (ref 70–99)
Potassium: 3 mmol/L — ABNORMAL LOW (ref 3.5–5.1)
Sodium: 137 mmol/L (ref 135–145)

## 2014-07-16 LAB — PROCALCITONIN: Procalcitonin: 0.59 ng/mL

## 2014-07-16 MED ORDER — VANCOMYCIN HCL IN DEXTROSE 1-5 GM/200ML-% IV SOLN: 1000.0000 mg | INTRAVENOUS | Status: DC

## 2014-07-16 MED ORDER — SORBITOL 70 % SOLN
30.0000 mL | Freq: Once | Status: AC
  Administered 2014-07-16: 30 mL via ORAL
  Filled 2014-07-16: qty 30

## 2014-07-16 MED ORDER — POTASSIUM CHLORIDE CRYS ER 20 MEQ PO TBCR
40.0000 meq | EXTENDED_RELEASE_TABLET | Freq: Once | ORAL | Status: AC
  Administered 2014-07-16: 40 meq via ORAL
  Filled 2014-07-16: qty 2

## 2014-07-16 MED ORDER — POTASSIUM CHLORIDE CRYS ER 20 MEQ PO TBCR
60.0000 meq | EXTENDED_RELEASE_TABLET | Freq: Once | ORAL | Status: AC
  Administered 2014-07-16: 60 meq via ORAL
  Filled 2014-07-16: qty 3

## 2014-07-16 NOTE — Progress Notes (Signed)
RN entered patients room to find her crying. Patient had not called out with call bell despite call bell sitting on patients lap. Patient stated that she was in "incredible pain" and "could not go on like this much longer". RN administered PRN IV pain medication to assist in controlling pain quickly and 10 minutes later administered PRN PO pain medication to assist in controlling pain for a longer period of time. IV pain medication gave the patient little to no relief from pain, neither did repositioning. I was applied to Left hip area. RN will continue to monitor pain levels and notify physician if pain is not under control within 30 minutes. Bosie HelperS. Donae Kueker, RN

## 2014-07-16 NOTE — Progress Notes (Signed)
PULMONARY / CRITICAL CARE MEDICINE   Name: [name] MRN: [id number] DOB: [birth year]    ADMISSION DATE:  07/11/2014 CONSULTATION DATE:  07/15/14  REFERRING MD :  Dr. Janee Mornhompson [facility]  CHIEF COMPLAINT:  Shortness of breath, left hip fracture  INITIAL PRESENTATION: 81 year old female admitted on 07/12/2014 for a left hip fracture, pulmonary and crit of care medicine consultation for ongoing hypoxemic respiratory failure on 07/15/2014.  STUDIES:  07/12/2014 transthoracic echocardiogram> LVEF 55-60%, severe basal hypertrophy and moderate concentric hypertrophy, Doppler parameters consistent with high v entricular filling pressure, ventricular septum is consistent with RV volume overload, RVSP 55 mmHg, right atrium is moderately dilated, pacer wire noted, right ventricle systolic function mildly reduced.  SIGNIFICANT EVENTS:   SUBJECTIVE: Breathing better today, oxygenation and renal function improved  VITAL SIGNS: Temp:  [98.5 F (36.9 C)-101.4 F (38.6 C)] 98.6 F (37 C) (03/09 0400) Pulse Rate:  [72-176] 93 (03/09 1000) Resp:  [10-28] 21 (03/09 1000) BP: (84-130)/(34-88) 108/54 mmHg (03/09 1000) SpO2:  [90 %-99 %] 96 % (03/09 1000) Weight:  [62.2 kg (137 lb 2 oz)] 62.2 kg (137 lb 2 oz) (03/09 0440) HEMODYNAMICS:   VENTILATOR SETTINGS:   INTAKE / OUTPUT:  Intake/Output Summary (Last 24 hours) at 07/16/14 1008 Last data filed at 07/16/14 0900  Gross per 24 hour  Intake     80 ml  Output   2780 ml  Net  -2700 ml    PHYSICAL EXAMINATION: General:  Chronically ill appearing, nasal canula Neuro:  NCAT, MAEW HEENT:  NCAT, EOMi Cardiovascular:  Irreg irreg, no clear murmur,  Lungs:  Crackles bilaterally, no wheezing Abdomen:  BS+, soft, nontender Musculoskeletal:  Mild muscle atrophy consistent with her advanced age, left leg ext rotated Skin:  Thin, bruising noted, no rash or breakdown Ext: warm, acyanotic, no ankle  swelling  LABS:  CBC  Recent Labs Lab 07/14/14 0355 07/15/14 0413 07/16/14 0400  WBC 15.3* 18.8* 12.8*  HGB 11.0* 11.7* 11.5*  HCT 36.5 39.1 36.9  PLT 197 240 PLATELET CLUMPS NOTED ON SMEAR, COUNT APPEARS ADEQUATE   Coag's  Recent Labs Lab 07/11/14 2333  INR 1.10   BMET  Recent Labs Lab 07/14/14 0355 07/15/14 0413 07/16/14 0400  NA 138 139 137  K 3.2* 4.3 3.0*  CL 100 102 94*  CO2 31 28 31   BUN 31* 37* 46*  CREATININE 1.77* 1.70* 1.56*  GLUCOSE 114* 114* 102*   Electrolytes  Recent Labs Lab 07/14/14 0355 07/15/14 0413 07/16/14 0400  CALCIUM 8.4 8.9 8.7  MG 2.0  --   --    Sepsis Markers No results for input(s): LATICACIDVEN, PROCALCITON, O2SATVEN in the last 168 hours. ABG  Recent Labs Lab 07/15/14 1003  PHART 7.420  PCO2ART 42.2  PO2ART 58.7*   Liver Enzymes  Recent Labs Lab 07/12/14 0320  AST 22  ALT 13  ALKPHOS 83  BILITOT 0.8  ALBUMIN 3.6   Cardiac Enzymes  Recent Labs Lab 07/12/14 0320 07/12/14 0914 07/12/14 1508  TROPONINI 0.11* 0.12* 0.11*   Glucose No results for input(s): GLUCAP in the last 168 hours.  Imaging  07/15/2014 chest x-ray> diffuse hazy opacities bilaterally with perhaps a more focal right upper lobe area of airspace disease, likely bilateral pleural effusions November 2014 CT chest images reviewed> bilateral pleural effusions are mild to moderate in size, very mild interstitial changes in the periphery of the lung worse in the bases likely consistent with volume overload, no clear interstitial lung disease.  ASSESSMENT /  PLAN:  Principal Problem:   Acute on chronic respiratory failure with hypoxia Active Problems:   Atrial fibrillation   Elevated troponin   Essential hypertension   Chronic diastolic heart failure   Chronic kidney disease, stage III (moderate)   Femur fracture, left   Closed left hip fracture   Hypokalemia   Pulmonary hypertension   Acute on chronic diastolic heart failure    Intertrochanteric fracture of left femur  Discussion> Oxygenation is improved post diuresis.  She had a fever last night but no mucus production to suggest pneumonia.  CXR unchanged.  I still favor pulmonary edema, I see no role for a CT chest today.  It should be noted her oxygenation at baseline is poor 3L [place] O2 sats normally run between 88-92%   Recommendation: Continue diuresis with furosemide and metolazone Aggressively replace potassium as you are doing Check procalcitonin Push incentive spirometry Make NPO after midnight as if her oxygenation needs continue to improve today with further diuressis she could go to the OR tomorrow.   FAMILY  - Updates: updated daughter Dawn by phone 3/9    Heber [place], MD [place] [facility] Pager: [phone] Cell: [phone] If no response, call [phone]  07/16/2014, 10:08 AM

## 2014-07-16 NOTE — Progress Notes (Addendum)
     SUBJECTIVE: No c/o SOB. Only c/o hip pain.   BP 96/43 mmHg  Pulse 81  Temp(Src) 98.6 F (37 C) (Axillary)  Resp 11  Ht 5\' 2"  (1.575 m)  Wt 137 lb 2 oz (62.2 kg)  BMI 25.07 kg/m2  SpO2 96%  Intake/Output Summary (Last 24 hours) at 07/16/14 0606 Last data filed at 07/16/14 0440  Gross per 24 hour  Intake     60 ml  Output   2230 ml  Net  -2170 ml    PHYSICAL EXAM General: elderly, frail female. Alert and oriented x 3.  Psych:  Good affect, responds appropriately Neck: No JVD. No masses noted.  Lungs: No crackles or wheezing  Heart: RRR with no murmurs noted. Abdomen: Bowel sounds are present. Soft, non-tender.  Extremities: No lower extremity edema.   LABS: Basic Metabolic Panel:  Recent Labs  16/02/9602/07/16 0355 07/15/14 0413 07/16/14 0400  NA 138 139 137  K 3.2* 4.3 3.0*  CL 100 102 94*  CO2 31 28 31   GLUCOSE 114* 114* 102*  BUN 31* 37* 46*  CREATININE 1.77* 1.70* 1.56*  CALCIUM 8.4 8.9 8.7  MG 2.0  --   --    CBC:  Recent Labs  07/15/14 0413 07/16/14 0400  WBC 18.8* 12.8*  NEUTROABS  --  9.7*  HGB 11.7* 11.5*  HCT 39.1 36.9  MCV 94.4 92.0  PLT 240 PLATELET CLUMPS NOTED ON SMEAR, COUNT APPEARS ADEQUATE   Current Meds: . antiseptic oral rinse  7 mL Mouth Rinse BID  . atenolol  25 mg Oral BID  . buPROPion  300 mg Oral Daily  . docusate sodium  100 mg Oral BID  . furosemide  80 mg Intravenous BID  . levothyroxine  88 mcg Oral QAC breakfast  . metolazone  5 mg Oral Daily  . pantoprazole  40 mg Oral QHS    ASSESSMENT AND PLAN:  1. Hip fracture: Planning for hip repair per ortho when pulmonary status is optimized.   2. Acute on chronic diastolic CHF: She is on IV Lasix and now daily metolazone and has had good diuresis. (-4.5 liters since admission). Renal function is stable. She appears to be at baseline volume status and does not appear volume overloaded. She continues to require high levels of supplemental O2 with partial NRB mask. We have  changed her to a mask this am with 10 liters and she dropped her sats to 85%. Will discuss with pulmonary team this am. As there is no other good explanation for her respiratory issues, would continue diuresis. It is not clear when it will be optimal to plan her surgery.     3. Elevated troponin: Felt to be demand ischemia. No ischemic evaluation is planned.   4. Chronic atrial fibrillation: Not on long term anti-coagulation due to prior bleeding on coumadin. Rate is controlled on atenolol.   5. Acute on Chronic respiratory failure: She is known to have chronic respiratory failure and is on supplemental home O2 at home with history of amiodarone induced lung toxicity. Pulmonary team is now following. She may have had a slight improvement over last 24 hours. Follow O2 sats this am while titrating down supplemental O2.   6. Hypokalemia: Potassium replaced this am  MCALHANY,CHRISTOPHER  3/9/20166:06 AM

## 2014-07-16 NOTE — Progress Notes (Signed)
[facility] PROGRESS NOTE  [name] YNW:[id number] DOB: Apr 25, 1920 DOA: 07/11/2014 PCP: Carollee Herter, MD  Brief narrative:    81 y.o. 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 Due to multiple co morbid conditions, cariology for consulted for pre op clearance prior to surgery.   Events since admission: 3/4 - admitted to telemetry unit, trop elevated 0.11 3/5 - persistently hypoxic with O2 sat in 80's on 6 L [place] and up to low 90's with venti mask, transferred to SDU       Assessment/Plan: #1 acute on chronic respiratory failure Likely multifactorial secondary to acute on chronic diastolic heart failure in the setting of atrial fibrillation, chronic COPD on home O2, pulmonary hypertension. Patient on  with some improvement in respiratory status. Chest x-ray today consistent with volume overload. Patient is -5.15L since admission 07/12/14. Current weight at 137 pounds from 138 pounds. Troponins were elevated however felt to be secondary to demand ischemia. 2-D echo with EF of 55%. Continue current IV Lasix. Continue atenolol,Zaroxyln. Monitor oxygen requirements. Cardiology following and appreciate input and recommendations. PCCM ff and appreciate input and rxcs.   #2 acute on chronic diastolic heart failure Patient with elevated troponins felt to be secondary to a demand ischemia. 2-D echo with EF of 55%. Patient with good diuresis and is -5.1 L since admission 07/12/14. Current weight 137 LBs from 138 pounds. Continue IV Lasix and beta blocker. Zaroxyln to be added per pulmonary. Cardiology following and appreciate input  and recommendations.  #3 left intertrochanteric hip fracture Patient is being seen by orthopedics. Awaiting improvement in pulmonary status prior to surgery per cardiology. Per pulmonary patient she'll be stable for probable surgery tomorrow from a pulmonary standpoint.  #4 hypothyroidism Continue home dose Synthroid.  #5 atrial fibrillation Continue atenolol for rate control. Patient not deemed a long-term anticoagulation candidate secondary to prior bleeding on Coumadin. Per cardiology.  #6 hypokalemia Likely secondary to diuresis. Replete.  #7 elevated troponin Felt to be secondary to demand ischemia. Per cardiology no ischemic workup planned at this time.  #8 COPD Stable. Nebs as needed.  #9 hypertension Stable. On atenolol. Monitor on IV Lasix.  #10 chronic kidney disease stage IV Currently stable. Baseline creatinine is 2.1-2.3. Monitor closely with diuresis.  #11 severe protein calorie malnutrition Continue current diet. Follow.  #12 prophylaxis SCDs for DVT prophylaxis.  Code Status: Full Family Communication: Updated patient and daughter at bedside. Disposition Plan: Remain in the step down.   Consultants:  Orthopedics: Dr. supple 07/12/2014  Cardiology: Dr. Ace Gins 07/12/2014  PCCM Dr Kendrick Fries 07/15/14  Procedures:  Chest x-ray 07/11/2014, 07/13/2014  2-D echo 07/12/2014  X-ray of the left femur 07/12/2014  X-ray of the left hip with pelvis 07/11/2014  Antibiotics:  None  HPI/Subjective: Patient with complaints of left hip pain.  Objective: Filed Vitals:   07/16/14 0800  BP: 130/50  Pulse: 79  Temp:   Resp: 18    Intake/Output Summary (Last 24 hours) at 07/16/14 0912 Last data filed at 07/16/14 0600  Gross per 24 hour  Intake     60 ml  Output   2305  ml  Net  -2245 ml   Filed Weights   07/14/14 0400 07/15/14 0400 07/16/14 0440  Weight: 62.9 kg (138 lb 10.7 oz) 61.8 kg (136 lb 3.9 oz) 62.2 kg (137 lb 2 oz)    Exam:   General:   On [place] 6-7L  Cardiovascular: Irregularly irregular. Positive JVD.  Respiratory: Bibasilar crackles. No wheezing.  Abdomen: Soft, nontender, nondistended, positive bowel sounds.  Musculoskeletal: No clubbing cyanosis or edema. Left lower extremity shortened and externally rotated.  Data Reviewed: Basic Metabolic Panel:  Recent Labs Lab 07/12/14 0320 07/13/14 0405 07/14/14 0355 07/15/14 0413 07/16/14 0400  NA 137 140 138 139 137  K 3.2* 4.0 3.2* 4.3 3.0*  CL 101 107 100 102 94*  CO2 GLUCOSE 117* 109* 114* 114* 102*  BUN 39* 29* 31* 37* 46*  CREATININE 1.94* 1.65* 1.77* 1.70* 1.56*  CALCIUM 8.7 8.8 8.4 8.9 8.7  MG  --   --  2.0  --   --    Liver Function Tests:  Recent Labs Lab 07/12/14 0320  AST 22  ALT 13  ALKPHOS 83  BILITOT 0.8  PROT 6.1  ALBUMIN 3.6   No results for input(s): LIPASE, AMYLASE in the last 168 hours. No results for input(s): AMMONIA in the last 168 hours. CBC:  Recent Labs Lab 07/11/14 2333 07/12/14 0320 07/13/14 0405 07/14/14 0355 07/15/14 0413 07/16/14 0400  WBC 13.6* 12.9* 11.6* 15.3* 18.8* 12.8*  NEUTROABS 11.1* 9.5*  --   --   --  9.7*  HGB 11.9* 11.3* 11.0* 11.0* 11.7* 11.5*  HCT 37.2 36.1 36.2 36.5 39.1 36.9  MCV 91.9 91.6 93.5 94.1 94.4 92.0  PLT 241 237 201 197 240 PLATELET CLUMPS NOTED ON SMEAR, COUNT APPEARS ADEQUATE   Cardiac Enzymes:  Recent Labs Lab 07/11/14 2328 07/12/14 0320 07/12/14 0914 07/12/14 1508  TROPONINI 0.11* 0.11* 0.12* 0.11*   BNP (last 3 results)  Recent Labs  07/11/14 2328 07/15/14 0413  BNP 299.6* 359.3*    ProBNP (last 3 results)  Recent Labs  07/25/13 1215  PROBNP 4206.0*    CBG: No results for input(s): GLUCAP in the last 168 hours.  Recent Results (from the past 240 hour(s))  Urine culture     Status: None   Collection Time: 07/12/14  2:25 AM  Result Value Ref Range Status   Specimen Description URINE, CATHETERIZED  Final   Special Requests Normal  Final    Colony Count NO GROWTH Performed at [facility]   Final   Culture NO GROWTH Performed at [facility]   Final   Report Status 07/13/2014 FINAL  Final  Surgical pcr screen     Status: None   Collection Time: 07/12/14 12:54 PM  Result Value Ref Range Status   MRSA, PCR NEGATIVE NEGATIVE Final   Staphylococcus aureus NEGATIVE NEGATIVE Final    Comment:        The Xpert SA Assay (FDA approved for NASAL specimens in patients over 58 years of age), is one component of a comprehensive surveillance program.  Test performance has been validated by [facility] for patients greater than or equal to 64 year old. It is not intended to diagnose infection nor to guide or monitor treatment.      Studies: Dg Chest Port 1 View  07/16/2014   CLINICAL DATA:  Shortness of breath, hypertension.  EXAM: PORTABLE CHEST - 1 VIEW  COMPARISON:  07/15/2014  FINDINGS: Left pacer is in  place with leads in the right atrium and right ventricle, unchanged. Cardiomegaly. Persistent interstitial and alveolar opacities throughout the lungs, not significantly changed, likely edema. Pneumonia cannot be completely excluded. No visible effusions. No acute bony abnormality.  IMPRESSION: Persistent bilateral interstitial and alveolar opacities, edema versus infection. No real change.   Electronically Signed   By: Charlett NoseKevin  Dover M.D.   On: 07/16/2014 08:34   Dg Chest Port 1 View  07/15/2014   CLINICAL DATA:  Shortness of breath.  EXAM: PORTABLE CHEST - 1 VIEW  COMPARISON:  07/13/2014.  FINDINGS: Mediastinum and hilar structures are normal. Cardiac pacer stable position. Stable cardiomegaly. Stable diffuse from interstitial prominence. Congestive heart failure could present in this fashion. Pneumonitis cannot be excluded. No pleural effusion or pneumothorax.  IMPRESSION: Persistent cardiomegaly with bilateral pulmonary interstitial prominence suggesting congestive heart failure. Pneumonitis cannot be excluded.    Electronically Signed   By: Maisie Fushomas  Register   On: 07/15/2014 07:19    Scheduled Meds: . antiseptic oral rinse  7 mL Mouth Rinse BID  . atenolol  25 mg Oral BID  . buPROPion  300 mg Oral Daily  . docusate sodium  100 mg Oral BID  . furosemide  80 mg Intravenous BID  . levothyroxine  88 mcg Oral QAC breakfast  . metolazone  5 mg Oral Daily  . pantoprazole  40 mg Oral QHS  . potassium chloride  40 mEq Oral Once   Continuous Infusions: . sodium chloride Stopped (07/15/14 1920)    Principal Problem:   Acute on chronic respiratory failure with hypoxia Active Problems:   Closed left hip fracture   Acute on chronic diastolic heart failure   Atrial fibrillation   Elevated troponin   Essential hypertension   Chronic diastolic heart failure   Chronic kidney disease, stage III (moderate)   Femur fracture, left   Hypokalemia   Pulmonary hypertension   Intertrochanteric fracture of left femur    Time spent: 40 minutes    THOMPSON,DANIEL M.D. [facility] Pager [phone]. If 7PM-7AM, please contact night-coverage at [email or web address], password [email or web address] 07/16/2014, 9:12 AM  LOS: 4 days

## 2014-07-16 NOTE — Progress Notes (Addendum)
C/o of L hip pain. Currently on [place].  LLE: shortened and externally rotated. 2+ DP. +TA/GS/EHL. SILT.  L peritroch femur fx  Current current care Plan for OR when medically clear     ADDENDUM: Upon reviewing Pulm/CC progress note, surgery is posted for tomorrow evening. Please make NPO after MN tonight.

## 2014-07-17 ENCOUNTER — Inpatient Hospital Stay (HOSPITAL_COMMUNITY): Payer: MEDICARE

## 2014-07-17 ENCOUNTER — Inpatient Hospital Stay (HOSPITAL_COMMUNITY): Payer: MEDICARE | Admitting: Anesthesiology

## 2014-07-17 ENCOUNTER — Encounter (HOSPITAL_COMMUNITY)
Admission: EM | Disposition: A | Discharge status: Skilled Nursing Facility | Payer: Self-pay | Source: Home / Self Care | Attending: Internal Medicine

## 2014-07-17 DIAGNOSIS — I481 Persistent atrial fibrillation: Secondary | ICD-10-CM

## 2014-07-17 DIAGNOSIS — S7292XK Unspecified fracture of left femur, subsequent encounter for closed fracture with nonunion: Secondary | ICD-10-CM

## 2014-07-17 HISTORY — PX: FEMUR IM NAIL: SHX1597

## 2014-07-17 LAB — BASIC METABOLIC PANEL
Anion gap: 12 (ref 5–15)
BUN: 63 mg/dL — AB (ref 6–23)
CHLORIDE: 95 mmol/L — AB (ref 96–112)
CO2: 32 mmol/L (ref 19–32)
Calcium: 9 mg/dL (ref 8.4–10.5)
Creatinine, Ser: 1.97 mg/dL — ABNORMAL HIGH (ref 0.50–1.10)
GFR calc Af Amer: 24 mL/min — ABNORMAL LOW (ref >90)
GFR, EST NON AFRICAN AMERICAN: 21 mL/min — AB (ref >90)
Glucose, Bld: 86 mg/dL (ref 70–99)
Potassium: 3.4 mmol/L — ABNORMAL LOW (ref 3.5–5.1)
SODIUM: 139 mmol/L (ref 135–145)

## 2014-07-17 LAB — CBC
HCT: 34.2 % — ABNORMAL LOW (ref 36.0–46.0)
HEMOGLOBIN: 10.5 g/dL — AB (ref 12.0–15.0)
MCH: 28.3 pg (ref 26.0–34.0)
MCHC: 30.7 g/dL (ref 30.0–36.0)
MCV: 92.2 fL (ref 78.0–100.0)
PLATELETS: 241 10*3/uL (ref 150–400)
RBC: 3.71 MIL/uL — ABNORMAL LOW (ref 3.87–5.11)
RDW: 17.1 % — AB (ref 11.5–15.5)
WBC: 14.2 10*3/uL — AB (ref 4.0–10.5)

## 2014-07-17 LAB — TYPE AND SCREEN
ABO/RH(D): A POS
Antibody Screen: NEGATIVE

## 2014-07-17 LAB — PROCALCITONIN: PROCALCITONIN: 0.67 ng/mL

## 2014-07-17 SURGERY — INSERTION, INTRAMEDULLARY ROD, FEMUR
Anesthesia: Spinal | Laterality: Left

## 2014-07-17 MED ORDER — ONDANSETRON HCL 4 MG/2ML IJ SOLN
INTRAMUSCULAR | Status: AC
  Filled 2014-07-17: qty 2

## 2014-07-17 MED ORDER — CLINDAMYCIN PHOSPHATE 900 MG/50ML IV SOLN: 900.0000 mg | Freq: Once | INTRAVENOUS | Status: DC

## 2014-07-17 MED ORDER — CLINDAMYCIN PHOSPHATE 900 MG/50ML IV SOLN
INTRAVENOUS | Status: AC
  Filled 2014-07-17: qty 50

## 2014-07-17 MED ORDER — MEPERIDINE HCL 25 MG/ML IJ SOLN: 6.2500 mg | INTRAMUSCULAR | Status: DC | PRN

## 2014-07-17 MED ORDER — HYDROGEN PEROXIDE 3 % EX SOLN
CUTANEOUS | Status: AC
  Filled 2014-07-17: qty 473

## 2014-07-17 MED ORDER — KETAMINE HCL 10 MG/ML IJ SOLN
INTRAMUSCULAR | Status: AC
Start: 2014-07-17 — End: 2014-07-17
  Filled 2014-07-17: qty 1

## 2014-07-17 MED ORDER — PROPOFOL INFUSION 10 MG/ML OPTIME
INTRAVENOUS | Status: DC | PRN
  Administered 2014-07-17: 75 ug/kg/min via INTRAVENOUS

## 2014-07-17 MED ORDER — PROPOFOL 10 MG/ML IV BOLUS
INTRAVENOUS | Status: AC
  Filled 2014-07-17: qty 20

## 2014-07-17 MED ORDER — BUPIVACAINE HCL (PF) 0.5 % IJ SOLN
INTRAMUSCULAR | Status: DC | PRN
  Administered 2014-07-17: 3 mL

## 2014-07-17 MED ORDER — ETOMIDATE 2 MG/ML IV SOLN
INTRAVENOUS | Status: AC
Start: 2014-07-17 — End: 2014-07-17
  Filled 2014-07-17: qty 10

## 2014-07-17 MED ORDER — FENTANYL CITRATE 0.05 MG/ML IJ SOLN
25.0000 ug | INTRAMUSCULAR | Status: DC | PRN
Start: 2014-07-17 — End: 2014-07-18

## 2014-07-17 MED ORDER — ISOPROPYL ALCOHOL 70 % SOLN
Status: AC
Start: 2014-07-17 — End: 2014-07-17
  Filled 2014-07-17: qty 480

## 2014-07-17 MED ORDER — PHENYLEPHRINE HCL 10 MG/ML IJ SOLN
INTRAMUSCULAR | Status: AC
  Filled 2014-07-17: qty 1

## 2014-07-17 MED ORDER — PHENYLEPHRINE HCL 10 MG/ML IJ SOLN
10.0000 mg | INTRAVENOUS | Status: DC | PRN
  Administered 2014-07-17: 10 ug/min via INTRAVENOUS

## 2014-07-17 MED ORDER — PROPOFOL 10 MG/ML IV BOLUS
INTRAVENOUS | Status: DC | PRN
  Administered 2014-07-17: 75 mg via INTRAVENOUS

## 2014-07-17 MED ORDER — FENTANYL CITRATE 0.05 MG/ML IJ SOLN
INTRAMUSCULAR | Status: AC
  Filled 2014-07-17: qty 5

## 2014-07-17 MED ORDER — KETAMINE HCL 10 MG/ML IJ SOLN
INTRAMUSCULAR | Status: DC | PRN
  Administered 2014-07-17 (×2): 30 mg via INTRAVENOUS

## 2014-07-17 MED ORDER — LIDOCAINE HCL (CARDIAC) 20 MG/ML IV SOLN
INTRAVENOUS | Status: AC
  Filled 2014-07-17: qty 5

## 2014-07-17 SURGICAL SUPPLY — 43 items
BAG SPEC THK2 15X12 ZIP CLS (MISCELLANEOUS)
BAG ZIPLOCK 12X15 (MISCELLANEOUS) IMPLANT
BIT DRILL AO GAMMA 4.2X180 (BIT) ×3 IMPLANT
BNDG GAUZE ELAST 4 BULKY (GAUZE/BANDAGES/DRESSINGS) IMPLANT
CHLORAPREP W/TINT 26ML (MISCELLANEOUS) ×6 IMPLANT
COVER PERINEAL POST (MISCELLANEOUS) ×3 IMPLANT
DRAPE C-ARM 42X120 X-RAY (DRAPES) ×3 IMPLANT
DRAPE C-ARMOR (DRAPES) ×3 IMPLANT
DRAPE INCISE IOBAN 66X45 STRL (DRAPES) IMPLANT
DRAPE ORTHO SPLIT 77X108 STRL (DRAPES) ×6
DRAPE SHEET LG 3/4 BI-LAMINATE (DRAPES) ×6 IMPLANT
DRAPE STERI IOBAN 125X83 (DRAPES) IMPLANT
DRAPE SURG ORHT 6 SPLT 77X108 (DRAPES) ×2 IMPLANT
DRAPE U-SHAPE 47X51 STRL (DRAPES) ×6 IMPLANT
DRSG MEPILEX BORDER 4X4 (GAUZE/BANDAGES/DRESSINGS) ×12 IMPLANT
FACESHIELD WRAPAROUND (MASK) ×6 IMPLANT
GAUZE SPONGE 4X4 12PLY STRL (GAUZE/BANDAGES/DRESSINGS) ×3 IMPLANT
GLOVE BIOGEL PI IND STRL 8.5 (GLOVE) ×2 IMPLANT
GLOVE BIOGEL PI INDICATOR 8.5 (GLOVE) ×4
GLOVE SURG ORTHO 8.5 STRL (GLOVE) ×27 IMPLANT
GOWN SPEC L3 XXLG W/TWL (GOWN DISPOSABLE) ×12 IMPLANT
GUIDEROD T2 3X1000 (ROD) ×3 IMPLANT
K-WIRE  3.2X450M STR (WIRE) ×2
K-WIRE 3.2X450M STR (WIRE) ×1
KIT BASIN OR (CUSTOM PROCEDURE TRAY) ×3 IMPLANT
KWIRE 3.2X450M STR (WIRE) ×1 IMPLANT
LIQUID BAND (GAUZE/BANDAGES/DRESSINGS) ×3 IMPLANT
MANIFOLD NEPTUNE II (INSTRUMENTS) ×3 IMPLANT
NAIL GAMMA LONG KIT RT 5 LEFT (Nail) ×3 IMPLANT
PACK TOTAL JOINT (CUSTOM PROCEDURE TRAY) ×3 IMPLANT
POSITIONER SURGICAL ARM (MISCELLANEOUS) IMPLANT
REAMER SHAFT BIXCUT (INSTRUMENTS) ×3 IMPLANT
SCREW LAG GAMMA 3 TI 10.5X90MM (Screw) ×3 IMPLANT
SCREW LOCKING T2 F/T  5X42.5MM (Screw) ×2 IMPLANT
SCREW LOCKING T2 F/T 5X42.5MM (Screw) ×1 IMPLANT
SUT MNCRL AB 3-0 PS2 18 (SUTURE) ×6 IMPLANT
SUT MON AB 2-0 SH 27 (SUTURE) ×6
SUT MON AB 2-0 SH27 (SUTURE) ×2 IMPLANT
SUT VIC AB 1 CT1 27 (SUTURE) ×3
SUT VIC AB 1 CT1 27XBRD ANTBC (SUTURE) ×1 IMPLANT
SUT VIC AB 2-0 CT1 27 (SUTURE)
SUT VIC AB 2-0 CT1 27XBRD (SUTURE) IMPLANT
TUBE CLIP GAMMA CLOSED ×3 IMPLANT

## 2014-07-17 NOTE — Transfer of Care (Signed)
Immediate Anesthesia Transfer of Care Note  Patient: [name]  Procedure(s) Performed: Procedure(s): INTRAMEDULLARY (IM) NAIL FEMORAL (Left)  Patient Location: PACU  Anesthesia Type:Spinal  Level of Consciousness: awake, sedated and confused  Airway & Oxygen Therapy: Patient Spontanous Breathing and Patient connected to face mask oxygen  Post-op Assessment: Report given to RN and Post -op Vital signs reviewed and stable  Post vital signs: stable  Last Vitals:  Filed Vitals:   07/17/14 1800  BP: 137/74  Pulse: 86  Temp:   Resp: 18    Complications: No apparent anesthesia complications

## 2014-07-17 NOTE — Anesthesia Procedure Notes (Signed)
Spinal Patient location during procedure: OR Staffing Anesthesiologist: March Joos Performed by: anesthesiologist  Preanesthetic Checklist Completed: patient identified, site marked, surgical consent, pre-op evaluation, timeout performed, IV checked, risks and benefits discussed and monitors and equipment checked Spinal Block Patient position: right lateral decubitus Prep: Betadine Patient monitoring: heart rate, continuous pulse ox and blood pressure Approach: left paramedian Location: L2-3 Injection technique: single-shot Needle Needle type: Spinocan  Needle gauge: 22 G Needle length: 9 cm Additional Notes Expiration date of kit checked and confirmed. Patient tolerated procedure well, without complications.     

## 2014-07-17 NOTE — Progress Notes (Signed)
C/o of L hip pain. Currently on [place].  LLE: shortened and externally rotated. 2+ DP. +TA/GS/EHL. SILT.  L peritroch femur fx  Current current care NPO Plan for OR this afternoon/evening

## 2014-07-17 NOTE — Progress Notes (Signed)
[facility] PROGRESS NOTE  MINDY GALI ZOX:[id number] DOB: 1919-12-14 DOA: 07/11/2014 PCP: Carollee Herter, MD  Brief narrative:    81 y.o. 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 Due to multiple co morbid conditions, cariology for consulted for pre op clearance prior to surgery.   Events since admission: 3/4 - admitted to telemetry unit, trop elevated 0.11 3/5 - persistently hypoxic with O2 sat in 80's on 6 L [place] and up to low 90's with venti mask, transferred to SDU       Assessment/Plan: #1 acute on chronic respiratory failure Likely multifactorial secondary to acute on chronic diastolic heart failure in the setting of atrial fibrillation, chronic COPD on home O2, pulmonary hypertension. Patient on [place] with some improvement in respiratory status. Chest x-ray today consistent with volume overload. Patient is -4.8L since admission 07/12/14. Current weight at 134lb from 137 pounds from 138 pounds. Troponins were elevated however felt to be secondary to demand ischemia. 2-D echo with EF of 55%. Continue current IV Lasix, atenolol,Zaroxyln. Monitor oxygen requirements. Cardiology following and appreciate input and recommendations. PCCM ff and appreciate input and rxcs.   #2 acute on chronic diastolic heart failure Patient with elevated troponins felt to be secondary to a demand ischemia. 2-D echo with EF of 55%. Patient with good diuresis and is -4.8 L since admission 07/12/14. Current weight 134lbs from 137 LBs from 138 pounds. Continue IV Lasix and beta blocker. Zaroxyln to be added per pulmonary. Cardiology following and  appreciate input and recommendations.  #3 left intertrochanteric hip fracture Patient is being seen by orthopedics. Awaiting improvement in pulmonary status prior to surgery per cardiology. Patient currently on 6L [place]. Respiratory status improving. Likely close to baseline. Pulmonary to reassess today to see if ok for surgery. Patient tentatively planned for surgery this afternoon pending pulm eval.  #4 hypothyroidism Continue home dose Synthroid.  #5 atrial fibrillation Continue atenolol for rate control. Patient not deemed a long-term anticoagulation candidate secondary to prior bleeding on Coumadin. Per cardiology.  #6 hypokalemia Likely secondary to diuresis. BMET pending.  #7 elevated troponin Felt to be secondary to demand ischemia. Per cardiology no ischemic workup planned at this time.  #8 COPD Stable. Nebs as needed.  #9 hypertension Stable. On atenolol. Monitor on Lasix.  #10 chronic kidney disease stage IV Currently stable. Baseline creatinine is 2.1-2.3. Monitor closely with diuresis. BMET pending.  #11 severe protein calorie malnutrition Continue current diet. Follow.  #12 prophylaxis SCDs for DVT prophylaxis.  Code Status: Full Family Communication: Updated patient and care giver at bedside. Disposition Plan: Remain in the step down.   Consultants:  Orthopedics: Dr. supple 07/12/2014  Cardiology: Dr. Ace Gins 07/12/2014  PCCM Dr Kendrick Fries 07/15/14  Procedures:  Chest x-ray 07/11/2014, 07/13/2014  2-D echo 07/12/2014  X-ray of the left femur 07/12/2014  X-ray of the left hip with pelvis 07/11/2014  Antibiotics:  None  HPI/Subjective: Patient with complaints of left hip pain. Denies CP. Feels breathing at baseline.  Objective: Filed Vitals:   07/17/14 0815  BP:   Pulse:   Temp: 98.3 F (36.8 C)  Resp:  Intake/Output Summary (Last 24 hours) at 07/17/14 0947 Last data filed at 07/17/14 0300  Gross per 24 hour  Intake   1420 ml  Output    1165 ml  Net    255 ml   Filed Weights   07/15/14 0400 07/16/14 0440 07/17/14 0800  Weight: 61.8 kg (136 lb 3.9 oz) 62.2 kg (137 lb 2 oz) 60.8 kg (134 lb 0.6 oz)    Exam:   General:  On [place] 6L  Cardiovascular: Irregularly irregular. Positive JVD.  Respiratory: Bibasilar crackles. No wheezing.  Abdomen: Soft, nontender, nondistended, positive bowel sounds.  Musculoskeletal: No clubbing cyanosis or edema. Left lower extremity shortened and externally rotated.  Data Reviewed: Basic Metabolic Panel:  Recent Labs Lab 07/12/14 0320 07/13/14 0405 07/14/14 0355 07/15/14 0413 07/16/14 0400  NA 137 140 138 139 137  K 3.2* 4.0 3.2* 4.3 3.0*  CL 101 107 100 102 94*  CO2 29 28 31 28 31   GLUCOSE 117* 109* 114* 114* 102*  BUN 39* 29* 31* 37* 46*  CREATININE 1.94* 1.65* 1.77* 1.70* 1.56*  CALCIUM 8.7 8.8 8.4 8.9 8.7  MG  --   --  2.0  --   --    Liver Function Tests:  Recent Labs Lab 07/12/14 0320  AST 22  ALT 13  ALKPHOS 83  BILITOT 0.8  PROT 6.1  ALBUMIN 3.6   No results for input(s): LIPASE, AMYLASE in the last 168 hours. No results for input(s): AMMONIA in the last 168 hours. CBC:  Recent Labs Lab 07/11/14 2333 07/12/14 0320 07/13/14 0405 07/14/14 0355 07/15/14 0413 07/16/14 0400 07/17/14 0415  WBC 13.6* 12.9* 11.6* 15.3* 18.8* 12.8* 14.2*  NEUTROABS 11.1* 9.5*  --   --   --  9.7*  --   HGB 11.9* 11.3* 11.0* 11.0* 11.7* 11.5* 10.5*  HCT 37.2 36.1 36.2 36.5 39.1 36.9 34.2*  MCV 91.9 91.6 93.5 94.1 94.4 92.0 92.2  PLT 241 237 201 197 240 PLATELET CLUMPS NOTED ON SMEAR, COUNT APPEARS ADEQUATE 241   Cardiac Enzymes:  Recent Labs Lab 07/11/14 2328 07/12/14 0320 07/12/14 0914 07/12/14 1508  TROPONINI 0.11* 0.11* 0.12* 0.11*   BNP (last 3 results)  Recent Labs  07/11/14 2328 07/15/14 0413  BNP 299.6* 359.3*    ProBNP (last 3 results)  Recent Labs  07/25/13 1215  PROBNP 4206.0*    CBG: No results for input(s): GLUCAP in the last 168  hours.  Recent Results (from the past 240 hour(s))  Urine culture     Status: None   Collection Time: 07/12/14  2:25 AM  Result Value Ref Range Status   Specimen Description URINE, CATHETERIZED  Final   Special Requests Normal  Final   Colony Count NO GROWTH Performed at [facility]   Final   Culture NO GROWTH Performed at [facility]   Final   Report Status 07/13/2014 FINAL  Final  Surgical pcr screen     Status: None   Collection Time: 07/12/14 12:54 PM  Result Value Ref Range Status   MRSA, PCR NEGATIVE NEGATIVE Final   Staphylococcus aureus NEGATIVE NEGATIVE Final    Comment:        The Xpert SA Assay (FDA approved for NASAL specimens in patients over 79 years of age), is one component of a comprehensive surveillance program.  Test performance has been validated by [facility] for patients greater than or equal to 79 year old. It is not intended to diagnose infection nor to guide  or monitor treatment.      Studies: Dg Chest Port 1 View  07/16/2014   CLINICAL DATA:  Shortness of breath, hypertension.  EXAM: PORTABLE CHEST - 1 VIEW  COMPARISON:  07/15/2014  FINDINGS: Left pacer is in place with leads in the right atrium and right ventricle, unchanged. Cardiomegaly. Persistent interstitial and alveolar opacities throughout the lungs, not significantly changed, likely edema. Pneumonia cannot be completely excluded. No visible effusions. No acute bony abnormality.  IMPRESSION: Persistent bilateral interstitial and alveolar opacities, edema versus infection. No real change.   Electronically Signed   By: Charlett Nose M.D.   On: 07/16/2014 08:34    Scheduled Meds: . antiseptic oral rinse  7 mL Mouth Rinse BID  . atenolol  25 mg Oral BID  . buPROPion  300 mg Oral Daily  . docusate sodium  100 mg Oral BID  . furosemide  80 mg Intravenous BID  . levothyroxine  88 mcg Oral QAC breakfast  . metolazone  5 mg Oral Daily  . pantoprazole  40 mg Oral QHS    Continuous Infusions: . sodium chloride Stopped (07/15/14 1920)    Principal Problem:   Acute on chronic respiratory failure with hypoxia Active Problems:   Closed left hip fracture   Acute on chronic diastolic heart failure   Atrial fibrillation   Elevated troponin   Essential hypertension   Chronic diastolic heart failure   Chronic kidney disease, stage III (moderate)   Femur fracture, left   Hypokalemia   Pulmonary hypertension   Intertrochanteric fracture of left femur   Dyspnea    Time spent: 40 minutes    Vickey Ewbank M.D. [facility] Pager [phone]. If 7PM-7AM, please contact night-coverage at [email or web address], password [facility] 07/17/2014, 9:47 AM  LOS: 5 days

## 2014-07-17 NOTE — Interval H&P Note (Signed)
History and Physical Interval Note:  07/17/2014 7:25 PM  [name]  has presented today for surgery, with the diagnosis of fractured left hip  The various methods of treatment have been discussed with the patient and family. After consideration of risks, benefits and other options for treatment, the patient has consented to  Procedure(s): INTRAMEDULLARY (IM) NAIL FEMORAL (Left) as a surgical intervention .  The patient's history has been reviewed, patient examined, no change in status, stable for surgery.  I have reviewed the patient's chart and labs.  Questions were answered to the patient's satisfaction.     They understand she is very high risk. Appropriate for surgery due to pain and inability to mobilize.  Zadyn Yardley, Cloyde ReamsBrian James

## 2014-07-17 NOTE — Addendum Note (Signed)
Addendum  created 07/17/14 2223 by Illene SilverJanet E Jaleisa Brose, CRNA   Modules edited: Anesthesia Events, Anesthesia Flowsheet, Anesthesia LDA, Anesthesia Medication Administration, Anesthesia Responsible Staff, Charges VN, Lines/Drains/Airways Properties Editor, Narrator, Notes Section, Orders   Lines/Drains/Airways Properties Editor:  Properties of line/drain/airway/wound Peripheral IV 07/17/14 Right Hand have been modified.; Properties of line/drain/airway/wound Airway have been modified.   Narrator:  Narrator: Event Log Edited; Narrator: Event Log Edited   Notes Section:  File: [id number]

## 2014-07-17 NOTE — Addendum Note (Signed)
Addendum  created 07/17/14 2005 by Phillips GroutPeter Reily Ilic, MD   Modules edited: Anesthesia Review and Sign Navigator Section, Clinical Notes   Clinical Notes:  File: [id number]; File: [id number]

## 2014-07-17 NOTE — Progress Notes (Signed)
PULMONARY / CRITICAL CARE MEDICINE   Name: [name] MRN: [id number] DOB: 11-10-1919    ADMISSION DATE:  07/11/2014 CONSULTATION DATE:  07/15/14  REFERRING MD :  Dr. Janee Mornhompson [facility]  CHIEF COMPLAINT:  Shortness of breath, left hip fracture  INITIAL PRESENTATION: 81 year old female admitted on 07/12/2014 for a left hip fracture, pulmonary and crit of care medicine consultation for ongoing hypoxemic respiratory failure on 07/15/2014.  STUDIES:  07/12/2014 transthoracic echocardiogram> LVEF 55-60%, severe basal hypertrophy and moderate concentric hypertrophy, Doppler parameters consistent with high v entricular filling pressure, ventricular septum is consistent with RV volume overload, RVSP 55 mmHg, right atrium is moderately dilated, pacer wire noted, right ventricle systolic function mildly reduced.  SIGNIFICANT EVENTS:   SUBJECTIVE: oxygenation improved, back to baseline  VITAL SIGNS: Temp:  [98.1 F (36.7 C)-98.5 F (36.9 C)] 98.3 F (36.8 C) (03/10 0815) Pulse Rate:  [73-96] 79 (03/10 0800) Resp:  [9-22] 9 (03/10 0800) BP: (91-125)/(41-59) 104/59 mmHg (03/10 0800) SpO2:  [90 %-100 %] 93 % (03/10 0800) Weight:  [60.8 kg (134 lb 0.6 oz)] 60.8 kg (134 lb 0.6 oz) (03/10 0800) HEMODYNAMICS:   VENTILATOR SETTINGS:   INTAKE / OUTPUT:  Intake/Output Summary (Last 24 hours) at 07/17/14 1047 Last data filed at 07/17/14 0300  Gross per 24 hour  Intake   1180 ml  Output   1165 ml  Net     15 ml    PHYSICAL EXAMINATION: General:  Chronically ill appearing, nasal canula HEENT: NCAT, [place] in place PULM: Few crackles in bases  LABS:  CBC  Recent Labs Lab 07/15/14 0413 07/16/14 0400 07/17/14 0415  WBC 18.8* 12.8* 14.2*  HGB 11.7* 11.5* 10.5*  HCT 39.1 36.9 34.2*  PLT 240 PLATELET CLUMPS NOTED ON SMEAR, COUNT APPEARS ADEQUATE 241   Coag's  Recent Labs Lab 07/11/14 2333  INR 1.10   BMET  Recent Labs Lab 07/15/14 0413 07/16/14 0400  07/17/14 0415  NA 139 137 139  K 4.3 3.0* 3.4*  CL 102 94* 95*  CO2 28 31 32  BUN 37* 46* 63*  CREATININE 1.70* 1.56* 1.97*  GLUCOSE 114* 102* 86   Electrolytes  Recent Labs Lab 07/14/14 0355 07/15/14 0413 07/16/14 0400 07/17/14 0415  CALCIUM 8.4 8.9 8.7 9.0  MG 2.0  --   --   --    Sepsis Markers  Recent Labs Lab 07/16/14 0400 07/17/14 0405  PROCALCITON 0.59 0.67   ABG  Recent Labs Lab 07/15/14 1003  PHART 7.420  PCO2ART 42.2  PO2ART 58.7*   Liver Enzymes  Recent Labs Lab 07/12/14 0320  AST 22  ALT 13  ALKPHOS 83  BILITOT 0.8  ALBUMIN 3.6   Cardiac Enzymes  Recent Labs Lab 07/12/14 0320 07/12/14 0914 07/12/14 1508  TROPONINI 0.11* 0.12* 0.11*   Glucose No results for input(s): GLUCAP in the last 168 hours.  Imaging  07/15/2014 chest x-ray> diffuse hazy opacities bilaterally with perhaps a more focal right upper lobe area of airspace disease, likely bilateral pleural effusions November 2014 CT chest images reviewed> bilateral pleural effusions are mild to moderate in size, very mild interstitial changes in the periphery of the lung worse in the bases likely consistent with volume overload, no clear interstitial lung disease.  ASSESSMENT / PLAN:  Principal Problem:   Acute on chronic respiratory failure with hypoxia Active Problems:   Atrial fibrillation   Elevated troponin   Essential hypertension   Chronic diastolic heart failure   Chronic kidney disease, stage  III (moderate)   Femur fracture, left   Closed left hip fracture   Hypokalemia   Pulmonary hypertension   Acute on chronic diastolic heart failure   Intertrochanteric fracture of left femur   Dyspnea  Discussion> Oxygenation continues to improve and she is at her baseline pulmonary status.  It should be noted that her oxygenation is poor at baseline (3LNC minimum, O2 saturation runs between 88-92% on good days).    Recommendation: Would proceed with surgery with the  understanding that her oxygen needs will be higher than normal parameters. Agree with lasix and metolazone po today Monitor BMET, UOP Push incentive spirometry We will be happy to assist if she remains on the vent post operatively.  May need to extubate to high flow oxygen.  FAMILY  - Updates: updated daughter Dawn by phone 3/9   Heber [place], MD [facility] Pager: [phone] Cell: [phone] If no response, call [phone]  07/17/2014, 10:47 AM

## 2014-07-17 NOTE — Op Note (Addendum)
OPERATIVE REPORT   SURGEON: Samson Frederic, MD.  ASSISTANT: Leilani Able, PA-C.  PREOPERATIVE DIAGNOSIS: Left pertrochanteric femur fracture.  POSTOPERATIVE DIAGNOSIS: Left pertrochanteric femur fracture.  PROCEDURE: Cephalomedullary nail placement, left pertrochanteric femur fracture.  ANESTHESIA: General.  IMPLANTS: 1. Stryker gamma 3 nail, 11 x 360 mm x 125 degrees. 2. 10.5 x 90 mm titanium lag screw. 3. 5.0 mm distal interlocking screw x1.  ESTIMATED BLOOD LOSS: 50 mL.  DRAINS: None.  SPECIMENS: None.  COMPLICATIONS: None.  DISPOSITION: Stable to PACU.  ANTIBIOTICS: Clindamycin 600 mg.  INDICATIONS: The patient is a 81 year old female who tripped and had a ground level fall 6 days ago. She had hip pain and inability to bear weight. She was brought to the emergency department at [facility] where x-rays revealed a pertrochanteric femur fracture on the day of injury. She was admitted to the Hospitalist Service. Surgery was indicated due to pain and inability to mobilize. Due to  cardiac and pulmonary comorbidities, she was evaluated by  cardiology and pulmonary critical care.  She required several days of diuresis to improve her pulmonary function in preparation of surgery. She underwent perioperative risk stratification, medical optimization. Risks, benefits, alternatives of the above mentioned procedure were explained. They family understands that she is very high risk. They elected to proceed.  DESCRIPTION OF PROCEDURE: The patient was correctly identified in the preop holding area using 2 patient identifiers. History, physical and consent were reviewed and found to be appropriate. Surgical site was marked by myself. The patient was taken to the operating room and general anesthesia was induced on her bed. She was then transferred to the [facility] table. Reduction was  performed with traction, adduction, and internal  rotation. The hip was prepped and draped in the normal sterile surgical fashion. Time-out was called verifying site of surgery. She did receive IV antibiotics within 60 minutes of beginning the procedure. I began by using fluoroscopy to define her anatomy. A 4 cm incision was made proximal to tip of the greater trochanter. Under fluoroscopic guidance, I inserted a guidepin at the standard entry site for a trochanteric nail. Entry reamer was then used. I then placed the guide wire and the position was confirmed fluoroscopically down to the physeal scar in the knee. I then measured the length of the nail and then sequentially reamed up to a 13 mm reamer. The nail was then opened and placed over the guide wire. Through a separate stab incision, cannula was placed for the lag screw. The pin was measured, drilled, and then the screw was placed under fluoroscopic guidance. I compressed  the fracture through the jig. Live fluoroscopy was used to confirm tip apex distance  and determine that there was no subchondral penetration. The jig was then removed.  I then used the perfect circle technique to place one distal interlocking screw. Final fluoroscopy views were obtained to confirm fracture Reduction, which was near anatomic, and hardware placement. The wounds  were then copiously irrigated with saline. The wounds were closed in layers with #1 Vicryl for the fascia, 2-0 Monocryl for the deep dermal layer, and 3-0 running Monocryl subcuticular stitch. Glue was applied to the skin. Once the glue was dried, sterile dressings were applied. Sponge, needle, and instrument counts were correct at the end of the case x2. The patient was transferred to the bed and extubated. She was taken to the PACU in stable condition. There were no known complications.  Please note that a surgical assistant was a medical necessity  for this procedure  to perform it in a safe and expeditious manner. Assistant was necessary for positioning, implant insertion, and wound closure.

## 2014-07-17 NOTE — Addendum Note (Signed)
Addendum  created 07/17/14 2009 by Illene SilverJanet E Jozlynn Plaia, CRNA   Modules edited: Anesthesia Events, Anesthesia Responsible Staff, Narrator   Narrator:  Narrator: Event Log Edited

## 2014-07-17 NOTE — Progress Notes (Signed)
     SUBJECTIVE: Only c/o hip pain. No chest pain. Breathing at baseline.   BP 105/52 mmHg  Pulse 73  Temp(Src) 98.3 F (36.8 C) (Oral)  Resp 11  Ht $RemoveBefor [id number]$5\' 2"/m2  SpO2 94%  Intake/Output Summary (Last 24 hours) at 07/17/14 [id number] Last data filed at 07/17/14 0300  Gross per 24 hour  Intake   1440 ml  Output   1640 ml  Net   -200 ml    PHYSICAL EXAM General: Thin elderly female. Alert and oriented x 3.  Psych:  Good affect, responds appropriately Neck: No JVD. No masses noted.  Lungs: Clear bilaterally with no wheezes or rhonci noted.  Heart: Irreg with no murmurs noted. Abdomen: Bowel sounds are present. Soft, non-tender.  Extremities: No lower extremity edema.   LABS: Basic Metabolic Panel:  Recent Labs  96/08/5401/08/16 0413 07/16/14 0400  NA 139 137  K 4.3 3.0*  CL 102 94*  CO2 28 31  GLUCOSE 114* 102*  BUN 37* 46*  CREATININE 1.70* 1.56*  CALCIUM 8.9 8.7   CBC:  Recent Labs  07/16/14 0400 07/17/14 0415  WBC 12.8* 14.2*  NEUTROABS 9.7*  --   HGB 11.5* 10.5*  HCT 36.9 34.2*  MCV 92.0 92.2  PLT PLATELET CLUMPS NOTED ON SMEAR, COUNT APPEARS ADEQUATE 241   Current Meds: . antiseptic oral rinse  7 mL Mouth Rinse BID  . atenolol  25 mg Oral BID  . buPROPion  300 mg Oral Daily  . docusate sodium  100 mg Oral BID  . furosemide  80 mg Intravenous BID  . levothyroxine  88 mcg Oral QAC breakfast  . metolazone  5 mg Oral Daily  . pantoprazole  40 mg Oral QHS   ASSESSMENT AND PLAN:  1. Hip fracture: Planning for hip repair per ortho when pulmonary status is optimized.   2. Acute on chronic diastolic CHF: She is on IV Lasix and daily metolazone and has had good diuresis. (-4.8 liters since admission). Renal function is stable. She appears to be at baseline volume status and does not appear volume overloaded. Oxygenation has improved with diuresis. Baseline is on home O2 with O2 sats 88-90%. Pulmonary status may be  optimized. Final recs today per pulmonary but looks like she can go to the OR today. Stop IV Lasix and change to po Lasix.   3. Elevated troponin: Felt to be demand ischemia. No ischemic evaluation is planned.   4. Chronic atrial fibrillation: Not on long term anti-coagulation due to prior bleeding on coumadin. Rate is controlled on atenolol.   5. Acute on Chronic respiratory failure: See above. Pulmonary following. She is known to have chronic respiratory failure and is on supplemental home O2 at home with history of amiodarone induced lung toxicity. Pulmonary status seems to be optimized.   MCALHANY,CHRISTOPHER  3/10/20166:29 AM

## 2014-07-17 NOTE — Addendum Note (Signed)
Addendum  created 07/17/14 2202 by Phillips GroutPeter Joselynn Amoroso, MD   Modules edited: Anesthesia Attestations, Anesthesia Blocks and Procedures, Anesthesia LDA, Anesthesia Medication Administration, Clinical Notes, Lines/Drains/Airways Properties Editor, Orders, PRL Based Order Sets   Clinical Notes:  File: [id number]   Lines/Drains/Airways Properties Editor:  Properties of line/drain/airway/wound Arterial Line Left Radial have been modified.

## 2014-07-18 ENCOUNTER — Inpatient Hospital Stay (HOSPITAL_COMMUNITY): Payer: MEDICARE

## 2014-07-18 ENCOUNTER — Encounter (HOSPITAL_COMMUNITY): Payer: Self-pay | Admitting: Orthopedic Surgery

## 2014-07-18 DIAGNOSIS — I480 Paroxysmal atrial fibrillation: Secondary | ICD-10-CM

## 2014-07-18 LAB — CBC
HCT: 36.5 % (ref 36.0–46.0)
HEMOGLOBIN: 11.3 g/dL — AB (ref 12.0–15.0)
MCH: 28.3 pg (ref 26.0–34.0)
MCHC: 31 g/dL (ref 30.0–36.0)
MCV: 91.3 fL (ref 78.0–100.0)
Platelets: 289 10*3/uL (ref 150–400)
RBC: 4 MIL/uL (ref 3.87–5.11)
RDW: 16.8 % — AB (ref 11.5–15.5)
WBC: 17.5 10*3/uL — ABNORMAL HIGH (ref 4.0–10.5)

## 2014-07-18 LAB — BASIC METABOLIC PANEL
Anion gap: 14 (ref 5–15)
Anion gap: 14 (ref 5–15)
BUN: 70 mg/dL — AB (ref 6–23)
BUN: 81 mg/dL — ABNORMAL HIGH (ref 6–23)
CHLORIDE: 99 mmol/L (ref 96–112)
CO2: 33 mmol/L — ABNORMAL HIGH (ref 19–32)
CO2: 29 mmol/L (ref 19–32)
CREATININE: 2.05 mg/dL — AB (ref 0.50–1.10)
Calcium: 8.4 mg/dL (ref 8.4–10.5)
Calcium: 8.2 mg/dL — ABNORMAL LOW (ref 8.4–10.5)
Chloride: 94 mmol/L — ABNORMAL LOW (ref 96–112)
Creatinine, Ser: 1.69 mg/dL — ABNORMAL HIGH (ref 0.50–1.10)
GFR calc Af Amer: 29 mL/min — ABNORMAL LOW (ref >90)
GFR, EST AFRICAN AMERICAN: 23 mL/min — AB (ref >90)
GFR, EST NON AFRICAN AMERICAN: 25 mL/min — AB (ref >90)
GFR, EST NON AFRICAN AMERICAN: 20 mL/min — AB (ref >90)
GLUCOSE: 99 mg/dL (ref 70–99)
Glucose, Bld: 130 mg/dL — ABNORMAL HIGH (ref 70–99)
POTASSIUM: 2.7 mmol/L — AB (ref 3.5–5.1)
POTASSIUM: 3.2 mmol/L — AB (ref 3.5–5.1)
Sodium: 143 mmol/L (ref 135–145)
Sodium: 137 mmol/L (ref 135–145)

## 2014-07-18 LAB — PROCALCITONIN: Procalcitonin: 0.71 ng/mL

## 2014-07-18 MED ORDER — MENTHOL 3 MG MT LOZG: 1.0000 | LOZENGE | OROMUCOSAL | Status: DC | PRN

## 2014-07-18 MED ORDER — SENNA 8.6 MG PO TABS
1.0000 | ORAL_TABLET | Freq: Two times a day (BID) | ORAL | Status: DC
  Administered 2014-07-18 – 2014-07-20 (×4): 8.6 mg via ORAL
  Filled 2014-07-18 (×5): qty 1

## 2014-07-18 MED ORDER — LACTATED RINGERS IV SOLN
INTRAVENOUS | Status: DC
  Administered 2014-07-18: 05:00:00 via INTRAVENOUS

## 2014-07-18 MED ORDER — TRAMADOL HCL 50 MG PO TABS
50.0000 mg | ORAL_TABLET | Freq: Four times a day (QID) | ORAL | Status: DC | PRN
  Administered 2014-07-18 – 2014-07-19 (×4): 50 mg via ORAL
  Filled 2014-07-18 (×5): qty 1

## 2014-07-18 MED ORDER — PHENOL 1.4 % MT LIQD
1.0000 | OROMUCOSAL | Status: DC | PRN
Start: 2014-07-18 — End: 2014-07-22

## 2014-07-18 MED ORDER — ONDANSETRON HCL 4 MG/2ML IJ SOLN: 4.0000 mg | Freq: Four times a day (QID) | INTRAMUSCULAR | Status: DC | PRN

## 2014-07-18 MED ORDER — FUROSEMIDE 40 MG PO TABS
80.0000 mg | ORAL_TABLET | Freq: Every day | ORAL | Status: DC
  Administered 2014-07-18 – 2014-07-22 (×5): 80 mg via ORAL
  Filled 2014-07-18 (×2): qty 2
  Filled 2014-07-18: qty 4
  Filled 2014-07-18 (×3): qty 2

## 2014-07-18 MED ORDER — ACETAMINOPHEN 650 MG RE SUPP
650.0000 mg | Freq: Three times a day (TID) | RECTAL | Status: DC
  Filled 2014-07-18 (×5): qty 1

## 2014-07-18 MED ORDER — POTASSIUM CHLORIDE 10 MEQ/100ML IV SOLN
INTRAVENOUS | Status: AC
  Administered 2014-07-18: 10 meq via INTRAVENOUS
  Filled 2014-07-18: qty 100

## 2014-07-18 MED ORDER — ASPIRIN EC 325 MG PO TBEC
325.0000 mg | DELAYED_RELEASE_TABLET | Freq: Every day | ORAL | Status: DC
  Administered 2014-07-18 – 2014-07-22 (×5): 325 mg via ORAL
  Filled 2014-07-18 (×5): qty 1

## 2014-07-18 MED ORDER — ACETAMINOPHEN 500 MG PO TABS
1000.0000 mg | ORAL_TABLET | Freq: Three times a day (TID) | ORAL | Status: DC
  Administered 2014-07-18 – 2014-07-22 (×14): 1000 mg via ORAL
  Filled 2014-07-18 (×16): qty 2

## 2014-07-18 MED ORDER — METOCLOPRAMIDE HCL 5 MG/ML IJ SOLN: 5.0000 mg | Freq: Three times a day (TID) | INTRAMUSCULAR | Status: DC | PRN

## 2014-07-18 MED ORDER — SORBITOL 70 % SOLN
960.0000 mL | TOPICAL_OIL | Freq: Once | ORAL | Status: DC
  Filled 2014-07-18: qty 240

## 2014-07-18 MED ORDER — METOCLOPRAMIDE HCL 5 MG PO TABS
5.0000 mg | ORAL_TABLET | Freq: Three times a day (TID) | ORAL | Status: DC | PRN
  Filled 2014-07-18: qty 2

## 2014-07-18 MED ORDER — MORPHINE SULFATE 2 MG/ML IJ SOLN
2.0000 mg | Freq: Once | INTRAMUSCULAR | Status: AC
Start: 2014-07-18 — End: 2014-07-18
  Administered 2014-07-18: 2 mg via INTRAVENOUS

## 2014-07-18 MED ORDER — MORPHINE SULFATE 2 MG/ML IJ SOLN
INTRAMUSCULAR | Status: AC
  Filled 2014-07-18: qty 1

## 2014-07-18 MED ORDER — POTASSIUM CHLORIDE CRYS ER 20 MEQ PO TBCR
40.0000 meq | EXTENDED_RELEASE_TABLET | ORAL | Status: AC
  Administered 2014-07-18 (×2): 40 meq via ORAL
  Filled 2014-07-18 (×2): qty 2

## 2014-07-18 MED ORDER — POTASSIUM CHLORIDE 10 MEQ/100ML IV SOLN
10.0000 meq | INTRAVENOUS | Status: AC
  Administered 2014-07-18 (×4): 10 meq via INTRAVENOUS
  Filled 2014-07-18 (×3): qty 100

## 2014-07-18 MED ORDER — ONDANSETRON HCL 4 MG PO TABS: 4.0000 mg | ORAL_TABLET | Freq: Four times a day (QID) | ORAL | Status: DC | PRN

## 2014-07-18 MED ORDER — CLINDAMYCIN PHOSPHATE 600 MG/50ML IV SOLN
600.0000 mg | Freq: Four times a day (QID) | INTRAVENOUS | Status: AC
  Administered 2014-07-18 (×2): 600 mg via INTRAVENOUS
  Filled 2014-07-18 (×2): qty 50

## 2014-07-18 NOTE — Progress Notes (Addendum)
SUBJECTIVE: Pt uncomfortable. States that breathing is ok.  BP 116/58 mmHg  Pulse 89  Temp(Src) 98.2 F (36.8 C) (Axillary)  Resp 15  Ht  (1.575 m)  Wt 136 lb 11 oz (62 kg)  BMI 24.99 kg/m2  SpO2 100%  Intake/Output Summary (Last 24 hours) at 07/18/14 0649 Last data filed at 07/18/14 0600  Gross per 24 hour  Intake    700 ml  Output   2780 ml  Net  -2080 ml    PHYSICAL EXAM General: Elderly female appears uncomfortable. Alert and oriented x 3.  Psych:  Good affect, responds appropriately Neck: No JVD. No masses noted.  Lungs: Clear bilaterally with no wheezes or rhonci noted.  Heart: Irregular  Abdomen: Bowel sounds are present. Soft, non-tender.  Extremities: No lower extremity edema.   LABS: Basic Metabolic Panel:  Recent Labs  29/56/21 0415 07/18/14 0430  NA 139 143  K 3.4* 2.7*  CL 95* 99  CO2 32 33*  GLUCOSE 86 99  BUN 63* 70*  CREATININE 1.97* 1.69*  CALCIUM 9.0 8.4   CBC:  Recent Labs  07/16/14 0400 07/17/14 0415 07/18/14 0430  WBC 12.8* 14.2* 17.5*  NEUTROABS 9.7*  --   --   HGB 11.5* 10.5* 11.3*  HCT 36.9 34.2* 36.5  MCV 92.0 92.2 91.3  PLT PLATELET CLUMPS NOTED ON SMEAR, COUNT APPEARS ADEQUATE 241 289   Current Meds: . acetaminophen  1,000 mg Oral 3 times per day   Or  . acetaminophen  650 mg Rectal 3 times per day  . antiseptic oral rinse  7 mL Mouth Rinse BID  . aspirin EC  325 mg Oral Q breakfast  . atenolol  25 mg Oral BID  . buPROPion  300 mg Oral Daily  . clindamycin (CLEOCIN) IV  600 mg Intravenous Q6H  . docusate sodium  100 mg Oral BID  . furosemide  80 mg Intravenous BID  . levothyroxine  88 mcg Oral QAC breakfast  . metolazone  5 mg Oral Daily  . pantoprazole  40 mg Oral QHS  . 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 Hospitalization  prolonged by acute respiratory failure requiring high levels of supplemental O2. This was felt to be due to volume overload on top of poor baseline lung function.   1. Hip fracture: s/p repair left femur fracture 07/17/14.    2. Acute on chronic diastolic CHF: She is on IV Lasix and daily metolazone and has had good diuresis. (-6.8 liters since admission). Renal function is stable. She appears to be at baseline volume status and does not appear volume overloaded. Baseline is on home O2 with O2 sats 88-90%.  -Would stop IV Lasix and resume po Lasix. Should be able to use metolazone prn.   3. Elevated troponin: Felt to be demand ischemia. No ischemic evaluation is planned.   4. Chronic atrial fibrillation: Not on long term anti-coagulation due to prior bleeding on coumadin. Rate is controlled on atenolol.   5. Acute on Chronic respiratory failure: See above. Pulmonary following. She is known to have chronic respiratory failure and is on supplemental home O2 at home with history of amiodarone induced lung toxicity. Pulmonary status is stable this am post hip repair but still requiring high levels of supplemental O2.   6. Hypokalemia: Replace this  am. Will give 4 runs of potassium for K 2.7. Repeat BMET at noon.   [name]  3/11/20166:49 AM

## 2014-07-18 NOTE — Progress Notes (Addendum)
Pt found to have fecal impaction earlier today, impaction removed manually. Attempted to give SMOG enema after disimpaction, but was unable to advance enema d/t resistance and pt was unable to retain any of the fluid. Pt was asked to bear down and I was then able to remove more impacted stool, but still could not advance enema. Dr. Janee Mornhompson notified and [employer] enema was given per order at 1800 using large foley catheter with 30cc balloon. Pt retained enema for 30 minutes. Pt states she has urge to have bowel movement, but has not yet been able to do so. Pt currently on bedpan attempting to have bowel movement. Will continue to monitor.   Update @ 1957: Pt passed small hard rectal stool.   Sheryle HailGrigg,Spruha Weight J, RN

## 2014-07-18 NOTE — Anesthesia Postprocedure Evaluation (Signed)
  Anesthesia Post-op Note  Patient: [name]  Procedure(s) Performed: Procedure(s) (LRB): INTRAMEDULLARY (IM) NAIL FEMORAL (Left)  Patient Location: PACU  Anesthesia Type: Spinal  Level of Consciousness: awake and alert   Airway and Oxygen Therapy: Patient Spontanous Breathing  Post-op Pain: mild  Post-op Assessment: Post-op Vital signs reviewed, Patient's Cardiovascular Status Stable, Respiratory Function Stable, Patent Airway and No signs of Nausea or vomiting  Last Vitals:  Filed Vitals:   07/18/14 0600  BP:   Pulse: 89  Temp:   Resp: 15    Post-op Vital Signs: stable   Complications: No apparent anesthesia complications

## 2014-07-18 NOTE — Progress Notes (Signed)
   Subjective:  Patient reports pain as mild.  Currently on NRB.  Objective:   VITALS:   Filed Vitals:   07/18/14 0500 07/18/14 0515 07/18/14 0600 07/18/14 0700  BP:      Pulse: 86 76 89   Temp:    97.8 F (36.6 C)  TempSrc:    Axillary  Resp: 30 23 15    Height:      Weight: 62 kg (136 lb 11 oz)     SpO2: 95% 97% 100%     ABD soft Intact pulses distally Dorsiflexion/Plantar flexion intact Incision: dressing C/D/I   Lab Results  Component Value Date   WBC 17.5* 07/18/2014   HGB 11.3* 07/18/2014   HCT 36.5 07/18/2014   MCV 91.3 07/18/2014   PLT 289 07/18/2014   BMET    Component Value Date/Time   NA 143 07/18/2014 0430   NA 137 04/10/2013 1527   K 2.7* 07/18/2014 0430   CL 99 07/18/2014 0430   CO2 33* 07/18/2014 0430   GLUCOSE 99 07/18/2014 0430   GLUCOSE 94 04/10/2013 1527   BUN 70* 07/18/2014 0430   BUN 28 04/10/2013 1527   CREATININE 1.69* 07/18/2014 0430   CREATININE 1.65* 08/06/2013 1508   CALCIUM 8.4 07/18/2014 0430   GFRNONAA 25* 07/18/2014 0430   GFRAA 29* 07/18/2014 0430     Assessment/Plan: 1 Day Post-Op   Principal Problem:   Acute on chronic respiratory failure with hypoxia Active Problems:   Atrial fibrillation   Elevated troponin   Essential hypertension   Chronic diastolic heart failure   Chronic kidney disease, stage III (moderate)   Femur fracture, left   Closed left hip fracture   Hypokalemia   Pulmonary hypertension   Acute on chronic diastolic heart failure   Intertrochanteric fracture of left femur   Dyspnea  WBAT with walker PT/OT PO pain control with scheduled tylenol and PRN tramadol, avoid narcotics DVT ppx: high risk for bleeding, ASA 325 mg daily, SCDs, TEDs Advance diet Up with therapy Discharge to SNF   Garnie Borchardt, Cloyde ReamsBrian James 07/18/2014, 7:39 AM   Samson FredericBrian Winston Misner, MD Cell [phone]

## 2014-07-18 NOTE — Addendum Note (Signed)
Addendum  created 07/18/14 0704 by Phillips GroutPeter Sonam Huelsmann, MD   Modules edited: Anesthesia Attestations, Notes Section   Notes Section:  File: [id number]

## 2014-07-18 NOTE — Progress Notes (Signed)
CARE MANAGEMENT NOTE 07/18/2014  Patient:  [name],Trenise B   Account Number:  [id number]  Date Initiated:  07/15/2014  Documentation initiated by:  DAVIS,RHONDA  Subjective/Objective Assessment:   81 year old female admitted on 07/12/2014 for a left hip fracture, pulmonary and crit of care medicine consultation for ongoing hypoxemic respiratory failure on 07/15/2014.     Action/Plan:   snf when stable   Anticipated DC Date:  07/21/2014   Anticipated DC Plan:  SKILLED NURSING FACILITY  In-house referral  Clinical Social Worker      DC Planning Services  CM consult      [facility]  NA   Choice offered to / List presented to:  NA   DME arranged  NA      DME agency  NA     HH arranged  NA      HH agency  NA   Status of service:  In process, will continue to follow Medicare Important Message given?   (If response is "NO", the following Medicare IM given date fields will be blank) Date Medicare IM given:   Medicare IM given by:   Date Additional Medicare IM given:   Additional Medicare IM given by:    Discharge Disposition:    Per UR Regulation:  Reviewed for med. necessity/level of care/duration of stay  If discussed at Long Length of Stay Meetings, dates discussed:    Comments:  July 18, 2014/Rhonda L. Earlene Plateravis, RN, BSN, CCM. Case Management [facility] [phone] No discharge needs present of time of review. continues to require high level of 02 on nrb face mask  July 15, 2014/Rhonda L. Earlene Plateravis, RN, BSN, CCM. Case Management [facility] [phone] No discharge needs present of time of review.

## 2014-07-18 NOTE — Progress Notes (Signed)
PULMONARY / CRITICAL CARE MEDICINE   Name: [name] MRN: [id number] DOB: 1919/06/20    ADMISSION DATE:  07/11/2014 CONSULTATION DATE:  07/15/14  REFERRING MD :  Dr. Janee Morn [facility]  CHIEF COMPLAINT:  Shortness of breath, left hip fracture  INITIAL PRESENTATION: 81 year old female admitted on 07/12/2014 for a left hip fracture, pulmonary and crit of care medicine consultation for ongoing hypoxemic respiratory failure on 07/15/2014.  STUDIES:  07/12/2014 transthoracic echocardiogram> LVEF 55-60%, severe basal hypertrophy and moderate concentric hypertrophy, Doppler parameters consistent with high v entricular filling pressure, ventricular septum is consistent with RV volume overload, RVSP 55 mmHg, right atrium is moderately dilated, pacer wire noted, right ventricle systolic function mildly reduced.  SIGNIFICANT EVENTS:   SUBJECTIVE: Did well w/ surgery   VITAL SIGNS: Temp:  [97.8 F (36.6 C)-98.6 F (37 C)] 97.8 F (36.6 C) (03/11 0700) Pulse Rate:  [37-94] 90 (03/11 1000) Resp:  [11-30] 24 (03/11 1000) BP: (95-137)/(47-74) 95/47 mmHg (03/11 1000) SpO2:  [87 %-100 %] 91 % (03/11 1000) Arterial Line BP: (82-145)/(47-71) 97/49 mmHg (03/11 1000) FiO2 (%):  [55 %] 55 % (03/11 0800) Weight:  [62 kg (136 lb 11 oz)] 62 kg (136 lb 11 oz) (03/11 0500) HEMODYNAMICS:   VENTILATOR SETTINGS: Vent Mode:  [-]  FiO2 (%):  [55 %] 55 % INTAKE / OUTPUT:  Intake/Output Summary (Last 24 hours) at 07/18/14 1145 Last data filed at 07/18/14 1027  Gross per 24 hour  Intake    900 ml  Output   3455 ml  Net  -2555 ml    PHYSICAL EXAMINATION: General:  Chronically ill appearing, nasal canula HEENT: NCAT, [place] in place PULM: Few crackles in bases, no accessory muscle use   LABS:  CBC  Recent Labs Lab 07/16/14 0400 07/17/14 0415 07/18/14 0430  WBC 12.8* 14.2* 17.5*  HGB 11.5* 10.5* 11.3*  HCT 36.9 34.2* 36.5  PLT PLATELET CLUMPS NOTED ON SMEAR, COUNT APPEARS  ADEQUATE 241 289   Coag's  Recent Labs Lab 07/11/14 2333  INR 1.10   BMET  Recent Labs Lab 07/16/14 0400 07/17/14 0415 07/18/14 0430  NA 137 139 143  K 3.0* 3.4* 2.7*  CL 94* 95* 99  CO2 31 32 33*  BUN 46* 63* 70*  CREATININE 1.56* 1.97* 1.69*  GLUCOSE 102* 86 99   Electrolytes  Recent Labs Lab 07/14/14 0355  07/16/14 0400 07/17/14 0415 07/18/14 0430  CALCIUM 8.4  < > 8.7 9.0 8.4  MG 2.0  --   --   --   --   < > = values in this interval not displayed. Sepsis Markers  Recent Labs Lab 07/16/14 0400 07/17/14 0405 07/18/14 0415  PROCALCITON 0.59 0.67 0.71   ABG  Recent Labs Lab 07/15/14 1003  PHART 7.420  PCO2ART 42.2  PO2ART 58.7*   Liver Enzymes  Recent Labs Lab 07/12/14 0320  AST 22  ALT 13  ALKPHOS 83  BILITOT 0.8  ALBUMIN 3.6   Cardiac Enzymes  Recent Labs Lab 07/12/14 0320 07/12/14 0914 07/12/14 1508  TROPONINI 0.11* 0.12* 0.11*   Glucose No results for input(s): GLUCAP in the last 168 hours.  Imaging  07/15/2014 chest x-ray> diffuse hazy opacities bilaterally with perhaps a more focal right upper lobe area of airspace disease, likely bilateral pleural effusions November 2014 CT chest images reviewed> bilateral pleural effusions are mild to moderate in size, very mild interstitial changes in the periphery of the lung worse in the bases likely consistent with volume  overload, no clear interstitial lung disease.  ASSESSMENT / PLAN:  Principal Problem:   Acute on chronic respiratory failure with hypoxia Active Problems:   Atrial fibrillation   Elevated troponin   Essential hypertension   Chronic diastolic heart failure   Chronic kidney disease, stage III (moderate)   Femur fracture, left   Closed left hip fracture   Hypokalemia   Pulmonary hypertension   Acute on chronic diastolic heart failure   Intertrochanteric fracture of left femur   Dyspnea  Discussion> Did well w/ surgery/  Oxygenation stable and close to  baseline pulmonary status. Suspect most of her oxygenation issues have been d/t volume overload and pulmonary edema as she has responded well to diuresis. It should be noted that her oxygenation is poor at baseline (3LNC minimum, O2 saturation runs between 88-92% on good days).  Looks like we can cont   Recommendation:  cont with lasix and metolazone po today Monitor BMET, UOP Close obs of K w/ metolazone and lasix combo. Have written for f/u chemistry today  Push incentive spirometry We will be available PRN   FAMILY  - Updates: updated daughter Dawn by phone 3/11    07/18/2014, 11:45 AM   PCCM ATTENDING: I have reviewed pt's initial presentation, consultants notes and hospital database in detail.  The above assessment and plan was formulated under my direction.  In summary: Post op pulm edema. Improving with diuresis with improving O2 reqts. Diuretic induced hypokalemia Recommend continued diuretic therapy and mgmt of CHF per Cards Please monitor K+ closely while on aggressive diuretic therapy She is on the primary service of TRH and Cardiology is following. Her risk of intubation has seemingly passed. Therefore, PCCM will sign off. Please call if we can be of further assistance  Billy Fischeravid Manuella Blackson, MD;  PCCM service; Mobile [phone]

## 2014-07-18 NOTE — Progress Notes (Signed)
[facility] PROGRESS NOTE  [name] JXB:[id number] DOB: 1919-07-13 DOA: 07/11/2014 PCP: Carollee Herter, MD  Brief narrative:    81 y.o. female with HTN, atrial fibrillation but not on AC due to bleeding (was on Coumadin in 2005 but that was stopped), CKD stage IV, diastolic CHF, on oxygen at home, per family member also unknown lung injury from Amiodarone (pt was on for 8 years), PM [employer] (DDD with atrial undersensing) COPD, severe pulm HTN, presented to [facility] ED after an episode of fall and has subsequently sustained left hip fracture. Due to multiple co morbid conditions, cariology for consulted for pre op clearance prior to surgery.   Events since admission: 3/4 - admitted to telemetry unit, trop elevated 0.11 3/5 - persistently hypoxic with O2 sat in 80's on 6 L [place] and up to low 90's with venti mask, transferred to SDU       Assessment/Plan: #1 acute on chronic respiratory failure Likely multifactorial secondary to acute on chronic diastolic heart failure in the setting of atrial fibrillation, chronic COPD on home O2, pulmonary hypertension. Patient on NRB post surgery. Chest x-ray was consistent with volume overload. Patient is -7.55L since admission 07/12/14. Current weight at  136 lbs from 134lb from 137 pounds from 138 pounds. Troponins were elevated however felt to be secondary to demand ischemia. 2-D echo with EF of 55%. IV Lasix have been changed to oral Lasix per cardiology. Continue atenolol,Zaroxyln. Monitor oxygen requirements. Cardiology following and appreciate input and recommendations. PCCM ff and appreciate input and rxcs.   #2 acute on chronic diastolic heart failure Patient with elevated troponins felt to be secondary to a demand ischemia. 2-D echo with EF of 55%. Patient with good diuresis and is -7.55 L since admission 07/12/14. Current weight 136 lbs from 134lbs from 137 LBs from 138 pounds. IV Lasix has been changed to oral Lasix per cardiology. Continue  beta blocker and Zaroxyln . Cardiology following and appreciate input and recommendations.  #3 left intertrochanteric hip fracture Patient is being seen by orthopedics. Patient s/p Nail.  Patient currently on NRB. Lasix has been changed to PO per cardiology. PT/OT. Ortho ff.   #4 hypothyroidism Continue home dose Synthroid.  #5 atrial fibrillation Continue atenolol for rate control. Patient not deemed a long-term anticoagulation candidate secondary to prior bleeding on Coumadin. Per cardiology.  #6 hypokalemia Likely secondary to diuresis. Replete.  #7 elevated troponin Felt to be secondary to demand ischemia. Per cardiology no ischemic workup planned at this time.  #8 COPD Stable. Nebs as needed.  #9 hypertension Stable. On atenolol. Monitor on Lasix.  #10 chronic kidney disease stage IV Currently stable. Baseline creatinine is 2.1-2.3. Monitor closely with diuresis. Renal function actually has improved.   #11 severe protein calorie malnutrition Continue current diet. Follow.  #12 prophylaxis SCDs for DVT prophylaxis.  Code Status: Full Family Communication: Updated patient and care giver at bedside. Disposition Plan: Remain in the step down.   Consultants:  Orthopedics: Dr. supple 07/12/2014  Cardiology: Dr. Ace Gins 07/12/2014  PCCM Dr Kendrick Fries 07/15/14  Procedures:  Chest x-ray 07/11/2014, 07/13/2014  2-D echo 07/12/2014  X-ray of the left femur 07/12/2014  X-ray of the left hip with pelvis 07/11/2014  Cephalomedullary nail placement, left pertrochanteric femur 07/17/14  Antibiotics:  None  HPI/Subjective: Patient with complaints of left hip pain. Denies CP. Feels breathing is improving.  Objective: Filed Vitals:   07/18/14 0800  BP: 125/55  Pulse: 76  Temp:   Resp: 15  Intake/Output Summary (Last 24 hours) at 07/18/14 0903 Last data filed at 07/18/14 0804  Gross per 24 hour  Intake    800 ml  Output   3455 ml  Net  -2655 ml   Filed  Weights   07/16/14 0440 07/17/14 0800 07/18/14 0500  Weight: 62.2 kg (137 lb 2 oz) 60.8 kg (134 lb 0.6 oz) 62 kg (136 lb 11 oz)    Exam:   General:  On NRB  Cardiovascular: Irregularly irregular.  Respiratory: Bibasilar crackles. No wheezing.  Abdomen: Soft, nontender, nondistended, positive bowel sounds.  Musculoskeletal: No clubbing cyanosis or edema. Left lower extremity with TTP.  Data Reviewed: Basic Metabolic Panel:  Recent Labs Lab 07/14/14 0355 07/15/14 0413 07/16/14 0400 07/17/14 0415 07/18/14 0430  NA 138 139 137 139 143  K 3.2* 4.3 3.0* 3.4* 2.7*  CL 100 102 94* 95* 99  CO2 32 33*  GLUCOSE 114* 114* 102* 86 99  BUN 31* 37* 46* 63* 70*  CREATININE 1.77* 1.70* 1.56* 1.97* 1.69*  CALCIUM 8.4 8.9 8.7 9.0 8.4  MG 2.0  --   --   --   --    Liver Function Tests:  Recent Labs Lab 07/12/14 0320  AST 22  ALT 13  ALKPHOS 83  BILITOT 0.8  PROT 6.1  ALBUMIN 3.6   No results for input(s): LIPASE, AMYLASE in the last 168 hours. No results for input(s): AMMONIA in the last 168 hours. CBC:  Recent Labs Lab 07/11/14 2333 07/12/14 0320  07/14/14 0355 07/15/14 0413 07/16/14 0400 07/17/14 0415 07/18/14 0430  WBC 13.6* 12.9*  < > 15.3* 18.8* 12.8* 14.2* 17.5*  NEUTROABS 11.1* 9.5*  --   --   --  9.7*  --   --   HGB 11.9* 11.3*  < > 11.0* 11.7* 11.5* 10.5* 11.3*  HCT 37.2 36.1  < > 36.5 39.1 36.9 34.2* 36.5  MCV 91.9 91.6  < > 94.1 94.4 92.0 92.2 91.3  PLT 241 237  < > 197 240 PLATELET CLUMPS NOTED ON SMEAR, COUNT APPEARS ADEQUATE 241 289  < > = values in this interval not displayed. Cardiac Enzymes:  Recent Labs Lab 07/11/14 2328 07/12/14 0320 07/12/14 0914 07/12/14 1508  TROPONINI 0.11* 0.11* 0.12* 0.11*   BNP (last 3 results)  Recent Labs  07/11/14 2328 07/15/14 0413  BNP 299.6* 359.3*    ProBNP (last 3 results)  Recent Labs  07/25/13 1215  PROBNP 4206.0*    CBG: No results for input(s): GLUCAP in the last 168  hours.  Recent Results (from the past 240 hour(s))  Urine culture     Status: None   Collection Time: 07/12/14  2:25 AM  Result Value Ref Range Status   Specimen Description URINE, CATHETERIZED  Final   Special Requests Normal  Final   Colony Count NO GROWTH Performed at [facility]   Final   Culture NO GROWTH Performed at [facility]   Final   Report Status 07/13/2014 FINAL  Final  Surgical pcr screen     Status: None   Collection Time: 07/12/14 12:54 PM  Result Value Ref Range Status   MRSA, PCR NEGATIVE NEGATIVE Final   Staphylococcus aureus NEGATIVE NEGATIVE Final    Comment:        The Xpert SA Assay (FDA approved for NASAL specimens in patients over 61 years of age), is one component of a comprehensive surveillance program.  Test performance has been validated by [facility] for patients greater than or equal to 43 year old. It is not intended to diagnose infection nor to guide or monitor treatment.      Studies: Pelvis Portable  07/18/2014   CLINICAL DATA:  Post ORIF.  EXAM: PORTABLE PELVIS 1-2 VIEWS  COMPARISON:  Left femur radiographs 07/12/2014  FINDINGS: Intra medullary rod with locking screw traversing left intertrochanteric femur fracture. Alignment is improved. Recent postsurgical change includes air in the soft tissues. Right hip arthroplasty in place. Examination is limited by positioning.  IMPRESSION: Intra medullary rod with proximal locking screw traversing left intertrochanteric femur fracture. No immediate postoperative complication.   Electronically Signed   By: Rubye Oaks M.D.   On: 07/18/2014 06:02   Dg C-arm 1-60 Min-no Report  07/17/2014   CLINICAL DATA: surgery   C-ARM 1-60 MINUTES  Fluoroscopy was utilized by the requesting physician.  No radiographic  interpretation.    Dg Femur Min 2 Views Left  07/18/2014   CLINICAL DATA:  Post ORIF.  EXAM: LEFT FEMUR 2 VIEWS  COMPARISON:  07/12/2014  FINDINGS: Intra medullary rod with  proximal and distal locking screws traverse left hip intertrochanteric femur fracture. There is improved alignment. Displaced fragment involving the lesser trochanter is again seen. Recent postsurgical change includes air in the adjacent soft tissues.  IMPRESSION: Post ORIF left intertrochanteric femur fracture without immediate postoperative complication.   Electronically Signed   By: Rubye Oaks M.D.   On: 07/18/2014 06:04   Dg Femur Min 2 Views Left  07/17/2014   CLINICAL DATA:  Intertrochanteric left femur fracture fixation. Initial encounter.  EXAM: DG C-ARM 1-60 MIN - NRPT [facility]; LEFT FEMUR 2 VIEWS  COMPARISON:  Radiographs 07/12/2014.  FLUOROSCOPY TIME: C-arm fluoroscopic images were obtained intraoperatively and submitted for post operative interpretation. Please see the performing provider's procedural report for the fluoroscopy time utilized.  FINDINGS: Four spot fluoroscopic images of the left femur demonstrate intramedullary rod and dynamic screw fixation of the comminuted intertrochanteric femur fracture. There is a distal interlocking screw. The main fracture fragments demonstrate near anatomic alignment. No demonstrated complication.  IMPRESSION: ORIF of intertrochanteric left femur fracture. No demonstrated complication.   Electronically Signed   By: Carey Bullocks M.D.   On: 07/17/2014 22:27    Scheduled Meds: . acetaminophen  1,000 mg Oral 3 times per day   Or  . acetaminophen  650 mg Rectal 3 times per day  . antiseptic oral rinse  7 mL Mouth Rinse BID  . aspirin EC  325 mg Oral Q breakfast  . atenolol  25 mg Oral BID  . buPROPion  300 mg Oral Daily  . clindamycin (CLEOCIN) IV  600 mg Intravenous Q6H  . docusate sodium  100 mg Oral BID  . furosemide  80 mg Oral Daily  . levothyroxine  88 mcg Oral QAC breakfast  . metolazone  5 mg Oral Daily  . pantoprazole  40 mg Oral QHS  . potassium chloride  10 mEq Intravenous Q1 Hr x 4  . potassium chloride  40 mEq Oral Q4H  . senna   1 tablet Oral BID   Continuous Infusions: . lactated ringers 50 mL/hr at 07/18/14 0600    Principal Problem:   Acute on chronic respiratory failure with hypoxia Active Problems:   Closed left hip fracture   Acute on chronic diastolic heart failure   Atrial fibrillation   Elevated troponin   Essential hypertension   Chronic diastolic heart failure   Chronic kidney disease, stage  III (moderate)   Femur fracture, left   Hypokalemia   Pulmonary hypertension   Intertrochanteric fracture of left femur   Dyspnea    Time spent: 40 minutes    Moon Budde M.D. [facility] Pager [phone]. If 7PM-7AM, please contact night-coverage at [email or web address], password [email or web address] 07/18/2014, 9:03 AM  LOS: 6 days

## 2014-07-18 NOTE — Evaluation (Signed)
Physical Therapy Evaluation Patient Details Name: [name] MRN: [id number] DOB: [birth year] Today's Date: 07/18/2014   History of Present Illness  81 yo female s/p L IM nail femur 07/17/14. Hx of vertigo, HTN, Afib, osteoporosis, Aflutter, CHF, tachybrady syndrome, pacemaker, O2 dependent.   Clinical Impression  On eval, pt required Max-total assist +2 for mobility-able to sit EOB then stand for ~20 seconds with RW. All activity and movement of L LE is painful. Mod encouragement for participation. Will need SNF.     Follow Up Recommendations SNF;Supervision/Assistance - 24 hour    Equipment Recommendations  None recommended by PT    Recommendations for Other Services       Precautions / Restrictions Precautions Precautions: Fall Restrictions Weight Bearing Restrictions: No LLE Weight Bearing: Weight bearing as tolerated      Mobility  Bed Mobility Overal bed mobility: Needs Assistance Bed Mobility: Supine to Sit;Sit to Supine     Supine to sit: Max assist;+2 for physical assistance;+2 for safety/equipment;HOB elevated Sit to supine: Total assist;+2 for physical assistance;+2 for safety/equipment   General bed mobility comments: Assist for trunk and bil LEs. Pt grabs at L LE/grimaces/ yells out with all movement. Utilized bedpad for scooting, positioning. Sat EOB ~7-8 minutes prior to attempting standing.   Transfers Overall transfer level: Needs assistance Equipment used: Rolling walker (2 wheeled) Transfers: Sit to/from Stand Sit to Stand: Max assist;+2 physical assistance;+2 safety/equipment         General transfer comment: Assist to rise, stabilize, control descent. Pt agreeable to stand for hygiene (had BM). Stood for ~20-30 seconds with RW.   Ambulation/Gait             General Gait Details: NT-pt unable due to pain  Stairs            Wheelchair Mobility    Modified Rankin (Stroke Patients Only)       Balance Overall balance  assessment: Needs assistance         Standing balance support: Bilateral upper extremity supported;During functional activity Standing balance-Leahy Scale: Poor                               Pertinent Vitals/Pain Pain Assessment: Faces Faces Pain Scale: Hurts whole lot Pain Location: L LE with all movement/activity Pain Descriptors / Indicators: Sore;Sharp;Aching Pain Intervention(s): Premedicated before session;Repositioned;Monitored during session;Limited activity within patient's tolerance    Home Living Family/patient expects to be discharged to:: Skilled nursing facility Living Arrangements: Alone Available Help at Discharge: Available 24 hours/day;Personal care attendant Type of Home: House Home Access: Stairs to enter Entrance Stairs-Rails: Right Entrance Stairs-Number of Steps: 5 back Home Layout: One level Home Equipment: Walker - 2 wheels;Cane - single point;Wheelchair - [employer];Shower seat (oxygen)      Prior Function Level of Independence: Needs assistance   Gait / Transfers Assistance Needed: MOd Ind with RW  ADL's / Homemaking Assistance Needed: assisted with cooking  Comments: aide during day and night. another aide on saturday     Hand Dominance        Extremity/Trunk Assessment   Upper Extremity Assessment: Generalized weakness           Lower Extremity Assessment: Generalized weakness;LLE deficits/detail      Cervical / Trunk Assessment: Kyphotic  Communication   Communication: No difficulties  Cognition Arousal/Alertness: Awake/alert Behavior During Therapy: WFL for tasks assessed/performed Overall Cognitive Status: Difficult to assess  General Comments      Exercises        Assessment/Plan    PT Assessment Patient needs continued PT services  PT Diagnosis Difficulty walking;Generalized weakness;Acute pain   PT Problem List Decreased strength;Decreased range of  motion;Decreased activity tolerance;Decreased balance;Decreased mobility;Decreased knowledge of use of DME;Pain;Cardiopulmonary status limiting activity  PT Treatment Interventions DME instruction;Gait training;Functional mobility training;Therapeutic activities;Therapeutic exercise;Patient/family education;Balance training   PT Goals (Current goals can be found in the Care Plan section) Acute Rehab PT Goals Patient Stated Goal: less pain PT Goal Formulation: With patient/family Time For Goal Achievement: 08/01/14 Potential to Achieve Goals: Fair    Frequency Min 3X/week   Barriers to discharge        Co-evaluation               End of Session Equipment Utilized During Treatment: Oxygen Activity Tolerance: Patient limited by pain;Patient limited by fatigue Patient left: in bed;with call bell/phone within reach;with family/visitor present           Time: 1110-1140 PT Time Calculation (min) (ACUTE ONLY): 30 min   Charges:   PT Evaluation $Initial PT Evaluation Tier I: 1 Procedure PT Treatments $Therapeutic Activity: 8-22 mins   PT G Codes:        [name], MPT Pager: [phone]

## 2014-07-19 ENCOUNTER — Inpatient Hospital Stay (HOSPITAL_COMMUNITY): Payer: MEDICARE

## 2014-07-19 DIAGNOSIS — K5900 Constipation, unspecified: Secondary | ICD-10-CM

## 2014-07-19 LAB — BASIC METABOLIC PANEL
Anion gap: 7 (ref 5–15)
BUN: 87 mg/dL — AB (ref 6–23)
CALCIUM: 8.5 mg/dL (ref 8.4–10.5)
CO2: 33 mmol/L — ABNORMAL HIGH (ref 19–32)
Chloride: 96 mmol/L (ref 96–112)
Creatinine, Ser: 1.85 mg/dL — ABNORMAL HIGH (ref 0.50–1.10)
GFR calc non Af Amer: 23 mL/min — ABNORMAL LOW (ref >90)
GFR, EST AFRICAN AMERICAN: 26 mL/min — AB (ref >90)
Glucose, Bld: 101 mg/dL — ABNORMAL HIGH (ref 70–99)
Potassium: 2.9 mmol/L — ABNORMAL LOW (ref 3.5–5.1)
Sodium: 136 mmol/L (ref 135–145)

## 2014-07-19 LAB — CBC
HCT: 29.2 % — ABNORMAL LOW (ref 36.0–46.0)
Hemoglobin: 9.3 g/dL — ABNORMAL LOW (ref 12.0–15.0)
MCH: 28.5 pg (ref 26.0–34.0)
MCHC: 31.8 g/dL (ref 30.0–36.0)
MCV: 89.6 fL (ref 78.0–100.0)
PLATELETS: 287 10*3/uL (ref 150–400)
RBC: 3.26 MIL/uL — AB (ref 3.87–5.11)
RDW: 16.9 % — ABNORMAL HIGH (ref 11.5–15.5)
WBC: 11.7 10*3/uL — AB (ref 4.0–10.5)

## 2014-07-19 LAB — MAGNESIUM: Magnesium: 2.4 mg/dL (ref 1.5–2.5)

## 2014-07-19 LAB — FOLATE: Folate: 12.8 ng/mL

## 2014-07-19 LAB — FERRITIN: FERRITIN: 249 ng/mL (ref 10–291)

## 2014-07-19 LAB — IRON AND TIBC
IRON: 17 ug/dL — AB (ref 42–145)
Saturation Ratios: 8 % — ABNORMAL LOW (ref 20–55)
TIBC: 218 ug/dL — AB (ref 250–470)
UIBC: 201 ug/dL (ref 125–400)

## 2014-07-19 LAB — VITAMIN B12: VITAMIN B 12: 536 pg/mL (ref 211–911)

## 2014-07-19 MED ORDER — TRAMADOL HCL 50 MG PO TABS: 50.0000 mg | ORAL_TABLET | Freq: Four times a day (QID) | ORAL | Status: DC | PRN

## 2014-07-19 MED ORDER — TRAMADOL HCL 50 MG PO TABS
50.0000 mg | ORAL_TABLET | Freq: Four times a day (QID) | ORAL | Status: DC | PRN
  Administered 2014-07-19 – 2014-07-22 (×10): 50 mg via ORAL
  Filled 2014-07-19 (×10): qty 1

## 2014-07-19 MED ORDER — POTASSIUM CHLORIDE CRYS ER 20 MEQ PO TBCR
40.0000 meq | EXTENDED_RELEASE_TABLET | ORAL | Status: AC
  Administered 2014-07-19 (×2): 40 meq via ORAL
  Filled 2014-07-19 (×2): qty 2

## 2014-07-19 MED ORDER — TRAMADOL HCL 50 MG PO TABS: 50.0000 mg | ORAL_TABLET | Freq: Four times a day (QID) | ORAL | Status: AC | PRN

## 2014-07-19 MED ORDER — ACETAMINOPHEN 500 MG PO TABS: 1000.0000 mg | ORAL_TABLET | Freq: Three times a day (TID) | ORAL | Status: AC

## 2014-07-19 MED ORDER — ASPIRIN 325 MG PO TBEC: 325.0000 mg | DELAYED_RELEASE_TABLET | Freq: Every day | ORAL | Status: DC

## 2014-07-19 MED ORDER — ACETAMINOPHEN 650 MG RE SUPP: 650.0000 mg | Freq: Three times a day (TID) | RECTAL | Status: DC

## 2014-07-19 NOTE — Progress Notes (Signed)
eLink Physician-Brief Progress Note Patient Name: [name] DOB: [birth year] MRN: [id number]   Date of Service  07/19/2014  HPI/Events of Note  Hypokalemia  eICU Interventions  Potassium replaced     Intervention Category Intermediate Interventions: Electrolyte abnormality - evaluation and management  Georgia Baria 07/19/2014, 5:44 AM

## 2014-07-19 NOTE — Progress Notes (Addendum)
Subjective: 2 Days Post-Op Procedure(s) (LRB): INTRAMEDULLARY (IM) NAIL FEMORAL (Left) Patient reports pain as moderate.   Patient seen in rounds with Dr.Gioffre. Patient is well, and has had no acute complaints or problems other than poor pain control. No issues overnight. Limited with PT due to pain. Patient sitting up eating during rounds. Plan is to go Skilled nursing facility after hospital stay.  Objective: Vital signs in last 24 hours: Temp:  [97.4 F (36.3 C)-98 F (36.7 C)] 97.4 F (36.3 C) (03/12 0742) Pulse Rate:  [64-93] 78 (03/12 0905) Resp:  [11-40] 17 (03/12 0905) BP: (74-126)/(39-74) 108/74 mmHg (03/12 0905) SpO2:  [86 %-97 %] 96 % (03/12 0905) Arterial Line BP: (97)/(49) 97/49 mmHg (03/11 1000) FiO2 (%):  [55 %] 55 % (03/11 1200) Weight:  [62.8 kg (138 lb 7.2 oz)] 62.8 kg (138 lb 7.2 oz) (03/12 0500)  Intake/Output from previous day:  Intake/Output Summary (Last 24 hours) at 07/19/14 0940 Last data filed at 07/19/14 0900  Gross per 24 hour  Intake   2340 ml  Output   1335 ml  Net   1005 ml    Intake/Output this shift: Total I/O In: -  Out: 290 [Urine:290]  Labs:  Recent Labs  07/17/14 0415 07/18/14 0430 07/19/14 0405  HGB 10.5* 11.3* 9.3*    Recent Labs  07/18/14 0430 07/19/14 0405  WBC 17.5* 11.7*  RBC 4.00 3.26*  HCT 36.5 29.2*  PLT 289 287    Recent Labs  07/18/14 1450 07/19/14 0405  NA 137 136  K 3.2* 2.9*  CL 94* 96  CO2 29 33*  BUN 81* 87*  CREATININE 2.05* 1.85*  GLUCOSE 130* 101*  CALCIUM 8.2* 8.5    EXAM General - Patient is Alert and Oriented Extremity - Neurologically intact Dorsiflexion/Plantar flexion intact Dressing/Incision - clean, dry, no drainage Motor Function - intact, moving foot and toes well on exam.   Past Medical History  Diagnosis Date  . Essential hypertension     controlled  . Osteoporosis   . Persistent atrial fibrillation     probably permanent  . Vertigo   . Chronic renal  insufficiency   . Hypothyroidism   . Arthritis   . Anxiety   . On home oxygen therapy     uses O2 @ 2l/m nasally bedtime  . Tachy-brady syndrome      treated with amiodarone and permanent DDD pacemaker, 1993  . GERD (gastroesophageal reflux disease)   . Atrial flutter   . Encounter for long-term (current) use of other medications     prior bleeding with coumadin, presently on asa  . Chronic diastolic heart failure   . Coronary atherosclerosis of unspecified type of vessel, native or graft     asymptomatic  . Anemia, unspecified   . Depression   . Retroperitoneal bleed 2005    LIFE THREATENING spontaneous retroperitoneal bleed on coumadin therapy 2005  . Respiratory failure 07/2011    Assessment/Plan: 2 Days Post-Op Procedure(s) (LRB): INTRAMEDULLARY (IM) NAIL FEMORAL (Left) Principal Problem:   Acute on chronic respiratory failure with hypoxia Active Problems:   Atrial fibrillation   Elevated troponin   Essential hypertension   Chronic diastolic heart failure   Chronic kidney disease, stage III (moderate)   Femur fracture, left   Closed left hip fracture   Hypokalemia   Pulmonary hypertension   Acute on chronic diastolic heart failure   Intertrochanteric fracture of left femur   Dyspnea   Constipation  Estimated body mass index  is 25.32 kg/(m^2) as calculated from the following:   Height as of this encounter: 5\' 2"  (1.575 m).   Weight as of this encounter: 62.8 kg (138 lb 7.2 oz). Advance diet Up with therapy Discharge to SNF when medically stable  DVT Prophylaxis - Aspirin Weight-Bearing as tolerated   Continue with PT today. Slight adjustment to pain meds. Will try to hold off of narcotics. After discussion with pharmacy, will not increase tramadol due to renal function. Stable for DC to SNF from ortho standpoint.   Dimitri PedAmber Analisa Sledd, PA-C Orthopaedic Surgery 07/19/2014, 9:40 AM

## 2014-07-19 NOTE — Progress Notes (Signed)
Subjective:  Tolerated operating room well yesterday.  Fairly stable overnight.  No current shortness of breath or chest pain.  Objective:  Vital Signs in the last 24 hours: BP 102/40 mmHg  Pulse 83  Temp(Src) 97.4 F (36.3 C) (Oral)  Resp 12  Ht 5\' 2"  (1.575 m)  Wt 62.8 kg (138 lb 7.2 oz)  BMI 25.32 kg/m2  SpO2 93%  Physical Exam: Elderly white female in no acute distress Lungs:  Clear  Cardiac:  Irregular rhythm, normal S1 and S2, no S3 Extremities:  No edema present  Intake/Output from previous day: 03/11 0701 - 03/12 0700 In: 2900 [P.O.:1350; I.V.:1150; IV Piggyback:400] Out: 1820 [Urine:1820] Weight Filed Weights   07/17/14 0800 07/18/14 0500 07/19/14 0500  Weight: 60.8 kg (134 lb 0.6 oz) 62 kg (136 lb 11 oz) 62.8 kg (138 lb 7.2 oz)    Lab Results: Basic Metabolic Panel:  Recent Labs  82/95/6202/03/24 1450 07/19/14 0405  NA 137 136  K 3.2* 2.9*  CL 94* 96  CO2 29 33*  GLUCOSE 130* 101*  BUN 81* 87*  CREATININE 2.05* 1.85*    CBC:  Recent Labs  07/18/14 0430 07/19/14 0405  WBC 17.5* 11.7*  HGB 11.3* 9.3*  HCT 36.5 29.2*  MCV 91.3 89.6  PLT 289 287    BNP    Component Value Date/Time   BNP 359.3* 07/15/2014 0413    PROTIME: Lab Results  Component Value Date   INR 1.10 07/11/2014   INR 1.12 04/29/2010   INR 1.1 11/21/2006    Telemetry: Atrial fibrillation with controlled response  Assessment/Plan:  1.  Acute on chronic diastolic heart failure appears compensated following surgery 2.  Hypokalemia need of replacement 3.  Chronic atrial fibrillation not candidate for anticoagulation 4.  Chronic respiratory failure on oxygen  Recommendations:  Replace potassium.  Continue oral diuretics and be careful with IV fluids.     Darden PalmerW. Spencer Gwyn Hieronymus, Jr.  MD [facility] Cardiology  07/19/2014, 8:19 AM

## 2014-07-19 NOTE — Clinical Social Work Note (Addendum)
CSW met with [name] and her daughter at bedside to provide SNF bed options  CSW provided [name] and family with a list of facilites that can accommodate [name] at discharge for her rehab needs  [name]'s daughter, Katharine Look in the room reviewed the list and stated that she would discuss with her sister Arrie Aran who has more knowledge of the faculties  CSW explained that there were certain facilities who had already said yes and [name] and family can decide which one they would like for [name]'s rehab needs  [name] had expressed interest in [facility] but they cannot accommodate [name] at this time.  CSW explained this to [name] and family  CSW had left a message for Dawn previously but had not received a call back  CSW will follow up with [name]'s daughters to gain approval for a SNF facility  .Dede Query, LCSW [facility] Clinical Social Worker - Weekend Coverage cell #: [phone]

## 2014-07-19 NOTE — Progress Notes (Signed)
[facility] PROGRESS NOTE  [name] ZOX:[id number] DOB: 10-Apr-1920 DOA: 07/11/2014 PCP: Carollee Herter, MD  Brief narrative:    81 y.o. female with HTN, atrial fibrillation but not on AC due to bleeding (was on Coumadin in 2005 but that was stopped), CKD stage IV, diastolic CHF, on oxygen at home, per family member also unknown lung injury from Amiodarone (pt was on for 8 years), PM [employer] (DDD with atrial undersensing) COPD, severe pulm HTN, presented to [facility] ED after an episode of fall and has subsequently sustained left hip fracture. Due to multiple co morbid conditions, cariology for consulted for pre op clearance prior to surgery.   Events since admission: 3/4 - admitted to telemetry unit, trop elevated 0.11 3/5 - persistently hypoxic with O2 sat in 80's on 6 L [place] and up to low 90's with venti mask, transferred to SDU       Assessment/Plan: #1 acute on chronic respiratory failure Likely multifactorial secondary to acute on chronic diastolic heart failure in the setting of atrial fibrillation, chronic COPD on home O2, pulmonary hypertension. Patient on NRB post surgery. Chest x-ray was consistent with volume overload. Patient is -7.55L since admission 07/12/14. Current weight at  136 lbs from 134lb from 137 pounds from 138 pounds. Troponins were elevated however felt to be secondary to demand ischemia. 2-D echo with EF of 55%. IV Lasix have been changed to oral Lasix per cardiology. Continue atenolol. D/C Zaroxyln. Monitor oxygen requirements. Cardiology following and appreciate input and recommendations. PCCM ff and appreciate input and rxcs.   #2 acute on chronic diastolic heart failure Patient with elevated troponins felt to be secondary to a demand ischemia. 2-D echo with EF of 55%. Patient with good diuresis and is -5.75 L since admission 07/12/14. Current weight 136 lbs from 134lbs from 137 LBs from 138 pounds. IV Lasix has been changed to oral Lasix per cardiology.  Continue beta blocker. D/C Zaroxyln . Cardiology following and appreciate input and recommendations.  #3 left intertrochanteric hip fracture Patient is being seen by orthopedics. Patient s/p Nail.  Patient currently on NRB. Lasix has been changed to PO per cardiology. PT/OT. Ortho ff.   #4 hypothyroidism Continue home dose Synthroid.  #5 atrial fibrillation Continue atenolol for rate control. Patient not deemed a long-term anticoagulation candidate secondary to prior bleeding on Coumadin. Per cardiology.  #6 hypokalemia Likely secondary to diuresis. Replete.  #7 elevated troponin Felt to be secondary to demand ischemia. Per cardiology no ischemic workup planned at this time.  #8 COPD Stable. Nebs as needed.  #9 hypertension Stable. On atenolol. Monitor on Lasix.  #10 chronic kidney disease stage IV Currently stable. Baseline creatinine is 2.1-2.3. Monitor closely with diuresis. Renal function actually has improved.   #11 Anemia Likely postop anemia. No overt bleeding. Check anemia panel. Follow h/h  #12 severe protein calorie malnutrition Continue current diet. Follow.  #13 prophylaxis SCDs for DVT prophylaxis.  Code Status: Full Family Communication: Updated patient. No family at bedside. Disposition Plan: Remain in the step down.   Consultants:  Orthopedics: Dr. supple 07/12/2014  Cardiology: Dr. Ace Gins 07/12/2014  PCCM Dr Kendrick Fries 07/15/14  Procedures:  Chest x-ray 07/11/2014, 07/13/2014, 07/19/14  2-D echo 07/12/2014  X-ray of the left femur 07/12/2014  X-ray of the left hip with pelvis 07/11/2014  Cephalomedullary nail placement, left pertrochanteric femur 07/17/14  Antibiotics:  None  HPI/Subjective: Patient with complaints of left hip pain. Denies CP. Feels breathing is improving.  Objective: Filed Vitals:  07/19/14 0742  BP:   Pulse:   Temp: 97.4 F (36.3 C)  Resp:     Intake/Output Summary (Last 24 hours) at 07/19/14 0841 Last data  filed at 07/19/14 0600  Gross per 24 hour  Intake   2390 ml  Output   1045 ml  Net   1345 ml   Filed Weights   07/17/14 0800 07/18/14 0500 07/19/14 0500  Weight: 60.8 kg (134 lb 0.6 oz) 62 kg (136 lb 11 oz) 62.8 kg (138 lb 7.2 oz)    Exam:   General:  On NRB  Cardiovascular: Irregularly irregular.  Respiratory: Bibasilar crackles. No wheezing.  Abdomen: Soft, nontender, nondistended, positive bowel sounds.  Musculoskeletal: No clubbing cyanosis or edema. Left lower extremity with TTP.  Data Reviewed: Basic Metabolic Panel:  Recent Labs Lab 07/14/14 0355  07/16/14 0400 07/17/14 0415 07/18/14 0430 07/18/14 1450 07/19/14 0405  NA 138  < > 137 139 143 137 136  K 3.2*  < > 3.0* 3.4* 2.7* 3.2* 2.9*  CL 100  < > 94* 95* 99 94* 96  CO2 31  < > 31 32 33* 29 33*  GLUCOSE 114*  < > 102* 86 99 130* 101*  BUN 31*  < > 46* 63* 70* 81* 87*  CREATININE 1.77*  < > 1.56* 1.97* 1.69* 2.05* 1.85*  CALCIUM 8.4  < > 8.7 9.0 8.4 8.2* 8.5  MG 2.0  --   --   --   --   --   --   < > = values in this interval not displayed. Liver Function Tests: No results for input(s): AST, ALT, ALKPHOS, BILITOT, PROT, ALBUMIN in the last 168 hours. No results for input(s): LIPASE, AMYLASE in the last 168 hours. No results for input(s): AMMONIA in the last 168 hours. CBC:  Recent Labs Lab 07/15/14 0413 07/16/14 0400 07/17/14 0415 07/18/14 0430 07/19/14 0405  WBC 18.8* 12.8* 14.2* 17.5* 11.7*  NEUTROABS  --  9.7*  --   --   --   HGB 11.7* 11.5* 10.5* 11.3* 9.3*  HCT 39.1 36.9 34.2* 36.5 29.2*  MCV 94.4 92.0 92.2 91.3 89.6  PLT 240 PLATELET CLUMPS NOTED ON SMEAR, COUNT APPEARS ADEQUATE 241 289 287   Cardiac Enzymes:  Recent Labs Lab 07/12/14 0914 07/12/14 1508  TROPONINI 0.12* 0.11*   BNP (last 3 results)  Recent Labs  07/11/14 2328 07/15/14 0413  BNP 299.6* 359.3*    ProBNP (last 3 results)  Recent Labs  07/25/13 1215  PROBNP 4206.0*    CBG: No results for input(s):  GLUCAP in the last 168 hours.  Recent Results (from the past 240 hour(s))  Urine culture     Status: None   Collection Time: 07/12/14  2:25 AM  Result Value Ref Range Status   Specimen Description URINE, CATHETERIZED  Final   Special Requests Normal  Final   Colony Count NO GROWTH Performed at [facility]   Final   Culture NO GROWTH Performed at [facility]   Final   Report Status 07/13/2014 FINAL  Final  Surgical pcr screen     Status: None   Collection Time: 07/12/14 12:54 PM  Result Value Ref Range Status   MRSA, PCR NEGATIVE NEGATIVE Final   Staphylococcus aureus NEGATIVE NEGATIVE Final    Comment:        The Xpert SA Assay (FDA approved for NASAL specimens in patients over 72 years of age), is one component of a comprehensive surveillance  program.  Test performance has been validated by [facility] for patients greater than or equal to 64 year old. It is not intended to diagnose infection nor to guide or monitor treatment.      Studies: Pelvis Portable  07/18/2014   CLINICAL DATA:  Post ORIF.  EXAM: PORTABLE PELVIS 1-2 VIEWS  COMPARISON:  Left femur radiographs 07/12/2014  FINDINGS: Intra medullary rod with locking screw traversing left intertrochanteric femur fracture. Alignment is improved. Recent postsurgical change includes air in the soft tissues. Right hip arthroplasty in place. Examination is limited by positioning.  IMPRESSION: Intra medullary rod with proximal locking screw traversing left intertrochanteric femur fracture. No immediate postoperative complication.   Electronically Signed   By: Rubye Oaks M.D.   On: 07/18/2014 06:02   Dg Chest Port 1 View  07/19/2014   CLINICAL DATA:  Respiratory failure. History of atrial fibrillation, hypertension and Coronary artery disease.  EXAM: PORTABLE CHEST - 1 VIEW  COMPARISON:  07/16/2014  FINDINGS: Left chest wall pacer device is noted with lead in the right atrial appendage and right ventricle.  Previous hardware fixation of the proximal left humerus. The heart size is moderately enlarged. Diffuse coarsened interstitial markings are identified bilaterally. Superimposed interstitial and airspace opacities in both lungs appear unchanged from previous exam.  IMPRESSION: 1. Persistent bilateral interstitial and airspace opacities, likely due to edema.   Electronically Signed   By: Signa Kell M.D.   On: 07/19/2014 08:36   Dg C-arm 1-60 Min-no Report  07/17/2014   CLINICAL DATA:  Intertrochanteric left femur fracture fixation. Initial encounter.  EXAM: DG C-ARM 1-60 MIN - NRPT [facility]; LEFT FEMUR 2 VIEWS  COMPARISON:  Radiographs 07/12/2014.  FLUOROSCOPY TIME: C-arm fluoroscopic images were obtained intraoperatively and submitted for post operative interpretation. Please see the performing provider's procedural report for the fluoroscopy time utilized.  FINDINGS: Four spot fluoroscopic images of the left femur demonstrate intramedullary rod and dynamic screw fixation of the comminuted intertrochanteric femur fracture. There is a distal interlocking screw. The main fracture fragments demonstrate near anatomic alignment. No demonstrated complication.  IMPRESSION: ORIF of intertrochanteric left femur fracture. No demonstrated complication.   Electronically Signed   By: Carey Bullocks M.D.   On: 07/17/2014 22:27   Dg Femur Min 2 Views Left  07/18/2014   CLINICAL DATA:  Post ORIF.  EXAM: LEFT FEMUR 2 VIEWS  COMPARISON:  07/12/2014  FINDINGS: Intra medullary rod with proximal and distal locking screws traverse left hip intertrochanteric femur fracture. There is improved alignment. Displaced fragment involving the lesser trochanter is again seen. Recent postsurgical change includes air in the adjacent soft tissues.  IMPRESSION: Post ORIF left intertrochanteric femur fracture without immediate postoperative complication.   Electronically Signed   By: Rubye Oaks M.D.   On: 07/18/2014 06:04   Dg Femur Min 2  Views Left  07/17/2014   CLINICAL DATA:  Intertrochanteric left femur fracture fixation. Initial encounter.  EXAM: DG C-ARM 1-60 MIN - NRPT [facility]; LEFT FEMUR 2 VIEWS  COMPARISON:  Radiographs 07/12/2014.  FLUOROSCOPY TIME: C-arm fluoroscopic images were obtained intraoperatively and submitted for post operative interpretation. Please see the performing provider's procedural report for the fluoroscopy time utilized.  FINDINGS: Four spot fluoroscopic images of the left femur demonstrate intramedullary rod and dynamic screw fixation of the comminuted intertrochanteric femur fracture. There is a distal interlocking screw. The main fracture fragments demonstrate near anatomic alignment. No demonstrated complication.  IMPRESSION: ORIF of intertrochanteric left femur fracture. No demonstrated complication.  Electronically Signed   By: Carey BullocksWilliam  Veazey M.D.   On: 07/17/2014 22:27    Scheduled Meds: . acetaminophen  1,000 mg Oral 3 times per day   Or  . acetaminophen  650 mg Rectal 3 times per day  . antiseptic oral rinse  7 mL Mouth Rinse BID  . aspirin EC  325 mg Oral Q breakfast  . atenolol  25 mg Oral BID  . buPROPion  300 mg Oral Daily  . docusate sodium  100 mg Oral BID  . furosemide  80 mg Oral Daily  . levothyroxine  88 mcg Oral QAC breakfast  . pantoprazole  40 mg Oral QHS  . potassium chloride  40 mEq Oral Q4H  . senna  1 tablet Oral BID  . sorbitol, milk of mag, mineral oil, glycerin (SMOG) enema  960 mL Rectal Once   Continuous Infusions: . lactated ringers 50 mL/hr at 07/19/14 0600    Principal Problem:   Acute on chronic respiratory failure with hypoxia Active Problems:   Closed left hip fracture   Acute on chronic diastolic heart failure   Atrial fibrillation   Elevated troponin   Essential hypertension   Chronic diastolic heart failure   Chronic kidney disease, stage III (moderate)   Femur fracture, left   Hypokalemia   Pulmonary hypertension   Intertrochanteric fracture of  left femur   Dyspnea    Time spent: 40 minutes    Jashua Knaak M.D. [facility] Pager [phone]. If 7PM-7AM, please contact night-coverage at [email or web address], password [email or web address] 07/19/2014, 8:41 AM  LOS: 7 days

## 2014-07-19 NOTE — Discharge Instructions (Signed)
Dr. Samson FredericBrian Swinteck Total Joint Specialist [facility] [address] [place], [place] [address] [phone]    POSTOPERATIVE DIRECTIONS   Hip Rehabilitation, Guidelines Following Surgery  The results of a hip operation are greatly improved after range of motion and muscle strengthening exercises. Follow all safety measures which are given to protect your hip. If any of these exercises cause increased pain or swelling in your joint, decrease the amount until you are comfortable again. Then slowly increase the exercises. Call your caregiver if you have problems or questions.  HOME CARE INSTRUCTIONS  Most of the following instructions are designed to prevent the dislocation of your new hip.  Remove items at home which could result in a fall. This includes throw rugs or furniture in walking pathways.  Continue medications as instructed at time of discharge.  You may have some home medications which will be placed on hold until you complete the course of blood thinner medication.  You may start showering once you are discharged home but do not submerge the incision under water. Just pat the incision dry and apply a dry gauze dressing on daily. Do not put on socks or shoes without following the instructions of your caregivers.  Sit on high chairs which makes it easier to stand.  Sit on chairs with arms. Use the chair arms to help push yourself up when arising.  Keep your leg on the side of the operation out in front of you when standing up.  Arrange for the use of a toilet seat elevator so you are not sitting low.    Walk with walker as instructed.  You may resume a sexual relationship in one month or when given the OK by your caregiver.  Use walker as long as suggested by your caregivers.  Avoid periods of inactivity such as sitting longer than an hour when not asleep. This helps prevent blood clots.  You may return to work once you are cleared by Designer, industrial/productyour surgeon.    Do not drive a car for 6 weeks or until released by your surgeon.  Do not drive while taking narcotics.  Wear elastic stockings for three weeks following surgery during the day but you may remove then at night.  Make sure you keep all of your appointments after your operation with all of your doctors and caregivers. You should call the office at the above phone number and make an appointment for approximately two weeks after the date of your surgery. Change the dressing daily and reapply a dry dressing each time. Please pick up a stool softener and laxative for home use as long as you are requiring pain medications.  ICE to the affected hip every three hours for 30 minutes at a time and then as needed for pain and swelling.  Continue to use ice on the hip for pain and swelling from surgery. You may notice swelling that will progress down to the foot and ankle.  This is normal after surgery.  Elevate the leg when you are not up walking on it.   It is important for you to complete the blood thinner medication as prescribed by your doctor.  Continue to use the breathing machine which will help keep your temperature down.  It is common for your temperature to cycle up and down following surgery, especially at night when you are not up moving around and exerting yourself.  The breathing machine keeps your lungs expanded and your temperature down.  RANGE OF MOTION  AND STRENGTHENING EXERCISES  These exercises are designed to help you keep full movement of your hip joint. Follow your caregiver's or physical therapist's instructions. Perform all exercises about fifteen times, three times per day or as directed. Exercise both hips, even if you have had only one joint replacement. These exercises can be done on a training (exercise) mat, on the floor, on a table or on a bed. Use whatever works the best and is most comfortable for you. Use music or television while you are exercising so that the exercises are a  pleasant break in your day. This will make your life better with the exercises acting as a break in routine you can look forward to.  Lying on your back, slowly slide your foot toward your buttocks, raising your knee up off the floor. Then slowly slide your foot back down until your leg is straight again.  Lying on your back spread your legs as far apart as you can without causing discomfort.  Lying on your side, raise your upper leg and foot straight up from the floor as far as is comfortable. Slowly lower the leg and repeat.  Lying on your back, tighten up the muscle in the front of your thigh (quadriceps muscles). You can do this by keeping your leg straight and trying to raise your heel off the floor. This helps strengthen the largest muscle supporting your knee.  Lying on your back, tighten up the muscles of your buttocks both with the legs straight and with the knee bent at a comfortable angle while keeping your heel on the floor.   SKILLED REHAB INSTRUCTIONS: If the patient is transferred to a skilled rehab facility following release from the hospital, a list of the current medications will be sent to the facility for the patient to continue.  When discharged from the skilled rehab facility, please have the facility set up the patient's Home Health Physical Therapy prior to being released. Also, the skilled facility will be responsible for providing the patient with their medications at time of release from the facility to include their pain medication, the muscle relaxants, and their blood thinner medication. If the patient is still at the rehab facility at time of the two week follow up appointment, the skilled rehab facility will also need to assist the patient in arranging follow up appointment in our office and any transportation needs.  MAKE SURE YOU:  Understand these instructions.  Will watch your condition.  Will get help right away if you are not doing well or get worse.  Pick up  stool softner and laxative for home use following surgery while on pain medications. Do not submerge incision under water. Please use good hand washing techniques while changing dressing each day. May shower starting three days after surgery. Please use a clean towel to pat the incision dry following showers. Continue to use ice for pain and swelling after surgery. Do not use any lotions or creams on the incision until instructed by your surgeon. DVT prophylaxis with ASA 325 mg PO daily for 30 days Weight bearing as tolerated

## 2014-07-20 DIAGNOSIS — S7290XA Unspecified fracture of unspecified femur, initial encounter for closed fracture: Secondary | ICD-10-CM | POA: Insufficient documentation

## 2014-07-20 LAB — BASIC METABOLIC PANEL
Anion gap: 14 (ref 5–15)
BUN: 81 mg/dL — ABNORMAL HIGH (ref 6–23)
CHLORIDE: 97 mmol/L (ref 96–112)
CO2: 31 mmol/L (ref 19–32)
CREATININE: 1.59 mg/dL — AB (ref 0.50–1.10)
Calcium: 8.9 mg/dL (ref 8.4–10.5)
GFR calc Af Amer: 31 mL/min — ABNORMAL LOW (ref >90)
GFR, EST NON AFRICAN AMERICAN: 27 mL/min — AB (ref >90)
GLUCOSE: 91 mg/dL (ref 70–99)
Potassium: 3 mmol/L — ABNORMAL LOW (ref 3.5–5.1)
Sodium: 142 mmol/L (ref 135–145)

## 2014-07-20 LAB — CBC
HEMATOCRIT: 31.1 % — AB (ref 36.0–46.0)
HEMOGLOBIN: 9.7 g/dL — AB (ref 12.0–15.0)
MCH: 28 pg (ref 26.0–34.0)
MCHC: 31.2 g/dL (ref 30.0–36.0)
MCV: 89.9 fL (ref 78.0–100.0)
Platelets: 347 10*3/uL (ref 150–400)
RBC: 3.46 MIL/uL — AB (ref 3.87–5.11)
RDW: 17.1 % — ABNORMAL HIGH (ref 11.5–15.5)
WBC: 11.5 10*3/uL — ABNORMAL HIGH (ref 4.0–10.5)

## 2014-07-20 MED ORDER — POTASSIUM CHLORIDE CRYS ER 20 MEQ PO TBCR
EXTENDED_RELEASE_TABLET | ORAL | Status: AC
  Filled 2014-07-20: qty 2

## 2014-07-20 MED ORDER — GI COCKTAIL ~~LOC~~
30.0000 mL | Freq: Three times a day (TID) | ORAL | Status: DC | PRN
  Filled 2014-07-20: qty 30

## 2014-07-20 MED ORDER — POTASSIUM CHLORIDE CRYS ER 20 MEQ PO TBCR
40.0000 meq | EXTENDED_RELEASE_TABLET | ORAL | Status: AC
  Administered 2014-07-20 (×2): 40 meq via ORAL

## 2014-07-20 MED ORDER — SENNA 8.6 MG PO TABS
1.0000 | ORAL_TABLET | Freq: Every day | ORAL | Status: DC
  Administered 2014-07-21 – 2014-07-22 (×2): 8.6 mg via ORAL
  Filled 2014-07-20 (×2): qty 1

## 2014-07-20 NOTE — Progress Notes (Signed)
Subjective:  Did not sleep well last night and feels poorly this morning.  No complaints of shortness of breath and is sitting up in bed eating.  Objective:  Vital Signs in the last 24 hours: BP 132/97 mmHg  Pulse 84  Temp(Src) 98.6 F (37 C) (Oral)  Resp 19  Ht 5\' 2"  (1.575 m)  Wt 60.8 kg (134 lb 0.6 oz)  BMI 24.51 kg/m2  SpO2 91%  Physical Exam: Elderly white female in no acute distress Lungs:  Clear  Cardiac:  Irregular rhythm, normal S1 and S2, no S3, healed pacer and left upper chest Extremities:  No edema present  Intake/Output from previous day: 03/12 0701 - 03/13 0700 In: 1220 [P.O.:420; I.V.:800] Out: 2250 [Urine:2250] Weight Filed Weights   07/18/14 0500 07/19/14 0500 07/20/14 0500  Weight: 62 kg (136 lb 11 oz) 62.8 kg (138 lb 7.2 oz) 60.8 kg (134 lb 0.6 oz)    Lab Results: Basic Metabolic Panel:  Recent Labs  78/29/5602/04/23 0405 07/20/14 0412  NA 136 142  K 2.9* 3.0*  CL 96 97  CO2 33* 31  GLUCOSE 101* 91  BUN 87* 81*  CREATININE 1.85* 1.59*    CBC:  Recent Labs  07/19/14 0405 07/20/14 0412  WBC 11.7* 11.5*  HGB 9.3* 9.7*  HCT 29.2* 31.1*  MCV 89.6 89.9  PLT 287 347    BNP    Component Value Date/Time   BNP 359.3* 07/15/2014 0413   Telemetry: Atrial fibrillation with controlled response  Assessment/Plan:  1.  Acute on chronic diastolic heart failure continues compensated, good diuresis overnight and no weight gain.  2.  Hypokalemia need of replacement 3.  Chronic atrial fibrillation not candidate for anticoagulation 4.  Chronic respiratory failure on oxygen  Recommendations:  Replace potassium.  She is okay to proceed with physical therapy and other orthopedic rehabilitation.     Darden PalmerW. Spencer Joanny Dupree, Jr.  MD [facility]  07/20/2014, 8:17 AM

## 2014-07-20 NOTE — Progress Notes (Signed)
[facility] PROGRESS NOTE  [name] RUE:[id number] DOB: Sep 11, 1919 DOA: 07/11/2014 PCP: Carollee Herter, MD  Brief narrative:    81 y.o. 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 Due to multiple co morbid conditions, cariology for consulted for pre op clearance prior to surgery.   Events since admission: 3/4 - admitted to telemetry unit, trop elevated 0.11 3/5 - persistently hypoxic with O2 sat in 80's on 6 L [place] and up to low 90's with venti mask, transferred to SDU   patient with some increased O2 requirements was seen in consultation by [facility] M was continued on IV Lasix and started on Zaroxolyn. Patient with good diuresis. Patient subsequently went to the OR and underwent IM nail to the left femur. Patient tolerated procedure well. Patient weaning down on O2 sats. Zaroxolyn discontinued. Patient with good diuresis. Patient currently on 3 L nasal cannula with sats of 91-93% which is her baseline . Patient to be transferred to telemetry 07/20/2014       Assessment/Plan: #1 acute on chronic respiratory failure Likely multifactorial secondary to acute on chronic diastolic heart failure in the setting of atrial fibrillation, chronic COPD on home O2, pulmonary hypertension. Clinical improvement. On 3L [place] with sats 91-92%.  Chest x-ray was consistent with volume overload a few days ago. Patient is -7.55L since admission 07/12/14. Current weight at  134 lbs from 136 lbs from 134lb from 137 pounds from 138 pounds. Troponins were elevated however felt to be secondary to demand ischemia. 2-D echo with EF of 55%. IV Lasix have been  changed to oral Lasix per cardiology. Continue atenolol. D/C Zaroxyln. Monitor oxygen requirements. Cardiology following and appreciate input and recommendations. PCCM ff and appreciate input and rxcs.   #2 acute on chronic diastolic heart failure Patient with elevated troponins felt to be secondary to a demand ischemia. 2-D echo with EF of 55%. Patient with good diuresis and is -7.75 L since admission 07/12/14. Current weight 134b lbs from 136 lbs from 134lbs from 137 LBs from 138 pounds. IV Lasix has been changed to oral Lasix per cardiology. Continue beta blocker. D/C'd Zaroxyln . Cardiology following and appreciate input and recommendations.  #3 left intertrochanteric hip fracture Patient is being seen by orthopedics. Patient s/p Nail.  Patient currently on 3L [place] with sats 91-92 %, her baseline. Lasix has been changed to PO per cardiology. PT/OT. Ortho ff.   #4 hypothyroidism Continue home dose Synthroid.  #5 atrial fibrillation Continue atenolol for rate control. Patient not deemed a long-term anticoagulation candidate secondary to prior bleeding on Coumadin. Per cardiology.  #6 hypokalemia Likely secondary to diuresis. Replete.  #7 elevated troponin Felt to be secondary to demand ischemia. Per cardiology no ischemic workup planned at this time.  #8 COPD Stable. Nebs as needed.  #9 hypertension Stable. On atenolol. Monitor on Lasix.  #10 chronic kidney disease stage IV Currently stable. Baseline creatinine is 2.1-2.3. Monitor closely with diuresis. Renal function actually has improved.   #11 Anemia Likely postop anemia. No overt bleeding. H/H stable.   #12 severe protein calorie malnutrition Continue current diet. Follow.  #13 prophylaxis SCDs for  DVT prophylaxis.  Code Status: Full Family Communication: Updated patient and daughter Dawn via telephone. Disposition Plan: Transfer to telemetry   Consultants:  Orthopedics: Dr. supple 07/12/2014  Cardiology: Dr. Ace Gins  07/12/2014  PCCM Dr Kendrick Fries 07/15/14  Procedures:  Chest x-ray 07/11/2014, 07/13/2014, 07/19/14  2-D echo 07/12/2014  X-ray of the left femur 07/12/2014  X-ray of the left hip with pelvis 07/11/2014  Cephalomedullary nail placement, left pertrochanteric femur 07/17/14  Antibiotics:  None  HPI/Subjective: Denies CP. Feels breathing is improving. States not feeling too well. Multiple BM overnight per nursing.  Objective: Filed Vitals:   07/20/14 0800  BP: 132/97  Pulse: 84  Temp: 97.3 F (36.3 C)  Resp: 19    Intake/Output Summary (Last 24 hours) at 07/20/14 0830 Last data filed at 07/20/14 0800  Gross per 24 hour  Intake   1180 ml  Output   3275 ml  Net  -2095 ml   Filed Weights   07/18/14 0500 07/19/14 0500 07/20/14 0500  Weight: 62 kg (136 lb 11 oz) 62.8 kg (138 lb 7.2 oz) 60.8 kg (134 lb 0.6 oz)    Exam:   General:  On NRB  Cardiovascular: Irregularly irregular.  Respiratory: CTAB anterior lung fields.  Abdomen: Soft, nontender, nondistended, positive bowel sounds.  Musculoskeletal: No clubbing cyanosis or edema. Left lower extremity with some TTP.  Data Reviewed: Basic Metabolic Panel:  Recent Labs Lab 07/14/14 0355  07/17/14 0415 07/18/14 0430 07/18/14 1450 07/19/14 0405 07/19/14 0918 07/20/14 0412  NA 138  < > 139 143 137 136  --  142  K 3.2*  < > 3.4* 2.7* 3.2* 2.9*  --  3.0*  CL 100  < > 95* 99 94* 96  --  97  CO2 31  < > 32 33* 29 33*  --  31  GLUCOSE 114*  < > 86 99 130* 101*  --  91  BUN 31*  < > 63* 70* 81* 87*  --  81*  CREATININE 1.77*  < > 1.97* 1.69* 2.05* 1.85*  --  1.59*  CALCIUM 8.4  < > 9.0 8.4 8.2* 8.5  --  8.9  MG 2.0  --   --   --   --   --  2.4  --   < > = values in this interval not displayed. Liver Function Tests: No results for input(s): AST, ALT, ALKPHOS, BILITOT, PROT, ALBUMIN in the last 168 hours. No results for input(s): LIPASE, AMYLASE in the last 168 hours. No results for input(s): AMMONIA in the last 168  hours. CBC:  Recent Labs Lab 07/16/14 0400 07/17/14 0415 07/18/14 0430 07/19/14 0405 07/20/14 0412  WBC 12.8* 14.2* 17.5* 11.7* 11.5*  NEUTROABS 9.7*  --   --   --   --   HGB 11.5* 10.5* 11.3* 9.3* 9.7*  HCT 36.9 34.2* 36.5 29.2* 31.1*  MCV 92.0 92.2 91.3 89.6 89.9  PLT PLATELET CLUMPS NOTED ON SMEAR, COUNT APPEARS ADEQUATE 241 289 287 347   Cardiac Enzymes: No results for input(s): CKTOTAL, CKMB, CKMBINDEX, TROPONINI in the last 168 hours. BNP (last 3 results)  Recent Labs  07/11/14 2328 07/15/14 0413  BNP 299.6* 359.3*    ProBNP (last 3 results)  Recent Labs  07/25/13 1215  PROBNP 4206.0*    CBG: No results for input(s): GLUCAP in the last 168 hours.  Recent Results (from the past 240 hour(s))  Urine culture     Status: None   Collection Time: 07/12/14  2:25  AM  Result Value Ref Range Status   Specimen Description URINE, CATHETERIZED  Final   Special Requests Normal  Final   Colony Count NO GROWTH Performed at [facility]   Final   Culture NO GROWTH [facility]formed at [facility]   Final   Report Status 07/13/2014 FINAL  Final  Surgical pcr screen     Status: None   Collection Time: 07/12/14 12:54 PM  Result Value Ref Range Status   MRSA, PCR NEGATIVE NEGATIVE Final   Staphylococcus aureus NEGATIVE NEGATIVE Final    Comment:        The Xpert SA Assay (FDA approved for NASAL specimens in patients over 521 years of age), is one component of a comprehensive surveillance program.  Test performance has been validated by [facility] for patients greater than or equal to 79 year old. It is not intended to diagnose infection nor to guide or monitor treatment.      Studies: Dg Chest Port 1 View  07/19/2014   CLINICAL DATA:  Respiratory failure. History of atrial fibrillation, hypertension and Coronary artery disease.  EXAM: PORTABLE CHEST - 1 VIEW  COMPARISON:  07/16/2014  FINDINGS: Left chest wall pacer device is noted with lead in the  right atrial appendage and right ventricle. Previous hardware fixation of the proximal left humerus. The heart size is moderately enlarged. Diffuse coarsened interstitial markings are identified bilaterally. Superimposed interstitial and airspace opacities in both lungs appear unchanged from previous exam.  IMPRESSION: 1. Persistent bilateral interstitial and airspace opacities, likely due to edema.   Electronically Signed   By: Signa Kellaylor  Stroud M.D.   On: 07/19/2014 08:36    Scheduled Meds: . acetaminophen  1,000 mg Oral 3 times per day   Or  . acetaminophen  650 mg Rectal 3 times per day  . antiseptic oral rinse  7 mL Mouth Rinse BID  . aspirin EC  325 mg Oral Q breakfast  . atenolol  25 mg Oral BID  . buPROPion  300 mg Oral Daily  . docusate sodium  100 mg Oral BID  . furosemide  80 mg Oral Daily  . levothyroxine  88 mcg Oral QAC breakfast  . pantoprazole  40 mg Oral QHS  . potassium chloride  40 mEq Oral Q4H  . senna  1 tablet Oral BID  . sorbitol, milk of mag, mineral oil, glycerin (SMOG) enema  960 mL Rectal Once   Continuous Infusions: . lactated ringers 10 mL/hr at 07/20/14 0800    Principal Problem:   Acute on chronic respiratory failure with hypoxia Active Problems:   Closed left hip fracture   Acute on chronic diastolic heart failure   Atrial fibrillation   Elevated troponin   Essential hypertension   Chronic diastolic heart failure   Chronic kidney disease, stage III (moderate)   Femur fracture, left   Hypokalemia   Pulmonary hypertension   Intertrochanteric fracture of left femur   Dyspnea   Constipation    Time spent: 40 minutes    Dani Wallner M.D. [facility] Pager [phone]. If 7PM-7AM, please contact night-coverage at [email or web address], password [email or web address] 07/20/2014, 8:30 AM  LOS: 8 days

## 2014-07-20 NOTE — Progress Notes (Signed)
Subjective: 3 Days Post-Op Procedure(s) (LRB): INTRAMEDULLARY (IM) NAIL FEMORAL (Left) Patient reports pain as mild to left hip. Well controlled on medications.  Tolerating PO's well. No N/V.  Denies SOB, CP, or calf pain.  Objective: Vital signs in last 24 hours: Temp:  [97.6 F (36.4 C)-98.6 F (37 C)] 98.6 F (37 C) (03/13 0400) Pulse Rate:  [69-93] 84 (03/13 0800) Resp:  [11-30] 19 (03/13 0800) BP: (99-147)/(41-97) 132/97 mmHg (03/13 0800) SpO2:  [87 %-98 %] 91 % (03/13 0800) Weight:  [60.8 kg (134 lb 0.6 oz)] 60.8 kg (134 lb 0.6 oz) (03/13 0500)  Intake/Output from previous day: 03/12 0701 - 03/13 0700 In: 1220 [P.O.:420; I.V.:800] Out: 2250 [Urine:2250] Intake/Output this shift: Total I/O In: 10 [I.V.:10] Out: 1025 [Urine:1025]   Recent Labs  07/18/14 0430 07/19/14 0405 07/20/14 0412  HGB 11.3* 9.3* 9.7*    Recent Labs  07/19/14 0405 07/20/14 0412  WBC 11.7* 11.5*  RBC 3.26* 3.46*  HCT 29.2* 31.1*  PLT 287 347    Recent Labs  07/19/14 0405 07/20/14 0412  NA 136 142  K 2.9* 3.0*  CL 96 97  CO2 33* 31  BUN 87* 81*  CREATININE 1.85* 1.59*  GLUCOSE 101* 91  CALCIUM 8.5 8.9   No results for input(s): LABPT, INR in the last 72 hours.  Alert and oriented x3. RRR, Lungs clear, BS x4. Left Calf soft and non tender. Hip dressing C/D/I. No DVT signs. No signs of infection or compartment syndrome. LLE neurovascularly intact. Left plantar and dorsi flexion intact.   Assessment/Plan: 3 Days Post-Op Procedure(s) (LRB): INTRAMEDULLARY (IM) NAIL FEMORAL (Left) Up with PT WBAT with Walker Possible transfer to floor today D/c to SNF when ready per Medicine , ok per Ortho. Continue other current care  Bern Fare L 07/20/2014, 8:26 AM

## 2014-07-21 LAB — CBC
HEMATOCRIT: 33 % — AB (ref 36.0–46.0)
HEMOGLOBIN: 9.9 g/dL — AB (ref 12.0–15.0)
MCH: 27.4 pg (ref 26.0–34.0)
MCHC: 30 g/dL (ref 30.0–36.0)
MCV: 91.4 fL (ref 78.0–100.0)
Platelets: 394 10*3/uL (ref 150–400)
RBC: 3.61 MIL/uL — AB (ref 3.87–5.11)
RDW: 17.2 % — ABNORMAL HIGH (ref 11.5–15.5)
WBC: 13 10*3/uL — AB (ref 4.0–10.5)

## 2014-07-21 LAB — BASIC METABOLIC PANEL
ANION GAP: 12 (ref 5–15)
BUN: 79 mg/dL — ABNORMAL HIGH (ref 6–23)
CO2: 35 mmol/L — ABNORMAL HIGH (ref 19–32)
CREATININE: 1.73 mg/dL — AB (ref 0.50–1.10)
Calcium: 9.4 mg/dL (ref 8.4–10.5)
Chloride: 97 mmol/L (ref 96–112)
GFR calc Af Amer: 28 mL/min — ABNORMAL LOW (ref >90)
GFR calc non Af Amer: 24 mL/min — ABNORMAL LOW (ref >90)
Glucose, Bld: 97 mg/dL (ref 70–99)
Potassium: 3.4 mmol/L — ABNORMAL LOW (ref 3.5–5.1)
SODIUM: 144 mmol/L (ref 135–145)

## 2014-07-21 MED ORDER — POTASSIUM CHLORIDE CRYS ER 20 MEQ PO TBCR
40.0000 meq | EXTENDED_RELEASE_TABLET | Freq: Every day | ORAL | Status: DC
  Administered 2014-07-21 – 2014-07-22 (×2): 40 meq via ORAL
  Filled 2014-07-21 (×2): qty 2

## 2014-07-21 MED ORDER — METHOCARBAMOL 500 MG PO TABS
500.0000 mg | ORAL_TABLET | Freq: Four times a day (QID) | ORAL | Status: DC | PRN
  Administered 2014-07-21 – 2014-07-22 (×3): 500 mg via ORAL
  Filled 2014-07-21 (×3): qty 1

## 2014-07-21 NOTE — Progress Notes (Signed)
SUBJECTIVE:  No complaints of SOB or chest pain  OBJECTIVE:   Vitals:   Filed Vitals:   07/20/14 1600 07/20/14 1800 07/20/14 2033 07/21/14 0545  BP: 123/74 140/63 138/72 132/63  Pulse: 80 90 95 90  Temp:   97.5 F (36.4 C) 97.5 F (36.4 C)  TempSrc:   Oral Oral  Resp: Height:    (1.651 m)   Weight:   124 lb 1.9 oz (56.3 kg) 124 lb 1.9 oz (56.3 kg)  SpO2: 94% 96% 95% 96%   I&O's:   Intake/Output Summary (Last 24 hours) at 07/21/14 1052 Last data filed at 07/21/14 0853  Gross per 24 hour  Intake    800 ml  Output   1330 ml  Net   -530 ml   TELEMETRY: Reviewed telemetry pt in atrial fibrillation:     PHYSICAL EXAM General: Well developed, well nourished, in no acute distress Head: Eyes PERRLA, No xanthomas.   Normal cephalic and atramatic  Lungs:   Clear bilaterally to auscultation and percussion. Heart:   Irregularly irregular S1 S2 Pulses are 2+ & equal. Abdomen: Bowel sounds are positive, abdomen soft and non-tender without masses  Extremities:   No clubbing, cyanosis or edema.  DP +1 Neuro: Alert and oriented X 3. Psych:  Good affect, responds appropriately   LABS: Basic Metabolic Panel:  Recent Labs  16/10/96 0918 07/20/14 0412 07/21/14 0415  NA  --  142 144  K  --  3.0* 3.4*  CL  --  97 97  CO2  --  31 35*  GLUCOSE  --  91 97  BUN  --  81* 79*  CREATININE  --  1.59* 1.73*  CALCIUM  --  8.9 9.4  MG 2.4  --   --    Liver Function Tests: No results for input(s): AST, ALT, ALKPHOS, BILITOT, PROT, ALBUMIN in the last 72 hours. No results for input(s): LIPASE, AMYLASE in the last 72 hours. CBC:  Recent Labs  07/20/14 0412 07/21/14 0415  WBC 11.5* 13.0*  HGB 9.7* 9.9*  HCT 31.1* 33.0*  MCV 89.9 91.4  PLT 347 394   Cardiac Enzymes: No results for input(s): CKTOTAL, CKMB, CKMBINDEX, TROPONINI in the last 72 hours. BNP: Invalid input(s): POCBNP D-Dimer: No results for input(s): DDIMER in the last 72 hours. Hemoglobin  A1C: No results for input(s): HGBA1C in the last 72 hours. Fasting Lipid Panel: No results for input(s): CHOL, HDL, LDLCALC, TRIG, CHOLHDL, LDLDIRECT in the last 72 hours. Thyroid Function Tests: No results for input(s): TSH, T4TOTAL, T3FREE, THYROIDAB in the last 72 hours.  Invalid input(s): FREET3 Anemia Panel:  Recent Labs  07/19/14 0918  VITAMINB12 536  FOLATE 12.8  FERRITIN 249  TIBC 218*  IRON 17*   Coag Panel:   Lab Results  Component Value Date   INR 1.10 07/11/2014   INR 1.12 04/29/2010   INR 1.1 11/21/2006    RADIOLOGY: Dg Chest 1 View  07/11/2014   CLINICAL DATA:  Status post fall. Respiratory failure. Initial encounter.  EXAM: CHEST  1 VIEW  COMPARISON:  Chest radiograph performed 07/25/2013  FINDINGS: The lungs are well-aerated. Pulmonary vascularity is at the upper limits of normal. Densities at the lung bases reflect overlying costochondral calcification. There is no evidence of focal opacification, pleural effusion or pneumothorax.  The cardiomediastinal silhouette is borderline enlarged. A pacemaker is noted overlying the left chest wall, with leads ending overlying the right atrium and right  ventricle. No acute osseous abnormalities are seen. Left proximal humeral hardware is grossly unremarkable, though incompletely imaged.  IMPRESSION: No acute cardiopulmonary process seen; borderline cardiomegaly. No displaced rib fractures identified.   Electronically Signed   By: Roanna Raider M.D.   On: 07/11/2014 22:55   Pelvis Portable  07/18/2014   CLINICAL DATA:  Post ORIF.  EXAM: PORTABLE PELVIS 1-2 VIEWS  COMPARISON:  Left femur radiographs 07/12/2014  FINDINGS: Intra medullary rod with locking screw traversing left intertrochanteric femur fracture. Alignment is improved. Recent postsurgical change includes air in the soft tissues. Right hip arthroplasty in place. Examination is limited by positioning.  IMPRESSION: Intra medullary rod with proximal locking screw traversing  left intertrochanteric femur fracture. No immediate postoperative complication.   Electronically Signed   By: Rubye Oaks M.D.   On: 07/18/2014 06:02   Dg Chest Port 1 View  07/19/2014   CLINICAL DATA:  Respiratory failure. History of atrial fibrillation, hypertension and Coronary artery disease.  EXAM: PORTABLE CHEST - 1 VIEW  COMPARISON:  07/16/2014  FINDINGS: Left chest wall pacer device is noted with lead in the right atrial appendage and right ventricle. Previous hardware fixation of the proximal left humerus. The heart size is moderately enlarged. Diffuse coarsened interstitial markings are identified bilaterally. Superimposed interstitial and airspace opacities in both lungs appear unchanged from previous exam.  IMPRESSION: 1. Persistent bilateral interstitial and airspace opacities, likely due to edema.   Electronically Signed   By: Signa Kell M.D.   On: 07/19/2014 08:36   Dg Chest Port 1 View  07/16/2014   CLINICAL DATA:  Shortness of breath, hypertension.  EXAM: PORTABLE CHEST - 1 VIEW  COMPARISON:  07/15/2014  FINDINGS: Left pacer is in place with leads in the right atrium and right ventricle, unchanged. Cardiomegaly. Persistent interstitial and alveolar opacities throughout the lungs, not significantly changed, likely edema. Pneumonia cannot be completely excluded. No visible effusions. No acute bony abnormality.  IMPRESSION: Persistent bilateral interstitial and alveolar opacities, edema versus infection. No real change.   Electronically Signed   By: Charlett Nose M.D.   On: 07/16/2014 08:34   Dg Chest Port 1 View  07/15/2014   CLINICAL DATA:  Shortness of breath.  EXAM: PORTABLE CHEST - 1 VIEW  COMPARISON:  07/13/2014.  FINDINGS: Mediastinum and hilar structures are normal. Cardiac pacer stable position. Stable cardiomegaly. Stable diffuse from interstitial prominence. Congestive heart failure could present in this fashion. Pneumonitis cannot be excluded. No pleural effusion or  pneumothorax.  IMPRESSION: Persistent cardiomegaly with bilateral pulmonary interstitial prominence suggesting congestive heart failure. Pneumonitis cannot be excluded.   Electronically Signed   By: Maisie Fus  Register   On: 07/15/2014 07:19   Dg Chest Port 1 View  07/13/2014   CLINICAL DATA:  Shortness of breath. Congestive heart failure. Weakness. Left hip fracture. Pre-op respiratory exam  EXAM: PORTABLE CHEST - 1 VIEW  COMPARISON:  07/11/2014  FINDINGS: Cardiomegaly stable. Increased diffuse interstitial infiltrates are seen, consistent with mild interstitial edema. No evidence of focal pulmonary consolidation or pleural effusion. Transvenous pacemaker remains in appropriate position.  IMPRESSION: Increased diffuse interstitial infiltrates, suspicious for interstitial edema. Stable cardiomegaly.   Electronically Signed   By: Myles Rosenthal M.D.   On: 07/13/2014 10:32   Dg C-arm 1-60 Min-no Report  07/17/2014   CLINICAL DATA:  Intertrochanteric left femur fracture fixation. Initial encounter.  EXAM: DG C-ARM 1-60 MIN - NRPT [facility]; LEFT FEMUR 2 VIEWS  COMPARISON:  Radiographs 07/12/2014.  FLUOROSCOPY TIME: C-arm fluoroscopic  images were obtained intraoperatively and submitted for post operative interpretation. Please see the performing provider's procedural report for the fluoroscopy time utilized.  FINDINGS: Four spot fluoroscopic images of the left femur demonstrate intramedullary rod and dynamic screw fixation of the comminuted intertrochanteric femur fracture. There is a distal interlocking screw. The main fracture fragments demonstrate near anatomic alignment. No demonstrated complication.  IMPRESSION: ORIF of intertrochanteric left femur fracture. No demonstrated complication.   Electronically Signed   By: Carey BullocksWilliam  Veazey M.D.   On: 07/17/2014 22:27   Dg Hip Unilat With Pelvis 2-3 Views Left  07/11/2014   CLINICAL DATA:  Fall this evening with left hip pain.  EXAM: LEFT HIP (WITH PELVIS) 2-3 VIEWS   COMPARISON:  None.  FINDINGS: Right hip arthroplasty. Lower lumbar spondylosis. Vascular calcifications.  Comminuted intratrochanteric left femur fracture.  No dislocation  IMPRESSION: Comminuted intratrochanteric left femur fracture.   Electronically Signed   By: Jeronimo GreavesKyle  Talbot M.D.   On: 07/11/2014 22:45   Dg Femur Min 2 Views Left  07/18/2014   CLINICAL DATA:  Post ORIF.  EXAM: LEFT FEMUR 2 VIEWS  COMPARISON:  07/12/2014  FINDINGS: Intra medullary rod with proximal and distal locking screws traverse left hip intertrochanteric femur fracture. There is improved alignment. Displaced fragment involving the lesser trochanter is again seen. Recent postsurgical change includes air in the adjacent soft tissues.  IMPRESSION: Post ORIF left intertrochanteric femur fracture without immediate postoperative complication.   Electronically Signed   By: Rubye OaksMelanie  Ehinger M.D.   On: 07/18/2014 06:04   Dg Femur Min 2 Views Left  07/17/2014   CLINICAL DATA:  Intertrochanteric left femur fracture fixation. Initial encounter.  EXAM: DG C-ARM 1-60 MIN - NRPT [facility]; LEFT FEMUR 2 VIEWS  COMPARISON:  Radiographs 07/12/2014.  FLUOROSCOPY TIME: C-arm fluoroscopic images were obtained intraoperatively and submitted for post operative interpretation. Please see the performing provider's procedural report for the fluoroscopy time utilized.  FINDINGS: Four spot fluoroscopic images of the left femur demonstrate intramedullary rod and dynamic screw fixation of the comminuted intertrochanteric femur fracture. There is a distal interlocking screw. The main fracture fragments demonstrate near anatomic alignment. No demonstrated complication.  IMPRESSION: ORIF of intertrochanteric left femur fracture. No demonstrated complication.   Electronically Signed   By: Carey BullocksWilliam  Veazey M.D.   On: 07/17/2014 22:27   Dg Femur Min 2 Views Left  07/12/2014   CLINICAL DATA:  Fall yesterday with left hip injury and pain. Subsequent encounter.  EXAM: LEFT FEMUR  2 VIEWS  COMPARISON:  07/11/2014  FINDINGS: A comminuted intertrochanteric left femur fracture is noted with varus angulation.  There is no evidence of subluxation or dislocation.  No other fractures are identified.  The mid and distal femur are unremarkable.  No focal bony lesions are identified.  IMPRESSION: Comminuted intertrochanteric left femur fracture with varus angulation.   Electronically Signed   By: Harmon PierJeffrey  Hu M.D.   On: 07/12/2014 17:02    Assessment/Plan:  1. Acute on chronic diastolic heart failure continues compensated, good diuresis overnight and no weight gain. She is net neg 8.4L.  She is down 15 lbs.  Stable on PO lasix. 2. Hypokalemia need of replacement 3. Chronic atrial fibrillation rate controlled - not candidate for anticoagulation 4. Chronic respiratory failure on oxygen 5.  Elevated trop felt to be demand ischemia - no ischemic eval planned  No new recs.  Appears euvolemic.  Will sign off.  Call with any questions.  Quintella ReichertURNER,TRACI R, MD  07/21/2014  10:52 AM

## 2014-07-21 NOTE — Progress Notes (Signed)
[facility] PROGRESS NOTE  [name]:[id number] DOB: October 27, 1919 DOA: 07/11/2014 PCP: Danielle Herter, MD  Brief narrative:    81 y.o. 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 Due to multiple co morbid conditions, cariology for consulted for pre op clearance prior to surgery.   Events since admission: 3/4 - admitted to telemetry unit, trop elevated 0.11 3/5 - persistently hypoxic with O2 sat in 80's on 6 L [place] and up to low 90's with venti mask, transferred to SDU   patient with some increased O2 requirements was seen in consultation by [facility] M was continued on IV Lasix and started on Zaroxolyn. Patient with good diuresis. Patient subsequently went to the OR and underwent IM nail to the left femur. Patient tolerated procedure well. Patient weaning down on O2 sats. Zaroxolyn discontinued. Patient with good diuresis. Patient currently on 3 L nasal cannula with sats of 91-93% which is her baseline . Patient to be transferred to telemetry 07/20/2014       Assessment/Plan: #1 acute on chronic respiratory failure Likely multifactorial secondary to acute on chronic diastolic heart failure in the setting of atrial fibrillation, chronic COPD on home O2, pulmonary hypertension. Clinical improvement. On 3L [place] with sats 96%.  Chest x-ray was consistent with volume overload a few days ago. Patient is -8.8L since admission 07/12/14. Current weight at  124 lbs from 134 lbs from 136 lbs from 134lb from 137 pounds from 138 pounds. Troponins were elevated however felt to be secondary to demand ischemia. 2-D echo with EF of 55%. Continue oral  Lasix per cardiology. Continue atenolol. D/C Zaroxyln. Monitor oxygen requirements. Cardiology following and appreciate input and recommendations.  #2 acute on chronic diastolic heart failure Patient with elevated troponins felt to be secondary to a demand ischemia. 2-D echo with EF of 55%. Patient with good diuresis and is -.8.8 L since admission 07/12/14. Current weight 124 lbs from134b lbs from 136 lbs from 134lbs from 137 LBs from 138 pounds. Continue oral Lasix per cardiology. Continue beta blocker. D/C'd Zaroxyln . Cardiology following and appreciate input and recommendations.  #3 left intertrochanteric hip fracture Patient is being seen by orthopedics. Patient s/p Nail.  Patient currently on 3L [place] with sats 91-92 %, her baseline. Lasix has been changed to PO per cardiology. PT/OT. Ortho ff.   #4 hypothyroidism Continue home dose Synthroid.  #5 atrial fibrillation Continue atenolol for rate control. Patient not deemed a long-term anticoagulation candidate secondary to prior bleeding on Coumadin. Per cardiology.  #6 hypokalemia Likely secondary to diuresis. Replete.  #7 elevated troponin Felt to be secondary to demand ischemia. Per cardiology no ischemic workup planned at this time.  #8 COPD Stable. Nebs as needed.  #9 hypertension Stable. On atenolol. Monitor on Lasix.  #10 chronic kidney disease stage IV Currently stable. Baseline creatinine is 2.1-2.3. Monitor closely with diuresis. Renal function actually has improved.   #11 Anemia Likely postop anemia. No overt bleeding. H/H stable.   #12 severe protein calorie malnutrition Continue current diet. Follow.  #13 prophylaxis SCDs for DVT prophylaxis.  Code Status: Full Family Communication: Updated patient and daughter [name]  at bedside. Disposition Plan: To SNF tomorrow if stable.   Consultants:  Orthopedics: Dr. supple 07/12/2014  Cardiology: Dr. Ace Gins 07/12/2014  PCCM Dr Kendrick Fries 07/15/14  Procedures:  Chest  x-ray 07/11/2014, 07/13/2014, 07/19/14  2-D echo 07/12/2014  X-ray of the left femur 07/12/2014  X-ray of the left hip with pelvis 07/11/2014  Cephalomedullary nail placement, left pertrochanteric femur 07/17/14  Antibiotics:  None  HPI/Subjective: Denies CP. Feels breathing is improving.   Objective: Filed Vitals:   07/21/14 1411  BP: 100/50  Pulse:   Temp:   Resp:     Intake/Output Summary (Last 24 hours) at 07/21/14 1449 Last data filed at 07/21/14 1331  Gross per 24 hour  Intake    520 ml  Output   1305 ml  Net   -785 ml   Filed Weights   07/20/14 0500 07/20/14 2033 07/21/14 0545  Weight: 60.8 kg (134 lb 0.6 oz) 56.3 kg (124 lb 1.9 oz) 56.3 kg (124 lb 1.9 oz)    Exam:   General:  On NRB  Cardiovascular: Irregularly irregular.  Respiratory: CTAB anterior lung fields.  Abdomen: Soft, nontender, nondistended, positive bowel sounds.  Musculoskeletal: No clubbing cyanosis or edema. Left lower extremity with some TTP.  Data Reviewed: Basic Metabolic Panel:  Recent Labs Lab 07/18/14 0430 07/18/14 1450 07/19/14 0405 07/19/14 0918 07/20/14 0412 07/21/14 0415  NA 143 137 136  --  142 144  K 2.7* 3.2* 2.9*  --  3.0* 3.4*  CL 99 94* 96  --  97 97  CO2 33* 29 33*  --  31 35*  GLUCOSE 99 130* 101*  --  91 97  BUN 70* 81* 87*  --  81* 79*  CREATININE 1.69* 2.05* 1.85*  --  1.59* 1.73*  CALCIUM 8.4 8.2* 8.5  --  8.9 9.4  MG  --   --   --  2.4  --   --    Liver Function Tests: No results for input(s): AST, ALT, ALKPHOS, BILITOT, PROT, ALBUMIN in the last 168 hours. No results for input(s): LIPASE, AMYLASE in the last 168 hours. No results for input(s): AMMONIA in the last 168 hours. CBC:  Recent Labs Lab 07/16/14 0400 07/17/14 0415 07/18/14 0430 07/19/14 0405 07/20/14 0412 07/21/14 0415  WBC 12.8* 14.2* 17.5* 11.7* 11.5* 13.0*  NEUTROABS 9.7*  --   --   --   --   --   HGB 11.5* 10.5* 11.3* 9.3* 9.7* 9.9*  HCT 36.9 34.2* 36.5 29.2* 31.1* 33.0*   MCV 92.0 92.2 91.3 89.6 89.9 91.4  PLT PLATELET CLUMPS NOTED ON SMEAR, COUNT APPEARS ADEQUATE 241 289 287 347 394   Cardiac Enzymes: No results for input(s): CKTOTAL, CKMB, CKMBINDEX, TROPONINI in the last 168 hours. BNP (last 3 results)  Recent Labs  07/11/14 2328 07/15/14 0413  BNP 299.6* 359.3*    ProBNP (last 3 results)  Recent Labs  07/25/13 1215  PROBNP 4206.0*    CBG: No results for input(s): GLUCAP in the last 168 hours.  Recent Results (from the past 240 hour(s))  Urine culture     Status: None   Collection Time: 07/12/14  2:25 AM  Result Value Ref Range Status   Specimen Description URINE, CATHETERIZED  Final   Special Requests Normal  Final   Colony Count NO GROWTH Performed at [facility]   Final   Culture NO GROWTH Performed at [facility]   Final   Report Status 07/13/2014 FINAL  Final  Surgical pcr screen     Status: None   Collection Time: 07/12/14 12:54 PM  Result Value Ref Range Status   MRSA, PCR NEGATIVE NEGATIVE Final   Staphylococcus aureus NEGATIVE NEGATIVE Final    Comment:        The Xpert SA Assay (FDA approved for NASAL specimens in patients over 79 years of age), is one component of a comprehensive surveillance program.  Test performance has been validated by [facility] for patients greater than or equal to 79 year old. It is not intended to diagnose infection nor to guide or monitor treatment.      Studies: No results found.  Scheduled Meds: . acetaminophen  1,000 mg Oral 3 times per day   Or  . acetaminophen  650 mg Rectal 3 times per day  . antiseptic oral rinse  7 mL Mouth Rinse BID  . aspirin EC  325 mg Oral Q breakfast  . atenolol  25 mg Oral BID  . buPROPion  300 mg Oral Daily  . furosemide  80 mg Oral Daily  . levothyroxine  88 mcg Oral QAC breakfast  . pantoprazole  40 mg Oral QHS  . potassium chloride  40 mEq Oral Daily  . senna  1 tablet Oral Daily  . sorbitol, milk of mag, mineral  oil, glycerin (SMOG) enema  960 mL Rectal Once   Continuous Infusions: . lactated ringers 10 mL/hr at 07/20/14 0800    Principal Problem:   Acute on chronic respiratory failure with hypoxia Active Problems:   Closed left hip fracture   Acute on chronic diastolic heart failure   Atrial fibrillation   Elevated troponin   Essential hypertension   Chronic diastolic heart failure   Chronic kidney disease, stage III (moderate)   Femur fracture, left   Hypokalemia   Pulmonary hypertension   Intertrochanteric fracture of left femur   Dyspnea   Constipation   Femur fracture    Time spent: 40 minutes    Ramal Eckhardt M.D. [facility] Pager [phone]. If 7PM-7AM, please contact night-coverage at [email or web address], password [facility] 07/21/2014, 2:49 PM  LOS: 9 days

## 2014-07-21 NOTE — Progress Notes (Signed)
   Subjective:  Patient reports pain as mild.  States pain is controlled. Denies N/V/CP/SOB.  Objective:   VITALS:   Filed Vitals:   07/20/14 1600 07/20/14 1800 07/20/14 2033 07/21/14 0545  BP: 123/74 140/63 138/72 132/63  Pulse: 80 90 95 90  Temp:   97.5 F (36.4 C) 97.5 F (36.4 C)  TempSrc:   Oral Oral  Resp: 16 14 19 18   Height:   5\' 5"  (1.651 m)   Weight:   56.3 kg (124 lb 1.9 oz) 56.3 kg (124 lb 1.9 oz)  SpO2: 94% 96% 95% 96%    ABD soft Sensation intact distally Intact pulses distally Dorsiflexion/Plantar flexion intact Incision: dressing C/D/I   Lab Results  Component Value Date   WBC 13.0* 07/21/2014   HGB 9.9* 07/21/2014   HCT 33.0* 07/21/2014   MCV 91.4 07/21/2014   PLT 394 07/21/2014   BMET    Component Value Date/Time   NA 144 07/21/2014 0415   NA 137 04/10/2013 1527   K 3.4* 07/21/2014 0415   CL 97 07/21/2014 0415   CO2 35* 07/21/2014 0415   GLUCOSE 97 07/21/2014 0415   GLUCOSE 94 04/10/2013 1527   BUN 79* 07/21/2014 0415   BUN 28 04/10/2013 1527   CREATININE 1.73* 07/21/2014 0415   CREATININE 1.65* 08/06/2013 1508   CALCIUM 9.4 07/21/2014 0415   GFRNONAA 24* 07/21/2014 0415   GFRAA 28* 07/21/2014 0415     Assessment/Plan: 4 Days Post-Op   Principal Problem:   Acute on chronic respiratory failure with hypoxia Active Problems:   Atrial fibrillation   Elevated troponin   Essential hypertension   Chronic diastolic heart failure   Chronic kidney disease, stage III (moderate)   Femur fracture, left   Closed left hip fracture   Hypokalemia   Pulmonary hypertension   Acute on chronic diastolic heart failure   Intertrochanteric fracture of left femur   Dyspnea   Constipation   Femur fracture  WBAT with walker PO pain control, avoid narcotics DVT ppx: SCDs, TEDs, ASA due to risk of bleeding Up with therapy Discharge to SNF    [name], Danielle ReamsBrian James 07/21/2014, 7:45 AM   Danielle FredericBrian Willette Mudry, MD Cell [phone]

## 2014-07-21 NOTE — Care Management Note (Addendum)
    Page 1 of 2   07/22/2014     11:08:22 AM CARE MANAGEMENT NOTE 07/22/2014  Patient:  Danielle DuckCUMMINGS,Kerington B   Account Number:  [id number]  Date Initiated:  07/15/2014  Documentation initiated by:  DAVIS,RHONDA  Subjective/Objective Assessment:   81 year old female admitted on 07/12/2014 for a left hip fracture, pulmonary and crit of care medicine consultation for ongoing hypoxemic respiratory failure on 07/15/2014.     Action/Plan:   snf when stable   Anticipated DC Date:  07/22/2014   Anticipated DC Plan:  SKILLED NURSING FACILITY  In-house referral  Clinical Social Worker      DC Planning Services  CM consult      [facility]  NA   Choice offered to / List presented to:  NA   DME arranged  NA      DME agency  NA     HH arranged  NA      HH agency  NA   Status of service:  Completed, signed off Medicare Important Message given?  YES (If response is "NO", the following Medicare IM given date fields will be blank) Date Medicare IM given:  07/21/2014 Medicare IM given by:  [facility],Shontelle Muska Date Additional Medicare IM given:   Additional Medicare IM given by:    Discharge Disposition:  SKILLED NURSING FACILITY  Per UR Regulation:  Reviewed for med. necessity/level of care/duration of stay  If discussed at Long Length of Stay Meetings, dates discussed:   07/22/2014    Comments:  07/22/14 Lanier ClamKathy Dorr Perrot RN BSN NCM [phone] d/c snf.  07/21/14 Lanier ClamKathy Shloima Clinch RN BSN NCM [phone] PT-SNF.d/c plan snf when medically stable.  July 18, 2014/Rhonda L. Earlene Plateravis, RN, BSN, CCM. Case Management [employer] [phone] No discharge needs present of time of review. continues to require high level of 02 on nrb face mask  July 15, 2014/Rhonda L. Earlene Plateravis, RN, BSN, CCM. Case Management [employer] [phone] No discharge needs present of time of review.

## 2014-07-21 NOTE — Progress Notes (Signed)
Physical Therapy Treatment Patient Details Name: [name] MRN: [id number] DOB: [birth year] Today's Date: 07/21/2014    History of Present Illness 81 yo female s/p L IM nail femur 07/17/14. Hx of vertigo, HTN, Afib, osteoporosis, Aflutter, CHF, tachybrady syndrome, pacemaker, O2 dependent.     PT Comments    RN called this am to arrange Tx time after pain meds given to optimize mobility.  Assisted pt to EOB required + 2 assist and increased time.  [facility] with great difficulty supporting self and even greater difficulty completing turns.  BSC pulled up to pt from behind.  Required + 2 [facility] by switching out from behind as pt was unable to even step forward.  Positioned in recliner then performed AAROM TE's L LE.   Follow Up Recommendations  SNF     Equipment Recommendations       Recommendations for Other Services       Precautions / Restrictions Precautions Precautions: Fall Restrictions Weight Bearing Restrictions: No LLE Weight Bearing: Weight bearing as tolerated    Mobility  Bed Mobility Overal bed mobility: Needs Assistance Bed Mobility: Supine to Sit     Supine to sit: Max assist;+2 for physical assistance     General bed mobility comments: increased time and assist with B LE's off bed then used bed pad to scoot hips such that feet on fllor.  Pt in obviuos distress even premedicated for pain prior to session.   Transfers Overall transfer level: Needs assistance Equipment used: Rolling walker (2 wheeled)   Sit to Stand: Max assist;+2 physical assistance;+2 safety/equipment         General transfer comment: 75% VC's on proper tech, hand placement.  Great difficulty standing on L LE and even greater difficulty turning 1/4 pivot fron bed [facility].  Increased pain and anxiety noted/fear of falling  Ambulation/Gait             General Gait Details: NT-pt unable due to pain and poor  transfer level/ability   Stairs            Wheelchair Mobility    Modified Rankin (Stroke Patients Only)       Balance                                    Cognition Arousal/Alertness: Awake/alert Behavior During Therapy: WFL for tasks assessed/performed Overall Cognitive Status: Within Functional Limits for tasks assessed                      Exercises   L Hip AAROM TE's 10 reps ankle pumps 10 reps knee presses 10 reps heel slides 10 reps SAQ's 10 reps ABD Followed by ICE  Educated Care Giver on TE's    General Comments        Pertinent Vitals/Pain Pain Assessment: Faces Faces Pain Scale: Hurts whole lot Pain Location: L LE high on hip then traveling to inner groin Pain Descriptors / Indicators: Shooting;Sore;Constant Pain Intervention(s): Premedicated before session;Repositioned    Home Living                      Prior Function            PT Goals (current goals can now be found in the care plan section) Progress towards PT goals: Progressing toward goals  Frequency  Min 3X/week    PT Plan      Co-evaluation             End of Session Equipment Utilized During Treatment: Oxygen Activity Tolerance: Patient limited by pain;Patient limited by fatigue Patient left: in chair;with call bell/phone within reach     Time: 1235-1300 PT Time Calculation (min) (ACUTE ONLY): 25 min  Charges:  $Therapeutic Exercise: 8-22 mins $Therapeutic Activity: 8-22 mins                    G Codes:      Felecia Shelling  PTA [facility]  Acute  Rehab Pager      [phone]

## 2014-07-21 NOTE — Progress Notes (Signed)
OT Cancellation Note  Patient Details Name: [name] MRN: [id number] DOB: [birth year]   Cancelled Treatment:    Noted plan for SNF- will defer to SNF  [facility], [place] KungLorraine D 07/21/2014, 2:28 PM

## 2014-07-22 DIAGNOSIS — S72142S Displaced intertrochanteric fracture of left femur, sequela: Secondary | ICD-10-CM

## 2014-07-22 LAB — BASIC METABOLIC PANEL
Anion gap: 14 (ref 5–15)
BUN: 75 mg/dL — ABNORMAL HIGH (ref 6–23)
CALCIUM: 9.4 mg/dL (ref 8.4–10.5)
CO2: 33 mmol/L — ABNORMAL HIGH (ref 19–32)
CREATININE: 1.71 mg/dL — AB (ref 0.50–1.10)
Chloride: 97 mmol/L (ref 96–112)
GFR calc non Af Amer: 25 mL/min — ABNORMAL LOW (ref >90)
GFR, EST AFRICAN AMERICAN: 29 mL/min — AB (ref >90)
Glucose, Bld: 85 mg/dL (ref 70–99)
Potassium: 3.2 mmol/L — ABNORMAL LOW (ref 3.5–5.1)
Sodium: 144 mmol/L (ref 135–145)

## 2014-07-22 MED ORDER — POTASSIUM CHLORIDE CRYS ER 20 MEQ PO TBCR: 40.0000 meq | EXTENDED_RELEASE_TABLET | Freq: Once | ORAL | Status: DC

## 2014-07-22 MED ORDER — SENNA 8.6 MG PO TABS: 1.0000 | ORAL_TABLET | Freq: Every day | ORAL | Status: DC

## 2014-07-22 MED ORDER — METOLAZONE 2.5 MG PO TABS: 2.5000 mg | ORAL_TABLET | ORAL | Status: AC

## 2014-07-22 MED ORDER — FUROSEMIDE 80 MG PO TABS: ORAL_TABLET | ORAL | Status: DC

## 2014-07-22 MED ORDER — POTASSIUM CHLORIDE CRYS ER 20 MEQ PO TBCR: 40.0000 meq | EXTENDED_RELEASE_TABLET | Freq: Every day | ORAL | Status: DC

## 2014-07-22 MED ORDER — METHOCARBAMOL 500 MG PO TABS: 500.0000 mg | ORAL_TABLET | Freq: Four times a day (QID) | ORAL | Status: DC | PRN

## 2014-07-22 MED ORDER — POTASSIUM CHLORIDE CRYS ER 20 MEQ PO TBCR
40.0000 meq | EXTENDED_RELEASE_TABLET | Freq: Once | ORAL | Status: AC
  Administered 2014-07-22: 40 meq via ORAL
  Filled 2014-07-22: qty 2

## 2014-07-22 NOTE — Progress Notes (Signed)
[facility]. Patient & daughter, [name] aware. Discharge packet given to RN, Myrna. [facility] called for transport to pickup at 1:00pm.   Clinical Social Work Department CLINICAL SOCIAL WORK PLACEMENT NOTE 07/22/2014  Patient:  [name],[name]  Account Number:  [id number] Admit date:  07/11/2014  Clinical Social Worker:  Elray Bubaegina Kujawa, CLINICAL SOCIAL WORKER  Date/time:  07/12/2014 01:47 PM  Clinical Social Work is seeking post-discharge placement for this patient at the following level of care:   SKILLED NURSING   (*CSW will update this form in Epic as items are completed)   07/12/2014  Patient/family provided with [facility] of Clinical Social Work's list of facilities offering this level of care within the geographic area requested by the patient (or if unable, by the patient's family).  07/12/2014  Patient/family informed of their freedom to choose among providers that offer the needed level of care, that participate in Medicare, Medicaid or managed care program needed by the patient, have an available bed and are willing to accept the patient.  07/12/2014  Patient/family informed of [facility]' ownership interest [facility], as well as of the fact that they are under no obligation to receive care at this facility.  PASARR submitted to EDS on 07/12/2014 PASARR number received on 07/12/2014  FL2 transmitted to all facilities in geographic area requested by pt/family on  07/12/2014 FL2 transmitted to all facilities within larger geographic area on   Patient informed that his/her managed care company has contracts with or will negotiate with  certain facilities, including the following:     Patient/family informed of bed offers received:  07/22/2014 [facility] Physician recommends and patient chooses bed at    [facility]  [facility]2014 Patient to be transferred  to facility by [facility] Patient and family notified of transfer on 07/22/2014 Name of family member notified:  patient's daughter, [name]  The following physician request were entered in Epic:   Additional Comments:     Lincoln MaxinKelly Jori Thrall, LCSW [facility] Clinical Social Worker cell #: [phone]

## 2014-07-22 NOTE — Progress Notes (Signed)
[facility]

## 2014-07-22 NOTE — Discharge Summary (Signed)
Physician Discharge Summary  [name] RUE:[id number] DOB: June 07, 1919 DOA: 07/11/2014  PCP: Carollee Herter, MD  Admit date: 07/11/2014 Discharge date: 07/22/2014  Time spent: 75 minutes  Recommendations for Outpatient Follow-up:  1. Patient is to follow-up with Dr. Linna Caprice of orthopedics in 2 weeks. 2. Patient is to follow-up with M.D. at skilled nursing facility. Patient need a basic metabolic profile done in 1 week to follow-up on electrolytes and renal function. Patient on need a CBC done also in 1 week to follow-up on H&H. Patient's dry weight is approximately 60 kg. If patient is to gain greater than 3 pounds per week May consider giving patient a dose of Zaroxolyn until she is back down to her baseline. Patient's daughter, [name] is very adept at managing her fluid status and may be beneficial to touch base with her while managing patient's fluid status.  3.  Follow-up with Dr. Verdis Prime of cardiology 2 weeks.   Discharge Diagnoses:  Principal Problem:   Acute on chronic respiratory failure with hypoxia Active Problems:   Closed left hip fracture   Acute on chronic diastolic heart failure   Atrial fibrillation   Elevated troponin   Essential hypertension   Chronic diastolic heart failure   Chronic kidney disease, stage III (moderate)   Femur fracture, left   Hypokalemia   Pulmonary hypertension   Intertrochanteric fracture of left femur   Dyspnea   Constipation   Femur fracture   Discharge Condition: Stable and improved  Diet recommendation: Heart healthy  Filed Weights   07/20/14 2033 07/21/14 0545 07/22/14 0611  Weight: 56.3 kg (124 lb 1.9 oz) 56.3 kg (124 lb 1.9 oz) 58.6 kg (129 lb 3 oz)    History of present illness:  [name] is a 81 y.o. female   has a past medical history of Essential hypertension; Osteoporosis; Persistent atrial fibrillation; Vertigo; Chronic renal insufficiency; Hypothyroidism; Arthritis; Anxiety; On home oxygen therapy;  Tachy-brady syndrome; GERD (gastroesophageal reflux disease); Atrial flutter; Encounter for long-term (current) use of other medications; Chronic diastolic heart failure; Coronary atherosclerosis of unspecified type of vessel, native or graft; Anemia, unspecified; Depression; Retroperitoneal bleed (2005); and Respiratory failure (07/2011).   Presented with  Patient reported tripping on the steps to her kitchen and falling. Patient was in a lot of pain on the left. She denied hitting her head. Ems was called, patient presented to [facility] and was found to have Left intertochanteric hip fracture. Patient denied any chest pain. She was able to walk well with a walker. She was fairly active. She did have hx of diastolic CHF she on 2-3L of oxygen at baseline. Recently her lasix have been decreased to 80 mg a day from 120 due to worsening renal function. Patient had some chronic lower extremity swelling.  Patient has hx of tachy-brady syndrome sp pacemaker. She is chronic A.fib but not a candidate for anticoagulation due to hx of massive retroperitoneal bleed.  Emergency department troponin was noted to be elevated at 0.11 patient denied any chest pain. Discussed with cardiology who saw her for preoperative clearance.   Hospitalist was called for admission for Left hip fracture   Hospital Course:  #1 acute on chronic respiratory failure Likely multifactorial secondary to acute on chronic diastolic heart failure in the setting of atrial fibrillation, chronic COPD on home O2, pulmonary hypertension. Patient was initially placed in the step down unit as she was requiring high amounts of oxygen. Patient was initially on the nonrebreather. Patient was seen  in consultation by cardiology and was diuresed. Patient was still difficult to titrate down her oxygenation requirements and a such a critical care consultation was obtained. Patient was seen by Dr. Kendrick Fries of pulmonary and critical care medicine will increase  patient's IV diuretics and place patient on daily Zaroxolyn. Patient started to diureses well and was -9.1 L throughout the hospitalization. Patient oxygenation improved her weight decreased and patient's O2 was titrated down back to her home regimen of 3 L nasal cannula with sats between 88-92%. Patient improved clinically such that by day of discharge she was back at her baseline. Patient's dry weight is approximately 60 kg. Patient be discharged in stable and improved condition on a home dose of Lasix 80 mg daily. Patient gains more than 3 pounds may consider giving patient a dose of Zaroxolyn. Patient is to follow-up with her cardiologist as outpatient.  #2 acute on chronic diastolic heart failure Patient with elevated troponins felt to be secondary to a demand ischemia. 2-D echo with EF of 55%. Patient was initially placed on IV Lasix and cardiology was consulted for diuresis. Patient was followed by cardiology. It was noted that patient's pulmonary status needed to be optimized prior to surgery. Patient initially remained on a nonrebreather with difficulty titrating down her oxygen. Pulmonary critical care were consulted and patient's Lasix dose was increased and patient was placed on daily Zaroxolyn. Patient started to have good diuresis her oxygenation status improved and her O2 was titrated down back to home dose of 2-3 L nasal cannula. Patient diuresed well and was -9.1 L throughout the hospitalization. Patient was subsequently transitioned back to her home regimen of Lasix 80 mg daily. Zaroxolyn was discontinued. And patient was at her baseline. Patient's dry weight is approximately 60 kg. If patient is to gain greater than 3 pounds may consider giving patient a dose of Zaroxolyn until she is back to her baseline. Patient will follow-up with her cardiologist as outpatient.   #3 left intertrochanteric hip fracture Patient was seen in consultation by orthopedics. Patient's surgery was initially held  to optimize her pulmonary function status. Once patient pulmonary function status was optimized patient subsequently underwent cephalomegaly nail placement, left peritrochanteric femur fracture on 07/17/2014 per Dr. Linna Caprice. Patient tolerated procedure well with no complications. Patient was seen by PT/OT. Patient's pulmonary status remained stable postoperatively. Patient be discharged to a skilled nursing facility with weightbearing as tolerated. Patient will follow-up with orthopedics as outpatient.   #4 hypothyroidism Continued on home dose Synthroid.  #5 atrial fibrillation Patient was maintained on a home regimen of atenolol for rate control. Patient's heart rate remained controlled throughout the hospitalization. Patient deemed NOT a long-term anticoagulation candidate secondary to prior bleeding on Coumadin.   #6 hypokalemia Likely secondary to diuresis. Repleted. Patient will be discharged on 40 mEq of potassium daily. Patient will need outpatient follow-up.  #7 elevated troponin Felt to be secondary to demand ischemia. Patient was seen in consult per cardiology and was felt no further workup was needed.   #8 COPD Stable. Nebs as needed.  #9 hypertension Stable. Patient was maintained on home regimen of atenolol and Lasix. Her Norvasc was held and was not resumed on discharge.  #10 chronic kidney disease stage IV Baseline creatinine is 2.1-2.3.  patient's renal function remained stable throughout the hospitalization and actually improved a little bit. On day of discharge patient's creatinine was 1.71.  #11 Anemia Likely postop anemia. No overt bleeding. H/H remained stable.   #12 severe protein calorie  malnutrition Patient was maintained on nutritional supplements. Patient was also placed on the heart healthy diet.   Procedures:  Chest x-ray 07/11/2014, 07/13/2014, 07/19/14  2-D echo 07/12/2014  X-ray of the left femur 07/12/2014  X-ray of the left hip with pelvis  07/11/2014  Cephalomedullary nail placement, left pertrochanteric femur 07/17/14  Consultations:  Orthopedics: Dr. supple 07/12/2014  Cardiology: Dr. Ace Gins 07/12/2014  PCCM Dr Kendrick Fries 07/15/14  Discharge Exam: Filed Vitals:   07/22/14 0611  BP: 110/65  Pulse: 76  Temp: 97.5 F (36.4 C)  Resp: 20    General: NAD Cardiovascular: RRR Respiratory: CTAB  Discharge Instructions   Discharge Instructions    Diet - low sodium heart healthy    Complete by:  As directed      Discharge instructions    Complete by:  As directed   Follow up with MD at SNF. Follow up with Dr Linna Caprice in 2 weeks.;     Increase activity slowly    Complete by:  As directed      Weight bearing as tolerated    Complete by:  As directed   Laterality:  left  Extremity:  Lower          Current Discharge Medication List    START taking these medications   Details  acetaminophen (TYLENOL) 500 MG tablet Take 2 tablets (1,000 mg total) by mouth every 8 (eight) hours. Qty: 30 tablet, Refills: 0    acetaminophen (TYLENOL) 650 MG suppository Place 1 suppository (650 mg total) rectally every 8 (eight) hours. Qty: 12 suppository, Refills: 0    methocarbamol (ROBAXIN) 500 MG tablet Take 1 tablet (500 mg total) by mouth every 6 (six) hours as needed for muscle spasms. Qty: 30 tablet, Refills: 0    senna (SENOKOT) 8.6 MG TABS tablet Take 1 tablet (8.6 mg total) by mouth daily. Qty: 120 each, Refills: 0      CONTINUE these medications which have CHANGED   Details  aspirin EC 325 MG EC tablet Take 1 tablet (325 mg total) by mouth daily with breakfast. Qty: 30 tablet, Refills: 0    furosemide (LASIX) 80 MG tablet 80mg  once a day Qty: 30 tablet, Refills: 0    metolazone (ZAROXOLYN) 2.5 MG tablet Take 1 tablet (2.5 mg total) by mouth once a week. Take 30 minutes before Lasix on Mondays if patient's weight is greater than 3lbs from baseline, dry weight of 60kg. May also need to discuss with patient's  daughter, [name]. Qty: 12 tablet, Refills: 0    potassium chloride SA (K-DUR,KLOR-CON) 20 MEQ tablet Take 2 tablets (40 mEq total) by mouth daily. Qty: 60 tablet, Refills: 0    traMADol (ULTRAM) 50 MG tablet Take 1 tablet (50 mg total) by mouth every 6 (six) hours as needed for moderate pain. Qty: 60 tablet, Refills: 0      CONTINUE these medications which have NOT CHANGED   Details  atenolol (TENORMIN) 25 MG tablet Take 1 tablet by mouth two  times daily Qty: 180 tablet, Refills: 1    buPROPion (WELLBUTRIN XL) 300 MG 24 hr tablet Take 300 mg by mouth daily.    Cholecalciferol (VITAMIN D3) 2000 UNITS TABS Take 2,000 Units by mouth daily.    estradiol (ESTRACE) 0.1 MG/GM vaginal cream Place 2 g vaginally at bedtime.     famotidine (PEPCID) 20 MG tablet Take 20 mg by mouth 2 (two) times daily.    levothyroxine (SYNTHROID, LEVOTHROID) 88 MCG tablet Take 1 tablet by mouth  daily before breakfast Qty: 90 tablet, Refills: 1    Multiple Vitamins-Minerals (ICAPS PO) Take 1 capsule by mouth daily.    NON FORMULARY Take 1 tablet by mouth daily. Ultra Flora IB once a day    omeprazole (PRILOSEC) 20 MG capsule Take 40 mg by mouth daily.     trimethoprim (TRIMPEX) 100 MG tablet Take 100 mg by mouth daily.    hyoscyamine (LEVSIN SL) 0.125 MG SL tablet PLACE 1 TABLET (0.125 MG TOTAL) UNDER THE TONGUE EVERY 4 (FOUR) HOURS AS NEEDED FOR CRAMPING. Qty: 15 tablet, Refills: 0    loperamide (IMODIUM A-D) 2 MG tablet Take 2-4 mg by mouth as needed for diarrhea or loose stools. Take 2 tablets at first loose stool, then take 1 tablet as needed for more diarrhea    mupirocin ointment (BACTROBAN) 2 %     ondansetron (ZOFRAN) 4 MG tablet Take 4 mg by mouth every 6 (six) hours as needed for nausea or vomiting.      STOP taking these medications     acetaminophen (TYLENOL ARTHRITIS PAIN) 650 MG CR tablet      amLODipine (NORVASC) 5 MG tablet      potassium chloride (K-DUR) 10 MEQ tablet         Allergies  Allergen Reactions  . Quinidine Other (See Comments)    Skin peels  . Tiazac [Diltiazem Hcl] Hives    "don't remember how bad the reaction was" (04/24/2012)  . Warfarin Sodium Other (See Comments)    Life-threatening retroperitoneal bleed in 2005  . Percocet [Oxycodone-Acetaminophen] Other (See Comments)    confusion  . Promethazine Hcl Other (See Comments)    confusion  . Amiodarone Other (See Comments)    Lung problem per daughter  . Procainamide Other (See Comments)    Lupus reaction  . Warfarin And Related Other (See Comments)    Abdominal bleed  . Amoxicillin Rash  . Ciprofloxacin Rash  . Polocaine [Mepivacaine Hcl] Palpitations and Rash  . Septra [Sulfamethoxazole W/Trimethoprim (Co-Trimoxazole)] Rash and Other (See Comments)    Dizziness and tremor "don't remember how bad the reaction was" (04/24/2012)   Follow-up Information    Follow up with Swinteck, Cloyde Reams, MD. Schedule an appointment as soon as possible for a visit in 2 weeks.   Specialty:  Orthopedic Surgery   Why:  For wound re-check   Contact information:   [address] [place] [address] [phone]       Please follow up.   Why:  f/u with MD at SNF      Follow up with Lesleigh Noe, MD. Schedule an appointment as soon as possible for a visit in 2 weeks.   Specialty:  Cardiology   Contact information:   [address] [place] [address] [phone]        The results of significant diagnostics from this hospitalization (including imaging, microbiology, ancillary and laboratory) are listed below for reference.    Significant Diagnostic Studies: Dg Chest 1 View  07/11/2014   CLINICAL DATA:  Status post fall. Respiratory failure. Initial encounter.  EXAM: CHEST  1 VIEW  COMPARISON:  Chest radiograph performed 07/25/2013  FINDINGS: The lungs are well-aerated. Pulmonary vascularity is at the upper limits of normal. Densities at the lung  bases reflect overlying costochondral calcification. There is no evidence of focal opacification, pleural effusion or pneumothorax.  The cardiomediastinal silhouette is borderline enlarged. A pacemaker is noted overlying the left chest wall, with  leads ending overlying the right atrium and right ventricle. No acute osseous abnormalities are seen. Left proximal humeral hardware is grossly unremarkable, though incompletely imaged.  IMPRESSION: No acute cardiopulmonary process seen; borderline cardiomegaly. No displaced rib fractures identified.   Electronically Signed   By: Roanna Raider M.D.   On: 07/11/2014 22:55   Pelvis Portable  07/18/2014   CLINICAL DATA:  Post ORIF.  EXAM: PORTABLE PELVIS 1-2 VIEWS  COMPARISON:  Left femur radiographs 07/12/2014  FINDINGS: Intra medullary rod with locking screw traversing left intertrochanteric femur fracture. Alignment is improved. Recent postsurgical change includes air in the soft tissues. Right hip arthroplasty in place. Examination is limited by positioning.  IMPRESSION: Intra medullary rod with proximal locking screw traversing left intertrochanteric femur fracture. No immediate postoperative complication.   Electronically Signed   By: Rubye Oaks M.D.   On: 07/18/2014 06:02   Dg Chest Port 1 View  07/19/2014   CLINICAL DATA:  Respiratory failure. History of atrial fibrillation, hypertension and Coronary artery disease.  EXAM: PORTABLE CHEST - 1 VIEW  COMPARISON:  07/16/2014  FINDINGS: Left chest wall pacer device is noted with lead in the right atrial appendage and right ventricle. Previous hardware fixation of the proximal left humerus. The heart size is moderately enlarged. Diffuse coarsened interstitial markings are identified bilaterally. Superimposed interstitial and airspace opacities in both lungs appear unchanged from previous exam.  IMPRESSION: 1. Persistent bilateral interstitial and airspace opacities, likely due to edema.   Electronically Signed    By: Signa Kell M.D.   On: 07/19/2014 08:36   Dg Chest Port 1 View  07/16/2014   CLINICAL DATA:  Shortness of breath, hypertension.  EXAM: PORTABLE CHEST - 1 VIEW  COMPARISON:  07/15/2014  FINDINGS: Left pacer is in place with leads in the right atrium and right ventricle, unchanged. Cardiomegaly. Persistent interstitial and alveolar opacities throughout the lungs, not significantly changed, likely edema. Pneumonia cannot be completely excluded. No visible effusions. No acute bony abnormality.  IMPRESSION: Persistent bilateral interstitial and alveolar opacities, edema versus infection. No real change.   Electronically Signed   By: Charlett Nose M.D.   On: 07/16/2014 08:34   Dg Chest Port 1 View  07/15/2014   CLINICAL DATA:  Shortness of breath.  EXAM: PORTABLE CHEST - 1 VIEW  COMPARISON:  07/13/2014.  FINDINGS: Mediastinum and hilar structures are normal. Cardiac pacer stable position. Stable cardiomegaly. Stable diffuse from interstitial prominence. Congestive heart failure could present in this fashion. Pneumonitis cannot be excluded. No pleural effusion or pneumothorax.  IMPRESSION: Persistent cardiomegaly with bilateral pulmonary interstitial prominence suggesting congestive heart failure. Pneumonitis cannot be excluded.   Electronically Signed   By: Maisie Fus  Register   On: 07/15/2014 07:19   Dg Chest Port 1 View  07/13/2014   CLINICAL DATA:  Shortness of breath. Congestive heart failure. Weakness. Left hip fracture. Pre-op respiratory exam  EXAM: PORTABLE CHEST - 1 VIEW  COMPARISON:  07/11/2014  FINDINGS: Cardiomegaly stable. Increased diffuse interstitial infiltrates are seen, consistent with mild interstitial edema. No evidence of focal pulmonary consolidation or pleural effusion. Transvenous pacemaker remains in appropriate position.  IMPRESSION: Increased diffuse interstitial infiltrates, suspicious for interstitial edema. Stable cardiomegaly.   Electronically Signed   By: Myles Rosenthal M.D.   On:  07/13/2014 10:32   Dg C-arm 1-60 Min-no Report  07/17/2014   CLINICAL DATA:  Intertrochanteric left femur fracture fixation. Initial encounter.  EXAM: DG C-ARM 1-60 MIN - NRPT [facility]; LEFT FEMUR 2 VIEWS  COMPARISON:  Radiographs 07/12/2014.  FLUOROSCOPY TIME: C-arm fluoroscopic images were obtained intraoperatively and submitted for post operative interpretation. Please see the performing provider's procedural report for the fluoroscopy time utilized.  FINDINGS: Four spot fluoroscopic images of the left femur demonstrate intramedullary rod and dynamic screw fixation of the comminuted intertrochanteric femur fracture. There is a distal interlocking screw. The main fracture fragments demonstrate near anatomic alignment. No demonstrated complication.  IMPRESSION: ORIF of intertrochanteric left femur fracture. No demonstrated complication.   Electronically Signed   By: Carey Bullocks M.D.   On: 07/17/2014 22:27   Dg Hip Unilat With Pelvis 2-3 Views Left  07/11/2014   CLINICAL DATA:  Fall this evening with left hip pain.  EXAM: LEFT HIP (WITH PELVIS) 2-3 VIEWS  COMPARISON:  None.  FINDINGS: Right hip arthroplasty. Lower lumbar spondylosis. Vascular calcifications.  Comminuted intratrochanteric left femur fracture.  No dislocation  IMPRESSION: Comminuted intratrochanteric left femur fracture.   Electronically Signed   By: Jeronimo Greaves M.D.   On: 07/11/2014 22:45   Dg Femur Min 2 Views Left  07/18/2014   CLINICAL DATA:  Post ORIF.  EXAM: LEFT FEMUR 2 VIEWS  COMPARISON:  07/12/2014  FINDINGS: Intra medullary rod with proximal and distal locking screws traverse left hip intertrochanteric femur fracture. There is improved alignment. Displaced fragment involving the lesser trochanter is again seen. Recent postsurgical change includes air in the adjacent soft tissues.  IMPRESSION: Post ORIF left intertrochanteric femur fracture without immediate postoperative complication.   Electronically Signed   By: Rubye Oaks  M.D.   On: 07/18/2014 06:04   Dg Femur Min 2 Views Left  07/17/2014   CLINICAL DATA:  Intertrochanteric left femur fracture fixation. Initial encounter.  EXAM: DG C-ARM 1-60 MIN - [facility]PT [facility]; LEFT FEMUR 2 VIEWS  COMPARISON:  Radiographs 07/12/2014.  FLUOROSCOPY TIME: C-arm fluoroscopic images were obtained intraoperatively and submitted for post operative interpretation. Please see the performing provider's procedural report for the fluoroscopy time utilized.  FINDINGS: Four spot fluoroscopic images of the left femur demonstrate intramedullary rod and dynamic screw fixation of the comminuted intertrochanteric femur fracture. There is a distal interlocking screw. The main fracture fragments demonstrate near anatomic alignment. No demonstrated complication.  IMPRESSION: ORIF of intertrochanteric left femur fracture. No demonstrated complication.   Electronically Signed   By: Carey Bullocks M.D.   On: 07/17/2014 22:27   Dg Femur Min 2 Views Left  07/12/2014   CLINICAL DATA:  Fall yesterday with left hip injury and pain. Subsequent encounter.  EXAM: LEFT FEMUR 2 VIEWS  COMPARISON:  07/11/2014  FINDINGS: A comminuted intertrochanteric left femur fracture is noted with varus angulation.  There is no evidence of subluxation or dislocation.  No other fractures are identified.  The mid and distal femur are unremarkable.  No focal bony lesions are identified.  IMPRESSION: Comminuted intertrochanteric left femur fracture with varus angulation.   Electronically Signed   By: Harmon Pier M.D.   On: 07/12/2014 17:02    Microbiology: Recent Results (from the past 240 hour(s))  Surgical pcr screen     Status: None   Collection Time: 07/12/14 12:54 PM  Result Value Ref Range Status   MRSA, PCR NEGATIVE NEGATIVE Final   Staphylococcus aureus NEGATIVE NEGATIVE Final    Comment:        The Xpert SA Assay (FDA approved for NASAL specimens in patients over 70 years of age), is one component of a comprehensive  surveillance program.  Test performance has been validated by [facility] for patients greater than or equal to 79 year old. It is not intended to diagnose infection nor to guide or monitor treatment.      Labs: Basic Metabolic Panel:  Recent Labs Lab 07/18/14 1450 07/19/14 0405 07/19/14 0918 07/20/14 0412 07/21/14 0415 07/22/14 0504  NA 137 136  --  142 144 144  K 3.2* 2.9*  --  3.0* 3.4* 3.2*  CL 94* 96  --  97 97 97  CO2 29 33*  --  31 35* 33*  GLUCOSE 130* 101*  --  91 97 85  BUN 81* 87*  --  81* 79* 75*  CREATININE 2.05* 1.85*  --  1.59* 1.73* 1.71*  CALCIUM 8.2* 8.5  --  8.9 9.4 9.4  MG  --   --  2.4  --   --   --    Liver Function Tests: No results for input(s): AST, ALT, ALKPHOS, BILITOT, PROT, ALBUMIN in the last 168 hours. No results for input(s): LIPASE, AMYLASE in the last 168 hours. No results for input(s): AMMONIA in the last 168 hours. CBC:  Recent Labs Lab 07/16/14 0400 07/17/14 0415 07/18/14 0430 07/19/14 0405 07/20/14 0412 07/21/14 0415  WBC 12.8* 14.2* 17.5* 11.7* 11.5* 13.0*  NEUTROABS 9.7*  --   --   --   --   --   HGB 11.5* 10.5* 11.3* 9.3* 9.7* 9.9*  HCT 36.9 34.2* 36.5 29.2* 31.1* 33.0*  MCV 92.0 92.2 91.3 89.6 89.9 91.4  PLT PLATELET CLUMPS NOTED ON SMEAR, COUNT APPEARS ADEQUATE 241 289 287 347 394   Cardiac Enzymes: No results for input(s): CKTOTAL, CKMB, CKMBINDEX, TROPONINI in the last 168 hours. BNP: BNP (last 3 results)  Recent Labs  07/11/14 2328 07/15/14 0413  BNP 299.6* 359.3*    ProBNP (last 3 results)  Recent Labs  07/25/13 1215  PROBNP 4206.0*    CBG: No results for input(s): GLUCAP in the last 168 hours.     SignedRamiro Harvest:  Sheikh Leverich MD [facility] 07/22/2014, 12:03 PM

## 2014-07-22 NOTE — Progress Notes (Signed)
Physical Therapy Treatment Patient Details Name: [name] MRN: [id number] DOB: December 12, 1919 Today's Date: 07/22/2014    History of Present Illness 81 yo female s/p L IM nail femur 07/17/14. Hx of vertigo, HTN, Afib, osteoporosis, Aflutter, CHF, tachybrady syndrome, pacemaker, O2 dependent.     PT Comments    Progressing slowly with mobility. Pt continues to require +2 assist. Remains limited by pain, decreased activity tolerance, and general weakness. Continue to recommend SNF.   Follow Up Recommendations  SNF;Supervision/Assistance - 24 hour     Equipment Recommendations  None recommended by PT    Recommendations for Other Services       Precautions / Restrictions Precautions Precautions: Fall Restrictions Weight Bearing Restrictions: Yes LLE Weight Bearing: Weight bearing as tolerated    Mobility  Bed Mobility               General bed mobility comments: pt OOB in recliner  Transfers Overall transfer level: Needs assistance Equipment used: Rolling walker (2 wheeled) Transfers: Sit to/from [employer];Stand Pivot Transfers Sit to Stand: Mod assist;+2 physical assistance;+2 safety/equipment Stand pivot transfers: Mod assist;+2 physical assistance;+2 safety/equipment       General transfer comment: Assist to rise, stabilize, control descent. Multimodal cues for safety, technique, proper hand placement. Pt tends to sit abruptly-ignoring safety cues from therapist. [facility].   Ambulation/Gait Ambulation/Gait assistance: Mod assist;+2 physical assistance;+2 safety/equipment Ambulation Distance (Feet): 5 Feet   Gait Pattern/deviations: Trunk flexed;Antalgic;Decreased stance time - left;Step-to pattern     General Gait Details: Assist to support pt and maneuver safely with RW. Followed closely with recliner. Pt fatigues easily. 2 standing rest breaks needed within that very short distances.    Stairs            Wheelchair  Mobility    Modified Rankin (Stroke Patients Only)       Balance                                    Cognition Arousal/Alertness: Awake/alert Behavior During Therapy: WFL for tasks assessed/performed Overall Cognitive Status: Within Functional Limits for tasks assessed                      Exercises      General Comments        Pertinent Vitals/Pain Pain Assessment: Faces Faces Pain Scale: Hurts whole lot Pain Location: L LE Pain Descriptors / Indicators: Sharp;Sore Pain Intervention(s): Repositioned;Limited activity within patient's tolerance    Home Living                      Prior Function            PT Goals (current goals can now be found in the care plan section) Progress towards PT goals: Progressing toward goals (very slowly)    Frequency  Min 3X/week    PT Plan Current plan remains appropriate    Co-evaluation             End of Session Equipment Utilized During Treatment: Oxygen;Gait belt Activity Tolerance: Patient limited by fatigue;Patient limited by pain Patient left: in chair;with call bell/phone within reach;with family/visitor present     Time: [id number] PT Time Calculation (min) (ACUTE ONLY): 22 min  Charges:  $Therapeutic Activity: 8-22 mins  G Codes:      Weston Anna, MPT Pager: [phone]

## 2014-07-23 ENCOUNTER — Non-Acute Institutional Stay (SKILLED_NURSING_FACILITY): Payer: MEDICARE | Admitting: Internal Medicine

## 2014-07-23 DIAGNOSIS — D500 Iron deficiency anemia secondary to blood loss (chronic): Secondary | ICD-10-CM

## 2014-07-23 DIAGNOSIS — F329 Major depressive disorder, single episode, unspecified: Secondary | ICD-10-CM | POA: Diagnosis not present

## 2014-07-23 DIAGNOSIS — I4891 Unspecified atrial fibrillation: Secondary | ICD-10-CM

## 2014-07-23 DIAGNOSIS — E039 Hypothyroidism, unspecified: Secondary | ICD-10-CM

## 2014-07-23 DIAGNOSIS — N390 Urinary tract infection, site not specified: Secondary | ICD-10-CM | POA: Diagnosis not present

## 2014-07-23 DIAGNOSIS — E876 Hypokalemia: Secondary | ICD-10-CM

## 2014-07-23 DIAGNOSIS — I5032 Chronic diastolic (congestive) heart failure: Secondary | ICD-10-CM

## 2014-07-23 DIAGNOSIS — K219 Gastro-esophageal reflux disease without esophagitis: Secondary | ICD-10-CM

## 2014-07-23 DIAGNOSIS — K5909 Other constipation: Secondary | ICD-10-CM

## 2014-07-23 DIAGNOSIS — F32A Depression, unspecified: Secondary | ICD-10-CM

## 2014-07-23 DIAGNOSIS — I10 Essential (primary) hypertension: Secondary | ICD-10-CM | POA: Diagnosis not present

## 2014-07-23 DIAGNOSIS — S7292XS Unspecified fracture of left femur, sequela: Secondary | ICD-10-CM

## 2014-07-24 NOTE — Progress Notes (Signed)
Patient ID: [name], female   DOB: 11-Mar-1920, 81 y.o.   MRN: [id number]     [facility]   PCP: Carollee Herter, MD  Code Status: DNR  Allergies  Allergen Reactions  . Quinidine Other (See Comments)    Skin peels  . Tiazac [Diltiazem Hcl] Hives    "don't remember how bad the reaction was" (04/24/2012)  . Warfarin Sodium Other (See Comments)    Life-threatening retroperitoneal bleed in 2005  . Percocet [Oxycodone-Acetaminophen] Other (See Comments)    confusion  . Promethazine Hcl Other (See Comments)    confusion  . Amiodarone Other (See Comments)    Lung problem per daughter  . Procainamide Other (See Comments)    Lupus reaction  . Warfarin And Related Other (See Comments)    Abdominal bleed  . Amoxicillin Rash  . Ciprofloxacin Rash  . Polocaine [Mepivacaine Hcl] Palpitations and Rash  . Septra [Sulfamethoxazole W/Trimethoprim (Co-Trimoxazole)] Rash and Other (See Comments)    Dizziness and tremor "don't remember how bad the reaction was" (04/24/2012)    Chief Complaint  Patient presents with  . New Admit To SNF     HPI:  81 year old patient is here for short term rehabilitation post hospital admission from 07/11/14-07/22/14 post mechanical fall and sustained a left hip fracture. She also had acute on chronic respiratory failure in setting of her copd. Pulmonary HTN, CHF and afib. She required NRB mask first, iv diuretics and then was switched to nasal canula. She underwent surgical repair of her femur fracture.  She has past medical history of hypertension, afib, Chronic diastolic heart failure, Coronary atherosclerosis of unspecified type of vessel, native or graft, Anemia, Depression, Retroperitoneal bleed (2005), vertigo, Osteoporosis, Chronic renal insufficiency, Hypothyroidism, Tachy-brady syndrome s/p pacemaker, GERD, anxiety, and arthritis among others.    She is seen in her room today. Her private nurse is present in  the room. She is on oxygen, appears frail but is in no distress. Her pain is not under control. Denies any muscle spasm. She had a bowel movement yesterday. Her appetite has been poor and she gets tired easily. Her breathing has improved. Denies any other concerns.   Review of Systems:  Constitutional: Negative for fever, chills, diaphoresis.  HENT: Negative for headache, congestion Eyes: Negative for eye pain, blurred vision, double vision and discharge.  Respiratory: Negative for cough, shortness of breath and wheezing.   Cardiovascular: Negative for chest pain, palpitations, leg swelling.  Gastrointestinal: Negative for heartburn, nausea, vomiting, abdominal pain Genitourinary: Negative for dysuria, flank pain.  Musculoskeletal: Negative for back pain, falls Skin: Negative for itching, rash.  Neurological: Negative for dizziness, tingling, focal weakness    Past Medical History  Diagnosis Date  . Essential hypertension     controlled  . Osteoporosis   . Persistent atrial fibrillation     probably permanent  . Vertigo   . Chronic renal insufficiency   . Hypothyroidism   . Arthritis   . Anxiety   . On home oxygen therapy     uses O2 @ 2l/m nasally bedtime  . Tachy-brady syndrome      treated with amiodarone and permanent DDD pacemaker, 1993  . GERD (gastroesophageal reflux disease)   . Atrial flutter   . Encounter for long-term (current) use of other medications     prior bleeding with coumadin, presently on asa  . Chronic diastolic heart failure   . Coronary atherosclerosis of unspecified type of vessel, native or  graft     asymptomatic  . Anemia, unspecified   . Depression   . Retroperitoneal bleed 2005    LIFE THREATENING spontaneous retroperitoneal bleed on coumadin therapy 2005  . Respiratory failure 07/2011   Past Surgical History  Procedure Laterality Date  . Knee arthroscopy Right   . Total hip arthroplasty Right   . Esophagogastroduodenoscopy N/A 11/26/2012      Procedure: ESOPHAGOGASTRODUODENOSCOPY (EGD);  Surgeon: Charna ElizabethJyothi Mann, MD;  Location: [facility];  Service: Endoscopy;  Laterality: N/A;  . Joint replacement      rt hip  . Shoulder surgery    . Esophagogastroduodenoscopy N/A 03/08/2013    Procedure: ESOPHAGOGASTRODUODENOSCOPY (EGD);  Surgeon: Theda BelfastPatrick D Hung, MD;  [facility];  Service: Endoscopy;  Laterality: N/A;  . Pacemaker insertion    . Cholecystectomy  10/08/1997    PPM implanted by Dr Amil AmenEdmunds for tachy/brady syndrome  . Pacemaker generator change  6/10/20087    generator change Zephyr XL DR by Dr Amil AmenEdmunds  . Femur im nail Left 07/17/2014    Procedure: INTRAMEDULLARY (IM) NAIL FEMORAL;  Surgeon: Samson FredericBrian Swinteck, MD;  Location: [facility];  Service: Orthopedics;  Laterality: Left;   Social History:   reports that she has never smoked. She has never used smokeless tobacco. She reports that she does not drink alcohol or use illicit drugs.  Family History  Problem Relation Age of Onset  . Hypertension Mother   . Heart disease Mother     Heart failure (vague history)    Medications: Patient's Medications  New Prescriptions   No medications on file  Previous Medications   ACETAMINOPHEN (TYLENOL) 500 MG TABLET    Take 2 tablets (1,000 mg total) by mouth every 8 (eight) hours.   ACETAMINOPHEN (TYLENOL) 650 MG SUPPOSITORY    Place 1 suppository (650 mg total) rectally every 8 (eight) hours.   ASPIRIN EC 325 MG EC TABLET    Take 1 tablet (325 mg total) by mouth daily with breakfast.   ATENOLOL (TENORMIN) 25 MG TABLET    Take 1 tablet by mouth two  times daily   BUPROPION (WELLBUTRIN XL) 300 MG 24 HR TABLET    Take 300 mg by mouth daily.   CHOLECALCIFEROL (VITAMIN D3) 2000 UNITS TABS    Take 2,000 Units by mouth daily.   ESTRADIOL (ESTRACE) 0.1 MG/GM VAGINAL CREAM    Place 2 g vaginally at bedtime.    FAMOTIDINE (PEPCID) 20 MG TABLET    Take 20 mg by mouth 2 (two) times daily.   FUROSEMIDE (LASIX) 80 MG TABLET    80mg  once a day    HYOSCYAMINE (LEVSIN SL) 0.125 MG SL TABLET    PLACE 1 TABLET (0.125 MG TOTAL) UNDER THE TONGUE EVERY 4 (FOUR) HOURS AS NEEDED FOR CRAMPING.   LEVOTHYROXINE (SYNTHROID, LEVOTHROID) 88 MCG TABLET    Take 1 tablet by mouth  daily before breakfast   LOPERAMIDE (IMODIUM A-D) 2 MG TABLET    Take 2-4 mg by mouth as needed for diarrhea or loose stools. Take 2 tablets at first loose stool, then take 1 tablet as needed for more diarrhea   METHOCARBAMOL (ROBAXIN) 500 MG TABLET    Take 1 tablet (500 mg total) by mouth every 6 (six) hours as needed for muscle spasms.   METOLAZONE (ZAROXOLYN) 2.5 MG TABLET    Take 1 tablet (2.5 mg total) by mouth once a week. Take 30 minutes before Lasix on Mondays if patient's weight is greater than 3lbs from baseline,  dry weight of 60kg. May also need to discuss with patient's daughter, [name].   MULTIPLE VITAMINS-MINERALS (ICAPS PO)    Take 1 capsule by mouth daily.   MUPIROCIN OINTMENT (BACTROBAN) 2 %       NON FORMULARY    Take 1 tablet by mouth daily. Ultra Flora IB once a day   OMEPRAZOLE (PRILOSEC) 20 MG CAPSULE    Take 40 mg by mouth daily.    ONDANSETRON (ZOFRAN) 4 MG TABLET    Take 4 mg by mouth every 6 (six) hours as needed for nausea or vomiting.   POTASSIUM CHLORIDE SA (K-DUR,KLOR-CON) 20 MEQ TABLET    Take 2 tablets (40 mEq total) by mouth daily.   SENNA (SENOKOT) 8.6 MG TABS TABLET    Take 1 tablet (8.6 mg total) by mouth daily.   TRAMADOL (ULTRAM) 50 MG TABLET    Take 1 tablet (50 mg total) by mouth every 6 (six) hours as needed for moderate pain.   TRIMETHOPRIM (TRIMPEX) 100 MG TABLET    Take 100 mg by mouth daily.  Modified Medications   No medications on file  Discontinued Medications   No medications on file     Physical Exam: Filed Vitals:   07/23/14 1154  BP: 125/58  Pulse: 76  Temp: 97.5 F (36.4 C)  Resp: 16  SpO2: 93%    General- elderly female, frail, in no acute distress Head- normocephalic, atraumatic Throat- moist mucus  membrane Neck- no cervical lymphadenopathy, no jugular vein distension Cardiovascular- normal s1,s2, no murmurs, palpable dorsalis pedis and radial pulses, trace left leg edema Respiratory- bilateral clear to auscultation, no wheeze, no rhonchi, no crackles, no use of accessory muscles, on o2 Abdomen- bowel sounds present, soft, non tender Musculoskeletal- able to move all 4 extremities, limited range of motion to left leg at hip  Neurological- no focal deficit Skin- warm and dry, surgical incision in left hip with aquacel dressing Psychiatry- alert and oriented    Labs reviewed: Basic Metabolic Panel:  Recent Labs  16/10/96 2146  07/14/14 0355  07/19/14 0918 07/20/14 0412 07/21/14 0415 07/22/14 0504  NA 134*  < > 138  < >  --  142 144 144  K 3.3*  < > 3.2*  < >  --  3.0* 3.4* 3.2*  CL 97  < > 100  < >  --  97 97 97  CO2 21  < > 31  < >  --  31 35* 33*  GLUCOSE 72  < > 114*  < >  --  91 97 85  BUN 35*  < > 31*  < >  --  81* 79* 75*  CREATININE 1.70*  < > 1.77*  < >  --  1.59* 1.73* 1.71*  CALCIUM 8.3*  < > 8.4  < >  --  8.9 9.4 9.4  MG 2.1  --  2.0  --  2.4  --   --   --   PHOS 2.7  --   --   --   --   --   --   --   < > = values in this interval not displayed. Liver Function Tests:  Recent Labs  07/28/13 0713 08/06/13 1508 07/12/14 0320  AST 56* 21 22  ALT 73* 21 13  ALKPHOS 77 75 83  BILITOT 0.5 0.3 0.8  PROT 5.8* 6.1 6.1  ALBUMIN 2.8* 3.7 3.6    Recent Labs  07/25/13 1215  LIPASE 49  No results for input(s): AMMONIA in the last 8760 hours. CBC:  Recent Labs  07/11/14 2333 07/12/14 0320  07/16/14 0400  07/19/14 0405 07/20/14 0412 07/21/14 0415  WBC 13.6* 12.9*  < > 12.8*  < > 11.7* 11.5* 13.0*  NEUTROABS 11.1* 9.5*  --  9.7*  --   --   --   --   HGB 11.9* 11.3*  < > 11.5*  < > 9.3* 9.7* 9.9*  HCT 37.2 36.1  < > 36.9  < > 29.2* 31.1* 33.0*  MCV 91.9 91.6  < > 92.0  < > 89.6 89.9 91.4  PLT 241 237  < > PLATELET CLUMPS NOTED ON SMEAR, COUNT APPEARS  ADEQUATE  < > 287 347 394  < > = values in this interval not displayed. Cardiac Enzymes:  Recent Labs  07/12/14 0320 07/12/14 0914 07/12/14 1508  TROPONINI 0.11* 0.12* 0.11*     Assessment/Plan  Left femur fracture S/p surgical repair. With heparin not under control, change tramadol to 100 mg bid and continue 50 mg every 6 h prn for breakthrough. Continue robaxin 500 q6h prn for muscle spasm. Continue aspirin EC 325 mg daily for dvt prophylaxis. Continue vit d supplement. WBAT. Will have patient work with PT/OT as tolerated to regain strength and restore function.  Fall precautions are in place. Has f/u with orthopedics  HTN bp stable. Continue atenolol 25 mg bid  Constipation Stable, continue senokot daily  afib Rate controlled. Continue atenolol 25 mg bid with aspirin. Off anticoagulation with retroperitoneal bleed  CHF Currently euvolemic, monitor daily weight. Continue lasix 80 mg daily, zaroxolyn on hold for now.  Hypokalemia Low k on discharge, pt on lasix, continue kcl 40 meq daily. recheck bmp  Depression Continue wellbutrin xl 300 mg daily  gerd Continue pepcid 20 mg bid and prilosec 40 mg daily  Hypothyroidism Continue levothyroxine 88 mcg daily  Recurrent UTI Continue bactrim for prophylaxis, currently asymptomatic  Anemia Likely blood loss post op, monitor h&h   Goals of care: short term rehabilitation    Labs/tests ordered: cbc, cmp 07/29/14  Family/ staff Communication: reviewed care plan with patient and nursing supervisor    Oneal Grout, MD  [facility] [phone] (Monday-Friday 8 am - 5 pm) [phone] (afterhours)

## 2014-07-25 ENCOUNTER — Ambulatory Visit: Payer: MEDICARE | Admitting: Podiatry

## 2014-08-08 ENCOUNTER — Emergency Department (HOSPITAL_COMMUNITY)
Admission: EM | Admit: 2014-08-08 | Discharge: 2014-08-08 | Disposition: A | Discharge status: Home / Self Care | Payer: MEDICARE | Attending: Emergency Medicine | Admitting: Emergency Medicine

## 2014-08-08 ENCOUNTER — Emergency Department (HOSPITAL_COMMUNITY): Payer: MEDICARE

## 2014-08-08 ENCOUNTER — Encounter (HOSPITAL_COMMUNITY): Payer: Self-pay | Admitting: Emergency Medicine

## 2014-08-08 DIAGNOSIS — I129 Hypertensive chronic kidney disease with stage 1 through stage 4 chronic kidney disease, or unspecified chronic kidney disease: Secondary | ICD-10-CM | POA: Diagnosis not present

## 2014-08-08 DIAGNOSIS — M810 Age-related osteoporosis without current pathological fracture: Secondary | ICD-10-CM | POA: Diagnosis not present

## 2014-08-08 DIAGNOSIS — S0990XA Unspecified injury of head, initial encounter: Secondary | ICD-10-CM

## 2014-08-08 DIAGNOSIS — Z792 Long term (current) use of antibiotics: Secondary | ICD-10-CM | POA: Diagnosis not present

## 2014-08-08 DIAGNOSIS — S51002A Unspecified open wound of left elbow, initial encounter: Secondary | ICD-10-CM | POA: Diagnosis not present

## 2014-08-08 DIAGNOSIS — Y998 Other external cause status: Secondary | ICD-10-CM | POA: Insufficient documentation

## 2014-08-08 DIAGNOSIS — Z79899 Other long term (current) drug therapy: Secondary | ICD-10-CM | POA: Insufficient documentation

## 2014-08-08 DIAGNOSIS — Z7982 Long term (current) use of aspirin: Secondary | ICD-10-CM | POA: Insufficient documentation

## 2014-08-08 DIAGNOSIS — M1990 Unspecified osteoarthritis, unspecified site: Secondary | ICD-10-CM | POA: Insufficient documentation

## 2014-08-08 DIAGNOSIS — D649 Anemia, unspecified: Secondary | ICD-10-CM | POA: Insufficient documentation

## 2014-08-08 DIAGNOSIS — I2510 Atherosclerotic heart disease of native coronary artery without angina pectoris: Secondary | ICD-10-CM | POA: Insufficient documentation

## 2014-08-08 DIAGNOSIS — S0101XA Laceration without foreign body of scalp, initial encounter: Secondary | ICD-10-CM | POA: Insufficient documentation

## 2014-08-08 DIAGNOSIS — E039 Hypothyroidism, unspecified: Secondary | ICD-10-CM | POA: Diagnosis not present

## 2014-08-08 DIAGNOSIS — N189 Chronic kidney disease, unspecified: Secondary | ICD-10-CM | POA: Insufficient documentation

## 2014-08-08 DIAGNOSIS — Y9289 Other specified places as the place of occurrence of the external cause: Secondary | ICD-10-CM | POA: Diagnosis not present

## 2014-08-08 DIAGNOSIS — Z9981 Dependence on supplemental oxygen: Secondary | ICD-10-CM | POA: Insufficient documentation

## 2014-08-08 DIAGNOSIS — S51012A Laceration without foreign body of left elbow, initial encounter: Secondary | ICD-10-CM

## 2014-08-08 DIAGNOSIS — K219 Gastro-esophageal reflux disease without esophagitis: Secondary | ICD-10-CM | POA: Insufficient documentation

## 2014-08-08 DIAGNOSIS — I5032 Chronic diastolic (congestive) heart failure: Secondary | ICD-10-CM | POA: Diagnosis not present

## 2014-08-08 DIAGNOSIS — F419 Anxiety disorder, unspecified: Secondary | ICD-10-CM | POA: Diagnosis not present

## 2014-08-08 DIAGNOSIS — Z880 Allergy status to penicillin: Secondary | ICD-10-CM | POA: Insufficient documentation

## 2014-08-08 DIAGNOSIS — Z8709 Personal history of other diseases of the respiratory system: Secondary | ICD-10-CM | POA: Insufficient documentation

## 2014-08-08 DIAGNOSIS — Y9389 Activity, other specified: Secondary | ICD-10-CM | POA: Diagnosis not present

## 2014-08-08 DIAGNOSIS — Z950 Presence of cardiac pacemaker: Secondary | ICD-10-CM | POA: Diagnosis not present

## 2014-08-08 DIAGNOSIS — F329 Major depressive disorder, single episode, unspecified: Secondary | ICD-10-CM | POA: Diagnosis not present

## 2014-08-08 DIAGNOSIS — W010XXA Fall on same level from slipping, tripping and stumbling without subsequent striking against object, initial encounter: Secondary | ICD-10-CM | POA: Insufficient documentation

## 2014-08-08 DIAGNOSIS — W19XXXA Unspecified fall, initial encounter: Secondary | ICD-10-CM

## 2014-08-08 NOTE — ED Notes (Signed)
Per EMS pt slipped resulting in fall/laceration to posterior head. Pt denies LOC or blood thinners.

## 2014-08-08 NOTE — ED Provider Notes (Signed)
CSN: [id number]     Arrival date & time 08/08/14  4098 History   First MD Initiated Contact with Patient 08/08/14 0818     Chief Complaint  Patient presents with  . Fall  . Head Laceration     (Consider location/radiation/quality/duration/timing/severity/associated sxs/prior Treatment) Patient is a 81 y.o. female presenting with head injury.  Head Injury Location:  Occipital Time since incident:  1 hour Mechanism of injury: fall   Pain details:    Quality:  Aching   Severity:  Mild   Timing:  Constant   Progression:  Unchanged Chronicity:  New Relieved by:  Rest Worsened by:  Nothing tried Associated symptoms: no headaches, no loss of consciousness, no nausea, no neck pain and no vomiting     Past Medical History  Diagnosis Date  . Essential hypertension     controlled  . Osteoporosis   . Persistent atrial fibrillation     probably permanent  . Vertigo   . Chronic renal insufficiency   . Hypothyroidism   . Arthritis   . Anxiety   . On home oxygen therapy     uses O2 @ 2l/m nasally bedtime  . Tachy-brady syndrome      treated with amiodarone and permanent DDD pacemaker, 1993  . GERD (gastroesophageal reflux disease)   . Atrial flutter   . Encounter for long-term (current) use of other medications     prior bleeding with coumadin, presently on asa  . Chronic diastolic heart failure   . Coronary atherosclerosis of unspecified type of vessel, native or graft     asymptomatic  . Anemia, unspecified   . Depression   . Retroperitoneal bleed 2005    LIFE THREATENING spontaneous retroperitoneal bleed on coumadin therapy 2005  . Respiratory failure 07/2011   Past Surgical History  Procedure Laterality Date  . Knee arthroscopy Right   . Total hip arthroplasty Right   . Esophagogastroduodenoscopy N/A 11/26/2012    Procedure: ESOPHAGOGASTRODUODENOSCOPY (EGD);  Surgeon: Charna Elizabeth, MD;  Location: [facility];  Service: Endoscopy;  Laterality: N/A;  . Joint replacement       rt hip  . Shoulder surgery    . Esophagogastroduodenoscopy N/A 03/08/2013    Procedure: ESOPHAGOGASTRODUODENOSCOPY (EGD);  Surgeon: Theda Belfast, MD;  Location: [facility];  Service: Endoscopy;  Laterality: N/A;  . Pacemaker insertion    . Cholecystectomy  10/08/1997    PPM implanted by Dr Amil Amen for tachy/brady syndrome  . Pacemaker generator change  6/10/20087    generator change Zephyr XL DR by Dr Amil Amen  . Femur im nail Left 07/17/2014    Procedure: INTRAMEDULLARY (IM) NAIL FEMORAL;  Surgeon: Samson Frederic, MD;  Location: [facility];  Service: Orthopedics;  Laterality: Left;   Family History  Problem Relation Age of Onset  . Hypertension Mother   . Heart disease Mother     Heart failure (vague history)   History  Substance Use Topics  . Smoking status: Never Smoker   . Smokeless tobacco: Never Used  . Alcohol Use: No   OB History    No data available     Review of Systems  Gastrointestinal: Negative for nausea and vomiting.  Musculoskeletal: Negative for neck pain.  Neurological: Negative for loss of consciousness and headaches.  All other systems reviewed and are negative.     Allergies  Quinidine; Tiazac; Warfarin sodium; Percocet; Promethazine hcl; Amiodarone; Procainamide; Warfarin and related; Amoxicillin; Ciprofloxacin; Polocaine; and Septra  Home Medications   Prior to Admission medications  Medication Sig Start Date End Date Taking? Authorizing Provider  acetaminophen (TYLENOL) 500 MG tablet Take 2 tablets (1,000 mg total) by mouth every 8 (eight) hours. 07/19/14   Amber Celedonio Savage, PA-C  acetaminophen (TYLENOL) 650 MG suppository Place 1 suppository (650 mg total) rectally every 8 (eight) hours. 07/19/14   Amber Celedonio Savage, PA-C  aspirin EC 325 MG EC tablet Take 1 tablet (325 mg total) by mouth daily with breakfast. 07/19/14   Dimitri Ped, PA-C  atenolol (TENORMIN) 25 MG tablet Take 1 tablet by mouth two  times daily 07/01/14   Lyn Records, MD    buPROPion (WELLBUTRIN XL) 300 MG 24 hr tablet Take 300 mg by mouth daily.    Historical Provider, MD  Cholecalciferol (VITAMIN D3) 2000 UNITS TABS Take 2,000 Units by mouth daily.    Historical Provider, MD  estradiol (ESTRACE) 0.1 MG/GM vaginal cream Place 2 g vaginally at bedtime.     Historical Provider, MD  famotidine (PEPCID) 20 MG tablet Take 20 mg by mouth 2 (two) times daily.    Historical Provider, MD  furosemide (LASIX) 80 MG tablet  once a day 07/22/14   Rodolph Bong, MD  hyoscyamine (LEVSIN SL) 0.125 MG SL tablet PLACE 1 TABLET (0.125 MG TOTAL) UNDER THE TONGUE EVERY 4 (FOUR) HOURS AS NEEDED FOR CRAMPING. 09/26/13   Ronnald Nian, MD  levothyroxine (SYNTHROID, LEVOTHROID) 88 MCG tablet Take 1 tablet by mouth  daily before breakfast 06/30/14   Ronnald Nian, MD  loperamide (IMODIUM A-D) 2 MG tablet Take 2-4 mg by mouth as needed for diarrhea or loose stools. Take 2 tablets at first loose stool, then take 1 tablet as needed for more diarrhea    Historical Provider, MD  methocarbamol (ROBAXIN) 500 MG tablet Take 1 tablet (500 mg total) by mouth every 6 (six) hours as needed for muscle spasms. 07/22/14   Rodolph Bong, MD  metolazone (ZAROXOLYN) 2.5 MG tablet Take 1 tablet (2.5 mg total) by mouth once a week. Take 30 minutes before Lasix on Mondays if patient's weight is greater than 3lbs from baseline, dry weight of 60kg. May also need to discuss with patient's daughter, [name]. 07/22/14   Rodolph Bong, MD  Multiple Vitamins-Minerals (ICAPS PO) Take 1 capsule by mouth daily.    Historical Provider, MD  mupirocin ointment (BACTROBAN) 2 %  12/27/13   Historical Provider, MD  NON FORMULARY Take 1 tablet by mouth daily. Ultra Flora IB once a day    Historical Provider, MD  omeprazole (PRILOSEC) 20 MG capsule Take 40 mg by mouth daily.     Historical Provider, MD  ondansetron (ZOFRAN) 4 MG tablet Take 4 mg by mouth every 6 (six) hours as needed for nausea or vomiting.    Historical  Provider, MD  potassium chloride SA (K-DUR,KLOR-CON) 20 MEQ tablet Take 2 tablets (40 mEq total) by mouth daily. 07/22/14   Rodolph Bong, MD  senna (SENOKOT) 8.6 MG TABS tablet Take 1 tablet (8.6 mg total) by mouth daily. 07/22/14   Rodolph Bong, MD  traMADol (ULTRAM) 50 MG tablet Take 1 tablet (50 mg total) by mouth every 6 (six) hours as needed for moderate pain. 07/19/14   Amber Celedonio Savage, PA-C  trimethoprim (TRIMPEX) 100 MG tablet Take 100 mg by mouth daily.    Historical Provider, MD   BP 131/80 mmHg  Pulse 88  Temp(Src) 98 F (36.7 C) (Oral)  SpO2 90% Physical Exam  Constitutional: She is oriented to person,  place, and time. She appears well-developed and well-nourished. No distress.  HENT:  Head: Normocephalic and atraumatic. Head is without raccoon's eyes and without Battle's sign.    Nose: Nose normal.  Eyes: Conjunctivae and EOM are normal. Pupils are equal, round, and reactive to light. No scleral icterus.  Neck: No spinous process tenderness and no muscular tenderness present.  Cardiovascular: Normal rate, regular rhythm, normal heart sounds and intact distal pulses.   No murmur heard. Pulmonary/Chest: Effort normal and breath sounds normal. She has no rales. She exhibits no tenderness.  Abdominal: Soft. There is no tenderness. There is no rebound and no guarding.  Musculoskeletal: Normal range of motion. She exhibits no edema or tenderness.       Thoracic back: She exhibits no tenderness and no bony tenderness.       Lumbar back: She exhibits no tenderness and no bony tenderness.  No evidence of trauma to extremities, except as noted.  2+ distal pulses.    L elbow: skin tear, full ROM of joint, no pain.    Neurological: She is alert and oriented to person, place, and time.  Skin: Skin is warm and dry. No rash noted.  Psychiatric: She has a normal mood and affect.  Nursing note and vitals reviewed.   ED Course  LACERATION REPAIR Date/Time: 08/08/2014 9:40  AM Performed by: Blake Divine Authorized by: Blake Divine Consent: Verbal consent obtained. Risks and benefits: risks, benefits and alternatives were discussed Consent given by: patient Body area: head/neck Location details: scalp Laceration length: 3 cm Foreign bodies: no foreign bodies Tendon involvement: none Nerve involvement: none Vascular damage: no Irrigation solution: saline Irrigation method: jet lavage Amount of cleaning: standard Debridement: none Degree of undermining: none Skin closure: staples Number of sutures: 3 Technique: simple Approximation: close Approximation difficulty: simple Dressing: 4x4 sterile gauze Patient tolerance: Patient tolerated the procedure well with no immediate complications   (including critical care time) Labs Review Labs Reviewed - No data to display  Imaging Review Ct Head Wo Contrast  08/08/2014   CLINICAL DATA:  Fall with head trauma and laceration. Neck pain. Initial encounter.  EXAM: CT HEAD WITHOUT CONTRAST  CT CERVICAL SPINE WITHOUT CONTRAST  TECHNIQUE: Multidetector CT imaging of the head and cervical spine was performed following the standard protocol without intravenous contrast. Multiplanar CT image reconstructions of the cervical spine were also generated.  COMPARISON:  12/26/2013  FINDINGS: CT HEAD FINDINGS  Skull and Sinuses:High posterior scalp laceration without underlying fracture or opaque foreign body.  Orbits: Bilateral cataract resection.  No traumatic findings.  Brain: No evidence of acute infarction, hemorrhage, hydrocephalus, or mass lesion/mass effect. Generalized atrophy which is stable from previous. Patchy small-vessel ischemic change throughout the bilateral deep and subcortical cerebral white matter, stable and expected for age.  CT CERVICAL SPINE FINDINGS  No evidence of cervical spine fracture. No new/traumatic malalignment- 2 mm of C2-3 anterolisthesis is stable from previous. There is diffuse degenerative disc  disease with left greater than right facet arthropathy and overgrowth. Uncovertebral and facet spurs cause multilevel foraminal narrowing on the left.  Chronic biapical lung scarring/fibrosis.  IMPRESSION: 1. No evidence of acute intracranial or cervical spine injury. 2. Posterior scalp laceration without fracture.   Electronically Signed   By: Marnee Spring M.D.   On: 08/08/2014 09:09   Ct Cervical Spine Wo Contrast  08/08/2014   CLINICAL DATA:  Fall with head trauma and laceration. Neck pain. Initial encounter.  EXAM: CT HEAD WITHOUT CONTRAST  CT CERVICAL  SPINE WITHOUT CONTRAST  TECHNIQUE: Multidetector CT imaging of the head and cervical spine was performed following the standard protocol without intravenous contrast. Multiplanar CT image reconstructions of the cervical spine were also generated.  COMPARISON:  12/26/2013  FINDINGS: CT HEAD FINDINGS  Skull and Sinuses:High posterior scalp laceration without underlying fracture or opaque foreign body.  Orbits: Bilateral cataract resection.  No traumatic findings.  Brain: No evidence of acute infarction, hemorrhage, hydrocephalus, or mass lesion/mass effect. Generalized atrophy which is stable from previous. Patchy small-vessel ischemic change throughout the bilateral deep and subcortical cerebral white matter, stable and expected for age.  CT CERVICAL SPINE FINDINGS  No evidence of cervical spine fracture. No new/traumatic malalignment- 2 mm of C2-3 anterolisthesis is stable from previous. There is diffuse degenerative disc disease with left greater than right facet arthropathy and overgrowth. Uncovertebral and facet spurs cause multilevel foraminal narrowing on the left.  Chronic biapical lung scarring/fibrosis.  IMPRESSION: 1. No evidence of acute intracranial or cervical spine injury. 2. Posterior scalp laceration without fracture.   Electronically Signed   By: Marnee SpringJonathon  Watts M.D.   On: 08/08/2014 09:09     EKG Interpretation None      MDM   Final  diagnoses:  Fall  Closed head injury, initial encounter  Scalp laceration, initial encounter  Skin tear of elbow without complication, left, initial encounter    81 year old female with mechanical fall. She slipped while wearing socks trying to get to the bathroom. She has a laceration to the back of her head and a skin tear to her left elbow. Otherwise no injuries identified by history or exam. Laceration repaired with staples. Plan DC back to assisted living facility.    Blake DivineJohn Shuan Statzer, MD 08/08/14 [phone]

## 2014-08-08 NOTE — Discharge Instructions (Signed)
Fall Prevention and Home Safety °Falls cause injuries and can affect all age groups. It is possible to use preventive measures to significantly decrease the likelihood of falls. There are many simple measures which can make your home safer and prevent falls. °OUTDOORS °· Repair cracks and edges of walkways and driveways. °· Remove high doorway thresholds. °· Trim shrubbery on the main path into your home. °· Have good outside lighting. °· Clear walkways of tools, rocks, debris, and clutter. °· Check that handrails are not broken and are securely fastened. Both sides of steps should have handrails. °· Have leaves, snow, and ice cleared regularly. °· Use sand or salt on walkways during winter months. °· In the garage, clean up grease or oil spills. °BATHROOM °· Install night lights. °· Install grab bars by the toilet and in the tub and shower. °· Use non-skid mats or decals in the tub or shower. °· Place a plastic non-slip stool in the shower to sit on, if needed. °· Keep floors dry and clean up all water on the floor immediately. °· Remove soap buildup in the tub or shower on a regular basis. °· Secure bath mats with non-slip, double-sided rug tape. °· Remove throw rugs and tripping hazards from the floors. °BEDROOMS °· Install night lights. °· Make sure a bedside light is easy to reach. °· Do not use oversized bedding. °· Keep a telephone by your bedside. °· Have a firm chair with side arms to use for getting dressed. °· Remove throw rugs and tripping hazards from the floor. °KITCHEN °· Keep handles on pots and pans turned toward the center of the stove. Use back burners when possible. °· Clean up spills quickly and allow time for drying. °· Avoid walking on wet floors. °· Avoid hot utensils and knives. °· Position shelves so they are not too high or low. °· Place commonly used objects within easy reach. °· If necessary, use a sturdy step stool with a grab bar when reaching. °· Keep electrical cables out of the  way. °· Do not use floor polish or wax that makes floors slippery. If you must use wax, use non-skid floor wax. °· Remove throw rugs and tripping hazards from the floor. °STAIRWAYS °· Never leave objects on stairs. °· Place handrails on both sides of stairways and use them. Fix any loose handrails. Make sure handrails on both sides of the stairways are as long as the stairs. °· Check carpeting to make sure it is firmly attached along stairs. Make repairs to worn or loose carpet promptly. °· Avoid placing throw rugs at the top or bottom of stairways, or properly secure the rug with carpet tape to prevent slippage. Get rid of throw rugs, if possible. °· Have an electrician put in a light switch at the top and bottom of the stairs. °OTHER FALL PREVENTION TIPS °· Wear low-heel or rubber-soled shoes that are supportive and fit well. Wear closed toe shoes. °· When using a stepladder, make sure it is fully opened and both spreaders are firmly locked. Do not climb a closed stepladder. °· Add color or contrast paint or tape to grab bars and handrails in your home. Place contrasting color strips on first and last steps. °· Learn and use mobility aids as needed. Install an electrical emergency response system. °· Turn on lights to avoid dark areas. Replace light bulbs that burn out immediately. Get light switches that glow. °· Arrange furniture to create clear pathways. Keep furniture in the same place. °·   Firmly attach carpet with non-skid or double-sided tape. °· Eliminate uneven floor surfaces. °· Select a carpet pattern that does not visually hide the edge of steps. °· Be aware of all pets. °OTHER HOME SAFETY TIPS °· Set the water temperature for 120° F (48.8° C). °· Keep emergency numbers on or near the telephone. °· Keep smoke detectors on every level of the home and near sleeping areas. °Document Released: 04/15/2002 Document Revised: 10/25/2011 Document Reviewed: 07/15/2011 °ExitCare® Patient Information ©2015  [employer]. This information is not intended to replace advice given to you by your health care provider. Make sure you discuss any questions you have with your health care provider. ° ° ° °Laceration Care, Adult °A laceration is a cut or lesion that goes through all layers of the skin and into the tissue just beneath the skin. °TREATMENT  °Some lacerations may not require closure. Some lacerations may not be able to be closed due to an increased risk of infection. It is important to see your caregiver as soon as possible after an injury to minimize the risk of infection and maximize the opportunity for successful closure. °If closure is appropriate, pain medicines may be given, if needed. The wound will be cleaned to help prevent infection. Your caregiver will use stitches (sutures), staples, wound glue (adhesive), or skin adhesive strips to repair the laceration. These tools bring the skin edges together to allow for faster healing and a better cosmetic outcome. However, all wounds will heal with a scar. Once the wound has healed, scarring can be minimized by covering the wound with sunscreen during the day for 1 full year. °HOME CARE INSTRUCTIONS  °For sutures or staples: °· Keep the wound clean and dry. °· If you were given a bandage (dressing), you should change it at least once a day. Also, change the dressing if it becomes wet or dirty, or as directed by your caregiver. °· Wash the wound with soap and water 2 times a day. Rinse the wound off with water to remove all soap. Pat the wound dry with a clean towel. °· After cleaning, apply a thin layer of the antibiotic ointment as recommended by your caregiver. This will help prevent infection and keep the dressing from sticking. °· You may shower as usual after the first 24 hours. Do not soak the wound in water until the sutures are removed. °· Only take over-the-counter or prescription medicines for pain, discomfort, or fever as directed by your  caregiver. °· Get your sutures or staples removed as directed by your caregiver. °For skin adhesive strips: °· Keep the wound clean and dry. °· Do not get the skin adhesive strips wet. You may bathe carefully, using caution to keep the wound dry. °· If the wound gets wet, pat it dry with a clean towel. °· Skin adhesive strips will fall off on their own. You may trim the strips as the wound heals. Do not remove skin adhesive strips that are still stuck to the wound. They will fall off in time. °For wound adhesive: °· You may briefly wet your wound in the shower or bath. Do not soak or scrub the wound. Do not swim. Avoid periods of heavy perspiration until the skin adhesive has fallen off on its own. After showering or bathing, gently pat the wound dry with a clean towel. °· Do not apply liquid medicine, cream medicine, or ointment medicine to your wound while the skin adhesive is in place. This may loosen the film before   your wound is healed. °· If a dressing is placed over the wound, be careful not to apply tape directly over the skin adhesive. This may cause the adhesive to be pulled off before the wound is healed. °· Avoid prolonged exposure to sunlight or tanning lamps while the skin adhesive is in place. Exposure to ultraviolet light in the first year will darken the scar. °· The skin adhesive will usually remain in place for 5 to 10 days, then naturally fall off the skin. Do not pick at the adhesive film. °You may need a tetanus shot if: °· You cannot remember when you had your last tetanus shot. °· You have never had a tetanus shot. °If you get a tetanus shot, your arm may swell, get red, and feel warm to the touch. This is common and not a problem. If you need a tetanus shot and you choose not to have one, there is a rare chance of getting tetanus. Sickness from tetanus can be serious. °SEEK MEDICAL CARE IF:  °· You have redness, swelling, or increasing pain in the wound. °· You see a red line that goes away  from the wound. °· You have yellowish-white fluid (pus) coming from the wound. °· You have a fever. °· You notice a bad smell coming from the wound or dressing. °· Your wound breaks open before or after sutures have been removed. °· You notice something coming out of the wound such as wood or glass. °· Your wound is on your hand or foot and you cannot move a finger or toe. °SEEK IMMEDIATE MEDICAL CARE IF:  °· Your pain is not controlled with prescribed medicine. °· You have severe swelling around the wound causing pain and numbness or a change in color in your arm, hand, leg, or foot. °· Your wound splits open and starts bleeding. °· You have worsening numbness, weakness, or loss of function of any joint around or beyond the wound. °· You develop painful lumps near the wound or on the skin anywhere on your body. °MAKE SURE YOU:  °· Understand these instructions. °· Will watch your condition. °· Will get help right away if you are not doing well or get worse. °Document Released: 04/25/2005 Document Revised: 07/18/2011 Document Reviewed: 10/19/2010 °ExitCare® Patient Information ©2015 [employer]. This information is not intended to replace advice given to you by your health care provider. Make sure you discuss any questions you have with your health care provider. ° °

## 2014-08-08 NOTE — ED Notes (Signed)
Bed: [id number] Expected date:  Expected time:  Means of arrival:  Comments: EMS- 81yo F, fall, head injury, no thinners

## 2014-08-15 ENCOUNTER — Non-Acute Institutional Stay (SKILLED_NURSING_FACILITY): Payer: MEDICARE | Admitting: Adult Health

## 2014-08-15 DIAGNOSIS — I10 Essential (primary) hypertension: Secondary | ICD-10-CM

## 2014-08-15 DIAGNOSIS — I4891 Unspecified atrial fibrillation: Secondary | ICD-10-CM | POA: Diagnosis not present

## 2014-08-15 DIAGNOSIS — I5032 Chronic diastolic (congestive) heart failure: Secondary | ICD-10-CM | POA: Diagnosis not present

## 2014-08-15 DIAGNOSIS — E039 Hypothyroidism, unspecified: Secondary | ICD-10-CM | POA: Diagnosis not present

## 2014-08-15 DIAGNOSIS — E876 Hypokalemia: Secondary | ICD-10-CM | POA: Diagnosis not present

## 2014-08-15 DIAGNOSIS — F329 Major depressive disorder, single episode, unspecified: Secondary | ICD-10-CM | POA: Diagnosis not present

## 2014-08-15 DIAGNOSIS — K5909 Other constipation: Secondary | ICD-10-CM | POA: Diagnosis not present

## 2014-08-15 DIAGNOSIS — S7292XS Unspecified fracture of left femur, sequela: Secondary | ICD-10-CM | POA: Diagnosis not present

## 2014-08-15 DIAGNOSIS — K219 Gastro-esophageal reflux disease without esophagitis: Secondary | ICD-10-CM

## 2014-08-15 DIAGNOSIS — F32A Depression, unspecified: Secondary | ICD-10-CM

## 2014-08-19 ENCOUNTER — Telehealth: Payer: Self-pay | Admitting: Internal Medicine

## 2014-08-19 NOTE — Telephone Encounter (Signed)
Harriett SineNancy OT with [facility] called stating that she has taken pt bp multiple times and the numbers are running 171/120,189/164,168/110. o2 is 95%, heartrate normal. She is having severe chest aching, abdominal pain, and side pain, no numbness and no slurred speech.   Spoke to Dr. Susann GivensLalonde and wanted me to advise pt to go to the ER and her bp should not be jumping around like that.'  Harriett Sineancy was notified to send her to ER

## 2014-08-21 ENCOUNTER — Telehealth: Payer: Self-pay | Admitting: Internal Medicine

## 2014-08-21 NOTE — Telephone Encounter (Signed)
gracie with [facility] with PT called needing an order for skilled nursing fax over to [phone]. Order needs to say  Skilled nursing for disease process teaching for respiratory failure and supplemental oxygen use.

## 2014-08-21 NOTE — Telephone Encounter (Signed)
Take care of this 

## 2014-08-22 NOTE — Telephone Encounter (Signed)
Faxed

## 2014-08-25 ENCOUNTER — Other Ambulatory Visit: Payer: Self-pay

## 2014-08-26 ENCOUNTER — Encounter: Payer: Self-pay | Admitting: Adult Health

## 2014-08-26 NOTE — Progress Notes (Signed)
Patient ID: [name], female   DOB: [birth year], 81 y.o.   MRN: [id number]   08/15/14  Facility:  Nursing Home Location:  [facility] Room Number: [id number] LEVEL OF CARE:  SNF (31)   Chief Complaint  Patient presents with  . Discharge Note    Left femur fracture S/P IM nailing, atrial fibrillation, hypertension, CHF, depression, hypokalemia, GERD, hypothyroidism and constipation    HISTORY OF PRESENT ILLNESS:  This is a 81 year old female who is for discharge home with home health PT for endurance and OT for ADLs. She has been admitted to [facility] on 07/22/14 from [facility]. She has PMH of essential hypertension, osteoporosis, persistent atrial fibrillation, vertigo, chronic renal insufficiency, hypothyroidism, arthritis, anxiety, on home oxygen therapy, tachy-bradycardia syndrome, GERD and atrial flutter. She had a fall at home and sustained a left intertrochanteric hip fracture. She had IM nailing done. She uses 2-3 L of oxygen at home. Her Lasix was recently decreased to 80 mg from 120 mg per day due to worsening renal function. She was seen by cardiology for preop and was diuresed. She was needing high amounts of O2 upon admission due to acute on chronic respiratory failure. Her O2 was eventually titrated down back to her home regimen of 3 L via nasal cannula and sats between 80-92%. She was discharged on home Lasix 80 mg by mouth daily.  Patient was admitted to this facility for short-term rehabilitation after the patient's recent hospitalization.  Patient has completed SNF rehabilitation and therapy has cleared the patient for discharge.  PAST MEDICAL HISTORY:  Past Medical History  Diagnosis Date  . Essential hypertension     controlled  . Osteoporosis   . Persistent atrial fibrillation     probably permanent  . Vertigo   . Chronic renal insufficiency   . Hypothyroidism   . Arthritis   . Anxiety   . On home oxygen therapy    uses O2 @ 2l/m nasally bedtime  . Tachy-brady syndrome      treated with amiodarone and permanent DDD pacemaker, 1993  . GERD (gastroesophageal reflux disease)   . Atrial flutter   . Encounter for long-term (current) use of other medications     prior bleeding with coumadin, presently on asa  . Chronic diastolic heart failure   . Coronary atherosclerosis of unspecified type of vessel, native or graft     asymptomatic  . Anemia, unspecified   . Depression   . Retroperitoneal bleed 2005    LIFE THREATENING spontaneous retroperitoneal bleed on coumadin therapy 2005  . Respiratory failure 07/2011    CURRENT MEDICATIONS: Reviewed per MAR/see medication list  Allergies  Allergen Reactions  . Quinidine Other (See Comments)    Skin peels  . Tiazac [Diltiazem Hcl] Hives    "don't remember how bad the reaction was" (04/24/2012)  . Warfarin Sodium Other (See Comments)    Life-threatening retroperitoneal bleed in 2005  . Percocet [Oxycodone-Acetaminophen] Other (See Comments)    confusion  . Promethazine Hcl Other (See Comments)    confusion  . Amiodarone Other (See Comments)    Lung problem per daughter  . Procainamide Other (See Comments)    Lupus reaction  . Warfarin And Related Other (See Comments)    Abdominal bleed  . Amoxicillin Rash  . Ciprofloxacin Rash  . Polocaine [Mepivacaine Hcl] Palpitations and Rash  . Septra [Sulfamethoxazole W/Trimethoprim (Co-Trimoxazole)] Rash and Other (See Comments)    Dizziness and tremor "don't  remember how bad the reaction was" (04/24/2012)     REVIEW OF SYSTEMS:  GENERAL: no change in appetite, no fatigue, no weight changes, no fever, chills or weakness RESPIRATORY: no cough, SOB, DOE, wheezing, hemoptysis CARDIAC: no chest pain, edema or palpitations GI: no abdominal pain, diarrhea, constipation, heart burn, nausea or vomiting  PHYSICAL EXAMINATION  GENERAL: no acute distress, normal body habitus EYES: conjunctivae normal, sclerae  normal, normal eye lids NECK: supple, trachea midline, no neck masses, no thyroid tenderness, no thyromegaly LYMPHATICS: no LAN in the neck, no supraclavicular LAN RESPIRATORY: breathing is even & unlabored, BS CTAB CARDIAC: RRR, no murmur,no extra heart sounds, no edema GI: abdomen soft, normal BS, no masses, no tenderness, no hepatomegaly, no splenomegaly EXTREMITIES: able to move X 4 extremities PSYCHIATRIC: the patient is alert & oriented to person, affect & behavior appropriate  LABS/RADIOLOGY: Labs reviewed: 08/14/14  sodium 139 potassium 4.3 glucose 80 BUN 18 creatinine 1.33 calcium 8.7 08/11/14  TSH 0.961 08/06/14  sodium 143 potassium 4.4 glucose 73 BUN 28 creatinine 1.81 calcium 9.6 08/05/14  WBC 12.4 hemoglobin 11.6 hematocrit 37.1 MCV 92.8 sodium 145 potassium 3.9 glucose 86 BUN 33 creatinine 1.92 calcium 9.7 07/29/14  sodium 139 potassium 4.1 glucose 76 BUN 25 creatinine 1.73 total bilirubin 0.8 alkaline phosphatase 109 SGOT 19 SGPT 13 total protein 5.3 albumin 3.0 calcium 9.3 Basic Metabolic Panel:  Recent Labs  16/10/96 0355  07/19/14 0918 07/20/14 0412 07/21/14 0415 07/22/14 0504  NA 138  < >  --  142 144 144  K 3.2*  < >  --  3.0* 3.4* 3.2*  CL 100  < >  --  97 97 97  CO2 31  < >  --  31 35* 33*  GLUCOSE 114*  < >  --  91 97 85  BUN 31*  < >  --  81* 79* 75*  CREATININE 1.77*  < >  --  1.59* 1.73* 1.71*  CALCIUM 8.4  < >  --  8.9 9.4 9.4  MG 2.0  --  2.4  --   --   --   < > = values in this interval not displayed. Liver Function Tests:  Recent Labs  07/12/14 0320  AST 22  ALT 13  ALKPHOS 83  BILITOT 0.8  PROT 6.1  ALBUMIN 3.6   CBC:  Recent Labs  07/11/14 2333 07/12/14 0320  07/16/14 0400  07/19/14 0405 07/20/14 0412 07/21/14 0415  WBC 13.6* 12.9*  < > 12.8*  < > 11.7* 11.5* 13.0*  NEUTROABS 11.1* 9.5*  --  9.7*  --   --   --   --   HGB 11.9* 11.3*  < > 11.5*  < > 9.3* 9.7* 9.9*  HCT 37.2 36.1  < > 36.9  < > 29.2* 31.1* 33.0*  MCV 91.9 91.6  <  > 92.0  < > 89.6 89.9 91.4  PLT 241 237  < > PLATELET CLUMPS NOTED ON SMEAR, COUNT APPEARS ADEQUATE  < > 287 347 394  < > = values in this interval not displayed. Cardiac Enzymes:  Recent Labs  07/12/14 0320 07/12/14 0914 07/12/14 1508  TROPONINI 0.11* 0.12* 0.11*      Ct Head Wo Contrast  08/08/2014   CLINICAL DATA:  Fall with head trauma and laceration. Neck pain. Initial encounter.  EXAM: CT HEAD WITHOUT CONTRAST  CT CERVICAL SPINE WITHOUT CONTRAST  TECHNIQUE: Multidetector CT imaging of the head and cervical spine was performed following the standard  protocol without intravenous contrast. Multiplanar CT image reconstructions of the cervical spine were also generated.  COMPARISON:  12/26/2013  FINDINGS: CT HEAD FINDINGS  Skull and Sinuses:High posterior scalp laceration without underlying fracture or opaque foreign body.  Orbits: Bilateral cataract resection.  No traumatic findings.  Brain: No evidence of acute infarction, hemorrhage, hydrocephalus, or mass lesion/mass effect. Generalized atrophy which is stable from previous. Patchy small-vessel ischemic change throughout the bilateral deep and subcortical cerebral white matter, stable and expected for age.  CT CERVICAL SPINE FINDINGS  No evidence of cervical spine fracture. No new/traumatic malalignment- 2 mm of C2-3 anterolisthesis is stable from previous. There is diffuse degenerative disc disease with left greater than right facet arthropathy and overgrowth. Uncovertebral and facet spurs cause multilevel foraminal narrowing on the left.  Chronic biapical lung scarring/fibrosis.  IMPRESSION: 1. No evidence of acute intracranial or cervical spine injury. 2. Posterior scalp laceration without fracture.   Electronically Signed   By: Marnee SpringJonathon  Watts M.D.   On: 08/08/2014 09:09   Ct Cervical Spine Wo Contrast  08/08/2014   CLINICAL DATA:  Fall with head trauma and laceration. Neck pain. Initial encounter.  EXAM: CT HEAD WITHOUT CONTRAST  CT  CERVICAL SPINE WITHOUT CONTRAST  TECHNIQUE: Multidetector CT imaging of the head and cervical spine was performed following the standard protocol without intravenous contrast. Multiplanar CT image reconstructions of the cervical spine were also generated.  COMPARISON:  12/26/2013  FINDINGS: CT HEAD FINDINGS  Skull and Sinuses:High posterior scalp laceration without underlying fracture or opaque foreign body.  Orbits: Bilateral cataract resection.  No traumatic findings.  Brain: No evidence of acute infarction, hemorrhage, hydrocephalus, or mass lesion/mass effect. Generalized atrophy which is stable from previous. Patchy small-vessel ischemic change throughout the bilateral deep and subcortical cerebral white matter, stable and expected for age.  CT CERVICAL SPINE FINDINGS  No evidence of cervical spine fracture. No new/traumatic malalignment- 2 mm of C2-3 anterolisthesis is stable from previous. There is diffuse degenerative disc disease with left greater than right facet arthropathy and overgrowth. Uncovertebral and facet spurs cause multilevel foraminal narrowing on the left.  Chronic biapical lung scarring/fibrosis.  IMPRESSION: 1. No evidence of acute intracranial or cervical spine injury. 2. Posterior scalp laceration without fracture.   Electronically Signed   By: Marnee SpringJonathon  Watts M.D.   On: 08/08/2014 09:09    ASSESSMENT/PLAN:  Left femur fracture S/P IM nailing - for home health PT and OT; continue tramadol 100 mg by mouth twice a day and tramadol 50 mg by mouth every 6 hours when necessary for pain; Robaxin 500 mg 1 tab by mouth every 6 hours when necessary for muscle spasm; and aspirin 325 mg by mouth daily 22 days for DVT prophylaxis Chronic diastolic heart failure - continue Lasix 80 mg by mouth daily, Zaroxolyn 2.5 mg 1 tab by mouth every weekly on Mondays Atrial fibrillation - rate controlled; continue atenolol 125 mg 1 tab by mouth twice a day; not a long-term anticoagulation candidate  secondary to prior bleeding on Coumadin Hypertension - well controlled; continue atenolol 25 mg 1 tab by mouth twice a day Constipation - continue senna S2 tabs by mouth daily at bedtime Depression - mood is stable; continue Wellbutrin XL 300 mg 1 tab by mouth daily GERD - continue Prilosec 40 mg by mouth daily and Pepcid 20 mg 1 tab by mouth twice a day Hypothyroidism -  TSH 0.961; continue Synthroid 88 g 1 tab by mouth daily Hypokalemia - K4.3; continue KCl 40  meq PO Q D    I have filled out patient's discharge paperwork and written prescriptions.  Patient will receive home health PT and OT.  Total discharge time: Less than 30 minutes  Discharge time involved coordination of the discharge process with Child psychotherapist, nursing staff and therapy department. Medical justification for home health services verified.     [facility], NP [employer] [phone]

## 2014-08-27 ENCOUNTER — Ambulatory Visit (INDEPENDENT_AMBULATORY_CARE_PROVIDER_SITE_OTHER): Payer: MEDICARE | Admitting: Internal Medicine

## 2014-08-27 ENCOUNTER — Ambulatory Visit (INDEPENDENT_AMBULATORY_CARE_PROVIDER_SITE_OTHER): Payer: MEDICARE | Admitting: Interventional Cardiology

## 2014-08-27 ENCOUNTER — Encounter: Payer: Self-pay | Admitting: Interventional Cardiology

## 2014-08-27 ENCOUNTER — Encounter: Payer: Self-pay | Admitting: Internal Medicine

## 2014-08-27 ENCOUNTER — Other Ambulatory Visit: Payer: Self-pay

## 2014-08-27 VITALS — BP 120/72 | HR 76 | Ht 62.0 in | Wt 121.8 lb

## 2014-08-27 DIAGNOSIS — I482 Chronic atrial fibrillation, unspecified: Secondary | ICD-10-CM

## 2014-08-27 DIAGNOSIS — N183 Chronic kidney disease, stage 3 unspecified: Secondary | ICD-10-CM

## 2014-08-27 DIAGNOSIS — I495 Sick sinus syndrome: Secondary | ICD-10-CM | POA: Diagnosis not present

## 2014-08-27 DIAGNOSIS — I5032 Chronic diastolic (congestive) heart failure: Secondary | ICD-10-CM

## 2014-08-27 DIAGNOSIS — I10 Essential (primary) hypertension: Secondary | ICD-10-CM | POA: Diagnosis not present

## 2014-08-27 DIAGNOSIS — Z950 Presence of cardiac pacemaker: Secondary | ICD-10-CM

## 2014-08-27 LAB — MDC_IDC_ENUM_SESS_TYPE_INCLINIC
Battery Impedance: 2400 Ohm
Brady Statistic RV Percent Paced: 14 %
Date Time Interrogation Session: 20160420125521
Implantable Pulse Generator Model: 5826
Implantable Pulse Generator Serial Number: 1904874
Lead Channel Impedance Value: 515 Ohm
Lead Channel Setting Sensing Sensitivity: 1.5 mV
MDC IDC MSMT BATTERY VOLTAGE: 2.75 V
MDC IDC MSMT LEADCHNL RV PACING THRESHOLD AMPLITUDE: 1.25 V
MDC IDC MSMT LEADCHNL RV PACING THRESHOLD PULSEWIDTH: 0.4 ms
MDC IDC MSMT LEADCHNL RV SENSING INTR AMPL: 5.4 mV
MDC IDC SET LEADCHNL RV PACING AMPLITUDE: 2.5 V
MDC IDC SET LEADCHNL RV PACING PULSEWIDTH: 0.4 ms

## 2014-08-27 LAB — BASIC METABOLIC PANEL
BUN: 20 mg/dL (ref 6–23)
CO2: 29 mEq/L (ref 19–32)
CREATININE: 1.34 mg/dL — AB (ref 0.40–1.20)
Calcium: 9.6 mg/dL (ref 8.4–10.5)
Chloride: 107 mEq/L (ref 96–112)
GFR: 39.28 mL/min — AB (ref >60.00)
GLUCOSE: 110 mg/dL — AB (ref 70–99)
Potassium: 3.9 mEq/L (ref 3.5–5.1)
Sodium: 138 mEq/L (ref 135–145)

## 2014-08-27 NOTE — Patient Instructions (Signed)
Medication Instructions:  Your physician recommends that you continue on your current medications as directed. Please refer to the Current Medication list given to you today.   Labwork: Bmet today   Testing/Procedures: None   Follow-Up: Your physician wants you to follow-up in: 6 months with Dr.Smith You will receive a reminder letter in the mail two months in advance. If you don't receive a letter, please call our office to schedule the follow-up appointment.   Any Other Special Instructions Will Be Listed Below (If Applicable).   

## 2014-08-27 NOTE — Progress Notes (Signed)
Electrophysiology Office Note   Date:  08/27/2014   ID:  [name], DOB [birth year], MRN [id number]  PCP:  Carollee Herter, MD  Cardiologist:  Dr Katrinka Blazing Primary Electrophysiologist: Hillis Range, MD    Chief Complaint  Patient presents with  . Atrial Fibrillation     History of Present Illness: [name] is a 81 y.o. female who presents today for electrophysiology evaluation.   She broke her hip after a mechanical fall in March.  She has been reluctant to be active.  She is making slow progress.  Has exertional SOB and occasional edema.   Today, she denies symptoms of palpitations, chest pain, orthopnea, PND, claudication, dizziness, presyncope, syncope, bleeding, or neurologic sequela. The patient is tolerating medications without difficulties and is otherwise without complaint today.    Past Medical History  Diagnosis Date  . Essential hypertension     controlled  . Osteoporosis   . Persistent atrial fibrillation     probably permanent  . Vertigo   . Chronic renal insufficiency   . Hypothyroidism   . Arthritis   . Anxiety   . On home oxygen therapy     uses O2 @ 2l/m nasally bedtime  . Tachy-brady syndrome      treated with amiodarone and permanent DDD pacemaker, 1993  . GERD (gastroesophageal reflux disease)   . Atrial flutter   . Encounter for long-term (current) use of other medications     prior bleeding with coumadin, presently on asa  . Chronic diastolic heart failure   . Coronary atherosclerosis of unspecified type of vessel, native or graft     asymptomatic  . Anemia, unspecified   . Depression   . Retroperitoneal bleed 2005    LIFE THREATENING spontaneous retroperitoneal bleed on coumadin therapy 2005  . Respiratory failure 07/2011   Past Surgical History  Procedure Laterality Date  . Knee arthroscopy Right   . Total hip arthroplasty Right   . Esophagogastroduodenoscopy N/A 11/26/2012    Procedure: ESOPHAGOGASTRODUODENOSCOPY (EGD);   Surgeon: Charna Elizabeth, MD;  Location: [facility]COPY;  Service: Endoscopy;  Laterality: N/A;  . Joint replacement      rt hip  . Shoulder surgery    . Esophagogastroduodenoscopy N/A 03/08/2013    Procedure: ESOPHAGOGASTRODUODENOSCOPY (EGD);  Surgeon: Theda Belfast, MD;  Location: [facility];  Service: Endoscopy;  Laterality: N/A;  . Pacemaker insertion    . Cholecystectomy  10/08/1997    PPM implanted by Dr Amil Amen for tachy/brady syndrome  . Pacemaker generator change  6/10/20087    generator change Zephyr XL DR by Dr Amil Amen  . Femur im nail Left 07/17/2014    Procedure: INTRAMEDULLARY (IM) NAIL FEMORAL;  Surgeon: Samson Frederic, MD;  Location: [facility] ORS;  Service: Orthopedics;  Laterality: Left;     Current Outpatient Prescriptions  Medication Sig Dispense Refill  . acetaminophen (TYLENOL) 500 MG tablet Take 2 tablets (1,000 mg total) by mouth every 8 (eight) hours. 30 tablet 0  . amLODipine (NORVASC) 5 MG tablet Take 5 mg by mouth daily.    Marland Kitchen aspirin EC 325 MG EC tablet Take 1 tablet (325 mg total) by mouth daily with breakfast. (Patient taking differently: Take 325 mg by mouth daily with breakfast. DOSE CHANGE TO 81 MG ON 09-05-14 (08-27-14)) 30 tablet 0  . atenolol (TENORMIN) 25 MG tablet Take 1 tablet by mouth two  times daily 180 tablet 1  . buPROPion (WELLBUTRIN XL) 300 MG 24 hr tablet Take 300 mg by mouth  daily.    . Cholecalciferol (VITAMIN D3) 2000 UNITS TABS Take 2,000 Units by mouth daily.    Marland Kitchen estradiol (ESTRACE) 0.1 MG/GM vaginal cream Place 2 g vaginally at bedtime.     . famotidine (PEPCID) 20 MG tablet Take 20 mg by mouth 2 (two) times daily.    . furosemide (LASIX) 80 MG tablet  once a day 30 tablet 0  . hyoscyamine (LEVSIN SL) 0.125 MG SL tablet PLACE 1 TABLET (0.125 MG TOTAL) UNDER THE TONGUE EVERY 4 (FOUR) HOURS AS NEEDED FOR CRAMPING. 15 tablet 0  . levothyroxine (SYNTHROID, LEVOTHROID) 88 MCG tablet Take 1 tablet by mouth  daily before breakfast 90 tablet 1  .  loperamide (IMODIUM A-D) 2 MG tablet Take 2-4 mg by mouth as needed for diarrhea or loose stools. Take 2 tablets at first loose stool, then take 1 tablet as needed for more diarrhea    . methocarbamol (ROBAXIN) 500 MG tablet Take 1 tablet (500 mg total) by mouth every 6 (six) hours as needed for muscle spasms. 30 tablet 0  . metolazone (ZAROXOLYN) 2.5 MG tablet Take 1 tablet (2.5 mg total) by mouth once a week. Take 30 minutes before Lasix on Mondays if patient's weight is greater than 3lbs from baseline, dry weight of 60kg. May also need to discuss with patient's daughter, [name]. 12 tablet 0  . Multiple Vitamins-Minerals (ICAPS PO) Take 1 capsule by mouth daily.    . NON FORMULARY Take 1 tablet by mouth daily. Ultra Flora IB once a day    . omeprazole (PRILOSEC) 20 MG capsule Take 40 mg by mouth daily.     . ondansetron (ZOFRAN) 4 MG tablet Take 4 mg by mouth every 6 (six) hours as needed for nausea or vomiting.    . potassium chloride SA (K-DUR,KLOR-CON) 20 MEQ tablet Take 2 tablets (40 mEq total) by mouth daily. 60 tablet 0  . traMADol (ULTRAM) 50 MG tablet Take 1 tablet (50 mg total) by mouth every 6 (six) hours as needed for moderate pain. 60 tablet 0  . trimethoprim (TRIMPEX) 100 MG tablet Take 100 mg by mouth daily.     No current facility-administered medications for this visit.    Allergies:   Quinidine; Tiazac; Warfarin sodium; Percocet; Promethazine hcl; Amiodarone; Procainamide; Warfarin and related; Amoxicillin; Ciprofloxacin; Polocaine; and Septra   Social History:  The patient  reports that she has never smoked. She has never used smokeless tobacco. She reports that she does not drink alcohol or use illicit drugs.   Family History:  The patient's family history includes Bladder Cancer in her father; Heart disease in her mother; Hypertension in her brother, brother, brother, mother, and sister.    ROS:  Please see the history of present illness.   All other systems are reviewed and  negative.    PHYSICAL EXAM: VS:  BP 120/72 mmHg  Pulse 76  Ht  (1.575 m)  Wt 121 lb 12.8 oz (55.248 kg)  BMI 22.27 kg/m2 , BMI Body mass index is 22.27 kg/(m^2). GEN: elderly, in no acute distress HEENT: normal Neck: no JVD, carotid bruits, or masses Cardiac: iRRR; no murmurs, rubs, or gallops,+edema  Respiratory:  clear to auscultation bilaterally, normal work of breathing GI: soft, nontender, nondistended, + BS MS: walks slowly with a rolling walker Skin: warm and dry, device pocket is well healed Neuro:  Strength and sensation are intact Psych: euthymic mood, full affect  Device interrogation is reviewed today in detail.  See PaceArt  for details.   Recent Labs: 07/12/2014: ALT 13 07/14/2014: TSH 0.670 07/15/2014: B Natriuretic Peptide 359.3* 07/19/2014: Magnesium 2.4 07/21/2014: Hemoglobin 9.9*; Platelets 394 07/22/2014: BUN 75*; Creatinine 1.71*; Potassium 3.2*; Sodium 144    Lipid Panel  No results found for: CHOL, TRIG, HDL, CHOLHDL, VLDL, LDLCALC, LDLDIRECT   Wt Readings from Last 3 Encounters:  08/27/14 121 lb 12.8 oz (55.248 kg)  08/15/14 122 lb 12.8 oz (55.702 kg)  07/22/14 129 lb 3 oz (58.6 kg)     ASSESSMENT AND PLAN:  1.  Permanent afib Poor candidate for anticoagulation  2. Symptomatic bradycardia Normal pacemaker function See Pace Art report No changes today  3. SOB To see Dr Katrinka BlazingSmith today, will defer  Current medicines are reviewed at length with the patient today.   The patient does not have concerns regarding her medicines.  The following changes were made today:  none   Follow-up with Dr Katrinka BlazingSmith as scheduled, return to see EP NP in 1 year  Signed, Hillis RangeJames Coby Shrewsberry, MD  08/27/2014 11:51 AM     [facility] [address] [place] [address] [phone] (office) [phone] (fax)

## 2014-08-27 NOTE — Progress Notes (Signed)
Cardiology Office Note   Date:  08/27/2014   ID:  AVE SCHARNHORST, DOB [birth year], MRN [id number]  PCP:  Carollee Herter, MD  Cardiologist:   Lesleigh Noe, MD   Chief Complaint  Patient presents with  . Labs Only      History of Present Illness: [name] is a 81 y.o. female who presents for chronic diastolic heart failure, chronic hypoxia, essential hypertension, chronic atrial fibrillation, chronic O2 therapy, atrial fibrillation, tachybradycardia syndrome with DDD pacemaker.  Recently hospitalized with a fractured right hip. She has gone through rehabilitation at [facility], and is now back at home. She has lost approximately 14 pounds from the mid 130s down to 118 pounds. Appetite is not good although it is improving. She has had no neurological complaints. She walks with a 4 pronged walker.    Past Medical History  Diagnosis Date  . Essential hypertension     controlled  . Osteoporosis   . Persistent atrial fibrillation     probably permanent  . Vertigo   . Chronic renal insufficiency   . Hypothyroidism   . Arthritis   . Anxiety   . On home oxygen therapy     uses O2 @ 2l/m nasally bedtime  . Tachy-brady syndrome      treated with amiodarone and permanent DDD pacemaker, 1993  . GERD (gastroesophageal reflux disease)   . Atrial flutter   . Encounter for long-term (current) use of other medications     prior bleeding with coumadin, presently on asa  . Chronic diastolic heart failure   . Coronary atherosclerosis of unspecified type of vessel, native or graft     asymptomatic  . Anemia, unspecified   . Depression   . Retroperitoneal bleed 2005    LIFE THREATENING spontaneous retroperitoneal bleed on coumadin therapy 2005  . Respiratory failure 07/2011    Past Surgical History  Procedure Laterality Date  . Knee arthroscopy Right   . Total hip arthroplasty Right   . Esophagogastroduodenoscopy N/A 11/26/2012    Procedure:  ESOPHAGOGASTRODUODENOSCOPY (EGD);  Surgeon: Charna Elizabeth, MD;  Location: [facility];  Service: Endoscopy;  Laterality: N/A;  . Joint replacement      rt hip  . Shoulder surgery    . Esophagogastroduodenoscopy N/A 03/08/2013    Procedure: ESOPHAGOGASTRODUODENOSCOPY (EGD);  Surgeon: Theda Belfast, MD;  Location: [facility];  Service: Endoscopy;  Laterality: N/A;  . Pacemaker insertion    . Cholecystectomy  10/08/1997    PPM implanted by Dr Amil Amen for tachy/brady syndrome  . Pacemaker generator change  6/10/20087    generator change Zephyr XL DR by Dr Amil Amen  . Femur im nail Left 07/17/2014    Procedure: INTRAMEDULLARY (IM) NAIL FEMORAL;  Surgeon: Samson Frederic, MD;  Location: [facility];  Service: Orthopedics;  Laterality: Left;     Current Outpatient Prescriptions  Medication Sig Dispense Refill  . acetaminophen (TYLENOL) 500 MG tablet Take 2 tablets (1,000 mg total) by mouth every 8 (eight) hours. 30 tablet 0  . amLODipine (NORVASC) 5 MG tablet Take 5 mg by mouth daily.    Marland Kitchen aspirin EC 325 MG EC tablet Take 1 tablet (325 mg total) by mouth daily with breakfast. (Patient taking differently: Take 325 mg by mouth daily with breakfast. DOSE CHANGE TO 81 MG ON 09-05-14 (08-27-14)) 30 tablet 0  . atenolol (TENORMIN) 25 MG tablet Take 1 tablet by mouth two  times daily 180 tablet 1  . buPROPion (WELLBUTRIN XL) 300  MG 24 hr tablet Take 300 mg by mouth daily.    . Cholecalciferol (VITAMIN D3) 2000 UNITS TABS Take 2,000 Units by mouth daily.    Marland Kitchen. estradiol (ESTRACE) 0.1 MG/GM vaginal cream Place 2 g vaginally at bedtime.     . famotidine (PEPCID) 20 MG tablet Take 20 mg by mouth 2 (two) times daily.    . furosemide (LASIX) 80 MG tablet 80mg  once a day 30 tablet 0  . hyoscyamine (LEVSIN SL) 0.125 MG SL tablet PLACE 1 TABLET (0.125 MG TOTAL) UNDER THE TONGUE EVERY 4 (FOUR) HOURS AS NEEDED FOR CRAMPING. 15 tablet 0  . levothyroxine (SYNTHROID, LEVOTHROID) 88 MCG tablet Take 1 tablet by mouth  daily  before breakfast 90 tablet 1  . loperamide (IMODIUM A-D) 2 MG tablet Take 2-4 mg by mouth as needed for diarrhea or loose stools. Take 2 tablets at first loose stool, then take 1 tablet as needed for more diarrhea    . methocarbamol (ROBAXIN) 500 MG tablet Take 1 tablet (500 mg total) by mouth every 6 (six) hours as needed for muscle spasms. 30 tablet 0  . metolazone (ZAROXOLYN) 2.5 MG tablet Take 1 tablet (2.5 mg total) by mouth once a week. Take 30 minutes before Lasix on Mondays if patient's weight is greater than 3lbs from baseline, dry weight of 60kg. May also need to discuss with patient's daughter, [name]. 12 tablet 0  . Multiple Vitamins-Minerals (ICAPS PO) Take 1 capsule by mouth daily.    . NON FORMULARY Take 1 tablet by mouth daily. Ultra Flora IB once a day    . omeprazole (PRILOSEC) 20 MG capsule Take 40 mg by mouth daily.     . ondansetron (ZOFRAN) 4 MG tablet Take 4 mg by mouth every 6 (six) hours as needed for nausea or vomiting.    . potassium chloride SA (K-DUR,KLOR-CON) 20 MEQ tablet Take 2 tablets (40 mEq total) by mouth daily. 60 tablet 0  . traMADol (ULTRAM) 50 MG tablet Take 1 tablet (50 mg total) by mouth every 6 (six) hours as needed for moderate pain. 60 tablet 0  . trimethoprim (TRIMPEX) 100 MG tablet Take 100 mg by mouth daily.     No current facility-administered medications for this visit.    Allergies:   Quinidine; Tiazac; Warfarin sodium; Percocet; Promethazine hcl; Amiodarone; Procainamide; Warfarin and related; Amoxicillin; Ciprofloxacin; Polocaine; and Septra    Social History:  The patient  reports that she has never smoked. She has never used smokeless tobacco. She reports that she does not drink alcohol or use illicit drugs.   Family History:  The patient's family history includes Bladder Cancer in her father; Heart disease in her mother; Hypertension in her brother, brother, brother, mother, and sister.    ROS:  Please see the history of present illness.    Otherwise, review of systems are positive for decreased appetite, dizziness, weakness, and dyspnea without oxygen.   All other systems are reviewed and negative.    PHYSICAL EXAM: VS:  BP 120/72 mmHg  Pulse 76  Ht 5\' 2"  (1.575 m)  Wt 121 lb 12.8 oz (55.248 kg)  BMI 22.27 kg/m2 , BMI Body mass index is 22.27 kg/(m^2). GEN: Well nourished, well developed, in no acute distress HEENT: normal Neck: no JVD, carotid bruits, or masses Cardiac: Irregular RR; no murmurs, rubs, or gallops,no edema  Respiratory:  clear to auscultation bilaterally, normal work of breathing GI: soft, nontender, nondistended, + BS MS: no deformity or atrophy Skin: warm  and dry, no rash Neuro:  Strength and sensation are intact Psych: euthymic mood, full affect   EKG:  EKG is not ordered today.    Recent Labs: 07/12/2014: ALT 13 07/14/2014: TSH 0.670 07/15/2014: B Natriuretic Peptide 359.3* 07/19/2014: Magnesium 2.4 07/21/2014: Hemoglobin 9.9*; Platelets 394 07/22/2014: BUN 75*; Creatinine 1.71*; Potassium 3.2*; Sodium 144    Lipid Panel No results found for: CHOL, TRIG, HDL, CHOLHDL, VLDL, LDLCALC, LDLDIRECT    Wt Readings from Last 3 Encounters:  08/27/14 121 lb 12.8 oz (55.248 kg)  08/27/14 121 lb 12.8 oz (55.248 kg)  08/15/14 122 lb 12.8 oz (55.702 kg)      Other studies Reviewed: Additional studies/ records that were reviewed today include: . Review of the above records demonstrates   ASSESSMENT AND PLAN:  Chronic diastolic heart failure - no clinical evidence of volume overload today  Chronic kidney disease, stage III (moderate): Current status not known  Essential hypertension, controlled  Chronic atrial fibrillation: Controlled rate  Pacemaker  Tachycardia-bradycardia syndrome     Current medicines are reviewed at length with the patient today.  The patient does not have concerns regarding medicines.  The following changes have been made:  no change  Labs/ tests ordered today  include:   Orders Placed This Encounter  Procedures  . Basic metabolic panel     Disposition:   FU with HS in 3 months  Signed, Lesleigh Noe, MD  08/27/2014 1:18 PM    [facility] [address] [place], [place], [place]  [address] Phone: [phone]; Fax: [phone]

## 2014-08-27 NOTE — Patient Instructions (Signed)
Medication Instructions:  No changes made  Labwork: None ordered  Testing/Procedures: None ordered  Follow-Up: Your physician wants you to follow-up in: 12 months with Dr Jacquiline DoeAllred You will receive a reminder letter in the mail two months in advance. If you don't receive a letter, please call our office to schedule the follow-up appointment.     Any Other Special Instructions Will Be Listed Below (If Applicable).

## 2014-08-28 ENCOUNTER — Telehealth: Payer: Self-pay

## 2014-08-28 NOTE — Telephone Encounter (Signed)
called to give pt daughter [name] to give lab results.lmtcb

## 2014-08-28 NOTE — Telephone Encounter (Signed)
-----   Message from Lyn RecordsHenry W Smith, MD sent at 08/27/2014  6:09 PM EDT ----- Kidney function is near normal. Potassium is normal

## 2014-09-22 ENCOUNTER — Telehealth: Payer: Self-pay

## 2014-09-22 NOTE — Telephone Encounter (Signed)
gracey with [facility] wanting verbal ok to see pt at home and extend PT 2x a week for 4 weeks. Pt is slow but steady progress. Is this okay to give verbal order?

## 2014-09-23 NOTE — Telephone Encounter (Signed)
Okay to give a verbal order

## 2014-09-23 NOTE — Telephone Encounter (Signed)
Called Gracie with [facility] with verbal order for continued PT per Dr. Susann GivensLalonde.

## 2014-10-14 ENCOUNTER — Other Ambulatory Visit: Payer: Self-pay | Admitting: Adult Health

## 2014-10-17 ENCOUNTER — Ambulatory Visit (INDEPENDENT_AMBULATORY_CARE_PROVIDER_SITE_OTHER): Payer: MEDICARE

## 2014-10-17 ENCOUNTER — Ambulatory Visit (INDEPENDENT_AMBULATORY_CARE_PROVIDER_SITE_OTHER): Payer: MEDICARE | Admitting: Podiatry

## 2014-10-17 ENCOUNTER — Telehealth: Payer: Self-pay | Admitting: Family Medicine

## 2014-10-17 DIAGNOSIS — R52 Pain, unspecified: Secondary | ICD-10-CM

## 2014-10-17 DIAGNOSIS — M79672 Pain in left foot: Secondary | ICD-10-CM

## 2014-10-17 DIAGNOSIS — M109 Gout, unspecified: Secondary | ICD-10-CM

## 2014-10-17 DIAGNOSIS — B351 Tinea unguium: Secondary | ICD-10-CM | POA: Diagnosis not present

## 2014-10-17 DIAGNOSIS — M79676 Pain in unspecified toe(s): Secondary | ICD-10-CM

## 2014-10-17 DIAGNOSIS — M10072 Idiopathic gout, left ankle and foot: Secondary | ICD-10-CM

## 2014-10-17 LAB — CBC WITH DIFFERENTIAL/PLATELET
BASOS ABS: 0 10*3/uL (ref 0.0–0.1)
Basophils Relative: 0 % (ref 0–1)
Eosinophils Absolute: 0.1 10*3/uL (ref 0.0–0.7)
Eosinophils Relative: 1 % (ref 0–5)
HEMATOCRIT: 40.7 % (ref 36.0–46.0)
HEMOGLOBIN: 13 g/dL (ref 12.0–15.0)
LYMPHS PCT: 17 % (ref 12–46)
Lymphs Abs: 2 10*3/uL (ref 0.7–4.0)
MCH: 28.9 pg (ref 26.0–34.0)
MCHC: 31.9 g/dL (ref 30.0–36.0)
MCV: 90.4 fL (ref 78.0–100.0)
MPV: 9.7 fL (ref 8.6–12.4)
Monocytes Absolute: 1 10*3/uL (ref 0.1–1.0)
Monocytes Relative: 9 % (ref 3–12)
NEUTROS ABS: 8.4 10*3/uL — AB (ref 1.7–7.7)
NEUTROS PCT: 73 % (ref 43–77)
Platelets: 321 10*3/uL (ref 150–400)
RBC: 4.5 MIL/uL (ref 3.87–5.11)
RDW: 16.5 % — ABNORMAL HIGH (ref 11.5–15.5)
WBC: 11.5 10*3/uL — ABNORMAL HIGH (ref 4.0–10.5)

## 2014-10-17 MED ORDER — CLINDAMYCIN HCL 300 MG PO CAPS: 300.0000 mg | ORAL_CAPSULE | Freq: Three times a day (TID) | ORAL | Status: DC

## 2014-10-17 NOTE — Telephone Encounter (Signed)
Gracie, [email or web address] , requesting verbal order to continue home health PT once a week for one week then twice a week for 6 weeks. Call Gracie back @ [phone]

## 2014-10-18 LAB — SEDIMENTATION RATE: Sed Rate: 14 mm/hr (ref 0–30)

## 2014-10-18 LAB — URIC ACID: URIC ACID, SERUM: 11 mg/dL — AB (ref 2.4–7.0)

## 2014-10-18 LAB — C-REACTIVE PROTEIN: CRP: 2.9 mg/dL — ABNORMAL HIGH (ref <0.60)

## 2014-10-19 NOTE — Telephone Encounter (Signed)
Call in the verbal order

## 2014-10-20 ENCOUNTER — Telehealth: Payer: Self-pay | Admitting: *Deleted

## 2014-10-20 MED ORDER — COLCHICINE 0.6 MG PO TABS: 0.6000 mg | ORAL_TABLET | Freq: Every day | ORAL | Status: DC

## 2014-10-20 NOTE — Telephone Encounter (Addendum)
Left message on [name]'s cellphone to call for lab results.  Dr. Ardelle Anton called in Colchicine after reviewing labs with uric acid 11.0.  Informed Danielle. Melton of Dr. Gabriel Rung orders and that Dr. Susann Givens should be able to pull labs up in [facility].

## 2014-10-20 NOTE — Telephone Encounter (Signed)
Left word for word  verbal orders on voicemail

## 2014-10-23 ENCOUNTER — Telehealth: Payer: Self-pay | Admitting: Interventional Cardiology

## 2014-10-23 NOTE — Telephone Encounter (Signed)
New Prob   Pts daughter states pt was diagnosed with Gout yesterday. Provider wanting to start pt on Colchicine, however, daughter is calling to make sure this is okay given the other medications pt is currently taking. Please call.

## 2014-10-23 NOTE — Progress Notes (Signed)
Subjective: 81 year old female presents the office caregiver for concerns of left second digit possible infection. She states that she presented been placed on antibiotic and help resolve some of the redness that she had a stab of her foot although the left second digit remains erythematous and slightly edematous. The patient does that she has some pain in this area. They deny any pus from around the toenail or any open lesions. Also states that she has painful, elongated toenails which is and also trim herself. Denies any redness or drainage along the other toenails. No other complaints at this time. On keflex currently.   Objective: AAO 3, NAD DP/PT pulses palpable, CRT less than 3 seconds Protective sensation intact with Derrel Nip Monofilament Left second digit there is edema and erythema from the distal aspect of the digit to the level of the MTPJ. There is no ascending cellulitis. There is no areas of fluctuance or crepitus. On the lateral border of the left hallux toenail does appear to be some incurvation of the nail. Upon debridement the nail there was some white, thick material expressed in the area which appeared to be tophi. There is no purulence expressed. There is no malodor.  Nails are hypertrophic, dystrophic, brittle, discolored, elongated 10. There is no surrounding erythema or drainage the nail sites except for otherwise stated There is tenderness palpation along nails 1-5 bilaterally. No other open lesions or pre-ulcerative lesions identified bilaterally. No pain with calf compression, swelling, warmth, erythema.  Assessment: 81 year old female with symptomatic onychomycosis, left second digit gout versus infection  Plan: -Treatment options discussed including all alternatives, risks, and complications -X-rays were obtained and reviewed with the patient.  -Nail shop and debrided 10 without complications/bleeding -Upon debridement of the left second digit toenail that  was white tophi expressed.  -Will obtain uric acid, CBC, ESR -Will changed clindamycin for now due to the amount of erythema and edema and case of infection. It appears that the rest of the "cellulitis" resolved on keflex but some still remains -Follow-up in 2 weeks, or sooner should any problems arise. In the meantime call the office with any questions, concerns, change in symptoms.

## 2014-10-24 NOTE — Telephone Encounter (Signed)
Returned pt daughter call. Adv her per Adair Laundry It would be ok for pt to take colchicine. They would be no concern for drug interaction with pt current medication regimen.  Pt daughter sts that pt is doing well she rqst an update be fwd to Dr.Smith

## 2014-10-31 ENCOUNTER — Ambulatory Visit (INDEPENDENT_AMBULATORY_CARE_PROVIDER_SITE_OTHER): Payer: MEDICARE | Admitting: Podiatry

## 2014-10-31 DIAGNOSIS — M109 Gout, unspecified: Secondary | ICD-10-CM

## 2014-10-31 DIAGNOSIS — M10072 Idiopathic gout, left ankle and foot: Secondary | ICD-10-CM | POA: Diagnosis not present

## 2014-10-31 NOTE — Patient Instructions (Signed)
If redness has not resolved in the next 2 weeks, please call the office.  Monitor for any signs/symptoms of infection. Call the office immediately if any occur or go directly to the emergency room. Call with any questions/concerns.

## 2014-11-01 NOTE — Progress Notes (Signed)
Patient ID: Tora Duck, female   DOB: [birth year], 82 y.o.   MRN: [id number]  Subjective: 81 year old female presents to the office with her caregiver for follow up evaluation of left second toe infection/gout. She states that since last appointment she has completed the course of colchicine as well as antibiotic. She states that since last appointment areas gotten considerably better compared to what it was. There is still some residual redness of the toe and edema however does not painful or as significant as what it was previously. She denies any systemic complaints as fevers, chills, nausea, vomiting. No other complaints at this time in no acute changes since last appointment.  Objective: AAO 3, NAD DP/PT pulses palpable, CRT less than 3 seconds Protective sensation intact with Simms once the monofilament On the left second digit there is significant decrease in edema and erythema compared to what it was last appointment. There is still some residual erythema on the distal aspect of the toe. There is no tenderness to palpation over this area on the toenail. There is no gouty tophi identified at this time. There is no ascending cellulitis, flexion, crepitus, malodor. The erythema along the proximal digit and along the forefoot has resolved. Is no increase in warmth to the area. No malodor. No other areas of tenderness to bilateral lower she needs. No open lesions or pre-ulcerative lesions are identified elsewhere. No pain with calf compression, swelling, warmth, erythema.  Assessment: 82 year old female follow-up evaluation of left second digit gout/cellulitis  Plan: -Treatment options discussed including all alternatives, risks, and complications -At this time the symptoms appear to be considerably improved compared to what was previous a. She has finished her course of colchicine and antibiotic. I do believe this was gout as her uric acid is increased. Continue to monitor area. The  erythema has not completely resolved in the next 1-2 weeks to call the office for follow-up appointment or sooner if any problems are to arise or there is any worsening. In the meantime I encouraged her to call the office with any questions, concerns, change in symptoms.  Ovid Curd, DPM

## 2014-11-18 ENCOUNTER — Other Ambulatory Visit: Payer: Self-pay

## 2014-11-18 ENCOUNTER — Telehealth: Payer: Self-pay | Admitting: Interventional Cardiology

## 2014-11-18 DIAGNOSIS — I5032 Chronic diastolic (congestive) heart failure: Secondary | ICD-10-CM

## 2014-11-18 NOTE — Telephone Encounter (Signed)
New message        Pt c/o swelling: STAT is pt has developed SOB within 24 hours  1. How long have you been experiencing swelling? 1 week   2. Where is the swelling located? Ankles and feet  3.  Are you currently taking a "fluid pill"? Lasix   And adding zaroxlyn   4.  Are you currently SOB? Only when active  5.  Have you traveled recently?No  Per pt daughter, pt was given extra diaretic for 1 week.  Pt weight is not going down and oxygen is low. Oxygen is running 90% Weight is 122 right now  Please call to advise

## 2014-11-18 NOTE — Telephone Encounter (Signed)
Pt daughter sts that pt weights has increased by 4 lbs. Pt dry weigh is 118lb. She has been weighing between 121/122lb the last wk. Pt is taking lasix 80mg  qd. She was given metolazone 2.5mg  on 7/9. Her weight went down by 1lb and went right back up to 122lb the next day. Pt is on 2-3 liter of O2. Her O2 stats have been 88-90% on O2. Pt has increased LE swelling. Pt daughter gave her an additional 80mg  of lasix at bedtime.pt had no weight loss. Adv pt daughter [name] I will update Dr.Smith and call back with his recommendation.

## 2014-11-18 NOTE — Addendum Note (Signed)
Addended by: Jarvis NewcomerPARRIS-GODLEY, LISA S on: 11/18/2014 05:54 PM   Modules accepted: Orders

## 2014-11-18 NOTE — Telephone Encounter (Signed)
Pt daughter [name] aware of Dr.Smith's recommendation. Pt should be given Metolazone daily for the next 3 days. Stop if weight returns to baseline sooner. Pt should come to the office on 7/16 for a bmet. Pr daughter instructed to call the office if there is no weight loss or improvement of symptoms. Dawn verbalized understanding.

## 2014-11-18 NOTE — Telephone Encounter (Signed)
Returned pt daughter call.lmtcb Will attempt to call pt daughter [name] again

## 2014-11-21 ENCOUNTER — Other Ambulatory Visit: Payer: Self-pay | Admitting: Interventional Cardiology

## 2014-11-21 ENCOUNTER — Other Ambulatory Visit: Payer: Self-pay | Admitting: Family Medicine

## 2014-11-25 ENCOUNTER — Other Ambulatory Visit (INDEPENDENT_AMBULATORY_CARE_PROVIDER_SITE_OTHER): Payer: MEDICARE

## 2014-11-25 ENCOUNTER — Other Ambulatory Visit: Payer: Self-pay | Admitting: Interventional Cardiology

## 2014-11-25 DIAGNOSIS — I5032 Chronic diastolic (congestive) heart failure: Secondary | ICD-10-CM

## 2014-11-25 LAB — BASIC METABOLIC PANEL
BUN: 36 mg/dL — ABNORMAL HIGH (ref 6–23)
CHLORIDE: 99 meq/L (ref 96–112)
CO2: 26 meq/L (ref 19–32)
Calcium: 9.8 mg/dL (ref 8.4–10.5)
Creatinine, Ser: 2.07 mg/dL — ABNORMAL HIGH (ref 0.40–1.20)
GFR: 23.77 mL/min — ABNORMAL LOW (ref >60.00)
Glucose, Bld: 121 mg/dL — ABNORMAL HIGH (ref 70–99)
POTASSIUM: 3.5 meq/L (ref 3.5–5.1)
Sodium: 137 mEq/L (ref 135–145)

## 2014-12-01 ENCOUNTER — Telehealth: Payer: Self-pay

## 2014-12-01 NOTE — Telephone Encounter (Signed)
Called pt daughter [name] to give lab results and ck on pt. lmtcb

## 2014-12-01 NOTE — Telephone Encounter (Signed)
  Pt daughter [name] aware of lab results. Pt daughter sts that pt is doing well. Her sob and swelling has improved, her weight is down. Her O2 stats have come up. Update fwd to Dr.Smith. Dawn verbalized understanding.

## 2014-12-01 NOTE — Telephone Encounter (Signed)
-----   Message from Lyn Records, MD sent at 11/30/2014  3:53 PM EDT ----- BUN and Creat are elevated. How is patient doing from weight and breathing standpoint.  I wonder why this was not reviewed?

## 2014-12-01 NOTE — Telephone Encounter (Signed)
Follow Up       Pt's daughter returning Lisa's phone call.

## 2014-12-05 ENCOUNTER — Ambulatory Visit
Admission: RE | Admit: 2014-12-05 | Discharge: 2014-12-05 | Disposition: A | Discharge status: Home / Self Care | Payer: MEDICARE | Source: Ambulatory Visit | Attending: Medical | Admitting: Medical

## 2014-12-05 ENCOUNTER — Telehealth: Payer: Self-pay | Admitting: Interventional Cardiology

## 2014-12-05 ENCOUNTER — Ambulatory Visit (INDEPENDENT_AMBULATORY_CARE_PROVIDER_SITE_OTHER): Payer: MEDICARE | Admitting: Medical

## 2014-12-05 ENCOUNTER — Encounter: Payer: Self-pay | Admitting: Medical

## 2014-12-05 ENCOUNTER — Other Ambulatory Visit: Payer: Self-pay | Admitting: Medical

## 2014-12-05 VITALS — BP 110/80 | HR 81 | Temp 97.1°F | Resp 12

## 2014-12-05 DIAGNOSIS — M79604 Pain in right leg: Secondary | ICD-10-CM

## 2014-12-05 DIAGNOSIS — W19XXXA Unspecified fall, initial encounter: Secondary | ICD-10-CM

## 2014-12-05 DIAGNOSIS — M25551 Pain in right hip: Secondary | ICD-10-CM

## 2014-12-05 NOTE — Progress Notes (Signed)
   Subjective: Here for hip injury/fall, accompanied by daughters.  She was at her home this morning, felt a little weak in her legs, tripped over the oxygen tank hose, landed on right leg/hip.  Called the [facility] to advise of fall, but she was able to get up by herself and walk to the couch shortly after the fall . Her daughter ended up coming to get her.   She currently reports mild pain in right mid leg and some in hip, pain with hip ROM.   She has had hip replacement on the right.  No bruising.  Using Tramadol for pain.  No other aggravating or relieving factors. No other complaint.  Past Surgical History  Procedure Laterality Date  . Knee arthroscopy Right   . Total hip arthroplasty Right   . Esophagogastroduodenoscopy N/A 11/26/2012    Procedure: ESOPHAGOGASTRODUODENOSCOPY (EGD);  Surgeon: Charna Elizabeth, MD;  Location: [facility];  Service: Endoscopy;  Laterality: N/A;  . Joint replacement      rt hip  . Shoulder surgery    . Esophagogastroduodenoscopy N/A 03/08/2013    Procedure: ESOPHAGOGASTRODUODENOSCOPY (EGD);  Surgeon: Theda Belfast, MD;  Location: [facility];  Service: Endoscopy;  Laterality: N/A;  . Pacemaker insertion    . Cholecystectomy  10/08/1997    PPM implanted by Dr Amil Amen for tachy/brady syndrome  . Pacemaker generator change  6/10/20087    generator change Zephyr XL DR by Dr Amil Amen  . Femur im nail Left 07/17/2014    Procedure: INTRAMEDULLARY (IM) NAIL FEMORAL;  Surgeon: Samson Frederic, MD;  Location: [facility];  Service: Orthopedics;  Laterality: Left;   ROS as in subjective   Objective BP 110/80 mmHg  Pulse 81  Temp(Src) 97.1 F (36.2 C) (Oral)  Resp 12  Gen: wd, wn, nad, seated in wheel chair, on oxygen 24/7 Skin: no bruising of right leg or hip Tender over right hip and right lateral leg, there is a step off of right buttock/hip region and scar from hip surgery suggesting some muscle atrophy of gluteus maximus and buttocks, no obvious deformity otherwise.   There is pain with internal right hip ROM and decreased right hip internal ROM, not particularly tender with external ROM.  Left hip and leg nontender No swelling Legs neurovascularly intact   Assessment: Encounter Diagnoses  Name Primary?  . Fall, initial encounter Yes  . Right leg pain   . Right hip pain     Plan: Go for xray.  discussed findings, fall prevention, avoidence of re injury.   Can use Ultram for pain, ice prn for 15 min at a time and with cloth in between ice and skin, rest over the weekend.   We will call with xray results.    Call her daughter [name] regarding results.  [phone]

## 2014-12-05 NOTE — Telephone Encounter (Signed)
New message    Calling to ask Dr.Smith to order  portable oxygen concentrators.

## 2014-12-08 NOTE — Telephone Encounter (Signed)
I approve portable oxygen concentrator

## 2014-12-08 NOTE — Telephone Encounter (Signed)
Routed to Dr.Smith to approve

## 2014-12-09 NOTE — Telephone Encounter (Signed)
Order for portable oxygen concentrator faxed to [facility]

## 2015-01-19 IMAGING — CT CT HEAD W/O CM
4 of 6 series · 16 of 47 positions shown, 18 images · non-contrast
Comparison: CT scan of October 18, 2010.

CLINICAL DATA: Knee pain.

EXAM:
CT HEAD WITHOUT CONTRAST
CT CERVICAL SPINE WITHOUT CONTRAST
TECHNIQUE: Multidetector CT imaging of the head and cervical spine was
performed following the standard protocol without intravenous
contrast. Multiplanar CT image reconstructions of the cervical spine
were also generated.

[Series 3: head 2.0 h70h · axial · 0.42mm/px · z∈[-123,-99]mm · 2 of 83 slices shown]
[im 12/83  brain]
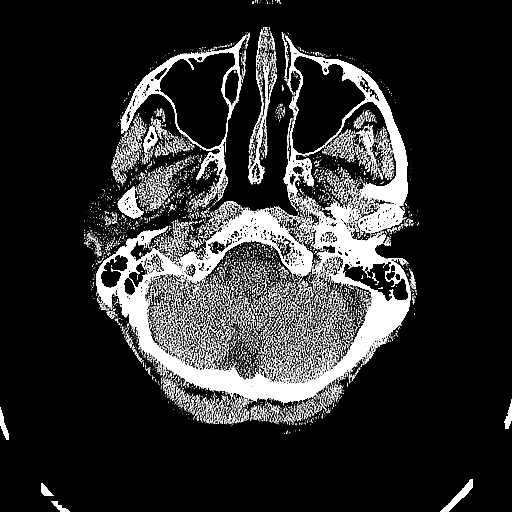
[im 24/83  brain]
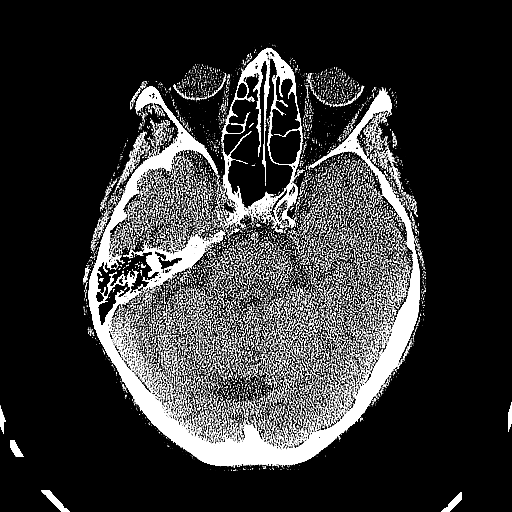

[Series 7: coronals · coronal · 0.28mm/px · 3 of 83 slices shown]
[im 28/83  brain]
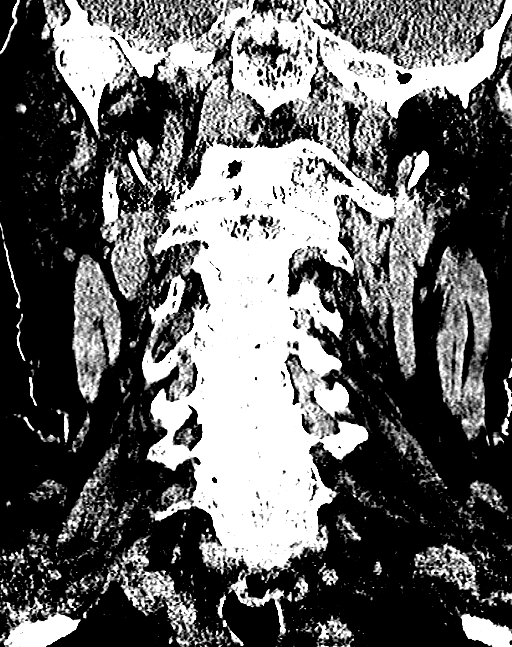
[im 37/83  brain]
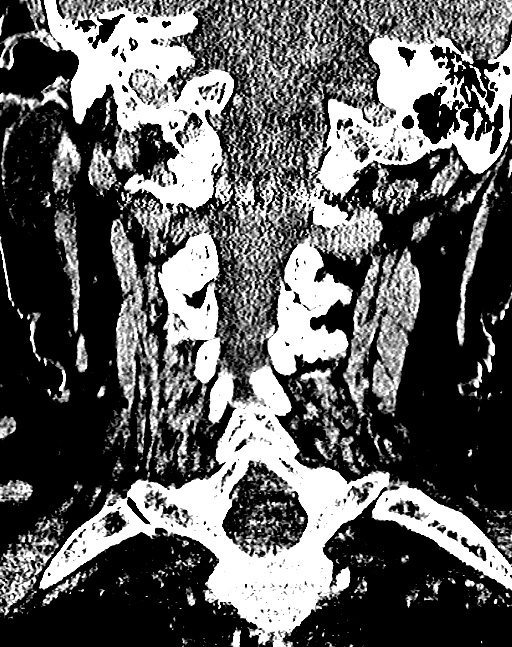
[im 46/83  brain]
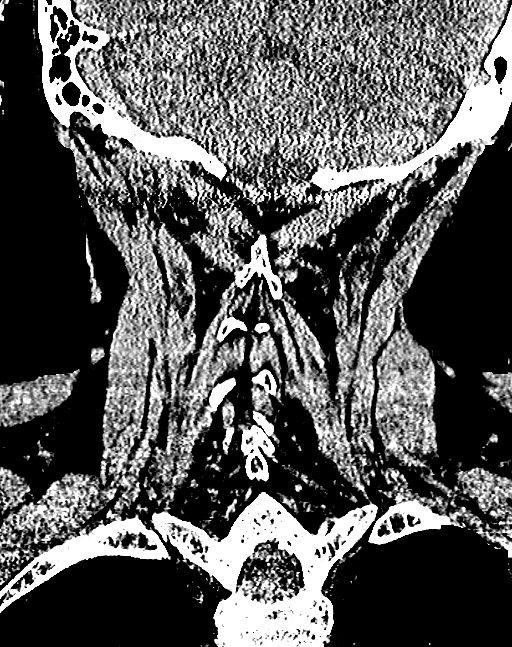

[Series 8: sagittals · sagittal · 0.28mm/px · 3 of 83 slices shown]
[im 28/83  brain]
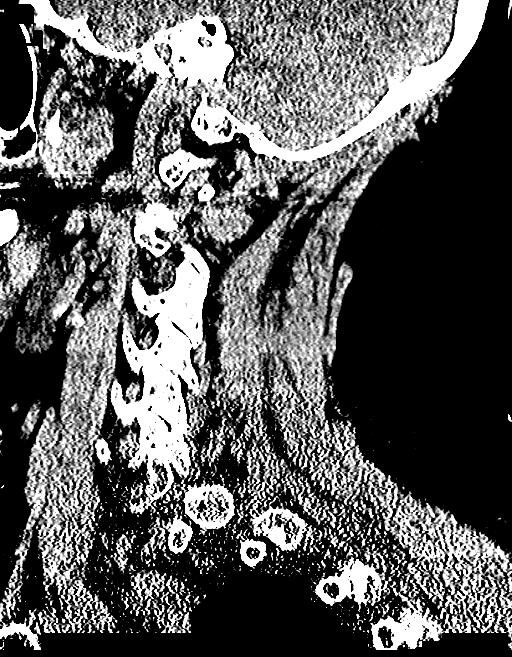
[im 42/83  brain]
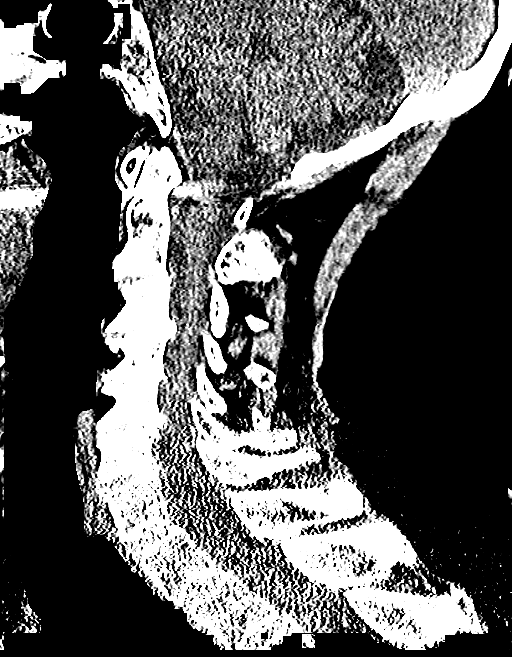
[im 55/83  brain]
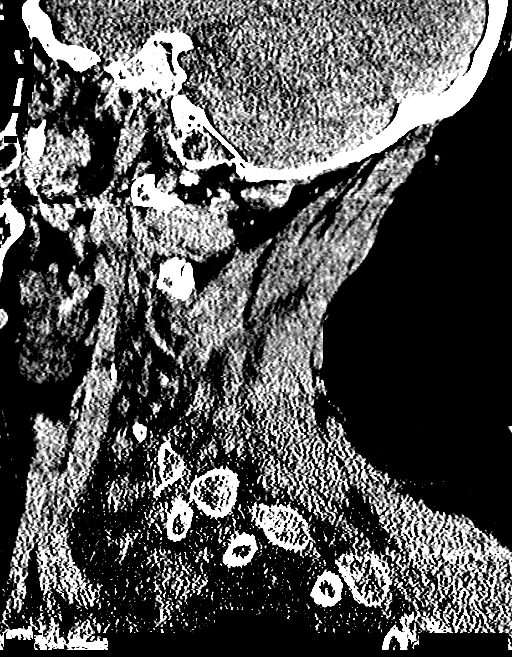

[Series 9: orthogonals · axial · 0.32mm/px · z∈[-306,-160]mm · 8 of 97 slices shown, 10 images]
[im 11/97  brain]
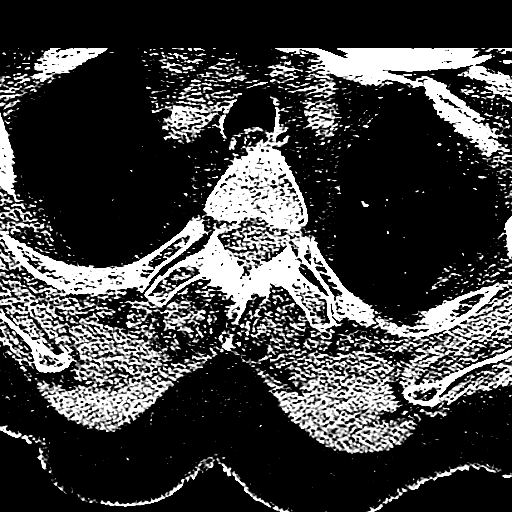
[im 11/97  bone]
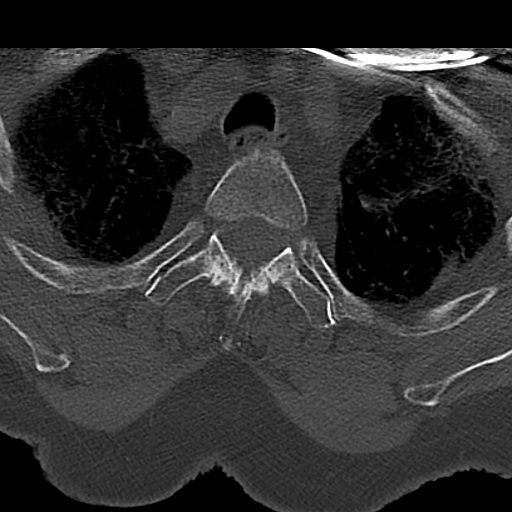
[im 22/97  brain]
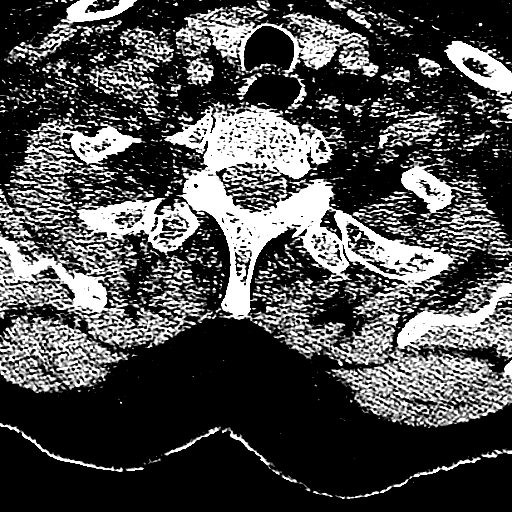
[im 33/97  brain]
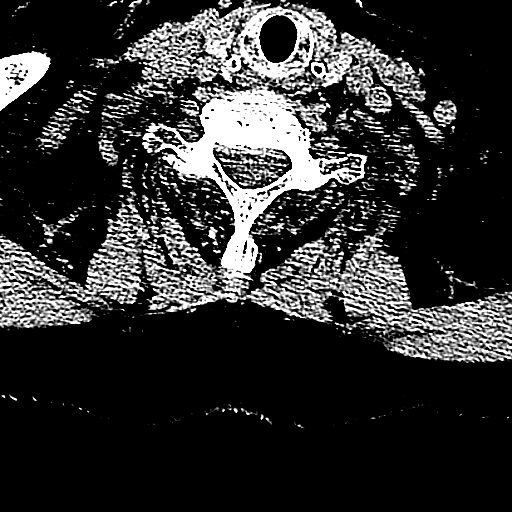
[im 43/97  brain]
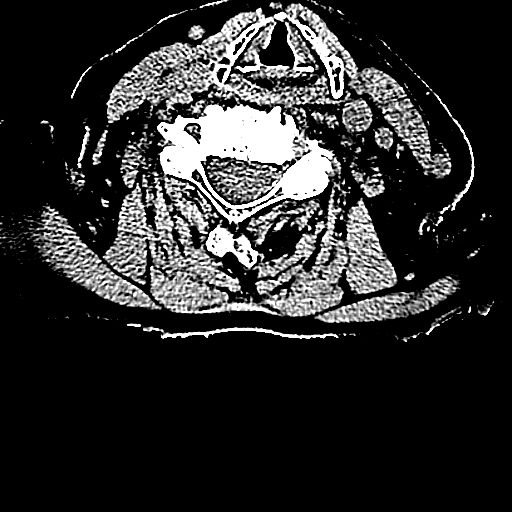
[im 54/97  brain]
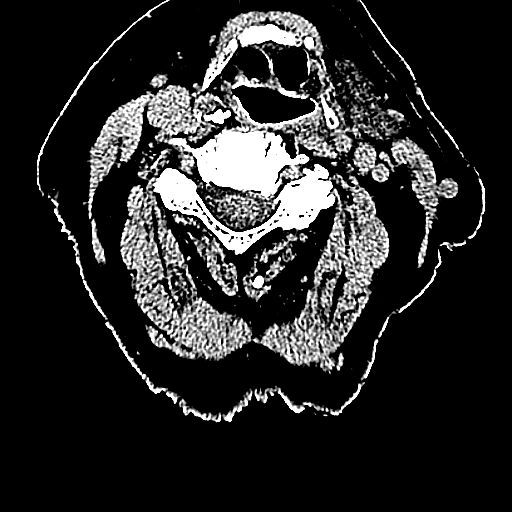
[im 54/97  bone]
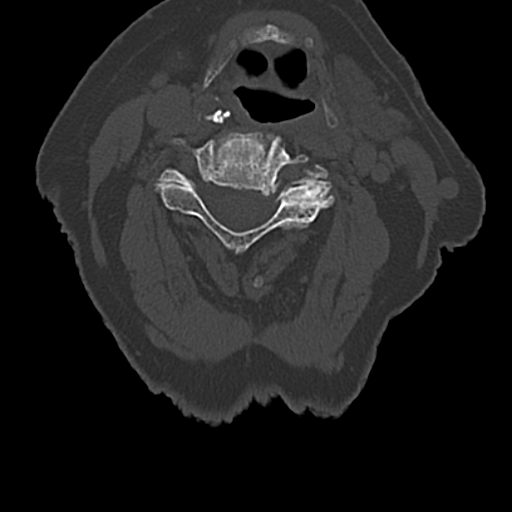
[im 65/97  brain]
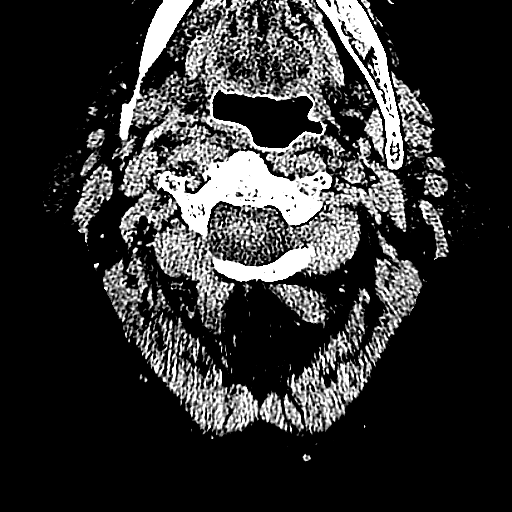
[im 75/97  brain]
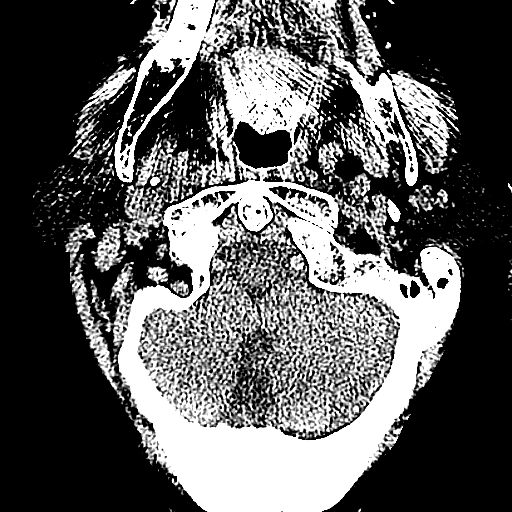
[im 86/97  brain]
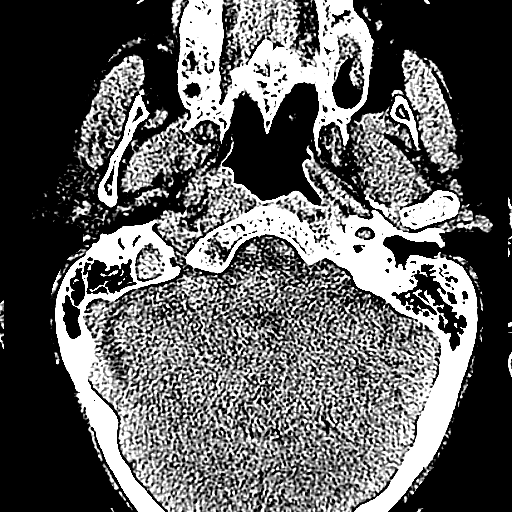

[16 of 47 positions shown; findings below may reference images not displayed]

FINDINGS: CT HEAD FINDINGS

Bony calvarium appears intact. Diffuse cortical atrophy is noted.
Mild chronic ischemic white matter disease is noted. No mass effect
or midline shift is noted. Ventricular size is within normal limits.
There is no evidence of mass lesion, hemorrhage or acute infarction.

CT CERVICAL SPINE FINDINGS

No fracture or spondylolisthesis is noted. Severe degenerative disc
disease is noted at C3-4 and C4-5. Mild degenerative disc disease is
noted at C5-6 and C6-7. Mild dextroscoliosis of cervical spine is
noted. Hypertrophy of posterior facet joints on the left is noted
secondary to degenerative change.
IMPRESSION: Diffuse cortical atrophy. Mild chronic ischemic white matter
disease. No acute intracranial abnormality seen.

Multilevel degenerative disc disease of the cervical spine. No acute
abnormality seen in the cervical spine.

## 2015-03-06 ENCOUNTER — Telehealth: Payer: Self-pay | Admitting: Interventional Cardiology

## 2015-03-06 DIAGNOSIS — Z79899 Other long term (current) drug therapy: Secondary | ICD-10-CM

## 2015-03-06 NOTE — Telephone Encounter (Signed)
New message     Daughter states pt gained 5lbs, was coughing and had sob.  She gave pt an additional 80mg  of lasix for 2 days, stopped a day---pt was still coughing, and on the 4th day, gave another 80mg  of lasix in addition to other medications. Pt has an appt with her PCP next week and an appt with Dr Katrinka BlazingSmith on 11-9.  Do you want to order labs and have PCP draw them so that when pt sees Dr Katrinka BlazingSmith, he will have them?

## 2015-03-06 NOTE — Telephone Encounter (Signed)
Spoke with daughter and patients weight down 4 pounds and cough/shortness of breath better Labs requested to check kidney functions when she goes to PCP next week for flu vaccine Labs placed in Epic and advised daughter additional labs may be ordered when patient sees Dr Katrinka BlazingSmith Advised to call back if any further problems

## 2015-03-09 ENCOUNTER — Other Ambulatory Visit: Payer: MEDICARE

## 2015-03-09 DIAGNOSIS — Z79899 Other long term (current) drug therapy: Secondary | ICD-10-CM

## 2015-03-09 DIAGNOSIS — Z23 Encounter for immunization: Secondary | ICD-10-CM

## 2015-03-09 LAB — BASIC METABOLIC PANEL
BUN: 46 mg/dL — ABNORMAL HIGH (ref 7–25)
CHLORIDE: 97 mmol/L — AB (ref 98–110)
CO2: 28 mmol/L (ref 20–31)
Calcium: 9.5 mg/dL (ref 8.6–10.4)
Creat: 2.27 mg/dL — ABNORMAL HIGH (ref 0.60–0.88)
Glucose, Bld: 101 mg/dL — ABNORMAL HIGH (ref 65–99)
POTASSIUM: 3.3 mmol/L — AB (ref 3.5–5.3)
Sodium: 138 mmol/L (ref 135–146)

## 2015-03-18 ENCOUNTER — Ambulatory Visit (INDEPENDENT_AMBULATORY_CARE_PROVIDER_SITE_OTHER): Payer: MEDICARE | Admitting: Interventional Cardiology

## 2015-03-18 ENCOUNTER — Encounter: Payer: Self-pay | Admitting: Interventional Cardiology

## 2015-03-18 VITALS — BP 106/62 | HR 74 | Ht 62.0 in | Wt 121.8 lb

## 2015-03-18 DIAGNOSIS — I495 Sick sinus syndrome: Secondary | ICD-10-CM | POA: Diagnosis not present

## 2015-03-18 DIAGNOSIS — N183 Chronic kidney disease, stage 3 unspecified: Secondary | ICD-10-CM

## 2015-03-18 DIAGNOSIS — I10 Essential (primary) hypertension: Secondary | ICD-10-CM

## 2015-03-18 DIAGNOSIS — I5032 Chronic diastolic (congestive) heart failure: Secondary | ICD-10-CM | POA: Diagnosis not present

## 2015-03-18 DIAGNOSIS — I482 Chronic atrial fibrillation, unspecified: Secondary | ICD-10-CM

## 2015-03-18 DIAGNOSIS — Z950 Presence of cardiac pacemaker: Secondary | ICD-10-CM

## 2015-03-18 NOTE — Patient Instructions (Signed)
Medication Instructions:  Your physician has recommended you make the following change in your medication: 1) HOLD Amlodipine until instructed to resume 2) TAKE Metolazone 2.5mg  Fri, Mon, Wed. We will instruct you how to continue after we receive your lab results   Labwork: Your physician recommends that you return for lab work on 03/26/15 (Bmet)   Testing/Procedures: None ordered  Follow-Up: Your physician wants you to follow-up in: 6 months with Dr.Smith You will receive a reminder letter in the mail two months in advance. If you don't receive a letter, please call our office to schedule the follow-up appointment.   Any Other Special Instructions Will Be Listed Below (If Applicable).     If you need a refill on your cardiac medications before your next appointment, please call your pharmacy.

## 2015-03-18 NOTE — Progress Notes (Signed)
Cardiology Office Note   Date:  03/18/2015   ID:  [name], DOB 31-Mar-1920, MRN [id number]  PCP:  Carollee Herter, MD  Cardiologist:  Lesleigh Noe, MD   Chief Complaint  Patient presents with  . Congestive Heart Failure      History of Present Illness: [name] is a 81 y.o. female who presents for  Chronic atrial fibrillation, diastolic heart failure, elderly and frail, essential hypertension, chronic kidney disease, chronic O2 therapy, tachycardia-bradycardia syndrome, an chronic anemia.  Accompanied by both daughters. Has had progressive increasing O2 requirement to maintain her oxygen saturations greater than 90 percent. She denies angina. Appetite is been relatively stable. She has difficulty lying down.  Past Medical History  Diagnosis Date  . Essential hypertension     controlled  . Osteoporosis   . Persistent atrial fibrillation (HCC)     probably permanent  . Vertigo   . Chronic renal insufficiency   . Hypothyroidism   . Arthritis   . Anxiety   . On home oxygen therapy     uses O2 @ 2l/m nasally bedtime  . Tachy-brady syndrome (HCC)      treated with amiodarone and permanent DDD pacemaker, 1993  . GERD (gastroesophageal reflux disease)   . Atrial flutter (HCC)   . Encounter for long-term (current) use of other medications     prior bleeding with coumadin, presently on asa  . Chronic diastolic heart failure (HCC)   . Coronary atherosclerosis of unspecified type of vessel, native or graft     asymptomatic  . Anemia, unspecified   . Depression   . Retroperitoneal bleed 2005    LIFE THREATENING spontaneous retroperitoneal bleed on coumadin therapy 2005  . Respiratory failure (HCC) 07/2011    Past Surgical History  Procedure Laterality Date  . Knee arthroscopy Right   . Total hip arthroplasty Right   . Esophagogastroduodenoscopy N/A 11/26/2012    Procedure: ESOPHAGOGASTRODUODENOSCOPY (EGD);  Surgeon: Charna Elizabeth, MD;  Location: [facility]  ENDOSCOPY;  Service: Endoscopy;  Laterality: N/A;  . Joint replacement      rt hip  . Shoulder surgery    . Esophagogastroduodenoscopy N/A 03/08/2013    Procedure: ESOPHAGOGASTRODUODENOSCOPY (EGD);  Surgeon: Theda Belfast, MD;  Location: [facility];  Service: Endoscopy;  Laterality: N/A;  . Pacemaker insertion    . Cholecystectomy  10/08/1997    PPM implanted by Dr Amil Amen for tachy/brady syndrome  . Pacemaker generator change  6/10/20087    generator change Zephyr XL DR by Dr Amil Amen  . Femur im nail Left 07/17/2014    Procedure: INTRAMEDULLARY (IM) NAIL FEMORAL;  Surgeon: Samson Frederic, MD;  Location: [facility] ORS;  Service: Orthopedics;  Laterality: Left;     Current Outpatient Prescriptions  Medication Sig Dispense Refill  . acetaminophen (TYLENOL) 500 MG tablet Take 2 tablets (1,000 mg total) by mouth every 8 (eight) hours. 30 tablet 0  . amLODipine (NORVASC) 5 MG tablet Take 1 tablet by mouth once a day 90 tablet 3  . aspirin 81 MG tablet Take 81 mg by mouth daily.    Marland Kitchen atenolol (TENORMIN) 25 MG tablet Take 1 tablet by mouth two  times daily 180 tablet 3  . buPROPion (WELLBUTRIN XL) 300 MG 24 hr tablet Take 300 mg by mouth daily.    . Cholecalciferol (VITAMIN D3) 2000 UNITS TABS Take 2,000 Units by mouth daily.    . famotidine (PEPCID) 20 MG tablet Take 20 mg by mouth 2 (two) times  daily.    . furosemide (LASIX) 80 MG tablet Take 80 mg by mouth daily.    Marland Kitchen. levothyroxine (SYNTHROID, LEVOTHROID) 88 MCG tablet Take 1 tablet by mouth  daily before breakfast 90 tablet 0  . loperamide (IMODIUM A-D) 2 MG tablet Take 2-4 mg by mouth as needed for diarrhea or loose stools. Take 2 tablets at first loose stool, then take 1 tablet as needed for more diarrhea    . metolazone (ZAROXOLYN) 2.5 MG tablet Take 1 tablet (2.5 mg total) by mouth once a week. Take 30 minutes before Lasix on Mondays if patient's weight is greater than 3lbs from baseline, dry weight of 60kg. May also need to discuss with  patient's daughter, [name]. 12 tablet 0  . Multiple Vitamins-Minerals (ICAPS PO) Take 1 capsule by mouth daily.    . NON FORMULARY Take 1 tablet by mouth daily. Ultra Flora IB once a day    . omeprazole (PRILOSEC) 20 MG capsule Take 40 mg by mouth daily.     . ondansetron (ZOFRAN) 4 MG tablet Take 4 mg by mouth every 6 (six) hours as needed for nausea or vomiting.    . potassium chloride SA (K-DUR,KLOR-CON) 20 MEQ tablet Take 2 tablets (40 mEq total) by mouth daily. 60 tablet 0  . traMADol (ULTRAM) 50 MG tablet Take 1 tablet (50 mg total) by mouth every 6 (six) hours as needed for moderate pain. 60 tablet 0  . trimethoprim (TRIMPEX) 100 MG tablet Take 100 mg by mouth daily.     No current facility-administered medications for this visit.    Allergies:   Quinidine; Tiazac; Warfarin sodium; Percocet; Promethazine hcl; Amiodarone; Procainamide; Warfarin and related; Amoxicillin; Ciprofloxacin; Polocaine; and Septra    Social History:  The patient  reports that she has never smoked. She has never used smokeless tobacco. She reports that she does not drink alcohol or use illicit drugs.   Family History:  The patient's family history includes Bladder Cancer in her father; Heart disease in her mother; Hypertension in her brother, brother, brother, mother, and sister.    ROS:  Please see the history of present illness.   Otherwise, review of systems are positive for  Cough, leg swelling, palpitations, and difficulty with balance and walking..   All other systems are reviewed and negative.    PHYSICAL EXAM: VS:  BP 106/62 mmHg  Pulse 74  Ht 5\' 2"  (1.575 m)  Wt 55.248 kg (121 lb 12.8 oz)  BMI 22.27 kg/m2  SpO2 84% , BMI Body mass index is 22.27 kg/(m^2). GEN: Well nourished, well developed, in no acute distress.  Pale and frail. HEENT: normal Neck: no JVD, carotid bruits, or masses Cardiac: IIRR.  There is no murmur, rub, or gallop. There is  1+ bilateral ankle edema. Respiratory:  clear to  auscultation bilaterally, normal work of breathing. GI: soft, nontender, nondistended, + BS MS: no deformity or atrophy Skin: warm and dry, no rash Neuro:  Strength and sensation are intact Psych: euthymic mood, full affect   EKG:  EKG  Is not ordered today.   Recent Labs: 07/12/2014: ALT 13 07/14/2014: TSH 0.670 07/15/2014: B Natriuretic Peptide 359.3* 07/19/2014: Magnesium 2.4 10/17/2014: Hemoglobin 13.0; Platelets 321 03/09/2015: BUN 46*; Creat 2.27*; Potassium 3.3*; Sodium 138    Lipid Panel No results found for: CHOL, TRIG, HDL, CHOLHDL, VLDL, LDLCALC, LDLDIRECT    Wt Readings from Last 3 Encounters:  03/18/15 55.248 kg (121 lb 12.8 oz)  08/27/14 55.248 kg (121 lb 12.8  oz)  08/27/14 55.248 kg (121 lb 12.8 oz)      Other studies Reviewed: Additional studies/ records that were reviewed today include:   Recent labs.. The findings include  Review laboratory data from late October. Kidney function has deteriorated. Creatinine is 2.27..    ASSESSMENT AND PLAN:  1. Tachycardia-bradycardia syndrome (HCC) Stable  2. Chronic diastolic heart failure (HCC)  Significant hypoxia  3. Chronic kidney disease, stage III (moderate) progressive  4. Essential hypertension  review data from the patient's daughter who is a Designer, jewellery. Mid to late morning blood pressures at the medications can be as low as 100 15 millimeters mercury systolic. Early morning blood pressures or in the 1:30 to 140 systolic range.  5. Chronic atrial fibrillation (HCC) Controlled rate  6. Pacemaker Being followed in device clinic    Current medicines are reviewed at length with the patient today.  The patient has the following concerns regarding medicines:  Daughters wonder if diuretic regimen should be more aggressive given hypoxia..  The following changes/actions have been instituted:     increase metolazone to dosing on Monday , Wednesday , and Friday x 3 doses.   Basic metabolic panel in one  week   discontinue amlodipine during this period of diuresis   Return to see me in 6 months   Metolazone dose after this intensified regimen will be determined after evaluation of the above blood work in one week.  Directions Will begin them by phone. Daughters will continue to collect blood pressure , oximetry, and heart rate data.  Labs/ tests ordered today include:   Orders Placed This Encounter  Procedures  . Basic metabolic panel     Disposition:   FU with HS in 6 months  Signed, Lesleigh Noe, MD  03/18/2015 6:32 PM    [facility] [address] [place], [place], [place]  [address] Phone: [phone]; Fax: [phone]

## 2015-03-20 ENCOUNTER — Other Ambulatory Visit: Payer: Self-pay | Admitting: *Deleted

## 2015-03-25 ENCOUNTER — Other Ambulatory Visit: Payer: Self-pay

## 2015-03-25 MED ORDER — POTASSIUM CHLORIDE CRYS ER 20 MEQ PO TBCR: 40.0000 meq | EXTENDED_RELEASE_TABLET | Freq: Every day | ORAL | Status: DC

## 2015-03-26 ENCOUNTER — Other Ambulatory Visit (INDEPENDENT_AMBULATORY_CARE_PROVIDER_SITE_OTHER): Payer: MEDICARE | Admitting: *Deleted

## 2015-03-26 DIAGNOSIS — I5032 Chronic diastolic (congestive) heart failure: Secondary | ICD-10-CM

## 2015-03-26 LAB — BASIC METABOLIC PANEL
BUN: 43 mg/dL — ABNORMAL HIGH (ref 7–25)
CHLORIDE: 99 mmol/L (ref 98–110)
CO2: 24 mmol/L (ref 20–31)
Calcium: 10 mg/dL (ref 8.6–10.4)
Creat: 1.89 mg/dL — ABNORMAL HIGH (ref 0.60–0.88)
GLUCOSE: 116 mg/dL — AB (ref 65–99)
POTASSIUM: 3.1 mmol/L — AB (ref 3.5–5.3)
Sodium: 139 mmol/L (ref 135–146)

## 2015-03-27 ENCOUNTER — Telehealth: Payer: Self-pay | Admitting: Interventional Cardiology

## 2015-03-27 DIAGNOSIS — E876 Hypokalemia: Secondary | ICD-10-CM

## 2015-03-27 MED ORDER — POTASSIUM CHLORIDE CRYS ER 20 MEQ PO TBCR: EXTENDED_RELEASE_TABLET | ORAL | Status: AC

## 2015-03-27 NOTE — Telephone Encounter (Signed)
Spoke with pt's daughter and reviewed lab results and instructions from Dr. Katrinka BlazingSmith with her.  Will send prescription to [facility] on [address].  Pt will come in for lab work on 11/22.

## 2015-03-27 NOTE — Telephone Encounter (Signed)
New Message  Pt daughter calling about pt's lab results. Please call back and discuss.

## 2015-03-31 ENCOUNTER — Other Ambulatory Visit (INDEPENDENT_AMBULATORY_CARE_PROVIDER_SITE_OTHER): Payer: MEDICARE

## 2015-03-31 DIAGNOSIS — E876 Hypokalemia: Secondary | ICD-10-CM | POA: Diagnosis not present

## 2015-03-31 LAB — BASIC METABOLIC PANEL
BUN: 48 mg/dL — AB (ref 7–25)
CHLORIDE: 102 mmol/L (ref 98–110)
CO2: 25 mmol/L (ref 20–31)
Calcium: 10.1 mg/dL (ref 8.6–10.4)
Creat: 2.19 mg/dL — ABNORMAL HIGH (ref 0.60–0.88)
GLUCOSE: 89 mg/dL (ref 65–99)
POTASSIUM: 4.8 mmol/L (ref 3.5–5.3)
Sodium: 140 mmol/L (ref 135–146)

## 2015-04-08 ENCOUNTER — Telehealth: Payer: Self-pay | Admitting: Interventional Cardiology

## 2015-04-08 NOTE — Telephone Encounter (Signed)
[facility] Dr.Smith will be back Friday in office to sign

## 2015-04-09 DEATH — deceased

## 2015-04-10 ENCOUNTER — Telehealth: Payer: Self-pay | Admitting: Interventional Cardiology

## 2015-04-10 NOTE — Telephone Encounter (Signed)
[facility] she is aware d/c ready for pick up.

## 2015-04-10 NOTE — Telephone Encounter (Signed)
D/C picked up.  °

## 2015-04-16 ENCOUNTER — Telehealth: Payer: Self-pay | Admitting: Family Medicine

## 2015-04-16 NOTE — Telephone Encounter (Signed)
Sympathy card sent
